# Patient Record
Sex: Male | Born: 1965 | Race: White | Hispanic: No | Marital: Married | Smoking: Current every day smoker
Health system: Southern US, Community
[De-identification: ages and names within clinical notes are randomized; demographics above are authoritative.]

## PROBLEM LIST (undated history)

## (undated) DIAGNOSIS — I10 Essential (primary) hypertension: Secondary | ICD-10-CM

## (undated) DIAGNOSIS — M1990 Unspecified osteoarthritis, unspecified site: Secondary | ICD-10-CM

## (undated) DIAGNOSIS — E785 Hyperlipidemia, unspecified: Secondary | ICD-10-CM

## (undated) DIAGNOSIS — E079 Disorder of thyroid, unspecified: Secondary | ICD-10-CM

## (undated) DIAGNOSIS — E039 Hypothyroidism, unspecified: Secondary | ICD-10-CM

## (undated) DIAGNOSIS — E119 Type 2 diabetes mellitus without complications: Secondary | ICD-10-CM

## (undated) DIAGNOSIS — I639 Cerebral infarction, unspecified: Secondary | ICD-10-CM

## (undated) DIAGNOSIS — M109 Gout, unspecified: Secondary | ICD-10-CM

## (undated) DIAGNOSIS — R270 Ataxia, unspecified: Secondary | ICD-10-CM

## (undated) DIAGNOSIS — G4700 Insomnia, unspecified: Secondary | ICD-10-CM

## (undated) DIAGNOSIS — J189 Pneumonia, unspecified organism: Secondary | ICD-10-CM

## (undated) DIAGNOSIS — F419 Anxiety disorder, unspecified: Secondary | ICD-10-CM

## (undated) DIAGNOSIS — C8590 Non-Hodgkin lymphoma, unspecified, unspecified site: Secondary | ICD-10-CM

## (undated) HISTORY — DX: Hyperlipidemia, unspecified: E78.5

## (undated) HISTORY — PX: WRIST SURGERY: SHX841

## (undated) HISTORY — PX: WISDOM TOOTH EXTRACTION: SHX21

## (undated) HISTORY — PX: NASAL SINUS SURGERY: SHX719

## (undated) HISTORY — PX: COLONOSCOPY: SHX174

## (undated) HISTORY — DX: Insomnia, unspecified: G47.00

## (undated) HISTORY — DX: Essential (primary) hypertension: I10

---

## 2000-08-08 ENCOUNTER — Encounter (INDEPENDENT_AMBULATORY_CARE_PROVIDER_SITE_OTHER): Payer: Self-pay

## 2000-08-08 ENCOUNTER — Other Ambulatory Visit: Admission: RE | Admit: 2000-08-08 | Discharge: 2000-08-08 | Payer: Self-pay | Admitting: Gastroenterology

## 2006-05-02 ENCOUNTER — Ambulatory Visit: Payer: Self-pay | Admitting: Gastroenterology

## 2006-06-12 ENCOUNTER — Ambulatory Visit: Payer: Self-pay | Admitting: Gastroenterology

## 2006-06-13 ENCOUNTER — Ambulatory Visit: Payer: Self-pay | Admitting: Gastroenterology

## 2006-06-13 ENCOUNTER — Encounter: Payer: Self-pay | Admitting: Gastroenterology

## 2008-12-05 ENCOUNTER — Ambulatory Visit: Payer: Self-pay | Admitting: Internal Medicine

## 2008-12-05 DIAGNOSIS — R05 Cough: Secondary | ICD-10-CM

## 2008-12-05 DIAGNOSIS — T7840XA Allergy, unspecified, initial encounter: Secondary | ICD-10-CM | POA: Insufficient documentation

## 2008-12-05 DIAGNOSIS — R079 Chest pain, unspecified: Secondary | ICD-10-CM | POA: Insufficient documentation

## 2008-12-05 DIAGNOSIS — J45901 Unspecified asthma with (acute) exacerbation: Secondary | ICD-10-CM | POA: Insufficient documentation

## 2008-12-05 DIAGNOSIS — Z9189 Other specified personal risk factors, not elsewhere classified: Secondary | ICD-10-CM | POA: Insufficient documentation

## 2008-12-05 DIAGNOSIS — R059 Cough, unspecified: Secondary | ICD-10-CM | POA: Insufficient documentation

## 2008-12-09 ENCOUNTER — Telehealth (INDEPENDENT_AMBULATORY_CARE_PROVIDER_SITE_OTHER): Payer: Self-pay | Admitting: *Deleted

## 2009-01-16 ENCOUNTER — Telehealth (INDEPENDENT_AMBULATORY_CARE_PROVIDER_SITE_OTHER): Payer: Self-pay | Admitting: *Deleted

## 2009-02-25 ENCOUNTER — Telehealth (INDEPENDENT_AMBULATORY_CARE_PROVIDER_SITE_OTHER): Payer: Self-pay | Admitting: *Deleted

## 2009-06-10 ENCOUNTER — Telehealth: Payer: Self-pay | Admitting: Internal Medicine

## 2009-08-04 ENCOUNTER — Telehealth (INDEPENDENT_AMBULATORY_CARE_PROVIDER_SITE_OTHER): Payer: Self-pay | Admitting: *Deleted

## 2011-03-11 NOTE — Assessment & Plan Note (Signed)
[facility]                           GASTROENTEROLOGY OFFICE NOTE   [name], [name]                            MRN:          [id number]  DATE:06/12/2006                            DOB:          21-Feb-1966    PRIMARY CARE PHYSICIAN:  Dr. Cleda Clarks from [facility].   GASTROINTESTINAL PROBLEM LIST:  1. Family history of colon cancer.  Father diagnosed in his 77s.  The      patient has never had colonoscopy.  2. Recent odynophagia, diagnosed as thrush.  Was put on 6 days of      fluconazole with good relief of symptoms, although still some mild      discomfort.   INTERVAL HISTORY:  I last saw Dustin Sampson about 6 weeks ago.  At that time, he had  an acute serum sickness type illness with extreme fatigue, joint pain,  swelling in his extremities.  It was not clear to me whether this was simply  related to a virus or possibly to antibiotics he had been put on.  Either  way his big symptoms did improve shortly after stopping the Avelox.  About 2-  3 weeks ago, though, he started having a sore throat and presented to one of  his caregivers who gave him a Z-Pak.  Shortly after that, he started  noticing more and more swallowing discomfort and throat pain.  He went to  see Dr. Cleda Clarks, who diagnosed him with having oral thrush who put him on 6  days of fluconazole which dramatically helped his swallowing, but he did  notice in the past week or so that he sees a couple white spots again.  He  started taking yogurt, and his symptoms have not worsened since then.  He  has arranged to see a rheumatologist to discuss with him his recent joint  pains plus his history of gout.   CURRENT MEDICATIONS:  1. Restoril.  2. Synthroid.  3. A cholesterol medicine.   PHYSICAL EXAMINATION:  VITAL SIGNS:  Weight is 196 pounds, which is down 15  pounds since his last visit.  Blood pressure of 126/64, pulse 76.  CONSTITUTIONAL:  Generally, well appearing.  LUNGS:  Clear  to auscultation bilaterally.  HEART:  Regular rate and rhythm.  ABDOMEN:  Soft, nontender, nondistended, with normal bowel sounds.  HEENT:  Oropharynx with minor erythema in the posterior oropharynx.  I see  no definite evidence of thrush.   ASSESSMENT AND PLAN:  A 45 year old man with mild odynophagia, family  history for colon cancer.   I do think that his acute illness recently was either viral related or  perhaps related to some antibiotics that he had taken.  He certainly had  thrush, which was very most likely antibiotic related.  I have recommended  he be very cautious about future antibiotic use, unless his care Jerricka Carvey is  relatively certain that he has a bacterial infection.  Currently, he still  does have minor odynophagia without any definite evidence of thrush in his  oropharynx.  I think proceeding with EGD at his soonest  convenience is  reasonable just to see if he has esophageal candidiasis.  He also has a  family history for colon cancer, so at the same time we will do a full  colonoscopy.  I see no reason for any other blood tests or imaging studies  prior to then.                                   Rachael Fee, MD   DPJ/MedQ  DD:  06/12/2006  DT:  06/12/2006  Job #:  [id number]   cc:   Dr. Cleda Clarks

## 2011-03-11 NOTE — Assessment & Plan Note (Signed)
[facility]                           GASTROENTEROLOGY OFFICE NOTE   ZARIUS, FURR                            MRN:          [id number]  DATE:05/02/2006                            DOB:          1966/03/07    REFERRING PHYSICIAN:  RICHARD A KEEVER   REFERRING PHYSICIAN:  Dr. Cleda Clarks from [facility].   REASON FOR REFERRAL:  Dr. Cleda Clarks asked me to evaluate Mr. Givler in  consultation regarding nausea, vomiting, chills, fatigue.   HISTORY OF PRESENT ILLNESS:  Mr. Dorn is a very pleasant 45 year old man  who has had multiple new symptoms develop over the past 2 weeks.  He was at  a funeral in  approximately 2 weeks ago and had a sore throat  and began feeling achy and quite fatigued.  Following that, he went to  [facility] with his wife and he started noticing some right upper quadrant  discomfort, chills and fatigue and then had 1 day of dramatic diarrhea.  He  went to see a local physician there in [place] and was given Avelox  empirically for what he was told was possibly bronchitis.  He was coughing a  bit at the time, but nothing dramatic.  Since starting the Avelox, he was  nauseated and he noticed his hands and feet swelling a lot.  He stopped the  Avelox after 6 days and saw his caregiver at [facility], who  did some lab tests on him of which the CBC shows his hemoglobin is 12.5,  normal white count, no elevated lymphocytes, normal platelets.   His biggest complaints now are his hands and feet swelling, his joint pains  and his fatigue.  He thinks since stopping the Avelox that his nausea has  improved and his hands and feet maybe are a bit less swollen today.   Of interest, he had a Staph aureus skin infection at his left ear about a  month ago and he completed 10 days of 2 different antibiotics.   REVIEW OF SYSTEMS:  Review of systems is notable for stable weight, but  otherwise is  unrevealing, except as noted above and as available on his  nursing intake sheet.   PAST MEDICAL HISTORY:  1.  Status post colonoscopy, 2001, hyperplastic polyps taken; this was done      for a family history of colon cancer (his father).  2.  Hiatal hernia.  3.  Chronic headaches.  4.  Chronic sinus disease.  5.  Recent left ear Staph aureus infection.   CURRENT MEDICINES:  Restoril.   ALLERGIES:  PENICILLIN and ASPIRIN.   SOCIAL HISTORY:  Married with 6 kids, a 27-year-old and a 41-year-old, works  as a Comptroller at [employer], smokes 1-1/2 packs a day, nondrinker.   FAMILY HISTORY:  Father diagnosed with colon cancer in his 58s.  Mother with  ulcerative colitis.  Father and sister with celiac disease.   PHYSICAL EXAMINATION:  VITAL SIGNS:  Six feet 0 inches, 209 pounds.  Blood  pressure 132/72, pulse 80.  CONSTITUTIONAL:  Generally  well-appearing.  NEUROLOGIC:  Alert and oriented x3.  EYES:  Extraocular movements intact.  MOUTH:  Oropharynx moist.  No lesions.  NECK:  Supple with no lymphadenopathy.  CARDIOVASCULAR:  Heart -- regular rate and rhythm.  LUNGS:  Clear to auscultation bilaterally.  ABDOMEN:  Soft, nontender and non-distended.  Normal bowel sounds.  EXTREMITIES:  No lower extremity edema.  SKIN:  No rash or lesions of his lower extremities.   ASSESSMENT AND PLAN:  [age]-year-old man with multiple symptoms.   I suspect that Mr. Garofano has had a viral illness and many of his symptoms  are probably related to that.  Also, he was given empiric antibiotics for  bronchitis during this course and that probably contributed to his nausea or  perhaps caused it completely.  His wife had similar symptoms starting around  the same time as him, but she resolved after just 4 or 5 days.  Neither of  his kids have gotten sick.  I have a CBC available from yesterday's  laboratories showing that his white count was normal; his hemoglobin was  12.5, which is down from a test  done last year, but is still in the normal  range.  He has no signs of overt gastrointestinal bleeding.   I recommend that he begin taking Extra-Strength Tylenol two pills twice  daily to help with his joint pains.  He is going to get a complete metabolic  profile done today as well as celiac sprue test done; I doubt that this is  acute celiac sprue disease, but will check just to be sure.  He is due for  screening colonoscopy, since his father had colon cancer.  I do not think  right now is the best time for this, since he has this acute illness, but he  will return to see me in 2 weeks' time, sooner if needed.  If he is feeling  better then, then we will readdress the issue of screening colonoscopy and  arrange for that to be done as soon as convenient.  I am also going to get  stool studies done today, since he was on antibiotics about a month ago and  even more  recently and he has had some diarrhea during this, although his diarrhea  does not sound very dramatic; he has had no nocturnal symptoms.                                  Rachael Fee, MD   DPJ/MedQ  DD:  05/02/2006  DT:  05/03/2006  Job #:  [id number]   cc:   [facility] Keever, Richard A. MD

## 2011-06-23 ENCOUNTER — Encounter: Payer: Self-pay | Admitting: Gastroenterology

## 2011-07-06 ENCOUNTER — Emergency Department (HOSPITAL_COMMUNITY): Payer: No Typology Code available for payment source

## 2011-07-06 ENCOUNTER — Emergency Department (HOSPITAL_COMMUNITY)
Admission: EM | Admit: 2011-07-06 | Discharge: 2011-07-06 | Disposition: A | Discharge status: Home / Self Care | Payer: No Typology Code available for payment source | Attending: Emergency Medicine | Admitting: Emergency Medicine

## 2011-07-06 DIAGNOSIS — R51 Headache: Secondary | ICD-10-CM | POA: Insufficient documentation

## 2011-07-06 DIAGNOSIS — E039 Hypothyroidism, unspecified: Secondary | ICD-10-CM | POA: Insufficient documentation

## 2011-07-06 DIAGNOSIS — R0789 Other chest pain: Secondary | ICD-10-CM | POA: Insufficient documentation

## 2011-07-06 DIAGNOSIS — F29 Unspecified psychosis not due to a substance or known physiological condition: Secondary | ICD-10-CM | POA: Insufficient documentation

## 2011-07-06 DIAGNOSIS — Z79899 Other long term (current) drug therapy: Secondary | ICD-10-CM | POA: Insufficient documentation

## 2011-07-06 DIAGNOSIS — S0990XA Unspecified injury of head, initial encounter: Secondary | ICD-10-CM | POA: Insufficient documentation

## 2012-07-26 ENCOUNTER — Encounter: Payer: Self-pay | Admitting: Gastroenterology

## 2014-01-21 ENCOUNTER — Other Ambulatory Visit: Payer: Self-pay | Admitting: Otolaryngology

## 2014-11-20 ENCOUNTER — Other Ambulatory Visit: Payer: Self-pay | Admitting: Family Medicine

## 2014-11-20 ENCOUNTER — Encounter: Payer: Self-pay | Admitting: Gastroenterology

## 2014-11-20 DIAGNOSIS — R198 Other specified symptoms and signs involving the digestive system and abdomen: Secondary | ICD-10-CM

## 2014-12-05 ENCOUNTER — Ambulatory Visit
Admission: RE | Admit: 2014-12-05 | Discharge: 2014-12-05 | Disposition: A | Discharge status: Home / Self Care | Payer: 59 | Source: Ambulatory Visit | Attending: Family Medicine | Admitting: Family Medicine

## 2014-12-05 DIAGNOSIS — R198 Other specified symptoms and signs involving the digestive system and abdomen: Secondary | ICD-10-CM

## 2014-12-25 ENCOUNTER — Ambulatory Visit (AMBULATORY_SURGERY_CENTER): Payer: Self-pay

## 2014-12-25 VITALS — Ht 72.0 in | Wt 221.0 lb

## 2014-12-25 DIAGNOSIS — Z860100 Personal history of colon polyps, unspecified: Secondary | ICD-10-CM

## 2014-12-25 DIAGNOSIS — Z800 Family history of malignant neoplasm of digestive organs: Secondary | ICD-10-CM

## 2014-12-25 MED ORDER — MOVIPREP 100 G PO SOLR: 1.0000 | Freq: Once | ORAL | Status: DC

## 2014-12-25 NOTE — Progress Notes (Signed)
No allergies to eggs or soy No diet/weight loss meds No home oxygen No past problems with anesthesia  Has email  Emmi instructions given for colonoscopy 

## 2015-01-07 ENCOUNTER — Ambulatory Visit (AMBULATORY_SURGERY_CENTER): Payer: 59 | Admitting: Gastroenterology

## 2015-01-07 ENCOUNTER — Encounter: Payer: Self-pay | Admitting: Gastroenterology

## 2015-01-07 VITALS — BP 134/85 | HR 75 | Temp 97.4°F | Resp 21 | Ht 72.0 in | Wt 221.0 lb

## 2015-01-07 DIAGNOSIS — D122 Benign neoplasm of ascending colon: Secondary | ICD-10-CM | POA: Diagnosis not present

## 2015-01-07 DIAGNOSIS — Z800 Family history of malignant neoplasm of digestive organs: Secondary | ICD-10-CM

## 2015-01-07 DIAGNOSIS — Z1211 Encounter for screening for malignant neoplasm of colon: Secondary | ICD-10-CM

## 2015-01-07 DIAGNOSIS — Z86010 Personal history of colonic polyps: Secondary | ICD-10-CM

## 2015-01-07 MED ORDER — SODIUM CHLORIDE 0.9 % IV SOLN: 500.0000 mL | INTRAVENOUS | Status: DC

## 2015-01-07 NOTE — Progress Notes (Signed)
Called to room to assist during endoscopic procedure.  Patient ID and intended procedure confirmed with present staff. Received instructions for my participation in the procedure from the performing physician.  

## 2015-01-07 NOTE — Patient Instructions (Signed)
YOU HAD AN ENDOSCOPIC PROCEDURE TODAY AT [facility]:   Refer to the procedure report that was given to you for any specific questions about what was found during the examination.  If the procedure report does not answer your questions, please call your gastroenterologist to clarify.  If you requested that your care partner not be given the details of your procedure findings, then the procedure report has been included in a sealed envelope for you to review at your convenience later.  YOU SHOULD EXPECT: Some feelings of bloating in the abdomen. Passage of more gas than usual.  Walking can help get rid of the air that was put into your GI tract during the procedure and reduce the bloating. If you had a lower endoscopy (such as a colonoscopy or flexible sigmoidoscopy) you may notice spotting of blood in your stool or on the toilet paper. If you underwent a bowel prep for your procedure, you may not have a normal bowel movement for a few days.  Please Note:  You might notice some irritation and congestion in your nose or some drainage.  This is from the oxygen used during your procedure.  There is no need for concern and it should clear up in a day or so.  SYMPTOMS TO REPORT IMMEDIATELY:   Following lower endoscopy (colonoscopy or flexible sigmoidoscopy):  Excessive amounts of blood in the stool  Significant tenderness or worsening of abdominal pains  Swelling of the abdomen that is new, acute  Fever of 100F or higher    For urgent or emergent issues, a gastroenterologist can be reached at any hour by calling [phone].   DIET: Your first meal following the procedure should be a small meal and then it is ok to progress to your normal diet. Heavy or fried foods are harder to digest and may make you feel nauseous or bloated.  Likewise, meals heavy in dairy and vegetables can increase bloating.  Drink plenty of fluids but you should avoid alcoholic beverages for 24  hours.  ACTIVITY:  You should plan to take it easy for the rest of today and you should NOT DRIVE or use heavy machinery until tomorrow (because of the sedation medicines used during the test).    FOLLOW UP: Our staff will call the number listed on your records the next business day following your procedure to check on you and address any questions or concerns that you may have regarding the information given to you following your procedure. If we do not reach you, we will leave a message.  However, if you are feeling well and you are not experiencing any problems, there is no need to return our call.  We will assume that you have returned to your regular daily activities without incident.   Polyp information given.  If any biopsies were taken you will be contacted by phone or by letter within the next 1-3 weeks.  Please call us at [phone] if you have not heard about the biopsies in 3 weeks.    SIGNATURES/CONFIDENTIALITY: You and/or your care partner have signed paperwork which will be entered into your electronic medical record.  These signatures attest to the fact that that the information above on your After Visit Summary has been reviewed and is understood.  Full responsibility of the confidentiality of this discharge information lies with you and/or your care-partner.

## 2015-01-07 NOTE — Op Note (Signed)
[facility]  [address] [place], [id number]   COLONOSCOPY PROCEDURE REPORT  PATIENT: Dustin Sampson, Dustin Sampson  MR#: [id number] BIRTHDATE: [birth year] , 48  yrs. old GENDER: male ENDOSCOPIST: Milus Banister, MD PROCEDURE DATE:  01/07/2015 PROCEDURE:   Colonoscopy with snare polypectomy First Screening Colonoscopy - Avg.  risk and is 50 yrs.  old or older - No.  Prior Negative Screening - Now for repeat screening. N/A  History of Adenoma - Now for follow-up colonoscopy & has been > or = to 3 yrs.  N/A ASA CLASS:   Class II INDICATIONS:Screening for colonic neoplasia and FH Colon or Rectal Adenocarcinoma (father had colon cancer in his 54's) MEDICATIONS: Monitored anesthesia care and Propofol 400 mg IV  DESCRIPTION OF PROCEDURE:   After the risks benefits and alternatives of the procedure were thoroughly explained, informed consent was obtained.  The digital rectal exam revealed no abnormalities of the rectum.   The LB [id number]  endoscope was introduced through the anus and advanced to the cecum, which was identified by both the appendix and ileocecal valve. No adverse events experienced.   The quality of the prep was excellent.  The instrument was then slowly withdrawn as the colon was fully examined.   COLON FINDINGS: Two sessile polyps ranging between 3-9mm in size were found in the ascending colon.  Polypectomies were performed with cold forceps.  The resection was complete, the polyp tissue was completely retrieved and sent to histology.   The examination was otherwise normal.  Retroflexed views revealed no abnormalities. The time to cecum = 0.4 Withdrawal time = 8.4   The scope was withdrawn and the procedure completed. COMPLICATIONS: There were no immediate complications.  ENDOSCOPIC IMPRESSION: 1.   Two sessile polyps ranging between 3-47mm in size were found in the ascending colon; polypectomies were performed with cold forceps  2.   The examination was  otherwise normal  RECOMMENDATIONS: If the polyp(s) removed today are proven to be adenomatous (pre-cancerous) polyps, you will need a repeat colonoscopy in 5 years.  You will receive a letter within 1-2 weeks with the results of your biopsy as well as final recommendations.  Please call my office if you have not received a letter after 3 weeks.  eSigned:  Milus Banister, MD 01/07/2015 10:54 AM   cc: Kathyrn Lass, MD

## 2015-01-07 NOTE — Progress Notes (Signed)
A/ox3 pleased with MAC, report to Jane RN 

## 2015-01-08 ENCOUNTER — Telehealth: Payer: Self-pay | Admitting: *Deleted

## 2015-01-08 NOTE — Telephone Encounter (Signed)
No answer, message left for the patient. 

## 2015-01-13 ENCOUNTER — Encounter: Payer: Self-pay | Admitting: Gastroenterology

## 2015-10-25 ENCOUNTER — Emergency Department (HOSPITAL_COMMUNITY)
Admission: EM | Admit: 2015-10-25 | Discharge: 2015-10-25 | Disposition: A | Discharge status: Home / Self Care | Payer: 59 | Source: Home / Self Care

## 2015-10-25 ENCOUNTER — Encounter (HOSPITAL_COMMUNITY): Payer: Self-pay | Admitting: *Deleted

## 2015-10-25 DIAGNOSIS — J40 Bronchitis, not specified as acute or chronic: Secondary | ICD-10-CM

## 2015-10-25 HISTORY — DX: Disorder of thyroid, unspecified: E07.9

## 2015-10-25 MED ORDER — AZITHROMYCIN 250 MG PO TABS: ORAL_TABLET | ORAL | Status: DC

## 2015-10-25 MED ORDER — PREDNISONE 10 MG PO TABS: 20.0000 mg | ORAL_TABLET | Freq: Every day | ORAL | Status: DC

## 2015-10-25 NOTE — ED Provider Notes (Signed)
CSN: [id number]     Arrival date & time 10/25/15  1549 History   None    Chief Complaint  Patient presents with  . Cough  . Fever   (Consider location/radiation/quality/duration/timing/severity/associated sxs/prior Treatment) HPI Cough, dry hacking now for 1 week. Not getting better. Now with wheezing. No associated fever. OTC meds not helping no pain score Exposed to sick co-workers.  Past Medical History  Diagnosis Date  . Hypertension   . Hyperlipemia   . Insomnia   . Thyroid disease    Past Surgical History  Procedure Laterality Date  . Wisdom tooth extraction    . Colonoscopy      x2  . Nasal sinus surgery     Family History  Problem Relation Age of Onset  . Colon cancer Father    Social History  Substance Use Topics  . Smoking status: Current Every Day Smoker -- 0.50 packs/day    Types: Cigarettes  . Smokeless tobacco: Former Systems developer  . Alcohol Use: No    Review of Systems ROS +'ve cough, wheezing  Denies: HEADACHE, NAUSEA, ABDOMINAL PAIN, CHEST PAIN, CONGESTION, DYSURIA, SHORTNESS OF BREATH  Allergies  Aspirin; Avelox; and Penicillins  Home Medications   Prior to Admission medications   Medication Sig Start Date End Date Taking? Authorizing Provider  atorvastatin (LIPITOR) 10 MG tablet Take 10 mg by mouth daily.   Yes Historical Provider, MD  levothyroxine (SYNTHROID, LEVOTHROID) 200 MCG tablet Take 200 mcg by mouth daily before breakfast.   Yes Historical Provider, MD  losartan (COZAAR) 50 MG tablet Take 50 mg by mouth daily.   Yes Historical Provider, MD  temazepam (RESTORIL) 30 MG capsule Take 30 mg by mouth at bedtime as needed for sleep.   Yes Historical Provider, MD  azithromycin (ZITHROMAX) 250 MG tablet Take first 2 tablets together, then 1 every day until finished. 10/25/15   Konrad Felix, PA  predniSONE (DELTASONE) 10 MG tablet Take 2 tablets (20 mg total) by mouth daily. 10/25/15   Konrad Felix, PA   Meds Ordered and Administered this Visit   Medications - No data to display  BP 146/96 mmHg  Pulse 95  Temp(Src) 97.6 F (36.4 C) (Oral)  Resp 18  SpO2 98% No data found.   Physical Exam NURSES NOTES AND VITAL SIGNS REVIEWED. CONSTITUTIONAL: Well developed, well nourished, no acute distress HEENT: normocephalic, atraumatic EYES: Conjunctiva normal NECK:normal ROM, supple PULMONARY:No respiratory distress, normal effort, Lungs: CTAb/l very few diffuse wheezes CARDIOVASCULAR: RRR, no murmur ABDOMEN: soft, ND, NT, +'ve BS MUSCULOSKELETAL: Normal ROM of all extremities SKIN: warm and dry without rash PSYCHIATRIC: Mood and affect normal  ED Course  Procedures (including critical care time)  Labs Review Labs Reviewed - No data to display  Imaging Review No results found.   Visual Acuity Review  Right Eye Distance:   Left Eye Distance:   Bilateral Distance:    Right Eye Near:   Left Eye Near:    Bilateral Near:         MDM   1. Bronchitis    Patient is advised to continue home symptomatic treatment. Prescription for zithromax  sent pharmacy patient has indicated. Patient is advised that if there are new or worsening symptoms or attend the emergency department, or contact primary care provider. Instructions of care provided discharged home in stable condition.  THIS NOTE WAS GENERATED USING A VOICE RECOGNITION SOFTWARE PROGRAM. ALL REASONABLE EFFORTS  WERE MADE TO PROOFREAD THIS DOCUMENT FOR ACCURACY.  Konrad Felix, [place] 10/25/15 1910

## 2015-10-25 NOTE — ED Notes (Signed)
C/O "bad cough", chest pain with coughing, chills and feeling feverish x 9 days with progressive worsening.  Has been taking Mucinex and Nyquil.

## 2015-10-25 NOTE — Discharge Instructions (Signed)
Upper Respiratory Infection, Adult Most upper respiratory infections (URIs) are caused by a virus. A URI affects the nose, throat, and upper air passages. The most common type of URI is often called "the common cold." HOME CARE   Take medicines only as told by your doctor.  Gargle warm saltwater or take cough drops to comfort your throat as told by your doctor.  Use a warm mist humidifier or inhale steam from a shower to increase air moisture. This may make it easier to breathe.  Drink enough fluid to keep your pee (urine) clear or pale yellow.  Eat soups and other clear broths.  Have a healthy diet.  Rest as needed.  Go back to work when your fever is gone or your doctor says it is okay.  You may need to stay home longer to avoid giving your URI to others.  You can also wear a face mask and wash your hands often to prevent spread of the virus.  Use your inhaler more if you have asthma.  Do not use any tobacco products, including cigarettes, chewing tobacco, or electronic cigarettes. If you need help quitting, ask your doctor. GET HELP IF:  You are getting worse, not better.  Your symptoms are not helped by medicine.  You have chills.  You are getting more short of breath.  You have brown or red mucus.  You have yellow or brown discharge from your nose.  You have pain in your face, especially when you bend forward.  You have a fever.  You have puffy (swollen) neck glands.  You have pain while swallowing.  You have white areas in the back of your throat. GET HELP RIGHT AWAY IF:   You have very bad or constant:  Headache.  Ear pain.  Pain in your forehead, behind your eyes, and over your cheekbones (sinus pain).  Chest pain.  You have long-lasting (chronic) lung disease and any of the following:  Wheezing.  Long-lasting cough.  Coughing up blood.  A change in your usual mucus.  You have a stiff neck.  You have changes in  your:  Vision.  Hearing.  Thinking.  Mood. MAKE SURE YOU:   Understand these instructions.  Will watch your condition.  Will get help right away if you are not doing well or get worse.   This information is not intended to replace advice given to you by your health care provider. Make sure you discuss any questions you have with your health care provider.   Document Released: 03/28/2008 Document Revised: 02/24/2015 Document Reviewed: 01/15/2014 Elsevier Interactive Patient Education 2016 Elsevier Inc. Acute Bronchitis Bronchitis is when the airways that extend from the windpipe into the lungs get red, puffy, and painful (inflamed). Bronchitis often causes thick spit (mucus) to develop. This leads to a cough. A cough is the most common symptom of bronchitis. In acute bronchitis, the condition usually begins suddenly and goes away over time (usually in 2 weeks). Smoking, allergies, and asthma can make bronchitis worse. Repeated episodes of bronchitis may cause more lung problems. HOME CARE  Rest.  Drink enough fluids to keep your pee (urine) clear or pale yellow (unless you need to limit fluids as told by your doctor).  Only take over-the-counter or prescription medicines as told by your doctor.  Avoid smoking and secondhand smoke. These can make bronchitis worse. If you are a smoker, think about using nicotine gum or skin patches. Quitting smoking will help your lungs heal faster.  Reduce the  chance of getting bronchitis again by:  Washing your hands often.  Avoiding people with cold symptoms.  Trying not to touch your hands to your mouth, nose, or eyes.  Follow up with your doctor as told. GET HELP IF: Your symptoms do not improve after 1 week of treatment. Symptoms include:  Cough.  Fever.  Coughing up thick spit.  Body aches.  Chest congestion.  Chills.  Shortness of breath.  Sore throat. GET HELP RIGHT AWAY IF:   You have an increased fever.  You  have chills.  You have severe shortness of breath.  You have bloody thick spit (sputum).  You throw up (vomit) often.  You lose too much body fluid (dehydration).  You have a severe headache.  You faint. MAKE SURE YOU:   Understand these instructions.  Will watch your condition.  Will get help right away if you are not doing well or get worse.   This information is not intended to replace advice given to you by your health care provider. Make sure you discuss any questions you have with your health care provider.   Document Released: 03/28/2008 Document Revised: 06/12/2013 Document Reviewed: 04/02/2013 Elsevier Interactive Patient Education [employer].

## 2016-11-01 DIAGNOSIS — I10 Essential (primary) hypertension: Secondary | ICD-10-CM | POA: Diagnosis not present

## 2016-11-01 DIAGNOSIS — G4700 Insomnia, unspecified: Secondary | ICD-10-CM | POA: Diagnosis not present

## 2016-11-01 DIAGNOSIS — E039 Hypothyroidism, unspecified: Secondary | ICD-10-CM | POA: Diagnosis not present

## 2017-01-31 DIAGNOSIS — M10071 Idiopathic gout, right ankle and foot: Secondary | ICD-10-CM | POA: Diagnosis not present

## 2017-02-23 DIAGNOSIS — M79672 Pain in left foot: Secondary | ICD-10-CM | POA: Diagnosis not present

## 2017-02-23 DIAGNOSIS — E039 Hypothyroidism, unspecified: Secondary | ICD-10-CM | POA: Diagnosis not present

## 2017-02-23 DIAGNOSIS — K1379 Other lesions of oral mucosa: Secondary | ICD-10-CM | POA: Diagnosis not present

## 2017-03-03 DIAGNOSIS — M109 Gout, unspecified: Secondary | ICD-10-CM | POA: Diagnosis not present

## 2017-03-14 DIAGNOSIS — M109 Gout, unspecified: Secondary | ICD-10-CM | POA: Diagnosis not present

## 2017-03-21 DIAGNOSIS — M25571 Pain in right ankle and joints of right foot: Secondary | ICD-10-CM | POA: Diagnosis not present

## 2017-04-04 DIAGNOSIS — M109 Gout, unspecified: Secondary | ICD-10-CM | POA: Diagnosis not present

## 2017-05-16 DIAGNOSIS — R7303 Prediabetes: Secondary | ICD-10-CM | POA: Diagnosis not present

## 2017-05-16 DIAGNOSIS — E039 Hypothyroidism, unspecified: Secondary | ICD-10-CM | POA: Diagnosis not present

## 2017-05-16 DIAGNOSIS — R51 Headache: Secondary | ICD-10-CM | POA: Diagnosis not present

## 2017-05-16 DIAGNOSIS — M1A9XX0 Chronic gout, unspecified, without tophus (tophi): Secondary | ICD-10-CM | POA: Diagnosis not present

## 2017-07-13 DIAGNOSIS — M2550 Pain in unspecified joint: Secondary | ICD-10-CM | POA: Diagnosis not present

## 2017-07-13 DIAGNOSIS — M1A09X0 Idiopathic chronic gout, multiple sites, without tophus (tophi): Secondary | ICD-10-CM | POA: Diagnosis not present

## 2017-08-22 ENCOUNTER — Encounter: Payer: Self-pay | Admitting: Neurology

## 2017-08-23 ENCOUNTER — Ambulatory Visit (INDEPENDENT_AMBULATORY_CARE_PROVIDER_SITE_OTHER): Payer: 59 | Admitting: Neurology

## 2017-08-23 ENCOUNTER — Encounter: Payer: Self-pay | Admitting: Neurology

## 2017-08-23 VITALS — BP 150/100 | HR 94 | Ht 72.0 in | Wt 235.4 lb

## 2017-08-23 DIAGNOSIS — F17200 Nicotine dependence, unspecified, uncomplicated: Secondary | ICD-10-CM | POA: Diagnosis not present

## 2017-08-23 DIAGNOSIS — I10 Essential (primary) hypertension: Secondary | ICD-10-CM

## 2017-08-23 DIAGNOSIS — G4440 Drug-induced headache, not elsewhere classified, not intractable: Secondary | ICD-10-CM | POA: Diagnosis not present

## 2017-08-23 MED ORDER — NORTRIPTYLINE HCL 25 MG PO CAPS: 25.0000 mg | ORAL_CAPSULE | Freq: Every day | ORAL | 3 refills | Status: DC

## 2017-08-23 NOTE — Patient Instructions (Signed)
1.  We will taper off of Fioricet:  Week 1:  Take no more than 6 days out of the week  Week 2:  Take no more than 5 days out of the week  Week 3:  Take no more than 4 days out of the week  Week 4:  Take no more than 3 days out of the week  Week 5.  Take no more than 2 days out of the week  Week 6.  Take no more than 1 day out of the week  Week 7:  Stop all Fioricet 2.  Stop caffeine, Excedrin.   3.  Limit use of Advil to no more than 2 days out of the week 4.  Start nortriptyline 25mg  at bedtime.  Contact me in 4 weeks and we can increase dose if needed.  Otherwise you may refill the current dose. 5.  Increase exercise (at least 4 days out of the week), increase water intake, improve sleep hygiene, quit smoking 6. Follow up in 3 months.

## 2017-08-23 NOTE — Progress Notes (Signed)
NEUROLOGY CONSULTATION NOTE  Dustin Sampson MRN: [id number] DOB: 08/27/1966  Referring provider: Dr. Lynelle Doctor Primary care provider: Dr. Lynelle Doctor  Reason for consult:  headache  HISTORY OF PRESENT ILLNESS: Dustin Sampson is a 51 year old male with hypertension, hyperlipidemia and hypothyroidism who presents for headache.  He has had chronic headaches all of his life.  He reports history of migraines, tension type headache and "sinus" headaches.  About 2 years ago, he underwent sinus surgery to correct a deviated septum.  He currently reports a constant moderate pressure headache (sometimes throbbing) located above either eye (right more than left) and back of the head.  He has associated neck tightness.  It is constant.  On only rare occasions is there nausea.  Otherwise, no nausea, vomiting, photophobia, phonophobia or visual disturbance.  Alcohol is a trigger. Nothing really relieves it except for pain relievers.    He currently takes Fioricet twice daily (for 20 years).  He also takes Advil daily (to treat headache as well as joint pain).  He drinks caffeine every morning and will at times take Excedrin as well.  He also takes an intranasal decongestant.    He has a history of poor sleep and takes Restoril.  He does not feel fatigued during the day.  His wife says he does not snore much.  He is a cigarette smoker.  He does not drink alcohol.  He does not exercise routinely.  He says he hydrates with water during the day.    His mother had headaches.  He has never been on a daily preventative.  PAST MEDICAL HISTORY: Past Medical History:  Diagnosis Date  . Hyperlipemia   . Hypertension   . Insomnia   . Thyroid disease     PAST SURGICAL HISTORY: Past Surgical History:  Procedure Laterality Date  . COLONOSCOPY     x2  . NASAL SINUS SURGERY    . WISDOM TOOTH EXTRACTION      MEDICATIONS: Current Outpatient Prescriptions on File Prior to Visit  Medication Sig Dispense Refill  . atorvastatin  (LIPITOR) 10 MG tablet Take 10 mg by mouth daily.    Marland Kitchen azithromycin (ZITHROMAX) 250 MG tablet Take first 2 tablets together, then 1 every day until finished. (Patient not taking: Reported on 08/23/2017) 6 tablet 0  . levothyroxine (SYNTHROID, LEVOTHROID) 200 MCG tablet Take 200 mcg by mouth daily before breakfast.    . losartan (COZAAR) 50 MG tablet Take 50 mg by mouth daily.    . predniSONE (DELTASONE) 10 MG tablet Take 2 tablets (20 mg total) by mouth daily. 20 tablet 0  . temazepam (RESTORIL) 30 MG capsule Take 30 mg by mouth at bedtime as needed for sleep.     No current facility-administered medications on file prior to visit.     ALLERGIES: Allergies  Allergen Reactions  . 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 Avelox [Moxifloxacin Hcl In Nacl] Nausea And Vomiting    "made me real sick"  . Penicillins     REACTION: severe GI upset    FAMILY HISTORY: Family History  Problem Relation Age of Onset  . Colon cancer Father     SOCIAL HISTORY: Social History   Social History  . Marital status: Married    Spouse name: N/A  . Number of children: 2  . Years of education: BS   Occupational History  . Arts development officer    Social History Main Topics  . Smoking  status: Current Every Day Smoker    Packs/day: 0.50    Types: Cigarettes  . Smokeless tobacco: Former Systems developer  . Alcohol use No  . Drug use: No  . Sexual activity: Not on file   Other Topics Concern  . Not on file   Social History Narrative   Married, lives with wife and two teenage children. He and his sister alternate with their parents living with them every other month. His parents are in their upper 42's. He drinks 4-5 cups of coffee a day and 2-3 sodas or glasses of tea. He does not get any regular exercise.    REVIEW OF SYSTEMS: Constitutional: No fevers, chills, or sweats, no generalized fatigue, change in appetite Eyes: No visual changes, double vision, eye pain Ear, nose  and throat: No hearing loss, ear pain, nasal congestion, sore throat Cardiovascular: No chest pain, palpitations Respiratory:  No shortness of breath at rest or with exertion, wheezes GastrointestinaI: No nausea, vomiting, diarrhea, abdominal pain, fecal incontinence Genitourinary:  No dysuria, urinary retention or frequency Musculoskeletal:  No neck pain, back pain Integumentary: No rash, pruritus, skin lesions Neurological: as above Psychiatric: No depression, insomnia, anxiety Endocrine: No palpitations, fatigue, diaphoresis, mood swings, change in appetite, change in weight, increased thirst Hematologic/Lymphatic:  No purpura, petechiae. Allergic/Immunologic: no itchy/runny eyes, nasal congestion, recent allergic reactions, rashes  PHYSICAL EXAM: Vitals:   08/23/17 0917  BP: (!) 150/100  Pulse: 94  SpO2: 98%   General: No acute distress.  Patient appears well-groomed.  Head:  Normocephalic/atraumatic Eyes:  fundi examined but not visualized Neck: supple, no paraspinal tenderness, full range of motion Back: No paraspinal tenderness Heart: regular rate and rhythm Lungs: Clear to auscultation bilaterally. Vascular: No carotid bruits. Neurological Exam: Mental status: alert and oriented to person, place, and time, recent and remote memory intact, fund of knowledge intact, attention and concentration intact, speech fluent and not dysarthric, language intact. Cranial nerves: CN I: not tested CN II: pupils equal, round and reactive to light, visual fields intact CN III, IV, VI:  full range of motion, no nystagmus, no ptosis CN V: facial sensation intact CN VII: upper and lower face symmetric CN VIII: hearing intact CN IX, X: gag intact, uvula midline CN XI: sternocleidomastoid and trapezius muscles intact CN XII: tongue midline Bulk & Tone: normal, no fasciculations. Motor:  5/5 throughout  Sensation: temperature and vibration sensation intact. Deep Tendon Reflexes:  2+  throughout, toes downgoing.  Finger to nose testing:  Without dysmetria.  Heel to shin:  Without dysmetria.  Gait:  Normal station and stride.  Able to turn and tandem walk. Romberg negative.  IMPRESSION: Medication-overuse headache HTN Cigarette smoker  PLAN: 1.  Taper off of Fioricet by one day every week.  He is to stop other pain relievers immediately.  I informed him that headaches may get worse before they improve. 2.  He may still use Advil as needed, but limited to no more than 2 days out of the week. 3.  Start nortriptyline 25mg  at bedtime 4.  Discussed lifestyle modification:  Increase exercise, hydration, diet/weight loss and improve sleep hygiene. 5. Smoking cessation instruction/counseling given:  counseled patient on the dangers of tobacco use, advised patient to stop smoking, and reviewed strategies to maximize success. 6.  Follow up blood pressure with PCP. 7.  Follow up in 3 months.  45 minutes spent face to face with patient, over 50% spent discussing management.   Thank you for allowing me to take part in  the care of this patient.  Metta Clines, DO  CC:  Dineen Kid, MD

## 2017-08-28 DIAGNOSIS — M1A09X0 Idiopathic chronic gout, multiple sites, without tophus (tophi): Secondary | ICD-10-CM | POA: Diagnosis not present

## 2017-08-28 DIAGNOSIS — M2550 Pain in unspecified joint: Secondary | ICD-10-CM | POA: Diagnosis not present

## 2017-11-30 DIAGNOSIS — R7303 Prediabetes: Secondary | ICD-10-CM | POA: Diagnosis not present

## 2017-11-30 DIAGNOSIS — G4700 Insomnia, unspecified: Secondary | ICD-10-CM | POA: Diagnosis not present

## 2017-11-30 DIAGNOSIS — E039 Hypothyroidism, unspecified: Secondary | ICD-10-CM | POA: Diagnosis not present

## 2017-11-30 DIAGNOSIS — E782 Mixed hyperlipidemia: Secondary | ICD-10-CM | POA: Diagnosis not present

## 2017-11-30 DIAGNOSIS — I10 Essential (primary) hypertension: Secondary | ICD-10-CM | POA: Diagnosis not present

## 2017-12-05 ENCOUNTER — Ambulatory Visit: Payer: 59 | Admitting: Neurology

## 2017-12-06 ENCOUNTER — Ambulatory Visit: Payer: 59 | Admitting: Neurology

## 2018-05-10 DIAGNOSIS — I10 Essential (primary) hypertension: Secondary | ICD-10-CM | POA: Diagnosis not present

## 2018-05-10 DIAGNOSIS — E1165 Type 2 diabetes mellitus with hyperglycemia: Secondary | ICD-10-CM | POA: Diagnosis not present

## 2018-05-10 DIAGNOSIS — E782 Mixed hyperlipidemia: Secondary | ICD-10-CM | POA: Diagnosis not present

## 2018-05-10 DIAGNOSIS — E119 Type 2 diabetes mellitus without complications: Secondary | ICD-10-CM | POA: Diagnosis not present

## 2018-05-10 DIAGNOSIS — E039 Hypothyroidism, unspecified: Secondary | ICD-10-CM | POA: Diagnosis not present

## 2018-06-02 DIAGNOSIS — K121 Other forms of stomatitis: Secondary | ICD-10-CM | POA: Diagnosis not present

## 2018-06-02 DIAGNOSIS — B370 Candidal stomatitis: Secondary | ICD-10-CM | POA: Diagnosis not present

## 2018-06-30 DIAGNOSIS — M109 Gout, unspecified: Secondary | ICD-10-CM | POA: Diagnosis not present

## 2018-07-15 DIAGNOSIS — M109 Gout, unspecified: Secondary | ICD-10-CM | POA: Diagnosis not present

## 2018-07-18 DIAGNOSIS — M2550 Pain in unspecified joint: Secondary | ICD-10-CM | POA: Diagnosis not present

## 2018-07-18 DIAGNOSIS — G629 Polyneuropathy, unspecified: Secondary | ICD-10-CM | POA: Diagnosis not present

## 2018-07-18 DIAGNOSIS — M1A09X0 Idiopathic chronic gout, multiple sites, without tophus (tophi): Secondary | ICD-10-CM | POA: Diagnosis not present

## 2018-08-29 DIAGNOSIS — J029 Acute pharyngitis, unspecified: Secondary | ICD-10-CM | POA: Diagnosis not present

## 2018-08-29 DIAGNOSIS — J069 Acute upper respiratory infection, unspecified: Secondary | ICD-10-CM | POA: Diagnosis not present

## 2018-08-29 DIAGNOSIS — R509 Fever, unspecified: Secondary | ICD-10-CM | POA: Diagnosis not present

## 2018-11-02 DIAGNOSIS — M25532 Pain in left wrist: Secondary | ICD-10-CM | POA: Diagnosis not present

## 2018-11-02 DIAGNOSIS — M2550 Pain in unspecified joint: Secondary | ICD-10-CM | POA: Diagnosis not present

## 2018-11-02 DIAGNOSIS — M1A09X0 Idiopathic chronic gout, multiple sites, without tophus (tophi): Secondary | ICD-10-CM | POA: Diagnosis not present

## 2018-11-02 DIAGNOSIS — G629 Polyneuropathy, unspecified: Secondary | ICD-10-CM | POA: Diagnosis not present

## 2018-11-12 DIAGNOSIS — E669 Obesity, unspecified: Secondary | ICD-10-CM | POA: Diagnosis not present

## 2018-11-12 DIAGNOSIS — E039 Hypothyroidism, unspecified: Secondary | ICD-10-CM | POA: Diagnosis not present

## 2018-11-12 DIAGNOSIS — E782 Mixed hyperlipidemia: Secondary | ICD-10-CM | POA: Diagnosis not present

## 2018-11-12 DIAGNOSIS — G4700 Insomnia, unspecified: Secondary | ICD-10-CM | POA: Diagnosis not present

## 2018-11-12 DIAGNOSIS — R51 Headache: Secondary | ICD-10-CM | POA: Diagnosis not present

## 2018-11-15 DIAGNOSIS — G629 Polyneuropathy, unspecified: Secondary | ICD-10-CM | POA: Diagnosis not present

## 2018-11-15 DIAGNOSIS — M25532 Pain in left wrist: Secondary | ICD-10-CM | POA: Diagnosis not present

## 2018-11-15 DIAGNOSIS — M1A09X0 Idiopathic chronic gout, multiple sites, without tophus (tophi): Secondary | ICD-10-CM | POA: Diagnosis not present

## 2018-11-15 DIAGNOSIS — M2550 Pain in unspecified joint: Secondary | ICD-10-CM | POA: Diagnosis not present

## 2018-11-20 DIAGNOSIS — M722 Plantar fascial fibromatosis: Secondary | ICD-10-CM | POA: Diagnosis not present

## 2018-11-20 DIAGNOSIS — B079 Viral wart, unspecified: Secondary | ICD-10-CM | POA: Diagnosis not present

## 2018-12-03 DIAGNOSIS — M25532 Pain in left wrist: Secondary | ICD-10-CM | POA: Diagnosis not present

## 2018-12-17 DIAGNOSIS — M25532 Pain in left wrist: Secondary | ICD-10-CM | POA: Diagnosis not present

## 2018-12-26 DIAGNOSIS — M25532 Pain in left wrist: Secondary | ICD-10-CM | POA: Diagnosis not present

## 2018-12-28 DIAGNOSIS — S63592A Other specified sprain of left wrist, initial encounter: Secondary | ICD-10-CM | POA: Diagnosis not present

## 2019-01-10 DIAGNOSIS — M24132 Other articular cartilage disorders, left wrist: Secondary | ICD-10-CM | POA: Diagnosis not present

## 2019-02-09 DIAGNOSIS — M109 Gout, unspecified: Secondary | ICD-10-CM | POA: Diagnosis not present

## 2019-03-08 DIAGNOSIS — M79632 Pain in left forearm: Secondary | ICD-10-CM | POA: Diagnosis not present

## 2019-03-08 DIAGNOSIS — S63592A Other specified sprain of left wrist, initial encounter: Secondary | ICD-10-CM | POA: Diagnosis not present

## 2019-03-08 DIAGNOSIS — S63512A Sprain of carpal joint of left wrist, initial encounter: Secondary | ICD-10-CM | POA: Diagnosis not present

## 2019-03-12 DIAGNOSIS — M24132 Other articular cartilage disorders, left wrist: Secondary | ICD-10-CM | POA: Insufficient documentation

## 2019-03-20 ENCOUNTER — Ambulatory Visit (INDEPENDENT_AMBULATORY_CARE_PROVIDER_SITE_OTHER): Payer: 59 | Admitting: Podiatry

## 2019-03-20 ENCOUNTER — Other Ambulatory Visit: Payer: Self-pay

## 2019-03-20 ENCOUNTER — Ambulatory Visit: Payer: 59 | Admitting: Podiatry

## 2019-03-20 ENCOUNTER — Encounter: Payer: Self-pay | Admitting: Podiatry

## 2019-03-20 VITALS — BP 180/106 | HR 94 | Temp 98.1°F | Resp 16

## 2019-03-20 DIAGNOSIS — B070 Plantar wart: Secondary | ICD-10-CM | POA: Diagnosis not present

## 2019-03-20 NOTE — Progress Notes (Signed)
   Subjective:    Patient ID: Dustin Sampson, male    DOB: 28-Jan-1966, 53 y.o.   MRN: [id number]  HPI    Review of Systems  All other systems reviewed and are negative.      Objective:   Physical Exam        Assessment & Plan:

## 2019-03-20 NOTE — Patient Instructions (Signed)

## 2019-03-20 NOTE — Progress Notes (Signed)
Subjective:   Patient ID: Dustin Sampson, male   DOB: 53 y.o.   MRN: [id number]   HPI Patient presents with chronic lesion bottom of the left heel that has been very tender for at least 6 months and has had it worked on by 2 other doctors and not gotten better.  Patient states that it is been very sore and it makes it hard for him to walk and at times it is very intense and he does have elevated blood pressure and we did find to be elevated today and I would like for him to see his family physician for this.  Patient does smoke half pack per day and would like to be more active   Review of Systems  All other systems reviewed and are negative.       Objective:  Physical Exam Vitals signs and nursing note reviewed.  Constitutional:      Appearance: He is well-developed.  Pulmonary:     Effort: Pulmonary effort is normal.  Musculoskeletal: Normal range of motion.  Skin:    General: Skin is warm.  Neurological:     Mental Status: He is alert.     Neurovascular status found to be intact muscle strength is adequate range of motion within normal limits with patient found to have large mass of 2.1 cm x 1.5 cm plantar aspect right that does have pinpoint bleeding upon debridement and is painful to lateral pressure.  Patient was noted to have good digital perfusion well oriented x3     Assessment:  Large verruca plantaris plantar aspect left with pain with possible other unknown lesion due to 54-month duration     Plan:  H&P condition reviewed and recommended removal of the mass.  I explained procedure risk and the fact this will take a number of weeks to recover and will be sore and he wants surgery.  Today I infiltrated with 60 mg Xylocaine with epinephrine and I then went ahead and did a posterior tibial block.  Sterile application applied to the area and using sterile instrumentation I excised the mass entirely and I went ahead and I applied phenol to the base and placed the mass in formalin  for pathological evaluation.  I encouraged him to utilize elevation compression and that this will will be soaked on a daily basis.  Encouraged him to call us with any concerns that may occur

## 2019-03-20 NOTE — Addendum Note (Signed)
Addended by: Dorris Singh on: 03/20/2019 02:16 PM   Modules accepted: Orders

## 2019-04-15 ENCOUNTER — Ambulatory Visit (INDEPENDENT_AMBULATORY_CARE_PROVIDER_SITE_OTHER): Payer: 59 | Admitting: Podiatry

## 2019-04-15 ENCOUNTER — Encounter: Payer: Self-pay | Admitting: Podiatry

## 2019-04-15 ENCOUNTER — Other Ambulatory Visit: Payer: Self-pay

## 2019-04-15 VITALS — Temp 98.2°F

## 2019-04-15 DIAGNOSIS — B070 Plantar wart: Secondary | ICD-10-CM | POA: Diagnosis not present

## 2019-04-16 NOTE — Progress Notes (Signed)
Subjective:   Patient ID: Dustin Sampson, male   DOB: 53 y.o.   MRN: [id number]   HPI The area where the wart was taken out seems to be doing fine but he feels like there is some small areas around it that are bothering him   ROS      Objective:  Physical Exam  Neurovascular status intact with patient found to have keratotic lesion plantar aspect left heel with good healing from previous excision and small area what appears to be a verruca plantaris     Assessment:  Small new area of verruca plantaris left     Plan:  Debridement accomplished and I flushed the area and then applied a small amount of Cantha cure with instructions for what to do if any blistering or other immune response were to occur.  Sterile dressing applied

## 2019-05-16 ENCOUNTER — Other Ambulatory Visit: Payer: Self-pay | Admitting: Family Medicine

## 2019-05-16 DIAGNOSIS — R221 Localized swelling, mass and lump, neck: Secondary | ICD-10-CM

## 2019-05-27 ENCOUNTER — Other Ambulatory Visit: Payer: Self-pay

## 2019-05-27 ENCOUNTER — Encounter: Payer: Self-pay | Admitting: Podiatry

## 2019-05-27 ENCOUNTER — Ambulatory Visit (INDEPENDENT_AMBULATORY_CARE_PROVIDER_SITE_OTHER): Payer: 59 | Admitting: Podiatry

## 2019-05-27 VITALS — Temp 97.3°F

## 2019-05-27 DIAGNOSIS — B070 Plantar wart: Secondary | ICD-10-CM | POA: Diagnosis not present

## 2019-05-27 MED ORDER — FLUOROURACIL 5 % EX CREA: TOPICAL_CREAM | Freq: Two times a day (BID) | CUTANEOUS | 1 refills | Status: DC

## 2019-05-27 NOTE — Patient Instructions (Signed)

## 2019-05-29 NOTE — Progress Notes (Signed)
Subjective:   Patient ID: Dustin Sampson, male   DOB: 53 y.o.   MRN: [id number]   HPI Patient presents stating that his left heel he feels like this lesion is back and it is been very tender and he needs it cut out neuro   ROS      Objective:  Physical Exam  Vascular status intact with patient's left plantar lesion appearing to be in a new spot and upon debridement pinpoint bleeding is noted with it measuring about 1.5 cm x 1.5 cm with lateral pressure pain     Assessment:  A new verruca plantaris which is occurred plantar left next to the area that has been worked on previously     Plan:  H&P education rendered and at this point I do think it needs to be excised and I went ahead and I injected with 120 mg Xylocaine with epinephrine and Marcaine proximal to the area.  I then did sterile prep of the area and using sharp dissection I excised the mass and I did have all the classic look of the wart as he had had previously.  At this point we are not going to send to pathology as we do have positive that it appears to be a benign process and he refuses this to go to pathology currently I then applied medication to the area sterile dressing and padding and advised on Efudex usage starting in several weeks to try to prevent reoccurrence in the future.  Patient Will be seen back as needed

## 2019-05-30 ENCOUNTER — Other Ambulatory Visit: Payer: Self-pay

## 2019-05-30 ENCOUNTER — Ambulatory Visit
Admission: RE | Admit: 2019-05-30 | Discharge: 2019-05-30 | Disposition: A | Discharge status: Home / Self Care | Payer: 59 | Source: Ambulatory Visit | Attending: Family Medicine | Admitting: Family Medicine

## 2019-05-30 DIAGNOSIS — R221 Localized swelling, mass and lump, neck: Secondary | ICD-10-CM

## 2019-05-31 ENCOUNTER — Other Ambulatory Visit: Payer: Self-pay | Admitting: Family Medicine

## 2019-05-31 DIAGNOSIS — R221 Localized swelling, mass and lump, neck: Secondary | ICD-10-CM

## 2019-06-05 ENCOUNTER — Other Ambulatory Visit: Payer: Self-pay

## 2019-06-05 ENCOUNTER — Ambulatory Visit
Admission: RE | Admit: 2019-06-05 | Discharge: 2019-06-05 | Disposition: A | Discharge status: Home / Self Care | Payer: 59 | Source: Ambulatory Visit | Attending: Family Medicine | Admitting: Family Medicine

## 2019-06-05 DIAGNOSIS — R221 Localized swelling, mass and lump, neck: Secondary | ICD-10-CM

## 2019-06-05 MED ORDER — IOPAMIDOL (ISOVUE-300) INJECTION 61%
75.0000 mL | Freq: Once | INTRAVENOUS | Status: AC | PRN
  Administered 2019-06-05: 75 mL via INTRAVENOUS

## 2019-06-10 ENCOUNTER — Other Ambulatory Visit: Payer: Self-pay | Admitting: Family Medicine

## 2019-06-10 DIAGNOSIS — IMO0001 Reserved for inherently not codable concepts without codable children: Secondary | ICD-10-CM

## 2019-06-10 DIAGNOSIS — R911 Solitary pulmonary nodule: Secondary | ICD-10-CM

## 2019-06-12 ENCOUNTER — Other Ambulatory Visit (HOSPITAL_COMMUNITY): Payer: Self-pay | Admitting: Otolaryngology

## 2019-06-12 DIAGNOSIS — K118 Other diseases of salivary glands: Secondary | ICD-10-CM

## 2019-06-17 ENCOUNTER — Other Ambulatory Visit: Payer: Self-pay

## 2019-06-17 ENCOUNTER — Ambulatory Visit
Admission: RE | Admit: 2019-06-17 | Discharge: 2019-06-17 | Disposition: A | Discharge status: Home / Self Care | Payer: 59 | Source: Ambulatory Visit | Attending: Family Medicine | Admitting: Family Medicine

## 2019-06-17 DIAGNOSIS — IMO0001 Reserved for inherently not codable concepts without codable children: Secondary | ICD-10-CM

## 2019-06-17 DIAGNOSIS — R911 Solitary pulmonary nodule: Secondary | ICD-10-CM

## 2019-06-19 ENCOUNTER — Other Ambulatory Visit: Payer: Self-pay | Admitting: Student

## 2019-06-20 ENCOUNTER — Ambulatory Visit (HOSPITAL_COMMUNITY)
Admission: RE | Admit: 2019-06-20 | Discharge: 2019-06-20 | Disposition: A | Discharge status: Home / Self Care | Payer: 59 | Source: Ambulatory Visit | Attending: Otolaryngology | Admitting: Otolaryngology

## 2019-06-20 ENCOUNTER — Encounter (HOSPITAL_COMMUNITY): Payer: Self-pay

## 2019-06-20 ENCOUNTER — Other Ambulatory Visit: Payer: Self-pay

## 2019-06-20 ENCOUNTER — Other Ambulatory Visit (HOSPITAL_COMMUNITY): Payer: Self-pay | Admitting: Otolaryngology

## 2019-06-20 DIAGNOSIS — E079 Disorder of thyroid, unspecified: Secondary | ICD-10-CM | POA: Diagnosis not present

## 2019-06-20 DIAGNOSIS — J439 Emphysema, unspecified: Secondary | ICD-10-CM | POA: Insufficient documentation

## 2019-06-20 DIAGNOSIS — F17210 Nicotine dependence, cigarettes, uncomplicated: Secondary | ICD-10-CM | POA: Insufficient documentation

## 2019-06-20 DIAGNOSIS — Z880 Allergy status to penicillin: Secondary | ICD-10-CM | POA: Insufficient documentation

## 2019-06-20 DIAGNOSIS — Z886 Allergy status to analgesic agent status: Secondary | ICD-10-CM | POA: Insufficient documentation

## 2019-06-20 DIAGNOSIS — E785 Hyperlipidemia, unspecified: Secondary | ICD-10-CM | POA: Insufficient documentation

## 2019-06-20 DIAGNOSIS — Z79890 Hormone replacement therapy (postmenopausal): Secondary | ICD-10-CM | POA: Insufficient documentation

## 2019-06-20 DIAGNOSIS — I10 Essential (primary) hypertension: Secondary | ICD-10-CM | POA: Insufficient documentation

## 2019-06-20 DIAGNOSIS — Z79899 Other long term (current) drug therapy: Secondary | ICD-10-CM | POA: Diagnosis not present

## 2019-06-20 DIAGNOSIS — Z800 Family history of malignant neoplasm of digestive organs: Secondary | ICD-10-CM | POA: Diagnosis not present

## 2019-06-20 DIAGNOSIS — K118 Other diseases of salivary glands: Secondary | ICD-10-CM

## 2019-06-20 DIAGNOSIS — E119 Type 2 diabetes mellitus without complications: Secondary | ICD-10-CM | POA: Diagnosis not present

## 2019-06-20 HISTORY — DX: Type 2 diabetes mellitus without complications: E11.9

## 2019-06-20 LAB — PROTIME-INR
INR: 0.9 (ref 0.8–1.2)
Prothrombin Time: 12.3 seconds (ref 11.4–15.2)

## 2019-06-20 LAB — CBC
HCT: 40.4 % (ref 39.0–52.0)
Hemoglobin: 13.4 g/dL (ref 13.0–17.0)
MCH: 28.6 pg (ref 26.0–34.0)
MCHC: 33.2 g/dL (ref 30.0–36.0)
MCV: 86.3 fL (ref 80.0–100.0)
Platelets: 255 10*3/uL (ref 150–400)
RBC: 4.68 MIL/uL (ref 4.22–5.81)
RDW: 13.1 % (ref 11.5–15.5)
WBC: 10.5 10*3/uL (ref 4.0–10.5)
nRBC: 0 % (ref 0.0–0.2)

## 2019-06-20 LAB — GLUCOSE, CAPILLARY: Glucose-Capillary: 143 mg/dL — ABNORMAL HIGH (ref 70–99)

## 2019-06-20 MED ORDER — MIDAZOLAM HCL 2 MG/2ML IJ SOLN
INTRAMUSCULAR | Status: AC | PRN
  Administered 2019-06-20 (×3): 1 mg via INTRAVENOUS

## 2019-06-20 MED ORDER — LIDOCAINE HCL (PF) 1 % IJ SOLN
INTRAMUSCULAR | Status: AC | PRN
  Administered 2019-06-20: 10 mL

## 2019-06-20 MED ORDER — LIDOCAINE HCL 1 % IJ SOLN
INTRAMUSCULAR | Status: AC
  Filled 2019-06-20: qty 20

## 2019-06-20 MED ORDER — MIDAZOLAM HCL 2 MG/2ML IJ SOLN
INTRAMUSCULAR | Status: AC
  Filled 2019-06-20: qty 2

## 2019-06-20 MED ORDER — FENTANYL CITRATE (PF) 100 MCG/2ML IJ SOLN
INTRAMUSCULAR | Status: AC
  Filled 2019-06-20: qty 2

## 2019-06-20 MED ORDER — SODIUM CHLORIDE 0.9 % IV SOLN
INTRAVENOUS | Status: DC
  Administered 2019-06-20: 12:00:00 via INTRAVENOUS

## 2019-06-20 MED ORDER — FENTANYL CITRATE (PF) 100 MCG/2ML IJ SOLN
INTRAMUSCULAR | Status: AC | PRN
  Administered 2019-06-20 (×2): 50 ug via INTRAVENOUS

## 2019-06-20 NOTE — Progress Notes (Signed)
Post IR orders requested patient to remain in recovery for 1 hr (1530).  Pt. Requested to leave at 1515 due to another appointment.  Contacted Dr. Kathlene Cote- MD agreed and stated pt. Could leave early at 1515. Pt. Discharged at 1515 no issues/ VS stable and no pain voiced.

## 2019-06-20 NOTE — Procedures (Signed)
Interventional Radiology Procedure Note  Procedure: US Guided Biopsy of Left Parotid Mass  Complications: None  Estimated Blood Loss: < 10 mL  Findings: 6 G core biopsy of left parotid mass performed under US guidance.  Four core samples obtained and sent to Pathology.  Venetia Night. Kathlene Cote, M.D Pager:  [phone]

## 2019-06-20 NOTE — H&P (Signed)
Chief Complaint: Patient was seen in consultation today for left parotid mass/biopsy.  Referring Physician(s): Teoh,Su  Supervising Physician: Aletta Edouard  Patient Status: [facility] - Out-pt  History of Present Illness: Annette Liotta is a 53 y.o. male with a past medical history of hypertension, hyperlipidemia, thyroid disease, diabetes mellitus, and insomnia. He has had neck swelling and went to his PCP, [name], PA-C, for further evaluation. She ordered imaging scans which revealed a left lateral neck mass located in the left parotid gland. He was referred to otolaryngology and met with Dr. Benjamine Mola for further management.  US soft tissues head/neck 05/30/2019: 1. Heterogeneous 2.4 cm mass in the lateral left neck, possible metastatic disease. Recommend neck CT with contrast.  CT soft tissue neck 06/05/2019: 1. Three distinct round soft tissue masses in the superficial lobe of the lower left parotid gland. The largest measuring up to 27 mm diameter likely corresponds to that on the recent ultrasound. 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 But recommend ENT follow-up as malignancy is still possible. No associated cervical lymphadenopathy or other acute findings in the neck. 2. Evidence of Emphysema ([id number]) with several small upper lobe lung nodules. Recommend follow-up Chest CT (noncontrast should suffice).  IR consulted by Dr. Benjamine Mola for possible image-guided left parotid mass biopsy. Patient awake and alert sitting in bed. Complains of left foot pain, states that he had a wart removed a little while ago. States that he will see his foot and ankle physician for this. Denies fever, chills, chest pain, dyspnea, abdominal pain, or headache.   Past Medical History:  Diagnosis Date   Diabetes mellitus without complication ([place])    Hyperlipemia    Hypertension    Insomnia    Thyroid disease     Past Surgical History:    Procedure Laterality Date   COLONOSCOPY     x2   NASAL SINUS SURGERY     WISDOM TOOTH EXTRACTION     WRIST SURGERY     bilat    Allergies: Aspirin, Avelox [moxifloxacin hcl in nacl], and Penicillins  Medications: Prior to Admission medications   Medication Sig Start Date End Date Taking? Authorizing Provider  atorvastatin (LIPITOR) 10 MG tablet Take 10 mg by mouth daily.   Yes [provider]  fluorouracil (EFUDEX) 5 % cream Apply topically 2 (two) times daily. 05/27/19  Yes Regal, Tamala Fothergill, DPM  gabapentin (NEURONTIN) 300 MG capsule TAKE 1 CAPSULE BY MOUTH 3 TIMES A DAY AS NEEDED FOR NEUROPATHY 02/26/19  Yes [provider]  levothyroxine (SYNTHROID, LEVOTHROID) 200 MCG tablet Take 200 mcg by mouth daily before breakfast.   Yes [provider]  losartan (COZAAR) 50 MG tablet Take 50 mg by mouth daily.   Yes [provider]  temazepam (RESTORIL) 30 MG capsule Take 30 mg by mouth at bedtime as needed for sleep.   Yes [provider]     Family History  Problem Relation Age of Onset   Colon cancer Father     Social History   Socioeconomic History   Marital status: Married    Spouse name: Not on file   Number of children: 2   Years of education: BS   Highest education level: Not on file  Occupational History   Occupation: Arts development officer  Social Needs   Financial resource strain: Not on file   Food insecurity    Worry: Not on file    Inability: Not on file  Transportation needs    Medical: Not on file    Non-medical: Not on file  Tobacco Use   Smoking status: Current Every Day Smoker    Packs/day: 0.50    Types: Cigarettes   Smokeless tobacco: Former Systems developer  Substance and Sexual Activity   Alcohol use: No   Drug use: No   Sexual activity: Not on file  Lifestyle   Physical activity    Days per week: Not on file    Minutes per session: Not on file   Stress: Not on file  Relationships   Social  connections    Talks on phone: Not on file    Gets together: Not on file    Attends religious service: Not on file    Active member of club or organization: Not on file    Attends meetings of clubs or organizations: Not on file    Relationship status: Not on file  Other Topics Concern   Not on file  Social History Narrative   Married, lives with wife and two teenage children. He and his sister alternate with their parents living with them every other month. His parents are in their upper 70's. He drinks 4-5 cups of coffee a day and 2-3 sodas or glasses of tea. He does not get any regular exercise.     Review of Systems: A 12 point ROS discussed and pertinent positives are indicated in the HPI above.  All other systems are negative.  Review of Systems  Constitutional: Negative for chills and fever.  Respiratory: Negative for shortness of breath and wheezing.   Cardiovascular: Negative for chest pain and palpitations.  Gastrointestinal: Negative for abdominal pain.  Musculoskeletal:       Positive for left foot pain.  Psychiatric/Behavioral: Negative for behavioral problems and confusion.    Vital Signs: BP (!) 172/96 (BP Location: Right Arm)    Pulse (!) 103    Temp 97.8 F (36.6 C) (Oral)    Resp 18    SpO2 98%   Physical Exam Vitals signs and nursing note reviewed.  Constitutional:      General: He is not in acute distress.    Appearance: Normal appearance.  Cardiovascular:     Rate and Rhythm: Normal rate and regular rhythm.     Heart sounds: Normal heart sounds. No murmur.  Pulmonary:     Effort: Pulmonary effort is normal. No respiratory distress.     Breath sounds: Normal breath sounds. No wheezing.  Skin:    General: Skin is warm and dry.  Neurological:     Mental Status: He is alert and oriented to person, place, and time.  Psychiatric:        Mood and Affect: Mood normal.        Behavior: Behavior normal.        Thought Content: Thought content normal.         Judgment: Judgment normal.      MD Evaluation Airway: WNL Heart: WNL Abdomen: WNL Chest/ Lungs: WNL ASA  Classification: 2 Mallampati/Airway Score: One   Imaging: Ct Soft Tissue Neck W Contrast  Result Date: 06/06/2019 CLINICAL DATA:  53 year old male with left side neck mass x3 months. 2.4 centimeter left neck mass on recent neck ultrasound. Creatinine was obtained on site at [place] at [address] Results: Creatinine 0.8 mg/dL. EXAM: CT NECK WITH CONTRAST TECHNIQUE: Multidetector CT imaging of the neck was performed using the standard protocol following the bolus administration of intravenous  contrast. CONTRAST:  20m ISOVUE-300 IOPAMIDOL (ISOVUE-300) INJECTION 61% COMPARISON:  Neck ultrasound 05/30/2019. Head CT 07/06/2011. FINDINGS: Pharynx and larynx: Laryngeal and pharyngeal soft tissue contours are within normal limits. Negative parapharyngeal spaces and retropharyngeal space. Salivary glands: Negative sublingual space. Submandibular glands appear symmetric and within normal limits. The right parotid gland appears normal. There are 3 distinct rounded soft tissue masses in the superficial lobe of the inferior left parotid gland. See sagittal images 97 through 100. The largest measures up to 27 millimeters diameter (series 3, image 34) and is the most inferior. There is an adjacent 16 millimeter nodule, and slightly more superiorly a 9 millimeter nodule. All have similar density which is above that of background left parotid gland. No regional inflammation. Normal CT appearance of the left stylomastoid foramen. Thyroid: Negative. Lymph nodes: Negative. No cervical lymphadenopathy. Vascular: Suboptimal intravascular contrast bolus but the major vascular structures in the neck and at the skull base are patent. The left vertebral artery is dominant. Limited intracranial: Small chronic appearing right cerebellar infarct on series 3, image 8, stable since 2012. Visualized orbits:  Not included. Mastoids and visualized paranasal sinuses: Clear. Skeleton: Benign-appearing smooth enlargement of the maxillary incisive foramen (series 5, image 8). This appearance is commonly associated with benign naso palatine cysts. No acute or suspicious osseous lesion. Upper chest: Mild centrilobular and paraseptal emphysema. There are several small 3-5 millimeter upper lobe lung nodules, including that in the right upper lobe on series 5, image 129. Simple fluid density in the visible right superior mediastinum is probably fluid in a superior pericardial recess (normal variant). No superior mediastinal lymphadenopathy. IMPRESSION: 1. Three distinct round soft tissue masses in the superficial lobe of the lower left parotid gland. The largest measuring up to 27 mm diameter likely corresponds to that on the recent ultrasound. 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 But recommend ENT follow-up as malignancy is still possible. No associated cervical lymphadenopathy or other acute findings in the neck. 2. Evidence of Emphysema (([id number]) with several small upper lobe lung nodules. Recommend follow-up Chest CT (noncontrast should suffice). Electronically Signed   By: HGenevie AnnM.D.   On: 06/06/2019 00:31   Ct Chest Wo Contrast  Result Date: 06/17/2019 CLINICAL DATA:  Follow-up lung nodules EXAM: CT CHEST WITHOUT CONTRAST TECHNIQUE: Multidetector CT imaging of the chest was performed following the standard protocol without IV contrast. COMPARISON:  CT neck, 06/05/2019 FINDINGS: Cardiovascular: No significant vascular findings. Normal heart size. No pericardial effusion. Mediastinum/Nodes: No enlarged mediastinal, hilar, or axillary lymph nodes. Thyroid gland, trachea, and esophagus demonstrate no significant findings. Lungs/Pleura: Mild paraseptal emphysema. There are multiple small bilateral pulmonary nodules in the lung apices measuring up to 5 mm (series  3, image 41) with a background of innumerable tiny centrilobular pulmonary nodules. No pleural effusion or pneumothorax. Upper Abdomen: No acute abnormality. Musculoskeletal: No chest wall mass or suspicious bone lesions identified. IMPRESSION: 1. There are multiple small bilateral pulmonary nodules in the lung apices measuring up to 5 mm (series 3, image 41) with a background of innumerable tiny centrilobular pulmonary nodules. Findings are nonspecific and likely infectious or inflammatory, tiny centrilobular pulmonary nodules likely reflecting smoking-related respiratory bronchiolitis. CT follow-up for larger discrete nodules is optional at 12 months. 2.  Mild paraseptal emphysema. Electronically Signed   By: AEddie CandleM.D.   On: 06/17/2019 14:50   UKoreaSoft Tissue Head & Neck (non-thyroid)  Result Date: 05/31/2019 CLINICAL DATA:  Left lateral  neck swelling EXAM: ULTRASOUND OF HEAD/NECK SOFT TISSUES TECHNIQUE: Ultrasound examination of the head and neck soft tissues was performed in the area of clinical concern. COMPARISON:  None. FINDINGS: Neck swelling correlates with a heterogeneous lobulated hypoechoic mass with irregular internal architecture. The mass measures up to 2.4 cm in span. The mass is not clearly within a salivary gland IMPRESSION: Heterogeneous 2.4 cm mass in the lateral left neck, possible metastatic disease. Recommend neck CT with contrast. Electronically Signed   By: Monte Fantasia M.D.   On: 05/31/2019 08:18    Labs:  CBC: Recent Labs    06/20/19 1108  WBC 10.5  HGB 13.4  HCT 40.4  PLT 255    COAGS: Recent Labs    06/20/19 1108  INR 0.9     Assessment and Plan:  Left parotid mass. Plan for image-guided left parotid mass biopsy today with Dr. Kathlene Cote. Patient is NPO. Afebrile and WBCs WNL. He does not take blood thinners. INR 0.9 today.  Risks and benefits discussed with the patient including, but not limited to bleeding, infection, damage to adjacent  structures or low yield requiring additional tests. All of the patient's questions were answered, patient is agreeable to proceed. Consent signed and in chart.   Thank you for this interesting consult.  I greatly enjoyed meeting Sevag Shearn and look forward to participating in their care.  A copy of this report was sent to the requesting provider on this date.  Electronically Signed: Earley Abide, PA-C 06/20/2019, 12:29 PM   I spent a total of 40 Minutes in face to face in clinical consultation, greater than 50% of which was counseling/coordinating care for left parotid mass/biopsy.

## 2019-06-20 NOTE — Discharge Instructions (Addendum)
Biopsy, Care After This sheet gives you information about how to care for yourself after your procedure. Your health care provider may also give you more specific instructions. If you have problems or questions, contact your health care provider. What can I expect after the procedure? After the procedure, it is common to have:  Bruising.  Soreness.  Mild swelling. Follow these instructions at home: Medicines  Take over-the-counter and prescription medicines only as told by your health care provider.  If you were prescribed an antibiotic medicine, take it as told by your health care provider. Do not stop taking the antibiotic even if you start to feel better. Incision care   Follow instructions from your health care provider about how to take care of your incision. Make sure you: ? Wash your hands with soap and water before and after you change your bandage (dressing). If soap and water are not available, use hand sanitizer. ? Change your dressing as told by your health care provider. ? Leave stitches (sutures), skin glue, or adhesive strips in place. These skin closures may need to stay in place for 2 weeks or longer. If adhesive strip edges start to loosen and curl up, you may trim the loose edges. Do not remove adhesive strips completely unless your health care provider tells you to do that.  Check your incision area every day for signs of infection. Check for: ? More redness, swelling, or pain. ? Fluid or blood. ? Warmth. ? Pus or a bad smell. Driving  Do not drive for 24 hours if you were given a sedative during your procedure.  Do not drive or use heavy machinery while taking prescription pain medicine. General instructions  Return to your normal activities as told by your health care provider. Ask your health care provider what activities are safe for you.  Do not take baths, swim, or use a hot tub until your health care provider approves. Ask your health care provider if  you may take showers. You may only be allowed to take sponge baths.  Keep all follow-up visits as told by your health care provider. This is important. Contact a health care provider if:  You have more redness, swelling, or pain around your incision.  You have fluid or blood coming from your incision.  Your incision feels warm to the touch.  You have pus or a bad smell coming from your incision.  You have a fever.  You have pain or numbness that gets worse or lasts longer than a few days. Summary  After a lymph node biopsy, it is common to have bruising, soreness, and mild swelling.  Follow your health care provider's instructions about taking care of yourself at home. You will be told how to take medicines, take care of your incision, and check for infection.  Return to your normal activities as told by your health care provider. Ask your health care provider what activities are safe for you.  Contact a health care provider if you have more redness, swelling, or pain around your incision, you have a fever, or you have worsening pain or numbness. This information is not intended to replace advice given to you by your health care provider. Make sure you discuss any questions you have with your health care provider. Document Released: 11/06/2015 Document Revised: 05/17/2018 Document Reviewed: 05/17/2018 Elsevier Patient Education  [place].    Moderate Conscious Sedation, Adult, Care After These instructions provide you with information about caring for yourself after your  procedure. Your health care provider may also give you more specific instructions. Your treatment has been planned according to current medical practices, but problems sometimes occur. Call your health care provider if you have any problems or questions after your procedure. What can I expect after the procedure? After your procedure, it is common:  To feel sleepy for several hours.  To feel clumsy and  have poor balance for several hours.  To have poor judgment for several hours.  To vomit if you eat too soon. Follow these instructions at home: For at least 24 hours after the procedure:   Do not: ? Participate in activities where you could fall or become injured. ? Drive. ? Use heavy machinery. ? Drink alcohol. ? Take sleeping pills or medicines that cause drowsiness. ? Make important decisions or sign legal documents. ? Take care of children on your own.  Rest. Eating and drinking  Follow the diet recommended by your health care provider.  If you vomit: ? Drink water, juice, or soup when you can drink without vomiting. ? Make sure you have little or no nausea before eating solid foods. General instructions  Have a responsible adult stay with you until you are awake and alert.  Take over-the-counter and prescription medicines only as told by your health care provider.  If you smoke, do not smoke without supervision.  Keep all follow-up visits as told by your health care provider. This is important. Contact a health care provider if:  You keep feeling nauseous or you keep vomiting.  You feel light-headed.  You develop a rash.  You have a fever. Get help right away if:  You have trouble breathing. This information is not intended to replace advice given to you by your health care provider. Make sure you discuss any questions you have with your health care provider. Document Released: 07/31/2013 Document Revised: 09/22/2017 Document Reviewed: 01/30/2016 Elsevier Patient Education  2020 [employer].

## 2019-06-21 ENCOUNTER — Encounter: Payer: Self-pay | Admitting: Podiatry

## 2019-06-21 ENCOUNTER — Other Ambulatory Visit: Payer: Self-pay | Admitting: Podiatry

## 2019-06-21 ENCOUNTER — Ambulatory Visit (INDEPENDENT_AMBULATORY_CARE_PROVIDER_SITE_OTHER): Payer: 59 | Admitting: Podiatry

## 2019-06-21 ENCOUNTER — Other Ambulatory Visit: Payer: Self-pay

## 2019-06-21 ENCOUNTER — Ambulatory Visit (INDEPENDENT_AMBULATORY_CARE_PROVIDER_SITE_OTHER): Payer: 59

## 2019-06-21 DIAGNOSIS — M722 Plantar fascial fibromatosis: Secondary | ICD-10-CM

## 2019-06-21 DIAGNOSIS — M79671 Pain in right foot: Secondary | ICD-10-CM

## 2019-06-21 DIAGNOSIS — M779 Enthesopathy, unspecified: Secondary | ICD-10-CM

## 2019-06-21 DIAGNOSIS — M79672 Pain in left foot: Secondary | ICD-10-CM

## 2019-06-21 MED ORDER — METHYLPREDNISOLONE 4 MG PO TBPK: ORAL_TABLET | ORAL | 0 refills | Status: DC

## 2019-06-24 NOTE — Progress Notes (Signed)
Subjective:   Patient ID: Dustin Sampson, male   DOB: 53 y.o.   MRN: [id number]   HPI Patient states that heel seems to be somewhat improved but he is getting pain in his ankle left over right and states that it is been going on now for a while.  Also feels like he still may have a lesion bottom left   ROS      Objective:  Physical Exam  Vascular status intact with patient's lesion healing well with moderate discomfort in the plantar heel but exquisite discomfort in the sinus tarsi left     Assessment:  2 different problems 1 being moderate fasciitis left with the possibility for compensatory sinus tarsitis capsulitis left     Plan:  H&P both conditions discussed the today I did do a sinus tarsi injection left 3 mg Kenalog 5 g Xylocaine discussed plantar fasciitis and recommended shoe gear support and utilization of padding.  Reappoint as symptoms indicate and 2 weeks  X-rays indicate spur but did not indicate pathology in the ankle currently

## 2019-06-28 ENCOUNTER — Ambulatory Visit (INDEPENDENT_AMBULATORY_CARE_PROVIDER_SITE_OTHER): Payer: 59 | Admitting: Podiatry

## 2019-06-28 ENCOUNTER — Other Ambulatory Visit: Payer: Self-pay

## 2019-06-28 ENCOUNTER — Encounter: Payer: Self-pay | Admitting: Podiatry

## 2019-06-28 DIAGNOSIS — M722 Plantar fascial fibromatosis: Secondary | ICD-10-CM | POA: Diagnosis not present

## 2019-06-28 DIAGNOSIS — B070 Plantar wart: Secondary | ICD-10-CM

## 2019-07-04 ENCOUNTER — Ambulatory Visit: Payer: 59 | Admitting: Podiatry

## 2019-07-04 NOTE — Progress Notes (Signed)
Subjective:   Patient ID: Dustin Sampson, male   DOB: 53 y.o.   MRN: [id number]   HPI Patient states his left heel has started to hurt him quite a bit again and it is difficult for him to walk on.  He also walks on cement floors at work and is looking for new orthotics to try to take some of the pressure off his feet nerve   ROS      Objective:  Physical Exam  Vascular status intact with patient found to have discomfort in the entire heel left but I also noted more distal to the area where I took the previous lesion out I did note a keratotic lesion formation that is painful with palpation     Assessment:  Previous curettage which is done well but new lesion which is form distal along with moderate chronic plantar fasciitis     Plan:  H&P reviewed condition and deep debridement accomplished of lesion and applied chemical agent to create immune response with dressing.  Discussed the overall condition of his feet and his cement floor that he needs to walk on and he is casted for functional orthotics at the current time and will be seen back when ready

## 2019-07-09 ENCOUNTER — Other Ambulatory Visit: Payer: 59 | Admitting: Orthotics

## 2019-07-11 DIAGNOSIS — S86012A Strain of left Achilles tendon, initial encounter: Secondary | ICD-10-CM | POA: Insufficient documentation

## 2019-07-11 DIAGNOSIS — G8929 Other chronic pain: Secondary | ICD-10-CM | POA: Insufficient documentation

## 2019-07-17 ENCOUNTER — Encounter: Payer: Self-pay | Admitting: Hematology

## 2019-07-17 ENCOUNTER — Telehealth: Payer: Self-pay | Admitting: Hematology

## 2019-07-17 ENCOUNTER — Telehealth: Payer: Self-pay | Admitting: Orthotics

## 2019-07-17 NOTE — Telephone Encounter (Signed)
Advised patient fo not in.Marland KitchenMarland KitchenDawn wll call when arrive.

## 2019-07-17 NOTE — Telephone Encounter (Signed)
Received a new patient referral from Dr. Theda Sers at [facility] for positive m-spike. Mr. Mascarenas has been cld and scheduled to see Dr. Irene Limbo on 10/1 at 1pm. He's been made aware to arrive 15 minutes early.

## 2019-07-18 ENCOUNTER — Other Ambulatory Visit: Payer: 59 | Admitting: Orthotics

## 2019-07-25 ENCOUNTER — Inpatient Hospital Stay: Payer: 59

## 2019-07-25 ENCOUNTER — Other Ambulatory Visit: Payer: Self-pay

## 2019-07-25 ENCOUNTER — Inpatient Hospital Stay: Payer: 59 | Attending: Hematology | Admitting: Hematology

## 2019-07-25 VITALS — BP 147/89 | HR 99 | Temp 98.7°F | Resp 18 | Ht 72.0 in | Wt 223.5 lb

## 2019-07-25 DIAGNOSIS — D472 Monoclonal gammopathy: Secondary | ICD-10-CM

## 2019-07-25 DIAGNOSIS — R809 Proteinuria, unspecified: Secondary | ICD-10-CM

## 2019-07-25 DIAGNOSIS — F17210 Nicotine dependence, cigarettes, uncomplicated: Secondary | ICD-10-CM

## 2019-07-25 DIAGNOSIS — I10 Essential (primary) hypertension: Secondary | ICD-10-CM

## 2019-07-25 DIAGNOSIS — E119 Type 2 diabetes mellitus without complications: Secondary | ICD-10-CM | POA: Diagnosis not present

## 2019-07-25 LAB — CBC WITH DIFFERENTIAL/PLATELET
Abs Immature Granulocytes: 0.02 10*3/uL (ref 0.00–0.07)
Basophils Absolute: 0.1 10*3/uL (ref 0.0–0.1)
Basophils Relative: 1 %
Eosinophils Absolute: 0.2 10*3/uL (ref 0.0–0.5)
Eosinophils Relative: 2 %
HCT: 42 % (ref 39.0–52.0)
Hemoglobin: 13.9 g/dL (ref 13.0–17.0)
Immature Granulocytes: 0 %
Lymphocytes Relative: 25 %
Lymphs Abs: 2.9 10*3/uL (ref 0.7–4.0)
MCH: 28.7 pg (ref 26.0–34.0)
MCHC: 33.1 g/dL (ref 30.0–36.0)
MCV: 86.6 fL (ref 80.0–100.0)
Monocytes Absolute: 0.5 10*3/uL (ref 0.1–1.0)
Monocytes Relative: 5 %
Neutro Abs: 7.9 10*3/uL — ABNORMAL HIGH (ref 1.7–7.7)
Neutrophils Relative %: 67 %
Platelets: 245 10*3/uL (ref 150–400)
RBC: 4.85 MIL/uL (ref 4.22–5.81)
RDW: 13.6 % (ref 11.5–15.5)
WBC: 11.6 10*3/uL — ABNORMAL HIGH (ref 4.0–10.5)
nRBC: 0 % (ref 0.0–0.2)

## 2019-07-25 LAB — CMP (CANCER CENTER ONLY)
ALT: 38 U/L (ref 0–44)
AST: 19 U/L (ref 15–41)
Albumin: 4.1 g/dL (ref 3.5–5.0)
Alkaline Phosphatase: 106 U/L (ref 38–126)
Anion gap: 12 (ref 5–15)
BUN: 13 mg/dL (ref 6–20)
CO2: 25 mmol/L (ref 22–32)
Calcium: 9.5 mg/dL (ref 8.9–10.3)
Chloride: 103 mmol/L (ref 98–111)
Creatinine: 0.91 mg/dL (ref 0.61–1.24)
GFR, Est AFR Am: >60 mL/min (ref >60)
GFR, Estimated: >60 mL/min (ref >60)
Glucose, Bld: 99 mg/dL (ref 70–99)
Potassium: 4.3 mmol/L (ref 3.5–5.1)
Sodium: 140 mmol/L (ref 135–145)
Total Bilirubin: 0.2 mg/dL — ABNORMAL LOW (ref 0.3–1.2)
Total Protein: 7.6 g/dL (ref 6.5–8.1)

## 2019-07-25 LAB — LACTATE DEHYDROGENASE: LDH: 139 U/L (ref 98–192)

## 2019-07-25 LAB — SEDIMENTATION RATE: Sed Rate: 30 mm/hr — ABNORMAL HIGH (ref 0–16)

## 2019-07-25 NOTE — Patient Instructions (Signed)
Thank you for choosing [facility] to provide your oncology and hematology care.   Should you have questions after your visit to the [facility] ([facility]), please contact this office at [phone] between 8:30 AM and 4:30 PM. Voicemails left after 4:00 PM may not be returned until the following business day. Calls received after 4:30 PM will be answered by an off-site Nurse Triage Line.    Prescription Refills:  Please have your pharmacy contact us directly for most prescription requests.  Contact the office directly for refills of narcotics (pain medications). Allow 48-72 hours for refills.  Appointments: Please contact the [facility] scheduling department [phone] for questions regarding [facility] appointment scheduling.  Contact the schedulers with any scheduling changes so that your appointment can be rescheduled in a timely manner.   Central Scheduling for  [phone] - Call to schedule procedures such as PET scans, CT scans, MRI, Ultrasound, etc.  To afford each patient quality time with our providers, please arrive 30 minutes before your scheduled appointment time.  If you arrive late for your appointment, you may be asked to reschedule.  We strive to give you quality time with our providers, and arriving late affects you and other patients whose appointments are after yours. If you are a no show for multiple scheduled visits, you may be dismissed from the clinic at the providers discretion.     Resources: [facility] Social Workers [phone] for additional information on assistance programs --Anne Cunningham/Abigail Elmore  [place] DSS  [phone]: Information regarding food stamps, Medicaid, and utility assistance [place] [phone]   [place] [employer]'s shared-ride transportation service for eligible riders who have a disability that prevents them from riding the fixed route bus.   Medicare Rights Center 800-333-4114 Helps people with  Medicare understand their rights and benefits, navigate the Medicare system, and secure the quality healthcare they deserve [employer] 800-227-2345 Assists patients locate various types of support and financial assistance Cancer Care: 1-800-813-HOPE (4673) Provides financial assistance, online support groups, medication/co-pay assistance.      

## 2019-07-25 NOTE — Progress Notes (Signed)
HEMATOLOGY/ONCOLOGY CONSULTATION NOTE  Date of Service: 07/25/2019  Patient Care Team: [name], PA-C as PCP - General (Family Medicine)  REFERRING PHYSICIAN: [name], PA-C  CHIEF COMPLAINTS/PURPOSE OF CONSULTATION:  Positive M-Spike   HISTORY OF PRESENTING ILLNESS:   Dustin Sampson is a wonderful 53 y.o. 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  The pt reports that he is doing well overall.  The pt reports that he went to PCP and the doctor noticed an increase in protein in urine. He notes that he takes large amount of Advil 4-10 200 mg pills a day for a year or longer .    Most recent lab results (06/20/2019) of CBC and CMP is as follows: all values are WNL except for Glucose at 137 and Phosphorus at 4.5. 06/20/2019 MMP revealed an Alpha-2-Globulin of 1.3 with an M-Spike of 0.5. 06/20/2019 K/L light chains with a Kappa light chain at 34.9 and a K:L of 1.55. 06/20/2019 Albumin (Urine) at 1091.3 with an NTI:RWERX of 1026.   06/20/2019 HepB sAg screen negative. HepB Surface Ab Qual Non-Reactive. HepB core Ab Negative.  06/20/2019 HCV Ab at <0.1. HIV Screen Non-Reactive. RPR Non-Reactive.  .  He has an extensive history with elevated Creatinine: 05/19/2016 Creat 0.96 11/30/2017 Creatinine 0.98, TSH 24.10, Hgb 14.4 05/10/2018 Microalbumin/Creatinine at 1938.6 11/12/2018 Microalbumin/Creatinine at 3136.6, Creatinine 0.89.    On review of systems, pt reports gout in wrist and feet, fatigue in legs and denies bone pain leg swelling, spinal and rib pain, abdominal pain, testicular pain/swelling, abnormal weight loss and any other symptoms.   On PMHx the pt reports surgery on wrist Diabetes for 2 years. 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   On Social Hx the pt reports he lives at home with his spouse. He has two children. He is a  current smoker; he smokes half a to one pk/day.    MEDICAL HISTORY:  Past Medical History:  Diagnosis Date  . Diabetes mellitus without complication ([place])   . Hyperlipemia   . Hypertension   . Insomnia   . Thyroid disease      SURGICAL HISTORY: Past Surgical History:  Procedure Laterality Date  . COLONOSCOPY     x2  . NASAL SINUS SURGERY    . WISDOM TOOTH EXTRACTION    . WRIST SURGERY     bilat     SOCIAL HISTORY: Social History   Socioeconomic History  . Marital status: Married    Spouse name: Not on file  . Number of children: 2  . Years of education: BS  . Highest education level: Not on file  Occupational History  . Occupation: Arts development officer  Social Needs  . Financial resource strain: Not on file  . Food insecurity    Worry: Not on file    Inability: Not on file  . Transportation needs    Medical: Not on file    Non-medical: Not on file  Tobacco Use  . Smoking status: Current Every Day Smoker    Packs/day: 0.50    Types: Cigarettes  . Smokeless tobacco: Former Network engineer and Sexual Activity  . Alcohol use: No  . Drug use: No  . Sexual activity: Not on file  Lifestyle  . Physical activity    Days per week: Not on file    Minutes per session: Not on file  .  Stress: Not on file  Relationships  . Social Herbalist on phone: Not on file    Gets together: Not on file    Attends religious service: Not on file    Active member of club or organization: Not on file    Attends meetings of clubs or organizations: Not on file    Relationship status: Not on file  . Intimate partner violence    Fear of current or ex partner: Not on file    Emotionally abused: Not on file    Physically abused: Not on file    Forced sexual activity: Not on file  Other Topics Concern  . Not on file  Social History Narrative   Married, lives with wife and two teenage children. He and his sister alternate with their parents living with them every other  month. His parents are in their upper 91's. He drinks 4-5 cups of coffee a day and 2-3 sodas or glasses of tea. He does not get any regular exercise.     FAMILY HISTORY: Family History  Problem Relation Age of Onset  . Colon cancer Father      ALLERGIES:   is allergic to aspirin; avelox [moxifloxacin hcl in nacl]; and penicillins.   MEDICATIONS:  Current Outpatient Medications  Medication Sig Dispense Refill  . atorvastatin (LIPITOR) 10 MG tablet Take 10 mg by mouth daily.    . fluorouracil (EFUDEX) 5 % cream Apply topically 2 (two) times daily. 40 g 1  . gabapentin (NEURONTIN) 300 MG capsule TAKE 1 CAPSULE BY MOUTH 3 TIMES A DAY AS NEEDED FOR NEUROPATHY    . levothyroxine (SYNTHROID, LEVOTHROID) 200 MCG tablet Take 200 mcg by mouth daily before breakfast.    . losartan (COZAAR) 50 MG tablet Take 50 mg by mouth daily.    . methylPREDNISolone (MEDROL DOSEPAK) 4 MG TBPK tablet follow package directions 21 tablet 0  . temazepam (RESTORIL) 30 MG capsule Take 30 mg by mouth at bedtime as needed for sleep.     No current facility-administered medications for this visit.      REVIEW OF SYSTEMS:   A 10+ POINT REVIEW OF SYSTEMS WAS OBTAINED including neurology, dermatology, psychiatry, cardiac, respiratory, lymph, extremities, GI, GU, Musculoskeletal, constitutional, breasts, reproductive, HEENT.  All pertinent positives are noted in the HPI.  All others are negative.    PHYSICAL EXAMINATION: ECOG PERFORMANCE STATUS: 1 - Symptomatic but completely ambulatory  Vitals:   07/25/19 1321  BP: (!) 147/89  Pulse: 99  Resp: 18  Temp: 98.7 F (37.1 C)  SpO2: 99%   Filed Weights   07/25/19 1321  Weight: 223 lb 8 oz (101.4 kg)   Body mass index is 30.31 kg/m.  GENERAL:alert, in no acute distress and comfortable SKIN: no acute rashes, no significant lesions EYES: conjunctiva are pink and non-injected, sclera anicteric OROPHARYNX: MMM, no exudates, no oropharyngeal erythema or  ulceration NECK: supple, no JVD LYMPH:  no palpable lymphadenopathy in the cervical, axillary or inguinal regions LUNGS: clear to auscultation b/l with normal respiratory effort HEART: regular rate & rhythm ABDOMEN:  normoactive bowel sounds , non tender, not distended. Extremity: no pedal edema PSYCH: alert & oriented x 3 with fluent speech NEURO: no focal motor/sensory deficits   LABORATORY DATA:  I have reviewed the data as listed  CBC Latest Ref Rng & Units 07/25/2019 06/20/2019  WBC 4.0 - 10.5 K/uL 11.6(H) 10.5  Hemoglobin 13.0 - 17.0 g/dL 13.9 13.4  Hematocrit 39.0 - 52.0 %  42.0 40.4  Platelets 150 - 400 K/uL 245 255   . CMP Latest Ref Rng & Units 07/25/2019  Glucose 70 - 99 mg/dL 99  BUN 6 - 20 mg/dL 13  Creatinine 0.61 - 1.24 mg/dL 0.91  Sodium 135 - 145 mmol/L 140  Potassium 3.5 - 5.1 mmol/L 4.3  Chloride 98 - 111 mmol/L 103  CO2 22 - 32 mmol/L 25  Calcium 8.9 - 10.3 mg/dL 9.5  Total Protein 6.5 - 8.1 g/dL 7.6  Total Bilirubin 0.3 - 1.2 mg/dL 0.2(L)  Alkaline Phos 38 - 126 U/L 106  AST 15 - 41 U/L 19  ALT 0 - 44 U/L 38     No flowsheet data found.  06/20/2019 Labs   RADIOGRAPHIC STUDIES: I have personally reviewed the radiological images as listed and agreed with the findings in the report. No results found.   ASSESSMENT & PLAN:  Dustin Sampson is a 53 y.o. male with:  1. Monoclonal Paraproteinemia 2. Proteinuria ? Related to HTN, DM2 vs AL Amyloidosis or other plasma cell dyscrasia   PLAN: -Discussed patient's most recent labs from  (06/20/2019), all values are WNL except for Glucose at 137 and Phosphorus at 4.5. 06/20/2019 MMP revealed an Alpha-2-Globulin of 1.3 with an M-Spike of 0.5. 06/20/2019 K/L light chains with a Kappa light chain at 34.9 and a K:L of 1.55. 06/20/2019 Albumin (Urine) at 1091.3 with an GNO:IBBCW of 1026.   06/20/2019 HepB sAg screen negative. HepB Surface Ab Qual Non-Reactive. HepB core Ab Negative.  06/20/2019 HCV Ab at <0.1. HIV  Screen Non-Reactive. RPR Non-Reactive.  -Discussed what a M-Spike is. Pt was advised that multiple myeloma is not likely but is a possibility. Also discussed that diabetes could cause in protein in urine. -Discussed the option for kidney biopsy for the proteinuria-- decision will be deferred to nephrology regarding this. -Suggested taking thyroid medication by itself only with water, no other medication, and take it in the morning, and do no eat anything afterwards. -Discussed CRAB criteria and advised that he does not meet the criteria.   -Suggested blood test, urine test, full body X-Ray and bone marrow biopsy, pt wants to schedule all test and will cancel if Nephrologists advices other wise. -Pt has follow up with Nephrologists today and will discuss if kidney biopsy or bone marrow biopsy is necessary.  -advised if it is amyloidosis it will be treated similar to cancer to cut down the level of protein being produced  -Pt wants to follow up with phone visitation    FOLLOW UP Labs today Whole body bone survey in 1 week CT bone marrow biopsy in 7-10 days Phone visit with Dr Irene Limbo in 3weeks  All of the patients questions were answered with apparent satisfaction. The patient knows to call the clinic with any problems, questions or concerns.   The total time spent in the appointment was 45 minutes and more than 50% was on counseling and direct patient cares.    Sullivan Lone MD MS AAHIVMS [facility] Hematology/Oncology Physician [facility]  (Office):       [phone] (Work cell):  [phone] (Fax):           [phone]  07/25/2019 1:43 AM  I, Jacqualyn Posey, am acting as a Education administrator for Dr. Sullivan Lone.   .I have reviewed the above documentation for accuracy and completeness, and I agree with the above. Brunetta Genera MD

## 2019-07-26 LAB — KAPPA/LAMBDA LIGHT CHAINS
Kappa free light chain: 28.6 mg/L — ABNORMAL HIGH (ref 3.3–19.4)
Kappa, lambda light chain ratio: 1.78 — ABNORMAL HIGH (ref 0.26–1.65)
Lambda free light chains: 16.1 mg/L (ref 5.7–26.3)

## 2019-07-26 LAB — BETA 2 MICROGLOBULIN, SERUM: Beta-2 Microglobulin: 1.6 mg/L (ref 0.6–2.4)

## 2019-07-28 LAB — MULTIPLE MYELOMA PANEL, SERUM
Albumin SerPl Elph-Mcnc: 3.7 g/dL (ref 2.9–4.4)
Albumin/Glob SerPl: 1.1 (ref 0.7–1.7)
Alpha 1: 0.2 g/dL (ref 0.0–0.4)
Alpha2 Glob SerPl Elph-Mcnc: 1.2 g/dL — ABNORMAL HIGH (ref 0.4–1.0)
B-Globulin SerPl Elph-Mcnc: 1 g/dL (ref 0.7–1.3)
Gamma Glob SerPl Elph-Mcnc: 1 g/dL (ref 0.4–1.8)
Globulin, Total: 3.4 g/dL (ref 2.2–3.9)
IgA: 212 mg/dL (ref 90–386)
IgG (Immunoglobin G), Serum: 972 mg/dL (ref 603–1613)
IgM (Immunoglobulin M), Srm: 59 mg/dL (ref 20–172)
M Protein SerPl Elph-Mcnc: 0.5 g/dL — ABNORMAL HIGH
Total Protein ELP: 7.1 g/dL (ref 6.0–8.5)

## 2019-07-29 ENCOUNTER — Ambulatory Visit: Payer: 59 | Admitting: Orthotics

## 2019-07-29 ENCOUNTER — Other Ambulatory Visit: Payer: Self-pay

## 2019-07-29 DIAGNOSIS — M79671 Pain in right foot: Secondary | ICD-10-CM

## 2019-07-29 DIAGNOSIS — D472 Monoclonal gammopathy: Secondary | ICD-10-CM | POA: Diagnosis not present

## 2019-07-29 DIAGNOSIS — B070 Plantar wart: Secondary | ICD-10-CM

## 2019-07-29 DIAGNOSIS — M722 Plantar fascial fibromatosis: Secondary | ICD-10-CM

## 2019-07-29 NOTE — Progress Notes (Signed)
Patient came in today to pick up custom made foot orthotics.  The goals were accomplished and the patient reported no dissatisfaction with said orthotics.  Patient was advised of breakin period and how to report any issues. 

## 2019-07-30 LAB — UPEP/UIFE/LIGHT CHAINS/TP, 24-HR UR
% BETA, Urine: 7.3 %
ALPHA 1 URINE: 3.6 %
Albumin, U: 83.7 %
Alpha 2, Urine: 2.7 %
Free Kappa Lt Chains,Ur: 29.98 mg/L (ref 0.63–113.79)
Free Kappa/Lambda Ratio: 13.03 (ref 1.03–31.76)
Free Lambda Lt Chains,Ur: 2.3 mg/L (ref 0.47–11.77)
GAMMA GLOBULIN URINE: 2.6 %
M-SPIKE %, Urine: 1 % — ABNORMAL HIGH
M-Spike, Mg/24 Hr: 14 mg/24 hr — ABNORMAL HIGH
Total Protein, Urine-Ur/day: 1360 mg/24 hr — ABNORMAL HIGH (ref 30–150)
Total Protein, Urine: 93.8 mg/dL
Total Volume: 1450

## 2019-08-02 ENCOUNTER — Other Ambulatory Visit: Payer: Self-pay | Admitting: Radiology

## 2019-08-02 ENCOUNTER — Ambulatory Visit (HOSPITAL_COMMUNITY)
Admission: RE | Admit: 2019-08-02 | Discharge: 2019-08-02 | Disposition: A | Discharge status: Home / Self Care | Payer: 59 | Source: Ambulatory Visit | Attending: Hematology | Admitting: Hematology

## 2019-08-02 ENCOUNTER — Other Ambulatory Visit: Payer: Self-pay

## 2019-08-02 DIAGNOSIS — R809 Proteinuria, unspecified: Secondary | ICD-10-CM | POA: Insufficient documentation

## 2019-08-02 DIAGNOSIS — D472 Monoclonal gammopathy: Secondary | ICD-10-CM | POA: Insufficient documentation

## 2019-08-05 ENCOUNTER — Encounter (HOSPITAL_COMMUNITY): Payer: Self-pay

## 2019-08-05 ENCOUNTER — Observation Stay (HOSPITAL_COMMUNITY)
Admission: RE | Admit: 2019-08-05 | Discharge: 2019-08-05 | Disposition: A | Discharge status: Home / Self Care | Payer: 59 | Source: Ambulatory Visit | Attending: Hematology | Admitting: Hematology

## 2019-08-05 ENCOUNTER — Other Ambulatory Visit: Payer: Self-pay

## 2019-08-05 ENCOUNTER — Ambulatory Visit (HOSPITAL_COMMUNITY)
Admission: RE | Admit: 2019-08-05 | Discharge: 2019-08-05 | Disposition: A | Discharge status: Home / Self Care | Payer: 59 | Source: Ambulatory Visit | Attending: Hematology | Admitting: Hematology

## 2019-08-05 DIAGNOSIS — E785 Hyperlipidemia, unspecified: Secondary | ICD-10-CM | POA: Insufficient documentation

## 2019-08-05 DIAGNOSIS — Z886 Allergy status to analgesic agent status: Secondary | ICD-10-CM | POA: Diagnosis not present

## 2019-08-05 DIAGNOSIS — I10 Essential (primary) hypertension: Secondary | ICD-10-CM | POA: Diagnosis not present

## 2019-08-05 DIAGNOSIS — Z880 Allergy status to penicillin: Secondary | ICD-10-CM | POA: Diagnosis not present

## 2019-08-05 DIAGNOSIS — Z79890 Hormone replacement therapy (postmenopausal): Secondary | ICD-10-CM | POA: Diagnosis not present

## 2019-08-05 DIAGNOSIS — E119 Type 2 diabetes mellitus without complications: Secondary | ICD-10-CM | POA: Diagnosis not present

## 2019-08-05 DIAGNOSIS — E079 Disorder of thyroid, unspecified: Secondary | ICD-10-CM | POA: Insufficient documentation

## 2019-08-05 DIAGNOSIS — D472 Monoclonal gammopathy: Secondary | ICD-10-CM

## 2019-08-05 DIAGNOSIS — Z79899 Other long term (current) drug therapy: Secondary | ICD-10-CM | POA: Diagnosis not present

## 2019-08-05 DIAGNOSIS — F17210 Nicotine dependence, cigarettes, uncomplicated: Secondary | ICD-10-CM | POA: Diagnosis not present

## 2019-08-05 DIAGNOSIS — R809 Proteinuria, unspecified: Secondary | ICD-10-CM | POA: Diagnosis present

## 2019-08-05 LAB — CBC WITH DIFFERENTIAL/PLATELET
Abs Immature Granulocytes: 0.02 10*3/uL (ref 0.00–0.07)
Basophils Absolute: 0.1 10*3/uL (ref 0.0–0.1)
Basophils Relative: 1 %
Eosinophils Absolute: 0.3 10*3/uL (ref 0.0–0.5)
Eosinophils Relative: 3 %
HCT: 41.6 % (ref 39.0–52.0)
Hemoglobin: 13.8 g/dL (ref 13.0–17.0)
Immature Granulocytes: 0 %
Lymphocytes Relative: 24 %
Lymphs Abs: 2.4 10*3/uL (ref 0.7–4.0)
MCH: 28.6 pg (ref 26.0–34.0)
MCHC: 33.2 g/dL (ref 30.0–36.0)
MCV: 86.3 fL (ref 80.0–100.0)
Monocytes Absolute: 0.6 10*3/uL (ref 0.1–1.0)
Monocytes Relative: 6 %
Neutro Abs: 6.6 10*3/uL (ref 1.7–7.7)
Neutrophils Relative %: 66 %
Platelets: 267 10*3/uL (ref 150–400)
RBC: 4.82 MIL/uL (ref 4.22–5.81)
RDW: 13.6 % (ref 11.5–15.5)
WBC: 9.9 10*3/uL (ref 4.0–10.5)
nRBC: 0 % (ref 0.0–0.2)

## 2019-08-05 LAB — GLUCOSE, CAPILLARY: Glucose-Capillary: 129 mg/dL — ABNORMAL HIGH (ref 70–99)

## 2019-08-05 LAB — PROTIME-INR
INR: 0.8 (ref 0.8–1.2)
Prothrombin Time: 11.4 seconds (ref 11.4–15.2)

## 2019-08-05 MED ORDER — SODIUM CHLORIDE 0.9 % IV SOLN
INTRAVENOUS | Status: DC
  Administered 2019-08-05: 10:00:00 via INTRAVENOUS

## 2019-08-05 MED ORDER — FENTANYL CITRATE (PF) 100 MCG/2ML IJ SOLN
INTRAMUSCULAR | Status: AC
  Filled 2019-08-05: qty 4

## 2019-08-05 MED ORDER — MIDAZOLAM HCL 2 MG/2ML IJ SOLN
INTRAMUSCULAR | Status: AC
  Filled 2019-08-05: qty 4

## 2019-08-05 MED ORDER — MIDAZOLAM HCL 2 MG/2ML IJ SOLN
INTRAMUSCULAR | Status: AC | PRN
  Administered 2019-08-05 (×3): 1 mg via INTRAVENOUS

## 2019-08-05 MED ORDER — FENTANYL CITRATE (PF) 100 MCG/2ML IJ SOLN
INTRAMUSCULAR | Status: AC | PRN
  Administered 2019-08-05 (×3): 50 ug via INTRAVENOUS

## 2019-08-05 MED ORDER — HEPARIN SOD (PORK) LOCK FLUSH 100 UNIT/ML IV SOLN: 500.0000 [IU] | Freq: Once | INTRAVENOUS | Status: DC

## 2019-08-05 NOTE — H&P (Signed)
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  He also has proteinuria as well as positive M spike/monoclonal paraproteinemia.  He presents today for CT-guided bone marrow biopsy for further evaluation.  He currently denies fever, headache, chest pain, dyspnea, cough, abdominal/back pain, nausea, vomiting or bleeding. He does have some nasal congestion and continues to smoke.    Past Medical History:  Diagnosis Date  . Diabetes mellitus without complication ([place])   . Hyperlipemia   . Hypertension   . Insomnia   . Thyroid disease    Past Surgical History:  Procedure Laterality Date  . COLONOSCOPY     x2  . NASAL SINUS SURGERY    . WISDOM TOOTH EXTRACTION    . WRIST SURGERY     bilat      Allergies: Aspirin, Avelox [moxifloxacin hcl in nacl], and Penicillins  Medications: Prior to Admission medications   Medication Sig Start Date End Date Taking? Authorizing Provider  atorvastatin (LIPITOR) 10 MG tablet Take 10 mg by mouth daily.    [provider]  fluorouracil (EFUDEX) 5 % cream Apply topically 2 (two) times daily. 05/27/19   Wallene Huh, DPM  gabapentin (NEURONTIN) 300 MG capsule TAKE 1 CAPSULE BY MOUTH 3 TIMES A DAY AS NEEDED FOR NEUROPATHY 02/26/19   [provider]  levothyroxine (SYNTHROID, LEVOTHROID) 200 MCG tablet Take 200 mcg by mouth daily before breakfast.    [provider]  losartan (COZAAR) 50 MG tablet Take 50 mg by mouth daily.    [provider]  methylPREDNISolone (MEDROL DOSEPAK) 4 MG TBPK tablet follow package directions 06/21/19   Wallene Huh, DPM  temazepam (RESTORIL) 30 MG capsule Take 30 mg by mouth at bedtime as needed for sleep.    [provider]     Vital  Signs: Blood pressure 165/98, heart rate 88, temp 98, respirations 16, O2 sat 96% room air   Physical Exam awake, alert.  Chest with occasional inspiratory wheeze, few rhonchi.  Heart with normal rate, occasional ectopy.  Abdomen soft, positive bowel sounds, nontender.  No lower extremity edema.  Imaging: Dg Bone Survey Met  Result Date: 08/02/2019 CLINICAL DATA:  Monoclonal paraproteinemia. EXAM: METASTATIC BONE SURVEY COMPARISON:  None. FINDINGS: Degenerative changes are seen involving the left knee. No lytic or sclerotic lesions are noted in the skull, spine, rib cage, pelvis or extremities. IMPRESSION: No lytic or sclerotic lesions are noted in the skeleton. Electronically Signed   By: Marijo Conception M.D.   On: 08/02/2019 16:32    Labs:  CBC: Recent Labs    06/20/19 1108 07/25/19 1438  WBC 10.5 11.6*  HGB 13.4 13.9  HCT 40.4 42.0  PLT 255 245    COAGS: Recent Labs    06/20/19 1108  INR 0.9    BMP: Recent Labs    07/25/19 1438  NA 140  K 4.3  CL 103  CO2 25  GLUCOSE 99  BUN 13  CALCIUM 9.5  CREATININE 0.91  GFRNONAA >60  GFRAA >60    LIVER FUNCTION TESTS: Recent Labs    07/25/19 1438  BILITOT 0.2*  AST 19  ALT 38  ALKPHOS 106  PROT 7.6  ALBUMIN 4.1    Assessment and Plan: Pt with hx proteinuria as well  as positive M spike/monoclonal paraproteinemia.  He presents today for CT-guided bone marrow biopsy for further evaluation.Risks and benefits of procedure was discussed with the patient  including, but not limited to bleeding, infection, damage to adjacent structures or low yield requiring additional tests.  All of the questions were answered and there is agreement to proceed.  Consent signed and in chart.      Electronically Signed: D. Rowe Robert, PA-C 08/05/2019, 9:34 AM   I spent a total of 20 minutes at the the patient's bedside AND on the patient's hospital floor or unit, greater than 50% of which was counseling/coordinating care for  CT-guided bone marrow biopsy

## 2019-08-05 NOTE — Discharge Instructions (Signed)
Moderate Conscious Sedation, Adult, Care After °These instructions provide you with information about caring for yourself after your procedure. Your health care provider may also give you more specific instructions. Your treatment has been planned according to current medical practices, but problems sometimes occur. Call your health care provider if you have any problems or questions after your procedure. °What can I expect after the procedure? °After your procedure, it is common: °· To feel sleepy for several hours. °· To feel clumsy and have poor balance for several hours. °· To have poor judgment for several hours. °· To vomit if you eat too soon. °Follow these instructions at home: °For at least 24 hours after the procedure: ° °· Do not: °? Participate in activities where you could fall or become injured. °? Drive. °? Use heavy machinery. °? Drink alcohol. °? Take sleeping pills or medicines that cause drowsiness. °? Make important decisions or sign legal documents. °? Take care of children on your own. °· Rest. °Eating and drinking °· Follow the diet recommended by your health care provider. °· If you vomit: °? Drink water, juice, or soup when you can drink without vomiting. °? Make sure you have little or no nausea before eating solid foods. °General instructions °· Have a responsible adult stay with you until you are awake and alert. °· Take over-the-counter and prescription medicines only as told by your health care provider. °· If you smoke, do not smoke without supervision. °· Keep all follow-up visits as told by your health care provider. This is important. °Contact a health care provider if: °· You keep feeling nauseous or you keep vomiting. °· You feel light-headed. °· You develop a rash. °· You have a fever. °Get help right away if: °· You have trouble breathing. °This information is not intended to replace advice given to you by your health care provider. Make sure you discuss any questions you have  with your health care provider. °Document Released: 07/31/2013 Document Revised: 09/22/2017 Document Reviewed: 01/30/2016 °Elsevier Patient Education © 2020 Elsevier Inc[employer] ° ° °Bone Marrow Aspiration and Bone Marrow Biopsy, Adult, Care After °This sheet gives you information about how to care for yourself after your procedure. Your health care provider may also give you more specific instructions. If you have problems or questions, contact your health care provider. °What can I expect after the procedure? °After the procedure, it is common to have: °· Mild pain and tenderness. °· Swelling. °· Bruising. °Follow these instructions at home: °Puncture site care ° °  ° °· Follow instructions from your health care provider about how to take care of the puncture site. Make sure you: °? Wash your hands with soap and water before you change your bandage (dressing). If soap and water are not available, use hand sanitizer. °? Change your dressing as told by your health care provider. °· Check your puncture site every day for signs of infection. Check for: °? More redness, swelling, or pain. °? More fluid or blood. °? Warmth. °? Pus or a bad smell. °General instructions °· Take over-the-counter and prescription medicines only as told by your health care provider. °· Do not take baths, swim, or use a hot tub until your health care provider approves. Ask if you can take a shower or have a sponge bath. °· Return to your normal activities as told by your health care provider. Ask your health care provider what activities are safe for you. °· Do not drive for 24 hours if you were   medicine to help you relax (sedative) during your procedure.  Keep all follow-up visits as told by your health care provider. This is important. Contact a health care provider if:  Your pain is not controlled with medicine. Get help right away if:  You have a fever.  You have more redness, swelling, or pain around the puncture site.  You have  more fluid or blood coming from the puncture site.  Your puncture site feels warm to the touch.  You have pus or a bad smell coming from the puncture site. These symptoms may represent a serious problem that is an emergency. Do not wait to see if the symptoms will go away. Get medical help right away. Call your local emergency services (911 in the [place] Do not drive yourself to the hospital. Summary  After the procedure, it is common to have mild pain, tenderness, swelling, and bruising.  Follow instructions from your health care provider about how to take care of the puncture site.  Get help right away if you have any symptoms of infection or if you have more blood or fluid coming from the puncture site. This information is not intended to replace advice given to you by your health care provider. Make sure you discuss any questions you have with your health care provider. Document Released: 04/29/2005 Document Revised: 01/23/2018 Document Reviewed: 03/23/2016 Elsevier Patient Education  [employer].   Moderate Conscious Sedation, Adult, Care After These instructions provide you with information about caring for yourself after your procedure. Your health care provider may also give you more specific instructions. Your treatment has been planned according to current medical practices, but problems sometimes occur. Call your health care provider if you have any problems or questions after your procedure. What can I expect after the procedure? After your procedure, it is common:  To feel sleepy for several hours.  To feel clumsy and have poor balance for several hours.  To have poor judgment for several hours.  To vomit if you eat too soon. Follow these instructions at home: For at least 24 hours after the procedure:   Do not: ? Participate in activities where you could fall or become injured. ? Drive. ? Use heavy machinery. ? Drink alcohol. ? Take sleeping pills or  medicines that cause drowsiness. ? Make important decisions or sign legal documents. ? Take care of children on your own.  Rest. Eating and drinking  Follow the diet recommended by your health care provider.  If you vomit: ? Drink water, juice, or soup when you can drink without vomiting. ? Make sure you have little or no nausea before eating solid foods. General instructions  Have a responsible adult stay with you until you are awake and alert.  Take over-the-counter and prescription medicines only as told by your health care provider.  If you smoke, do not smoke without supervision.  Keep all follow-up visits as told by your health care provider. This is important. Contact a health care provider if:  You keep feeling nauseous or you keep vomiting.  You feel light-headed.  You develop a rash.  You have a fever. Get help right away if:  You have trouble breathing. This information is not intended to replace advice given to you by your health care provider. Make sure you discuss any questions you have with your health care provider. Document Released: 07/31/2013 Document Revised: 09/22/2017 Document Reviewed: 01/30/2016 Elsevier Patient Education  2020 [employer].

## 2019-08-05 NOTE — Procedures (Signed)
Pre-procedure Diagnosis: Monoclonal paraproteinemia Post-procedure Diagnosis: Same  Technically successful CT guided bone marrow aspiration and biopsy of left iliac crest.   Complications: None Immediate  EBL: None  Signed: Sandi Mariscal Pager: [phone] 08/05/2019, 11:35 AM

## 2019-08-08 ENCOUNTER — Ambulatory Visit: Payer: 59 | Admitting: Hematology

## 2019-08-08 LAB — SURGICAL PATHOLOGY

## 2019-08-14 ENCOUNTER — Telehealth: Payer: Self-pay | Admitting: Hematology

## 2019-08-14 ENCOUNTER — Encounter (HOSPITAL_COMMUNITY): Payer: Self-pay | Admitting: Hematology

## 2019-08-14 NOTE — Progress Notes (Signed)
HEMATOLOGY/ONCOLOGY CONSULTATION NOTE  Date of Service: 08/15/2019  Patient Care Team: [name], PA-C as PCP - General (Family Medicine) Brunetta Genera, MD as Consulting Physician (Hematology)  REFERRING PHYSICIAN: [name], PA-C  CHIEF COMPLAINTS/PURPOSE OF CONSULTATION:  Positive M-Spike   HISTORY OF PRESENTING ILLNESS:   Dustin Sampson is a wonderful 53 y.o. 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  The pt reports that he is doing well overall.  The pt reports that he went to PCP and the doctor noticed an increase in protein in urine. He notes that he takes large amount of Advil 4-10 200 mg pills a day for a year or longer .    The pt reports having diabetes for 5 years and hypertension for 10 years   Most recent lab results (06/20/2019) of CBC and CMP is as follows: all values are WNL except for Glucose at 137 and Phosphorus at 4.5. 06/20/2019 MMP revealed an Alpha-2-Globulin of 1.3 with an M-Spike of 0.5. 06/20/2019 K/L light chains with a Kappa light chain at 34.9 and a K:L of 1.55. 06/20/2019 Albumin (Urine) at 1091.3 with an UTM:LYYTK of 1026.   06/20/2019 HepB sAg screen negative. HepB Surface Ab Qual Non-Reactive. HepB core Ab Negative.  06/20/2019 HCV Ab at <0.1. HIV Screen Non-Reactive. RPR Non-Reactive.  .  He has an extensive history with elevated Creatinine: 05/19/2016 Creat 0.96 11/30/2017 Creatinine 0.98, TSH 24.10, Hgb 14.4 05/10/2018 Microalbumin/Creatinine at 1938.6 11/12/2018 Microalbumin/Creatinine at 3136.6, Creatinine 0.89.    On PMHx the pt reports surgery on wrist Diabetes for 2 years. 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   On Social Hx the pt reports he lives at home with his spouse. He has two children. He is a current smoker; he smokes half a to one pk/day.   INTERVAL HISTORY:  Dustin Sampson is a 53 y.o. male here for evaluation and management of monoclonal paraproteinemia.. The patient's last visit with [name] was on 07/25/2019. The pt reports that he is doing well overall.  I connected with  Dustin Sampson on 08/15/19 by a video enabled telemedicine application and verified that I am speaking with the correct person using two identifiers.   I discussed the limitations of evaluation and management by telemedicine. The patient expressed understanding and agreed to proceed.  The pt reports he is doing well overall. He is still taking medication for neuropathy but diabetes has not been determine as the cause.   Of note since the patient's last visit, pt has had CT-GUIDED BONE MARROW BIOPSY AND ASPIRATION completed on 08/05/2019.  Of note since the patient's last visit, pt has had Cytogenetics analysis  completed on 08/05/2019 with results revealing "Normal Male Karyotype."   MEDICAL HISTORY:  Past Medical History:  Diagnosis Date   Diabetes mellitus without complication ([place])    Hyperlipemia    Hypertension    Insomnia    Thyroid disease      SURGICAL HISTORY: Past Surgical History:  Procedure Laterality Date   COLONOSCOPY     x2   NASAL SINUS SURGERY     WISDOM TOOTH EXTRACTION     WRIST SURGERY     bilat     SOCIAL HISTORY: Social History   Socioeconomic History   Marital status: Married    Spouse name: Not on file   Number of children: 2   Years  of education: BS   Highest education level: Not on file  Occupational History   Occupation: Chief Technology Officer strain: Not on file   Food insecurity    Worry: Not on file    Inability: Not on file   Transportation needs    Medical: Not on file    Non-medical: Not on file  Tobacco Use   Smoking status: Current Every Day Smoker    Packs/day: 0.50    Types: Cigarettes   Smokeless tobacco: Former Network engineer and Sexual Activity   Alcohol use: No    Drug use: No   Sexual activity: Not on file  Lifestyle   Physical activity    Days per week: Not on file    Minutes per session: Not on file   Stress: Not on file  Relationships   Social connections    Talks on phone: Not on file    Gets together: Not on file    Attends religious service: Not on file    Active member of club or organization: Not on file    Attends meetings of clubs or organizations: Not on file    Relationship status: Not on file   Intimate partner violence    Fear of current or ex partner: Not on file    Emotionally abused: Not on file    Physically abused: Not on file    Forced sexual activity: Not on file  Other Topics Concern   Not on file  Social History Narrative   Married, lives with wife and two teenage children. He and his sister alternate with their parents living with them every other month. His parents are in their upper 34's. He drinks 4-5 cups of coffee a day and 2-3 sodas or glasses of tea. He does not get any regular exercise.     FAMILY HISTORY: Family History  Problem Relation Age of Onset   Colon cancer Father      ALLERGIES:   is allergic to aspirin; avelox [moxifloxacin hcl in nacl]; and penicillins.   MEDICATIONS:  Current Outpatient Medications  Medication Sig Dispense Refill   atorvastatin (LIPITOR) 10 MG tablet Take 10 mg by mouth daily.     fluorouracil (EFUDEX) 5 % cream Apply topically 2 (two) times daily. 40 g 1   gabapentin (NEURONTIN) 300 MG capsule TAKE 1 CAPSULE BY MOUTH 3 TIMES A DAY AS NEEDED FOR NEUROPATHY     levothyroxine (SYNTHROID, LEVOTHROID) 200 MCG tablet Take 200 mcg by mouth daily before breakfast.     losartan (COZAAR) 50 MG tablet Take 50 mg by mouth daily.     methylPREDNISolone (MEDROL DOSEPAK) 4 MG TBPK tablet follow package directions 21 tablet 0   pseudoephedrine (SUDAFED) 60 MG tablet Take 120 mg by mouth every 4 (four) hours as needed for congestion (q 12 hr/ extended release tab).       temazepam (RESTORIL) 30 MG capsule Take 30 mg by mouth at bedtime as needed for sleep.     No current facility-administered medications for this visit.      REVIEW OF SYSTEMS:   A 10+ POINT REVIEW OF SYSTEMS WAS OBTAINED including neurology, dermatology, psychiatry, cardiac, respiratory, lymph, extremities, GI, GU, Musculoskeletal, constitutional, breasts, reproductive, HEENT.  All pertinent positives are noted in the HPI.  All others are negative.     PHYSICAL EXAMINATION: There were no vitals filed for this visit. Wt Readings from Last 3 Encounters:  08/05/19 225 lb (102.1  kg)  07/25/19 223 lb 8 oz (101.4 kg)  08/23/17 235 lb 6.4 oz (106.8 kg)   There is no height or weight on file to calculate BMI.    Telehealth Visit 08/14/19    LABORATORY DATA:  I have reviewed the data as listed  CBC Latest Ref Rng & Units 08/05/2019 07/25/2019 06/20/2019  WBC 4.0 - 10.5 K/uL 9.9 11.6(H) 10.5  Hemoglobin 13.0 - 17.0 g/dL 13.8 13.9 13.4  Hematocrit 39.0 - 52.0 % 41.6 42.0 40.4  Platelets 150 - 400 K/uL 267 245 255   . CMP Latest Ref Rng & Units 07/25/2019  Glucose 70 - 99 mg/dL 99  BUN 6 - 20 mg/dL 13  Creatinine 0.61 - 1.24 mg/dL 0.91  Sodium 135 - 145 mmol/L 140  Potassium 3.5 - 5.1 mmol/L 4.3  Chloride 98 - 111 mmol/L 103  CO2 22 - 32 mmol/L 25  Calcium 8.9 - 10.3 mg/dL 9.5  Total Protein 6.5 - 8.1 g/dL 7.6  Total Bilirubin 0.3 - 1.2 mg/dL 0.2(L)  Alkaline Phos 38 - 126 U/L 106  AST 15 - 41 U/L 19  ALT 0 - 44 U/L 38   . CMP Latest Ref Rng & Units 07/25/2019  Glucose 70 - 99 mg/dL 99  BUN 6 - 20 mg/dL 13  Creatinine 0.61 - 1.24 mg/dL 0.91  Sodium 135 - 145 mmol/L 140  Potassium 3.5 - 5.1 mmol/L 4.3  Chloride 98 - 111 mmol/L 103  CO2 22 - 32 mmol/L 25  Calcium 8.9 - 10.3 mg/dL 9.5  Total Protein 6.5 - 8.1 g/dL 7.6  Total Bilirubin 0.3 - 1.2 mg/dL 0.2(L)  Alkaline Phos 38 - 126 U/L 106  AST 15 - 41 U/L 19  ALT 0 - 44 U/L 38   Component     Latest Ref Rng & Units  07/25/2019 07/29/2019  Total Protein, Urine-UPE24     Not Estab. mg/dL  93.8  Total Protein, Urine-Ur/day     30 - 150 mg/24 hr  1,360 (H)  ALBUMIN, U     %  83.7  ALPHA 1 URINE     %  3.6  Alpha 2, Urine     %  2.7  % BETA, Urine     %  7.3  GAMMA GLOBULIN URINE     %  2.6  Free Kappa Lt Chains,Ur     0.63 - 113.79 mg/L  29.98  Free Lambda Lt Chains,Ur     0.47 - 11.77 mg/L  2.30  Free Kappa/Lambda Ratio     1.03 - 31.76  13.03  Immunofixation Result, Urine       Comment  Total Volume       1,450  M-SPIKE %, Urine     Not Observed %  1.0 (H)  M-Spike, mg/24 hr     Not Observed mg/24 hr  14 (H)  NOTE:       Comment  IgG (Immunoglobin G), Serum     603 - 1,613 mg/dL 972   IgA     90 - 386 mg/dL 212   IgM (Immunoglobulin M), Srm     20 - 172 mg/dL 59   Total Protein ELP     6.0 - 8.5 g/dL 7.1   Albumin SerPl Elph-Mcnc     2.9 - 4.4 g/dL 3.7   Alpha 1     0.0 - 0.4 g/dL 0.2   Alpha2 Glob SerPl Elph-Mcnc     0.4 - 1.0 g/dL 1.2 (  H)   B-Globulin SerPl Elph-Mcnc     0.7 - 1.3 g/dL 1.0   Gamma Glob SerPl Elph-Mcnc     0.4 - 1.8 g/dL 1.0   M Protein SerPl Elph-Mcnc     Not Observed g/dL 0.5 (H)   Globulin, Total     2.2 - 3.9 g/dL 3.4   Albumin/Glob SerPl     0.7 - 1.7 1.1   IFE 1      Comment   Please Note (HCV):      Comment   Kappa free light chain     3.3 - 19.4 mg/L 28.6 (H)   Lamda free light chains     5.7 - 26.3 mg/L 16.1   Kappa, lamda light chain ratio     0.26 - 1.65 1.78 (H)   Beta-2 Microglobulin     0.6 - 2.4 mg/L 1.6   LDH     98 - 192 U/L 139   Sed Rate     0 - 16 mm/hr 30 (H)     08/05/2019([id number])CT-GUIDED BONE MARROW BIOPSY AND ASPIRATION    08/05/2019 Cytogenetics analysis     06/20/2019 Labs   RADIOGRAPHIC STUDIES: I have personally reviewed the radiological images as listed and agreed with the findings in the report. Ct Biopsy  Result Date: 08/05/2019 INDICATION: Monoclonal paraproteinemia. Please perform  CT-guided bone marrow biopsy for tissue diagnostic purposes. EXAM: CT-GUIDED BONE MARROW BIOPSY AND ASPIRATION MEDICATIONS: None ANESTHESIA/SEDATION: Fentanyl 150 mcg IV; Versed 3 mg IV Sedation Time: 11 Minutes; The patient was continuously monitored during the procedure by the interventional radiology nurse under my direct supervision. COMPLICATIONS: None immediate. PROCEDURE: Informed consent was obtained from the patient following an explanation of the procedure, risks, benefits and alternatives. The patient understands, agrees and consents for the procedure. All questions were addressed. A time out was performed prior to the initiation of the procedure. The patient was positioned prone and non-contrast localization CT was performed of the pelvis to demonstrate the iliac marrow spaces. The operative site was prepped and draped in the usual sterile fashion. Under sterile conditions and local anesthesia, a 22 gauge spinal needle was utilized for procedural planning. Next, an 11 gauge coaxial bone biopsy needle was advanced into the left iliac marrow space. Needle position was confirmed with CT imaging. Initially, bone marrow aspiration was performed. Next, a bone marrow biopsy was obtained with the 11 gauge outer bone marrow device. Samples were prepared with the cytotechnologist and deemed adequate. The needle was removed intact. Hemostasis was obtained with compression and a dressing was placed. The patient tolerated the procedure well without immediate post procedural complication. IMPRESSION: Successful CT guided left iliac bone marrow aspiration and core biopsy. Electronically Signed   By: Sandi Mariscal M.D.   On: 08/05/2019 12:00   Dg Bone Survey Met  Result Date: 08/02/2019 CLINICAL DATA:  Monoclonal paraproteinemia. EXAM: METASTATIC BONE SURVEY COMPARISON:  None. FINDINGS: Degenerative changes are seen involving the left knee. No lytic or sclerotic lesions are noted in the skull, spine, rib cage, pelvis  or extremities. IMPRESSION: No lytic or sclerotic lesions are noted in the skeleton. Electronically Signed   By: Marijo Conception M.D.   On: 08/02/2019 16:32   Ct Bone Marrow Biopsy & Aspiration  Result Date: 08/05/2019 INDICATION: Monoclonal paraproteinemia. Please perform CT-guided bone marrow biopsy for tissue diagnostic purposes. EXAM: CT-GUIDED BONE MARROW BIOPSY AND ASPIRATION MEDICATIONS: None ANESTHESIA/SEDATION: Fentanyl 150 mcg IV; Versed 3 mg IV Sedation Time: 11 Minutes; The  patient was continuously monitored during the procedure by the interventional radiology nurse under my direct supervision. COMPLICATIONS: None immediate. PROCEDURE: Informed consent was obtained from the patient following an explanation of the procedure, risks, benefits and alternatives. The patient understands, agrees and consents for the procedure. All questions were addressed. A time out was performed prior to the initiation of the procedure. The patient was positioned prone and non-contrast localization CT was performed of the pelvis to demonstrate the iliac marrow spaces. The operative site was prepped and draped in the usual sterile fashion. Under sterile conditions and local anesthesia, a 22 gauge spinal needle was utilized for procedural planning. Next, an 11 gauge coaxial bone biopsy needle was advanced into the left iliac marrow space. Needle position was confirmed with CT imaging. Initially, bone marrow aspiration was performed. Next, a bone marrow biopsy was obtained with the 11 gauge outer bone marrow device. Samples were prepared with the cytotechnologist and deemed adequate. The needle was removed intact. Hemostasis was obtained with compression and a dressing was placed. The patient tolerated the procedure well without immediate post procedural complication. IMPRESSION: Successful CT guided left iliac bone marrow aspiration and core biopsy. Electronically Signed   By: Sandi Mariscal M.D.   On: 08/05/2019 12:00      ASSESSMENT & PLAN:  Dustin Sampson is a 53 y.o. male with:  1. 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 Proteinuria ? Related to HTN, DM2 vs AL Amyloidosis or other plasma cell dyscrasia  PLAN:  -08/05/2019 full body bone survey showed no bone tumors. -08/05/2019 blood test showed his monoclonal  IgG kappa M protein spike hasn't changed since 05/2019.  -no CRAB criteria to suggest myeloma - Bone marrow biopsy showed at 4% monoclonal plasma cells which suggests MGUS.  -Discussed no overt evidence of Amyloid protein deposit in the BM  -Recommend following up with kidney doctor to determine if a kidney biopsy is needed to r/o AL Amyloidosis and other etiologies for proteinuria outside of HTN and DM2 -Kappa and light chains are normal suggesting that  AL  amyloidosis is less likely. -Follow up in 6 months to determine if there is any change in monoclonal paraproteinemia     FOLLOW UP: RTC with Dr Irene Limbo with labs In 6 months   The total time spent in the appt was 25 minutes and more than 50% was on counseling and direct patient cares.  All of the patient's questions were answered with apparent satisfaction. The patient knows to call the clinic with any problems, questions or concerns.     Sullivan Lone MD MS AAHIVMS [facility] Hematology/Oncology Physician [facility]  (Office):       [phone] (Work cell):  [phone] (Fax):           [phone]  08/14/2019 9:08 PM  I, Scot Dock, am acting as a scribe for Dr. Sullivan Lone.   .I have reviewed the above documentation for accuracy and completeness, and I agree with the above. Brunetta Genera MD

## 2019-08-14 NOTE — Telephone Encounter (Signed)
Contacted patient to verify telephone visit for pre reg °

## 2019-08-15 ENCOUNTER — Telehealth: Payer: Self-pay | Admitting: Hematology

## 2019-08-15 ENCOUNTER — Inpatient Hospital Stay (HOSPITAL_BASED_OUTPATIENT_CLINIC_OR_DEPARTMENT_OTHER): Payer: 59 | Admitting: Hematology

## 2019-08-15 DIAGNOSIS — R809 Proteinuria, unspecified: Secondary | ICD-10-CM | POA: Diagnosis not present

## 2019-08-15 DIAGNOSIS — D472 Monoclonal gammopathy: Secondary | ICD-10-CM | POA: Diagnosis not present

## 2019-08-15 NOTE — Telephone Encounter (Signed)
Scheduled appt per 10/22 los.  Spoke with pt and he is aware of his appt date and time.

## 2019-09-06 NOTE — Progress Notes (Signed)
Virtual Visit via Video Note The purpose of this virtual visit is to provide medical care while limiting exposure to the novel coronavirus.    Consent was obtained for video visit:  Yes.   Answered questions that patient had about telehealth interaction:  Yes.   I discussed the limitations, risks, security and privacy concerns of performing an evaluation and management service by telemedicine. I also discussed with the patient that there may be a patient responsible charge related to this service. The patient expressed understanding and agreed to proceed.  Pt location: Home Physician Location: office Name of referring provider:  [name], PA-C I connected with Dustin Sampson at patients initiation/request on 09/09/2019 at  2:50 PM EST by video enabled telemedicine application and verified that I am speaking with the correct person using two identifiers. Pt MRN:  [id number] Pt DOB:  1966-04-25 Video Participants:  Dustin Sampson   History of Present Illness:  Dustin Sampson is a 53 year old male with hypertension, hyperlipidemia, monoclonal paraproteinemia, and hypothyroidism who follows up for headaches.  He has had chronic headaches all of his life.  He reports history of migraines, tension type headache and "sinus" headaches.  He reports a constant moderate pressure headache (sometimes throbbing) located above either eye (right more than left) and back of the head.  He has associated neck tightness.  It is constant.  On only rare occasions is there nausea.  Otherwise, no nausea, vomiting, photophobia, phonophobia or visual disturbance.  Alcohol is a trigger. Nothing really relieves it except for pain relievers.    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  I saw him once in October 2018.  At that time, he reported taking Fioricet twice daily (for 20 years), Advil daily (to treat  headache as well as joint pain), drank caffeine every morning and at takes would take Excedrin as well.  He also would use an intranasal decongestant.  At that time, I had started him on nortriptyline and given him instructions on how to taper off of Fioricet.  He never followed up.  He has a history of poor sleep and takes Restoril.  He does not feel fatigued during the day.  His wife says he does not snore much.  He is a cigarette smoker.  He does not drink alcohol.  He does not exercise routinely.  He says he hydrates with water during the day.    His mother had headaches.  Current NSAIDS:  Contraindicated (kidney problems) Current analgesics:  Fioricet (daily) Current triptans:  none Current ergotamine:  none Current anti-emetic:  none Current muscle relaxants:  none Current anti-anxiolytic:  none Current sleep aide:  Restoril Current Antihypertensive medications:  Losartan 50mg  Current Antidepressant medications:  none Current Anticonvulsant medications:  Gabapentin 300mg  three times daily (for neuropathy) Current anti-CGRP:  none Current Vitamins/Herbal/Supplements:  none Current Antihistamines/Decongestants:  Sudafed Other therapy:  none  Past NSAIDS:  Advil Past analgesics:  Excedrin Past abortive triptans:  none Past abortive ergotamine:  none Past muscle relaxants:  none Past anti-emetic:  none Past antihypertensive medications:  none Past antidepressant medications:  Nortriptyline 25mg  (did not do well with it) Past anticonvulsant medications:  none Past anti-CGRP:  none Past vitamins/Herbal/Supplements:  none Other past therapies:  none  Caffeine:  1 to 3-4 cups of coffee daily. Exercise:  no Depression:  no; Anxiety:  Yes. Other pain:  gout Sleep hygiene:  varies  Past Medical History: Past Medical History:  Diagnosis Date  . Diabetes mellitus without complication ([place])   . Hyperlipemia   . Hypertension   . Insomnia   . Thyroid disease      Medications: Outpatient Encounter Medications as of 09/09/2019  Medication Sig Note  . atorvastatin (LIPITOR) 10 MG tablet Take 10 mg by mouth daily.   . fluorouracil (EFUDEX) 5 % cream Apply topically 2 (two) times daily.   Marland Kitchen gabapentin (NEURONTIN) 300 MG capsule TAKE 1 CAPSULE BY MOUTH 3 TIMES A DAY AS NEEDED FOR NEUROPATHY   . levothyroxine (SYNTHROID, LEVOTHROID) 200 MCG tablet Take 200 mcg by mouth daily before breakfast.   . losartan (COZAAR) 50 MG tablet Take 50 mg by mouth daily.   . methylPREDNISolone (MEDROL DOSEPAK) 4 MG TBPK tablet follow package directions 08/05/2019: No longer taking  . pseudoephedrine (SUDAFED) 60 MG tablet Take 120 mg by mouth every 4 (four) hours as needed for congestion (q 12 hr/ extended release tab).   . temazepam (RESTORIL) 30 MG capsule Take 30 mg by mouth at bedtime as needed for sleep.    No facility-administered encounter medications on file as of 09/09/2019.     Allergies: Allergies  Allergen Reactions  . 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 Avelox [Moxifloxacin Hcl In Nacl] Nausea And Vomiting    "made me real sick"  . Penicillins     REACTION: severe GI upset    Family History: Family History  Problem Relation Age of Onset  . Colon cancer Father     Social History: Social History   Socioeconomic History  . Marital status: Married    Spouse name: Not on file  . Number of children: 2  . Years of education: BS  . Highest education level: Not on file  Occupational History  . Occupation: Arts development officer  Social Needs  . Financial resource strain: Not on file  . Food insecurity    Worry: Not on file    Inability: Not on file  . Transportation needs    Medical: Not on file    Non-medical: Not on file  Tobacco Use  . Smoking status: Current Every Day Smoker    Packs/day: 0.50    Types: Cigarettes  . Smokeless tobacco: Former Network engineer and Sexual Activity  . Alcohol use: No  . Drug  use: No  . Sexual activity: Not on file  Lifestyle  . Physical activity    Days per week: Not on file    Minutes per session: Not on file  . Stress: Not on file  Relationships  . Social Herbalist on phone: Not on file    Gets together: Not on file    Attends religious service: Not on file    Active member of club or organization: Not on file    Attends meetings of clubs or organizations: Not on file    Relationship status: Not on file  . Intimate partner violence    Fear of current or ex partner: Not on file    Emotionally abused: Not on file    Physically abused: Not on file    Forced sexual activity: Not on file  Other Topics Concern  . Not on file  Social History Narrative   Married, lives with wife and two teenage children. He and his sister alternate with their parents living with them every other month. His parents are in their  upper 80's. He drinks 4-5 cups of coffee a day and 2-3 sodas or glasses of tea. He does not get any regular exercise.    Observations/Objective:   Height 6\' 1"  (1.854 m), weight 225 lb (102.1 kg). No acute distress.  Alert and oriented.  Speech fluent and not dysarthric.  Language intact.  Eyes orthophoric on primary gaze.  Face symmetric.  Assessment and Plan:   Chronic tension-type headache, complicated by medication overuse.  1.  For preventative management, topiramate 25mg  at bedtime for one week, then increase to 50mg  at bedtime.  We can increase to 75mg  at bedtime in 5 weeks if needed 2.  For abortive therapy, tramadol 3.  Wean off of Fioricet 4.  Limit use of pain relievers to no more than 2 days out of week to prevent risk of rebound or medication-overuse headache. 5.  Keep headache diary 6.  Exercise, hydration, caffeine cessation, sleep hygiene, monitor for and avoid triggers 7.  Consider:  magnesium citrate 400mg  daily, riboflavin 400mg  daily, and coenzyme Q10 100mg  three times daily 8. Follow up 4 months.   Follow Up  Instructions:    -I discussed the assessment and treatment plan with the patient. The patient was provided an opportunity to ask questions and all were answered. The patient agreed with the plan and demonstrated an understanding of the instructions.   The patient was advised to call back or seek an in-person evaluation if the symptoms worsen or if the condition fails to improve as anticipated.    Dudley Major, DO

## 2019-09-09 ENCOUNTER — Telehealth (INDEPENDENT_AMBULATORY_CARE_PROVIDER_SITE_OTHER): Payer: 59 | Admitting: Neurology

## 2019-09-09 ENCOUNTER — Other Ambulatory Visit: Payer: Self-pay

## 2019-09-09 ENCOUNTER — Encounter: Payer: Self-pay | Admitting: Neurology

## 2019-09-09 VITALS — Ht 73.0 in | Wt 225.0 lb

## 2019-09-09 DIAGNOSIS — G44229 Chronic tension-type headache, not intractable: Secondary | ICD-10-CM | POA: Diagnosis not present

## 2019-09-09 DIAGNOSIS — G4440 Drug-induced headache, not elsewhere classified, not intractable: Secondary | ICD-10-CM

## 2019-09-09 MED ORDER — TRAMADOL HCL 50 MG PO TABS: 50.0000 mg | ORAL_TABLET | Freq: Four times a day (QID) | ORAL | 1 refills | Status: DC | PRN

## 2019-09-09 MED ORDER — TOPIRAMATE 25 MG PO TABS: ORAL_TABLET | ORAL | 0 refills | Status: DC

## 2019-09-09 NOTE — Patient Instructions (Signed)
1.  For preventative management, topiramate 25mg  at bedtime for one week, then increase to 50mg  at bedtime.  We can increase to 75mg  at bedtime in 5 weeks if needed 2.  For abortive therapy, tramadol.  Limit use to no more than 2 days out of week to prevent risk of rebound or medication-overuse headache. 3.  Wean off of Fioricet:  Take no more than 5 days this week, then no more than 4 days on week 2, then no more than 3 days on week 3, then no more th an 2 days on week 4, then no more than 1 day on week 5, then stop. 4.  Keep headache diary 5.  Exercise, hydration, caffeine cessation, sleep hygiene, monitor for and avoid triggers 6.  Follow up 4 months.

## 2019-09-10 ENCOUNTER — Encounter (HOSPITAL_COMMUNITY): Payer: Self-pay

## 2019-09-10 ENCOUNTER — Other Ambulatory Visit (HOSPITAL_COMMUNITY): Payer: Self-pay | Admitting: Nephrology

## 2019-09-10 DIAGNOSIS — K118 Other diseases of salivary glands: Secondary | ICD-10-CM

## 2019-09-10 DIAGNOSIS — D472 Monoclonal gammopathy: Secondary | ICD-10-CM

## 2019-09-10 NOTE — Progress Notes (Signed)
Dustin Sampson Male, 53 y.o., [birth year] MRN:  [id number] Phone:  [phone] Dustin Sampson) PCP:  Dustin Stalker, PA-C Primary [facility]vg:  [facility] Other Next Appt With Radiology ([facility] 2) 09/23/2019 at 8:00 AM  RE: Biopsy Received: Today Message Contents  Sandi Mariscal, MD  Dustin Sampson  US guided random renal Bx.   D/W Dr. Hollie Salk (parotid mass in "reason for procedure" is incorrect)   Thx,  Ulice Dash   Previous Messages  ----- Message -----  From: Lenore Cordia  Sent: 09/10/2019  4:09 PM EST  To: Ir Procedure Requests  Subject: Biopsy                      Procedure Requested: Biopsy, Kidney. Percutaneous    Reason for Procedure: MGUS , Mass of parotid Gland    Provider Requesting: Madelon Lips, MD  Provider Telephone: [phone]    Other Info: Rad Exam and Bx's in Epic

## 2019-09-20 ENCOUNTER — Other Ambulatory Visit: Payer: Self-pay | Admitting: Physician Assistant

## 2019-09-23 ENCOUNTER — Other Ambulatory Visit: Payer: Self-pay

## 2019-09-23 ENCOUNTER — Ambulatory Visit (HOSPITAL_COMMUNITY)
Admission: RE | Admit: 2019-09-23 | Discharge: 2019-09-23 | Disposition: A | Discharge status: Home / Self Care | Payer: 59 | Source: Ambulatory Visit | Attending: Nephrology | Admitting: Nephrology

## 2019-09-23 ENCOUNTER — Encounter (HOSPITAL_COMMUNITY): Payer: Self-pay

## 2019-09-23 DIAGNOSIS — E785 Hyperlipidemia, unspecified: Secondary | ICD-10-CM | POA: Diagnosis not present

## 2019-09-23 DIAGNOSIS — K118 Other diseases of salivary glands: Secondary | ICD-10-CM

## 2019-09-23 DIAGNOSIS — Z801 Family history of malignant neoplasm of trachea, bronchus and lung: Secondary | ICD-10-CM | POA: Diagnosis not present

## 2019-09-23 DIAGNOSIS — Z881 Allergy status to other antibiotic agents status: Secondary | ICD-10-CM | POA: Diagnosis not present

## 2019-09-23 DIAGNOSIS — E079 Disorder of thyroid, unspecified: Secondary | ICD-10-CM | POA: Diagnosis not present

## 2019-09-23 DIAGNOSIS — D472 Monoclonal gammopathy: Secondary | ICD-10-CM | POA: Insufficient documentation

## 2019-09-23 DIAGNOSIS — G629 Polyneuropathy, unspecified: Secondary | ICD-10-CM | POA: Insufficient documentation

## 2019-09-23 DIAGNOSIS — Z8249 Family history of ischemic heart disease and other diseases of the circulatory system: Secondary | ICD-10-CM | POA: Insufficient documentation

## 2019-09-23 DIAGNOSIS — Z886 Allergy status to analgesic agent status: Secondary | ICD-10-CM | POA: Diagnosis not present

## 2019-09-23 DIAGNOSIS — F17210 Nicotine dependence, cigarettes, uncomplicated: Secondary | ICD-10-CM | POA: Insufficient documentation

## 2019-09-23 DIAGNOSIS — Z880 Allergy status to penicillin: Secondary | ICD-10-CM | POA: Diagnosis not present

## 2019-09-23 DIAGNOSIS — Z79899 Other long term (current) drug therapy: Secondary | ICD-10-CM | POA: Insufficient documentation

## 2019-09-23 DIAGNOSIS — R809 Proteinuria, unspecified: Secondary | ICD-10-CM | POA: Insufficient documentation

## 2019-09-23 DIAGNOSIS — Z79890 Hormone replacement therapy (postmenopausal): Secondary | ICD-10-CM | POA: Diagnosis not present

## 2019-09-23 DIAGNOSIS — E119 Type 2 diabetes mellitus without complications: Secondary | ICD-10-CM | POA: Diagnosis not present

## 2019-09-23 DIAGNOSIS — Z800 Family history of malignant neoplasm of digestive organs: Secondary | ICD-10-CM | POA: Diagnosis not present

## 2019-09-23 DIAGNOSIS — G4700 Insomnia, unspecified: Secondary | ICD-10-CM | POA: Insufficient documentation

## 2019-09-23 DIAGNOSIS — I10 Essential (primary) hypertension: Secondary | ICD-10-CM | POA: Insufficient documentation

## 2019-09-23 DIAGNOSIS — Z7984 Long term (current) use of oral hypoglycemic drugs: Secondary | ICD-10-CM | POA: Insufficient documentation

## 2019-09-23 LAB — BASIC METABOLIC PANEL
Anion gap: 10 (ref 5–15)
BUN: 12 mg/dL (ref 6–20)
CO2: 23 mmol/L (ref 22–32)
Calcium: 9.2 mg/dL (ref 8.9–10.3)
Chloride: 104 mmol/L (ref 98–111)
Creatinine, Ser: 0.78 mg/dL (ref 0.61–1.24)
GFR calc Af Amer: >60 mL/min (ref >60)
GFR calc non Af Amer: >60 mL/min (ref >60)
Glucose, Bld: 144 mg/dL — ABNORMAL HIGH (ref 70–99)
Potassium: 4 mmol/L (ref 3.5–5.1)
Sodium: 137 mmol/L (ref 135–145)

## 2019-09-23 LAB — GLUCOSE, CAPILLARY: Glucose-Capillary: 141 mg/dL — ABNORMAL HIGH (ref 70–99)

## 2019-09-23 LAB — CBC
HCT: 40.2 % (ref 39.0–52.0)
Hemoglobin: 13.6 g/dL (ref 13.0–17.0)
MCH: 29.1 pg (ref 26.0–34.0)
MCHC: 33.8 g/dL (ref 30.0–36.0)
MCV: 85.9 fL (ref 80.0–100.0)
Platelets: 281 10*3/uL (ref 150–400)
RBC: 4.68 MIL/uL (ref 4.22–5.81)
RDW: 12.9 % (ref 11.5–15.5)
WBC: 11.1 10*3/uL — ABNORMAL HIGH (ref 4.0–10.5)
nRBC: 0 % (ref 0.0–0.2)

## 2019-09-23 LAB — PROTIME-INR
INR: 0.9 (ref 0.8–1.2)
Prothrombin Time: 11.9 seconds (ref 11.4–15.2)

## 2019-09-23 MED ORDER — SODIUM CHLORIDE 0.9 % IV SOLN
INTRAVENOUS | Status: AC | PRN
  Administered 2019-09-23: 10 mL/h via INTRAVENOUS

## 2019-09-23 MED ORDER — FENTANYL CITRATE (PF) 100 MCG/2ML IJ SOLN
INTRAMUSCULAR | Status: AC
  Filled 2019-09-23: qty 2

## 2019-09-23 MED ORDER — LIDOCAINE HCL (PF) 1 % IJ SOLN
INTRAMUSCULAR | Status: AC
  Filled 2019-09-23: qty 30

## 2019-09-23 MED ORDER — FENTANYL CITRATE (PF) 100 MCG/2ML IJ SOLN
INTRAMUSCULAR | Status: AC | PRN
  Administered 2019-09-23: 50 ug via INTRAVENOUS
  Administered 2019-09-23: 25 ug via INTRAVENOUS

## 2019-09-23 MED ORDER — HYDROCODONE-ACETAMINOPHEN 5-325 MG PO TABS
1.0000 | ORAL_TABLET | ORAL | Status: DC | PRN
  Administered 2019-09-23: 1 via ORAL
  Filled 2019-09-23: qty 1

## 2019-09-23 MED ORDER — MIDAZOLAM HCL 2 MG/2ML IJ SOLN
INTRAMUSCULAR | Status: AC
  Filled 2019-09-23: qty 2

## 2019-09-23 MED ORDER — MIDAZOLAM HCL 2 MG/2ML IJ SOLN
INTRAMUSCULAR | Status: AC | PRN
  Administered 2019-09-23: 0.5 mg via INTRAVENOUS
  Administered 2019-09-23: 1 mg via INTRAVENOUS

## 2019-09-23 MED ORDER — SODIUM CHLORIDE 0.9 % IV SOLN: INTRAVENOUS | Status: DC

## 2019-09-23 NOTE — H&P (Signed)
Chief Complaint: Patient was seen in consultation today for a random renal biopsy.  Referring Physician(s): Upton,Elizabeth  Supervising Physician: Arne Cleveland  Patient Status: [facility] - Out-pt  History of Present Illness: Dustin Sampson is a 53 y.o. male with a past medical history significant for HTN, HLD, DM and Warthin's tumor who presents today for a random renal biopsy. 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 Pathology of the bone marrow biopsy showed normocellular bone marrow and a small population of monoclonal plasma cells. He was recently seen by nephrology for evaluation of proteinuria and a request has been made to IR for a random renal biopsy to further direct management.  Dustin Sampson denies any complaints today, he denies any dysuria, hematuria or flank pain. He states that he is "ready to get going." He states understanding of the requested procedure and wishes to proceed.   Past Medical History:  Diagnosis Date  . Diabetes mellitus without complication ([place])   . Hyperlipemia   . Hypertension   . Insomnia   . Thyroid disease     Past Surgical History:  Procedure Laterality Date  . COLONOSCOPY     x2  . NASAL SINUS SURGERY    . WISDOM TOOTH EXTRACTION    . WRIST SURGERY     bilat    Allergies: Aspirin, Avelox [moxifloxacin hcl in nacl], and Penicillins  Medications: Prior to Admission medications   Medication Sig Start Date End Date Taking? Authorizing Provider  atorvastatin (LIPITOR) 10 MG tablet Take 10 mg by mouth daily.   Yes [provider]  butalbital-acetaminophen-caffeine (FIORICET) 50-325-40 MG tablet butalbital-acetaminophen-caffeine 50 mg-325 mg-40 mg tablet  TAKE 1 TABLET BY MOUTH EVERY 4 HOURS AS NEEDED 15   Yes [provider]  gabapentin (NEURONTIN) 300 MG capsule  TAKE 1 CAPSULE BY MOUTH 3 TIMES A DAY AS NEEDED FOR NEUROPATHY 02/26/19  Yes [provider]  levothyroxine (SYNTHROID, LEVOTHROID) 200 MCG tablet Take 200 mcg by mouth daily before breakfast.   Yes [provider]  losartan (COZAAR) 50 MG tablet Take 50 mg by mouth daily.   Yes [provider]  metFORMIN (GLUCOPHAGE) 500 MG tablet Take by mouth 2 (two) times daily with a meal.   Yes [provider]  pseudoephedrine (SUDAFED) 60 MG tablet Take 120 mg by mouth every 4 (four) hours as needed for congestion (q 12 hr/ extended release tab).   Yes [provider]  tadalafil (CIALIS) 20 MG tablet tadalafil 20 mg tablet  TAKE 1 TABLET BY MOUTH AS NEEDED PRIOR TO SEXUAL ACTIVITY   Yes [provider]  temazepam (RESTORIL) 30 MG capsule Take 30 mg by mouth at bedtime as needed for sleep.   Yes [provider]  topiramate (TOPAMAX) 25 MG tablet Take 1 tablet at bedtime for a week, then increase to 2 tablets at bedtime. 09/09/19  Yes Jaffe, Adam R, DO  traMADol (ULTRAM) 50 MG tablet Take 1 tablet (50 mg total) by mouth every 6 (six) hours as needed. 09/09/19  Yes Jaffe, Adam R, DO  fluorouracil (EFUDEX) 5 % cream Apply topically 2 (two) times daily. 05/27/19   Wallene Huh, DPM     Family History  Problem Relation Age of Onset  . Colon cancer Father   . Lung cancer Father   . Atrial fibrillation  Mother   . Autoimmune disease Sister   . Healthy Daughter   . Healthy Son     Social History   Socioeconomic History  . Marital status: Married    Spouse name: Not on file  . Number of children: 2  . Years of education: BS  . Highest education level: Bachelor's degree (e.g., BA, AB, BS)  Occupational History  . Occupation: Arts development officer  Social Needs  . Financial resource strain: Not on file  . Food insecurity    Worry: Not on file    Inability: Not on file  . Transportation needs    Medical: Not on file    Non-medical: Not on file   Tobacco Use  . Smoking status: Current Every Day Smoker    Packs/day: 0.50    Types: Cigarettes  . Smokeless tobacco: Former Systems developer  . Tobacco comment: 1/2 PACK A DAY  Substance and Sexual Activity  . Alcohol use: Yes    Comment: social  . Drug use: No  . Sexual activity: Not on file  Lifestyle  . Physical activity    Days per week: Not on file    Minutes per session: Not on file  . Stress: Not on file  Relationships  . Social Herbalist on phone: Not on file    Gets together: Not on file    Attends religious service: Not on file    Active member of club or organization: Not on file    Attends meetings of clubs or organizations: Not on file    Relationship status: Not on file  Other Topics Concern  . Not on file  Social History Narrative   Married, lives with wife and two teenage children. He and his sister alternate with their parents living with them every other month. His parents are in their upper 51's. He drinks 4-5 cups of coffee a day and 2-3 sodas or glasses of tea. He does not get any regular exercise.     Review of Systems: A 12 point ROS discussed and pertinent positives are indicated in the HPI above.  All other systems are negative.  Review of Systems  Constitutional: Negative for appetite change, chills, fatigue and fever.  HENT: Negative for nosebleeds.   Respiratory: Negative for cough and shortness of breath.   Cardiovascular: Negative for chest pain.  Gastrointestinal: Negative for abdominal pain, blood in stool, diarrhea, nausea and vomiting.  Genitourinary: Negative for dysuria, flank pain and hematuria.  Musculoskeletal: Negative for back pain.  Skin: Negative for rash.  Neurological: Negative for dizziness and headaches.    Vital Signs: BP 127/79   Pulse 87   Temp 97.9 F (36.6 C) (Oral)   Resp 16   Ht '6\' 1"'  (1.854 m)   Wt 225 lb (102.1 kg)   SpO2 97%   BMI 29.69 kg/m   Physical Exam Vitals signs reviewed.  Constitutional:       General: He is not in acute distress. HENT:     Mouth/Throat:     Mouth: Mucous membranes are moist.     Pharynx: Oropharynx is clear. No oropharyngeal exudate or posterior oropharyngeal erythema.  Cardiovascular:     Rate and Rhythm: Normal rate and regular rhythm.  Pulmonary:     Effort: Pulmonary effort is normal.     Breath sounds: Normal breath sounds.  Abdominal:     General: Bowel sounds are normal. There is no distension.     Palpations: Abdomen is soft.  Tenderness: There is no abdominal tenderness.  Skin:    General: Skin is warm and dry.  Neurological:     Mental Status: He is alert and oriented to person, place, and time.  Psychiatric:        Mood and Affect: Mood normal.        Behavior: Behavior normal.        Thought Content: Thought content normal.        Judgment: Judgment normal.      MD Evaluation Airway: WNL Heart: WNL Abdomen: WNL Chest/ Lungs: WNL ASA  Classification: 2 Mallampati/Airway Score: One   Imaging: No results found.  Labs:  CBC: Recent Labs    06/20/19 1108 07/25/19 1438 08/05/19 1005 09/23/19 0600  WBC 10.5 11.6* 9.9 11.1*  HGB 13.4 13.9 13.8 13.6  HCT 40.4 42.0 41.6 40.2  PLT 255 245 267 281    COAGS: Recent Labs    06/20/19 1108 08/05/19 1005 09/23/19 0600  INR 0.9 0.8 0.9    BMP: Recent Labs    07/25/19 1438 09/23/19 0600  NA 140 137  K 4.3 4.0  CL 103 104  CO2 25 23  GLUCOSE 99 144*  BUN 13 12  CALCIUM 9.5 9.2  CREATININE 0.91 0.78  GFRNONAA >60 >60  GFRAA >60 >60    LIVER FUNCTION TESTS: Recent Labs    07/25/19 1438  BILITOT 0.2*  AST 19  ALT 38  ALKPHOS 106  PROT 7.6  ALBUMIN 4.1    TUMOR MARKERS: No results for input(s): AFPTM, CEA, CA199, CHROMGRNA in the last 8760 hours.  Assessment and Plan:  53 y/o M with history of positive M spike/monoclonal paraproteinemia and proteinuria followed by nephrology who presents today for a random renal biopsy to further evaluate the  proteinuria.  Patient has been NPO since midnight, he does not take any blood thinning/antiplatelet medications. Afebrile, WBC 11.1, hgb 13.6, plt 281, creatinine 0.78, INR 0.9.  Risks and benefits of random renal biopsy was discussed with the patient and/or patient's family including, but not limited to bleeding, infection, damage to adjacent structures or low yield requiring additional tests.  All of the questions were answered and there is agreement to proceed.  Consent signed and in chart.   Thank you for this interesting consult.  I greatly enjoyed meeting Dustin Sampson and look forward to participating in their care.  A copy of this report was sent to the requesting provider on this date.  Electronically Signed: Joaquim Nam, PA-C 09/23/2019, 7:28 AM   I spent a total of  15 Minutes in face to face in clinical consultation, greater than 50% of which was counseling/coordinating care for random renal biopsy.

## 2019-09-23 NOTE — Discharge Instructions (Signed)

## 2019-09-23 NOTE — Procedures (Signed)
  Procedure: [name] core bx RLP kidney 16g x2 EBL:   minimal Complications:  none immediate  See full dictation in [facility].  Dillard Cannon MD Main # [phone] Pager  [phone]

## 2019-10-02 LAB — SURGICAL PATHOLOGY

## 2019-10-05 ENCOUNTER — Other Ambulatory Visit: Payer: Self-pay | Admitting: Neurology

## 2019-12-03 ENCOUNTER — Encounter (HOSPITAL_COMMUNITY): Payer: Self-pay

## 2020-02-03 ENCOUNTER — Other Ambulatory Visit: Payer: Self-pay

## 2020-02-03 ENCOUNTER — Telehealth (INDEPENDENT_AMBULATORY_CARE_PROVIDER_SITE_OTHER): Payer: 59 | Admitting: Neurology

## 2020-02-03 ENCOUNTER — Encounter: Payer: Self-pay | Admitting: Neurology

## 2020-02-03 DIAGNOSIS — R2689 Other abnormalities of gait and mobility: Secondary | ICD-10-CM

## 2020-02-03 DIAGNOSIS — H532 Diplopia: Secondary | ICD-10-CM | POA: Diagnosis not present

## 2020-02-03 DIAGNOSIS — G44219 Episodic tension-type headache, not intractable: Secondary | ICD-10-CM

## 2020-02-03 MED ORDER — TRAMADOL HCL 50 MG PO TABS: 50.0000 mg | ORAL_TABLET | Freq: Four times a day (QID) | ORAL | 1 refills | Status: DC | PRN

## 2020-02-03 MED ORDER — TOPIRAMATE 100 MG PO TABS: 100.0000 mg | ORAL_TABLET | Freq: Every day | ORAL | 3 refills | Status: DC

## 2020-02-03 NOTE — Addendum Note (Signed)
Addended by: Venetia Night on: 02/03/2020 04:36 PM   Modules accepted: Orders

## 2020-02-03 NOTE — Progress Notes (Signed)
Virtual Visit via Video Note The purpose of this virtual visit is to provide medical care while limiting exposure to the novel coronavirus.    Consent was obtained for video visit:  Yes.   Answered questions that patient had about telehealth interaction:  Yes.   I discussed the limitations, risks, security and privacy concerns of performing an evaluation and management service by telemedicine. I also discussed with the patient that there may be a patient responsible charge related to this service. The patient expressed understanding and agreed to proceed.  Pt location: Home Physician Location: office Name of referring provider:  [name], PA-C I connected with Lamonte Richer at patients initiation/request on 02/03/2020 at  3:30 PM EDT by video enabled telemedicine application and verified that I am speaking with the correct person using two identifiers. Pt MRN:  [id number] Pt DOB:  17-Mar-1966 Video Participants:  Lamonte Richer   History of Present Illness:  Dustin Sampson is a 54 year old male with hypertension, hyperlipidemia, monoclonal paraproteinemia, and hypothyroidism who follows up for tension-type headaches.  UPDATE: Started topiramate in November.  Tramadol for rescue therapy.  He was instructed to wean off of Fioricet.  Intensity:  moderate Duration:  30-45 minutes Frequency:  2 to 3 a week  Of note, he mentions balance problems for 3 years but has gotten worse.  It is not associated with dizziness.  He feels wobbly when he is walking.  It is intermittent.  He also reports that he sometimes has double vision, such as while driving or watching TV and he needs to shut one eye.  He went to the eye doctor and thought that he may have had astigmatism.  He reports droopy eyelids and is concerned about myasthenia gravis (doctors thought his father may have had MG, but that was ruled out).  He does have hypothyroidism which he reports is currently not well-treated on Synthroid and is  supposed to see an endocrinologist.  He has diabetes but does not know his Hgb A1c.  He had prediabetes for years which progressed to diabetes about 3 years ago.  Frequency of abortive medication: 2 to 3 days a week Current NSAIDS:  Contraindicated (kidney problems) Current analgesics:  2 tramadol Current triptans:  none Current ergotamine:  none Current anti-emetic:  none Current muscle relaxants:  none Current anti-anxiolytic:  none Current sleep aide:  Restoril Current Antihypertensive medications:  Losartan 50mg  Current Antidepressant medications:  none Current Anticonvulsant medications:  Topiramate 50mg  at bedtime; gabapentin 300mg  three times daily (for neuropathy) Current anti-CGRP:  none Current Vitamins/Herbal/Supplements:  none Current Antihistamines/Decongestants:  Sudafed Other therapy:  none  Caffeine:  1 to 3 cups of coffee daily. Exercise:  no Depression:  no; Anxiety:  Yes. Other pain:  gout Sleep hygiene:  varies  HISTORY: He has had chronic headaches all of his life. He reports history of migraines, tension type headache and "sinus" headaches. He reports a constant moderate pressure headache (sometimes throbbing) located above either eye (right more than left) and back of the head. He has associated neck tightness. It is constant. On only rare occasions is there nausea. Otherwise, no nausea, vomiting, photophobia, phonophobia or visual disturbance. Alcohol is a trigger. Nothing really relieves it except for pain relievers.   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  I saw him in October 2018.  At that time, he reported taking Fioricet twice daily (for 20 years),  Advil daily (to treat headache as well as joint pain), drank caffeine every morning and at takes would take Excedrin as well. He also would use an intranasal decongestant. At that  time, I had started him on nortriptyline and given him instructions on how to taper off of Fioricet.  He never followed up.  He has a history of poor sleep and takes Restoril. He does not feel fatigued during the day. His wife says he does not snore much. He is a cigarette smoker. He does not drink alcohol. He does not exercise routinely. He says he hydrates with water during the day.   His mother had headaches.   Past NSAIDS:  Advil Past analgesics:  Excedrin; Fioricet Past abortive triptans:  none Past abortive ergotamine:  none Past muscle relaxants:  none Past anti-emetic:  none Past antihypertensive medications:  none Past antidepressant medications:  Nortriptyline 25mg  (did not do well with it) Past anticonvulsant medications:  none Past anti-CGRP:  none Past vitamins/Herbal/Supplements:  none Other past therapies:  none    Past Medical History: Past Medical History:  Diagnosis Date  . Diabetes mellitus without complication ([place])   . Hyperlipemia   . Hypertension   . Insomnia   . Thyroid disease     Medications: Outpatient Encounter Medications as of 02/03/2020  Medication Sig  . atorvastatin (LIPITOR) 10 MG tablet Take 10 mg by mouth daily.  . butalbital-acetaminophen-caffeine (FIORICET) 50-325-40 MG tablet butalbital-acetaminophen-caffeine 50 mg-325 mg-40 mg tablet  TAKE 1 TABLET BY MOUTH EVERY 4 HOURS AS NEEDED 15  . fluorouracil (EFUDEX) 5 % cream Apply topically 2 (two) times daily.  Marland Kitchen gabapentin (NEURONTIN) 300 MG capsule TAKE 1 CAPSULE BY MOUTH 3 TIMES A DAY AS NEEDED FOR NEUROPATHY  . levothyroxine (SYNTHROID, LEVOTHROID) 200 MCG tablet Take 200 mcg by mouth daily before breakfast.  . losartan (COZAAR) 50 MG tablet Take 50 mg by mouth daily.  . metFORMIN (GLUCOPHAGE) 500 MG tablet Take by mouth 2 (two) times daily with a meal.  . pseudoephedrine (SUDAFED) 60 MG tablet Take 120 mg by mouth every 4 (four) hours as needed for congestion (q 12 hr/  extended release tab).  . tadalafil (CIALIS) 20 MG tablet tadalafil 20 mg tablet  TAKE 1 TABLET BY MOUTH AS NEEDED PRIOR TO SEXUAL ACTIVITY  . temazepam (RESTORIL) 30 MG capsule Take 30 mg by mouth at bedtime as needed for sleep.  Marland Kitchen topiramate (TOPAMAX) 25 MG tablet Take 2 tablets (50 mg total) by mouth daily. TAKE ONE TABLET BY MOUTH EVERY NIGHT AT BEDTIME FOR 7 DAYS THEN TAKE TWO TABLETS BY MOUTH EVERY NIGHT AT BEDTIME THEREAFTER  . traMADol (ULTRAM) 50 MG tablet Take 1 tablet (50 mg total) by mouth every 6 (six) hours as needed.   No facility-administered encounter medications on file as of 02/03/2020.    Allergies: Allergies  Allergen Reactions  . 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 Avelox [Moxifloxacin Hcl In Nacl] Nausea And Vomiting    "made me real sick"  . Penicillins     REACTION: severe GI upset    Family History: Family History  Problem Relation Age of Onset  . Colon cancer Father   . Lung cancer Father   . Atrial fibrillation Mother   . Autoimmune disease Sister   . Healthy Daughter   . Healthy Son     Social History: Social History   Socioeconomic History  . Marital status: Married  Spouse name: Not on file  . Number of children: 2  . Years of education: BS  . Highest education level: Bachelor's degree (e.g., BA, AB, BS)  Occupational History  . Occupation: Arts development officer  Tobacco Use  . Smoking status: Current Every Day Smoker    Packs/day: 0.50    Types: Cigarettes  . Smokeless tobacco: Former Systems developer  . Tobacco comment: 1/2 PACK A DAY  Substance and Sexual Activity  . Alcohol use: Yes    Comment: social  . Drug use: No  . Sexual activity: Not on file  Other Topics Concern  . Not on file  Social History Narrative   Married, lives with wife and two teenage children. He and his sister alternate with their parents living with them every other month. His parents are in their upper 28's. He drinks 4-5 cups of  coffee a day and 2-3 sodas or glasses of tea. He does not get any regular exercise.   Social Determinants of Health   Financial Resource Strain:   . Difficulty of Paying Living Expenses:   Food Insecurity:   . Worried About Charity fundraiser in the Last Year:   . Arboriculturist in the Last Year:   Transportation Needs:   . Film/video editor (Medical):   Marland Kitchen Lack of Transportation (Non-Medical):   Physical Activity:   . Days of Exercise per Week:   . Minutes of Exercise per Session:   Stress:   . Feeling of Stress :   Social Connections:   . Frequency of Communication with Friends and Family:   . Frequency of Social Gatherings with Friends and Family:   . Attends Religious Services:   . Active Member of Clubs or Organizations:   . Attends Archivist Meetings:   Marland Kitchen Marital Status:   Intimate Partner Violence:   . Fear of Current or Ex-Partner:   . Emotionally Abused:   Marland Kitchen Physically Abused:   . Sexually Abused:     Observations/Objective:   There were no vitals taken for this visit. No acute distress.  Alert and oriented.  Speech fluent and not dysarthric.  Language intact.  Eyes orthophoric on primary gaze.  Face symmetric.  Assessment and Plan:   1.  Tension-type headache, not intractable.  Improved. 2.  Balance disorder.  Likely related to diabetic neuropathy.  Symptoms started when he developed diabetes 3.  Intermittent diplopia.  Likely related to hypothyroidism (which reportedly isn't controlled).  He is concerned about myasthenia gravis as well, so we will check antibodies.  1.  For preventative management, we will increase topiramate to 100mg  at bedtime 2.  For abortive therapy, tramadol 3.  Will check myasthenia panel 4.  Limit use of pain relievers to no more than 2 days out of week to prevent risk of rebound or medication-overuse headache. 5.  Keep headache diary 6.  Exercise, hydration, caffeine cessation, sleep hygiene, monitor for and avoid  triggers 7. Follow up 4 months.   Follow Up Instructions:    -I discussed the assessment and treatment plan with the patient. The patient was provided an opportunity to ask questions and all were answered. The patient agreed with the plan and demonstrated an understanding of the instructions.   The patient was advised to call back or seek an in-person evaluation if the symptoms worsen or if the condition fails to improve as anticipated.    Dudley Major, DO

## 2020-02-07 ENCOUNTER — Telehealth: Payer: Self-pay | Admitting: Hematology

## 2020-02-07 NOTE — Telephone Encounter (Signed)
Rescheduled 04/22 to 04/30 due to providers pal, patient has been called and notified.

## 2020-02-11 ENCOUNTER — Other Ambulatory Visit: Payer: Self-pay

## 2020-02-11 ENCOUNTER — Other Ambulatory Visit: Payer: 59

## 2020-02-13 ENCOUNTER — Ambulatory Visit: Payer: 59 | Admitting: Hematology

## 2020-02-13 ENCOUNTER — Other Ambulatory Visit: Payer: 59

## 2020-02-16 LAB — MYASTHENIA GRAVIS PANEL 1
A CHR BINDING ABS: <0.3 nmol/L
STRIATED MUSCLE AB SCREEN: NEGATIVE

## 2020-02-18 ENCOUNTER — Inpatient Hospital Stay (HOSPITAL_BASED_OUTPATIENT_CLINIC_OR_DEPARTMENT_OTHER): Payer: 59 | Admitting: Hematology

## 2020-02-18 ENCOUNTER — Other Ambulatory Visit: Payer: Self-pay

## 2020-02-18 ENCOUNTER — Other Ambulatory Visit: Payer: Self-pay | Admitting: *Deleted

## 2020-02-18 ENCOUNTER — Inpatient Hospital Stay: Payer: 59 | Attending: Hematology

## 2020-02-18 VITALS — BP 128/96 | HR 87 | Temp 98.2°F | Resp 18 | Ht 72.0 in | Wt 224.4 lb

## 2020-02-18 DIAGNOSIS — Z801 Family history of malignant neoplasm of trachea, bronchus and lung: Secondary | ICD-10-CM | POA: Diagnosis not present

## 2020-02-18 DIAGNOSIS — I7090 Unspecified atherosclerosis: Secondary | ICD-10-CM | POA: Insufficient documentation

## 2020-02-18 DIAGNOSIS — Z800 Family history of malignant neoplasm of digestive organs: Secondary | ICD-10-CM | POA: Insufficient documentation

## 2020-02-18 DIAGNOSIS — N269 Renal sclerosis, unspecified: Secondary | ICD-10-CM | POA: Diagnosis not present

## 2020-02-18 DIAGNOSIS — D472 Monoclonal gammopathy: Secondary | ICD-10-CM

## 2020-02-18 DIAGNOSIS — I10 Essential (primary) hypertension: Secondary | ICD-10-CM | POA: Insufficient documentation

## 2020-02-18 DIAGNOSIS — E119 Type 2 diabetes mellitus without complications: Secondary | ICD-10-CM | POA: Insufficient documentation

## 2020-02-18 DIAGNOSIS — F17210 Nicotine dependence, cigarettes, uncomplicated: Secondary | ICD-10-CM | POA: Diagnosis not present

## 2020-02-18 LAB — CBC WITH DIFFERENTIAL (CANCER CENTER ONLY)
Abs Immature Granulocytes: 0.02 10*3/uL (ref 0.00–0.07)
Basophils Absolute: 0.1 10*3/uL (ref 0.0–0.1)
Basophils Relative: 1 %
Eosinophils Absolute: 0.3 10*3/uL (ref 0.0–0.5)
Eosinophils Relative: 2 %
HCT: 43.1 % (ref 39.0–52.0)
Hemoglobin: 14.2 g/dL (ref 13.0–17.0)
Immature Granulocytes: 0 %
Lymphocytes Relative: 23 %
Lymphs Abs: 2.4 10*3/uL (ref 0.7–4.0)
MCH: 28.5 pg (ref 26.0–34.0)
MCHC: 32.9 g/dL (ref 30.0–36.0)
MCV: 86.4 fL (ref 80.0–100.0)
Monocytes Absolute: 0.5 10*3/uL (ref 0.1–1.0)
Monocytes Relative: 5 %
Neutro Abs: 7.1 10*3/uL (ref 1.7–7.7)
Neutrophils Relative %: 69 %
Platelet Count: 295 10*3/uL (ref 150–400)
RBC: 4.99 MIL/uL (ref 4.22–5.81)
RDW: 13.4 % (ref 11.5–15.5)
WBC Count: 10.3 10*3/uL (ref 4.0–10.5)
nRBC: 0 % (ref 0.0–0.2)

## 2020-02-18 LAB — CMP (CANCER CENTER ONLY)
ALT: 21 U/L (ref 0–44)
AST: 16 U/L (ref 15–41)
Albumin: 3.9 g/dL (ref 3.5–5.0)
Alkaline Phosphatase: 102 U/L (ref 38–126)
Anion gap: 11 (ref 5–15)
BUN: 14 mg/dL (ref 6–20)
CO2: 22 mmol/L (ref 22–32)
Calcium: 9.4 mg/dL (ref 8.9–10.3)
Chloride: 108 mmol/L (ref 98–111)
Creatinine: 1.02 mg/dL (ref 0.61–1.24)
GFR, Est AFR Am: >60 mL/min (ref >60)
GFR, Estimated: >60 mL/min (ref >60)
Glucose, Bld: 150 mg/dL — ABNORMAL HIGH (ref 70–99)
Potassium: 4.3 mmol/L (ref 3.5–5.1)
Sodium: 141 mmol/L (ref 135–145)
Total Bilirubin: 0.3 mg/dL (ref 0.3–1.2)
Total Protein: 7.8 g/dL (ref 6.5–8.1)

## 2020-02-18 LAB — LACTATE DEHYDROGENASE: LDH: 173 U/L (ref 98–192)

## 2020-02-18 NOTE — Progress Notes (Signed)
HEMATOLOGY/ONCOLOGY CONSULTATION NOTE  Date of Service: 02/18/2020  Patient Care Team: [name], PA-C as PCP - General (Family Medicine) Brunetta Genera, MD as Consulting Physician (Hematology)  REFERRING PHYSICIAN: [name], PA-C  CHIEF COMPLAINTS/PURPOSE OF CONSULTATION:  Positive M-Spike   HISTORY OF PRESENTING ILLNESS:   Dustin Sampson is a wonderful 54 y.o. 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  The pt reports that he is doing well overall.  The pt reports that he went to PCP and the doctor noticed an increase in protein in urine. He notes that he takes large amount of Advil 4-10 200 mg pills a day for a year or longer .    The pt reports having diabetes for 5 years and hypertension for 10 years   Most recent lab results (06/20/2019) of CBC and CMP is as follows: all values are WNL except for Glucose at 137 and Phosphorus at 4.5. 06/20/2019 MMP revealed an Alpha-2-Globulin of 1.3 with an M-Spike of 0.5. 06/20/2019 K/L light chains with a Kappa light chain at 34.9 and a K:L of 1.55. 06/20/2019 Albumin (Urine) at 1091.3 with an ZCH:YIFOY of 1026.   06/20/2019 HepB sAg screen negative. HepB Surface Ab Qual Non-Reactive. HepB core Ab Negative.  06/20/2019 HCV Ab at <0.1. HIV Screen Non-Reactive. RPR Non-Reactive.  .  He has an extensive history with elevated Creatinine: 05/19/2016 Creat 0.96 11/30/2017 Creatinine 0.98, TSH 24.10, Hgb 14.4 05/10/2018 Microalbumin/Creatinine at 1938.6 11/12/2018 Microalbumin/Creatinine at 3136.6, Creatinine 0.89.    On PMHx the pt reports surgery on wrist Diabetes for 2 years. 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   On Social Hx the pt reports he lives at home with his spouse. He has two children. He is a current smoker; he smokes half a to one pk/day.   INTERVAL HISTORY:  Dustin Sampson  is a 54 y.o. 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 The patient's last visit with [name] was on 08/15/2019. The pt reports that he is doing well overall.  The pt reports that his BP and Diabetes have been well-controlled in the interim. He is currently using Metformin and had discussed transitioning to a new medication with his Nephrologist. Pt is unsure about switching medications due to significant increase in cost. He has been vaccinated against the COVID19 virus and had minimal side effects with his vaccine.   Lab results today (02/18/20) of CBC w/diff and CMP is as follows: all values are WNL except for Glucose at 150. 02/18/2020 LDH at 173 02/18/2020 MMP is in progress 02/18/2020 K/L light chains are in progress 02/18/2020 Beta 2 microglobulin is in progress  On review of systems, pt denies leg swelling, abdominal pain any other symptoms.    MEDICAL HISTORY:  Past Medical History:  Diagnosis Date  . Diabetes mellitus without complication ([place])   . Hyperlipemia   . Hypertension   . Insomnia   . Thyroid disease      SURGICAL HISTORY: Past Surgical History:  Procedure Laterality Date  . COLONOSCOPY     x2  . NASAL SINUS SURGERY    . WISDOM TOOTH EXTRACTION    . WRIST SURGERY     bilat     SOCIAL HISTORY: Social History   Socioeconomic History  . Marital status: Married    Spouse name: Not on file  . Number of children: 2  .  Years of education: BS  . Highest education level: Bachelor's degree (e.g., BA, AB, BS)  Occupational History  . Occupation: Arts development officer  Tobacco Use  . Smoking status: Current Every Day Smoker    Packs/day: 0.50    Types: Cigarettes  . Smokeless tobacco: Former Systems developer  . Tobacco comment: 1/2 PACK A DAY  Substance and Sexual Activity  . Alcohol use: Yes    Comment: social  . Drug use: No  . Sexual activity: Not on file  Other Topics Concern  . Not on file  Social History Narrative   Married, lives with  wife and two teenage children. He and his sister alternate with their parents living with them every other month. His parents are in their upper 35's. He drinks 4-5 cups of coffee a day and 2-3 sodas or glasses of tea. He does not get any regular exercise.   Social Determinants of Health   Financial Resource Strain:   . Difficulty of Paying Living Expenses:   Food Insecurity:   . Worried About Charity fundraiser in the Last Year:   . Arboriculturist in the Last Year:   Transportation Needs:   . Film/video editor (Medical):   Marland Kitchen Lack of Transportation (Non-Medical):   Physical Activity:   . Days of Exercise per Week:   . Minutes of Exercise per Session:   Stress:   . Feeling of Stress :   Social Connections:   . Frequency of Communication with Friends and Family:   . Frequency of Social Gatherings with Friends and Family:   . Attends Religious Services:   . Active Member of Clubs or Organizations:   . Attends Archivist Meetings:   Marland Kitchen Marital Status:   Intimate Partner Violence:   . Fear of Current or Ex-Partner:   . Emotionally Abused:   Marland Kitchen Physically Abused:   . Sexually Abused:      FAMILY HISTORY: Family History  Problem Relation Age of Onset  . Colon cancer Father   . Lung cancer Father   . Atrial fibrillation Mother   . Autoimmune disease Sister   . Healthy Daughter   . Healthy Son      ALLERGIES:   is allergic to aspirin; avelox [moxifloxacin hcl in nacl]; and penicillins.   MEDICATIONS:  Current Outpatient Medications  Medication Sig Dispense Refill  . atorvastatin (LIPITOR) 10 MG tablet Take 10 mg by mouth daily.    . butalbital-acetaminophen-caffeine (FIORICET) 50-325-40 MG tablet butalbital-acetaminophen-caffeine 50 mg-325 mg-40 mg tablet  TAKE 1 TABLET BY MOUTH EVERY 4 HOURS AS NEEDED 15    . fluorouracil (EFUDEX) 5 % cream Apply topically 2 (two) times daily. 40 g 1  . gabapentin (NEURONTIN) 300 MG capsule TAKE 1 CAPSULE BY MOUTH 3 TIMES  A DAY AS NEEDED FOR NEUROPATHY    . levothyroxine (SYNTHROID, LEVOTHROID) 200 MCG tablet Take 200 mcg by mouth daily before breakfast.    . losartan (COZAAR) 50 MG tablet Take 50 mg by mouth daily.    . metFORMIN (GLUCOPHAGE) 500 MG tablet Take by mouth 2 (two) times daily with a meal.    . pseudoephedrine (SUDAFED) 60 MG tablet Take 120 mg by mouth every 4 (four) hours as needed for congestion (q 12 hr/ extended release tab).    . tadalafil (CIALIS) 20 MG tablet tadalafil 20 mg tablet  TAKE 1 TABLET BY MOUTH AS NEEDED PRIOR TO SEXUAL ACTIVITY    . temazepam (RESTORIL) 30 MG capsule  Take 30 mg by mouth at bedtime as needed for sleep.    Marland Kitchen topiramate (TOPAMAX) 100 MG tablet Take 1 tablet (100 mg total) by mouth at bedtime. 30 tablet 3  . traMADol (ULTRAM) 50 MG tablet Take 1 tablet (50 mg total) by mouth every 6 (six) hours as needed. 10 tablet 1   No current facility-administered medications for this visit.     REVIEW OF SYSTEMS:   A 10+ POINT REVIEW OF SYSTEMS WAS OBTAINED including neurology, dermatology, psychiatry, cardiac, respiratory, lymph, extremities, GI, GU, Musculoskeletal, constitutional, breasts, reproductive, HEENT.  All pertinent positives are noted in the HPI.  All others are negative.   PHYSICAL EXAMINATION: Vitals:   02/18/20 0927  BP: (!) 128/96  Pulse: 87  Resp: 18  Temp: 98.2 F (36.8 C)  SpO2: 100%   Wt Readings from Last 3 Encounters:  02/18/20 224 lb 6.4 oz (101.8 kg)  01/31/20 225 lb (102.1 kg)  09/23/19 225 lb (102.1 kg)   Body mass index is 30.43 kg/m.    GENERAL:alert, in no acute distress and comfortable SKIN: no acute rashes, no significant lesions EYES: conjunctiva are pink and non-injected, sclera anicteric OROPHARYNX: MMM, no exudates, no oropharyngeal erythema or ulceration NECK: supple, no JVD LYMPH:  no palpable lymphadenopathy in the cervical, axillary or inguinal regions LUNGS: clear to auscultation b/l with normal respiratory  effort HEART: regular rate & rhythm ABDOMEN:  normoactive bowel sounds , non tender, not distended. No palpable hepatosplenomegaly.  Extremity: no pedal edema PSYCH: alert & oriented x 3 with fluent speech NEURO: no focal motor/sensory deficits  LABORATORY DATA:  I have reviewed the data as listed  CBC Latest Ref Rng & Units 02/18/2020 09/23/2019 08/05/2019  WBC 4.0 - 10.5 K/uL 10.3 11.1(H) 9.9  Hemoglobin 13.0 - 17.0 g/dL 14.2 13.6 13.8  Hematocrit 39.0 - 52.0 % 43.1 40.2 41.6  Platelets 150 - 400 K/uL 295 281 267   . CMP Latest Ref Rng & Units 02/18/2020 09/23/2019 07/25/2019  Glucose 70 - 99 mg/dL 150(H) 144(H) 99  BUN 6 - 20 mg/dL '14 12 13  ' Creatinine 0.61 - 1.24 mg/dL 1.02 0.78 0.91  Sodium 135 - 145 mmol/L 141 137 140  Potassium 3.5 - 5.1 mmol/L 4.3 4.0 4.3  Chloride 98 - 111 mmol/L 108 104 103  CO2 22 - 32 mmol/L '22 23 25  ' Calcium 8.9 - 10.3 mg/dL 9.4 9.2 9.5  Total Protein 6.5 - 8.1 g/dL 7.8 - 7.6  Total Bilirubin 0.3 - 1.2 mg/dL 0.3 - 0.2(L)  Alkaline Phos 38 - 126 U/L 102 - 106  AST 15 - 41 U/L 16 - 19  ALT 0 - 44 U/L 21 - 38   . CMP Latest Ref Rng & Units 02/18/2020 09/23/2019 07/25/2019  Glucose 70 - 99 mg/dL 150(H) 144(H) 99  BUN 6 - 20 mg/dL '14 12 13  ' Creatinine 0.61 - 1.24 mg/dL 1.02 0.78 0.91  Sodium 135 - 145 mmol/L 141 137 140  Potassium 3.5 - 5.1 mmol/L 4.3 4.0 4.3  Chloride 98 - 111 mmol/L 108 104 103  CO2 22 - 32 mmol/L '22 23 25  ' Calcium 8.9 - 10.3 mg/dL 9.4 9.2 9.5  Total Protein 6.5 - 8.1 g/dL 7.8 - 7.6  Total Bilirubin 0.3 - 1.2 mg/dL 0.3 - 0.2(L)  Alkaline Phos 38 - 126 U/L 102 - 106  AST 15 - 41 U/L 16 - 19  ALT 0 - 44 U/L 21 - 38   Component  Latest Ref Rng & Units 07/25/2019 07/29/2019  Total Protein, Urine-UPE24     Not Estab. mg/dL  93.8  Total Protein, Urine-Ur/day     30 - 150 mg/24 hr  1,360 (H)  ALBUMIN, U     %  83.7  ALPHA 1 URINE     %  3.6  Alpha 2, Urine     %  2.7  % BETA, Urine     %  7.3  GAMMA GLOBULIN URINE     %   2.6  Free Kappa Lt Chains,Ur     0.63 - 113.79 mg/L  29.98  Free Lambda Lt Chains,Ur     0.47 - 11.77 mg/L  2.30  Free Kappa/Lambda Ratio     1.03 - 31.76  13.03  Immunofixation Result, Urine       Comment  Total Volume       1,450  M-SPIKE %, Urine     Not Observed %  1.0 (H)  M-Spike, mg/24 hr     Not Observed mg/24 hr  14 (H)  NOTE:       Comment  IgG (Immunoglobin G), Serum     603 - 1,613 mg/dL 972   IgA     90 - 386 mg/dL 212   IgM (Immunoglobulin M), Srm     20 - 172 mg/dL 59   Total Protein ELP     6.0 - 8.5 g/dL 7.1   Albumin SerPl Elph-Mcnc     2.9 - 4.4 g/dL 3.7   Alpha 1     0.0 - 0.4 g/dL 0.2   Alpha2 Glob SerPl Elph-Mcnc     0.4 - 1.0 g/dL 1.2 (H)   B-Globulin SerPl Elph-Mcnc     0.7 - 1.3 g/dL 1.0   Gamma Glob SerPl Elph-Mcnc     0.4 - 1.8 g/dL 1.0   M Protein SerPl Elph-Mcnc     Not Observed g/dL 0.5 (H)   Globulin, Total     2.2 - 3.9 g/dL 3.4   Albumin/Glob SerPl     0.7 - 1.7 1.1   IFE 1      Comment   Please Note (HCV):      Comment   Kappa free light chain     3.3 - 19.4 mg/L 28.6 (H)   Lamda free light chains     5.7 - 26.3 mg/L 16.1   Kappa, lamda light chain ratio     0.26 - 1.65 1.78 (H)   Beta-2 Microglobulin     0.6 - 2.4 mg/L 1.6   LDH     98 - 192 U/L 139   Sed Rate     0 - 16 mm/hr 30 (H)     08/05/2019(Accession:[id number])CT-GUIDED BONE MARROW BIOPSY AND ASPIRATION    08/05/2019 Cytogenetics analysis     06/20/2019 Labs   RADIOGRAPHIC STUDIES: I have personally reviewed the radiological images as listed and agreed with the findings in the report. No results found.   ASSESSMENT & PLAN:  Dustin Sampson is a 54 y.o. 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 08/05/2019 blood test showed his monoclonal  IgG kappa M protein spike hasn't changed since 05/2019.  2. Proteinuria ? Related to HTN, DM2  - no evidence of AL Amyloidosis or light chain deposition in  kidneys 09/23/2019 Kidney Biopsy revealed "Focal and segmental glomerulosclerosis, NOS with moderate interstitial fibrosis and tubular atrophy. Mild arteriosclerosis. No evidence of monoclonal  immunogloubulin deposition disease. No evidence of an immune complex mediate or active glomerulonephritis/renal disease."  PLAN: -Discussed pt labwork today, 02/18/20; blood counts and chemistries are normal -Discussed 02/18/2020 Beta 2 microglobulin is in progress -Discussed 02/18/2020 K/L light chains are in progress -Discussed 02/18/2020 MMP is in progress - will continue to monitor M Protein and f/u as appropriate -Discussed 09/23/2019 Kidney Biopsy which revealed "Focal and segmental glomerulosclerosis, NOS with moderate interstitial fibrosis and tubular atrophy. Mild arteriosclerosis. No evidence of monoclonal immunogloubulin deposition disease. No evidence of an immune complex mediate or active glomerulonephritis/renal disease." -Discussed CRAB criteria: no hypercalcemia, renal dysfunction that is otherwise explained, no anemia, no known bone tumors -Would classify this as MGUS due to lack of CRAB criteria  -Recommend pt f/u with PCP to keep DM II and HTN well-controlled  -Recommend pt f/u with Dr. Madelon Lips  -Will see back in 12 months, with labs 1 week prior    FOLLOW UP: RTC with Dr Irene Limbo in 12 months with labs schedule 1 week prior to clinic visit   The total time spent in the appt was 20 minutes and more than 50% was on counseling and direct patient cares.  All of the patient's questions were answered with apparent satisfaction. The patient knows to call the clinic with any problems, questions or concerns.     Sullivan Lone MD [place] [facility]IV[facility] Hematology/Oncology Physician [facility]  (Office):       [phone] (Work cell):  [phone] (Fax):           [phone]  02/18/2020 3:59 PM  I, Yevette Edwards, am acting as a scribe for Dr. Sullivan Lone.   .I  have reviewed the above documentation for accuracy and completeness, and I agree with the above. Brunetta Genera MD

## 2020-02-19 LAB — BETA 2 MICROGLOBULIN, SERUM: Beta-2 Microglobulin: 2.3 mg/L (ref 0.6–2.4)

## 2020-02-19 LAB — KAPPA/LAMBDA LIGHT CHAINS
Kappa free light chain: 40 mg/L — ABNORMAL HIGH (ref 3.3–19.4)
Kappa, lambda light chain ratio: 2.29 — ABNORMAL HIGH (ref 0.26–1.65)
Lambda free light chains: 17.5 mg/L (ref 5.7–26.3)

## 2020-02-20 ENCOUNTER — Telehealth: Payer: Self-pay | Admitting: Hematology

## 2020-02-20 NOTE — Telephone Encounter (Signed)
Scheduled per 04/07 los, patient has been called and notified. 

## 2020-02-21 LAB — MULTIPLE MYELOMA PANEL, SERUM
Albumin SerPl Elph-Mcnc: 3.7 g/dL (ref 2.9–4.4)
Albumin/Glob SerPl: 1.1 (ref 0.7–1.7)
Alpha 1: 0.2 g/dL (ref 0.0–0.4)
Alpha2 Glob SerPl Elph-Mcnc: 1 g/dL (ref 0.4–1.0)
B-Globulin SerPl Elph-Mcnc: 1.2 g/dL (ref 0.7–1.3)
Gamma Glob SerPl Elph-Mcnc: 1.1 g/dL (ref 0.4–1.8)
Globulin, Total: 3.6 g/dL (ref 2.2–3.9)
IgA: 241 mg/dL (ref 90–386)
IgG (Immunoglobin G), Serum: 1122 mg/dL (ref 603–1613)
IgM (Immunoglobulin M), Srm: 80 mg/dL (ref 20–172)
M Protein SerPl Elph-Mcnc: 0.6 g/dL — ABNORMAL HIGH
Total Protein ELP: 7.3 g/dL (ref 6.0–8.5)

## 2020-03-26 DIAGNOSIS — E119 Type 2 diabetes mellitus without complications: Secondary | ICD-10-CM | POA: Insufficient documentation

## 2020-03-26 DIAGNOSIS — I10 Essential (primary) hypertension: Secondary | ICD-10-CM | POA: Insufficient documentation

## 2020-03-26 DIAGNOSIS — E039 Hypothyroidism, unspecified: Secondary | ICD-10-CM | POA: Insufficient documentation

## 2020-06-11 ENCOUNTER — Other Ambulatory Visit: Payer: Self-pay | Admitting: Neurology

## 2020-06-11 ENCOUNTER — Telehealth: Payer: Self-pay | Admitting: Neurology

## 2020-06-11 NOTE — Telephone Encounter (Signed)
Patient called and left a message that he is continuing to have problems with his balance. He'd like to know if Dr. Tomi Likens will see him for this problem or if he will need a referral specifically for that problem. Please advise?

## 2020-06-11 NOTE — Telephone Encounter (Signed)
Yes, he should make an in-office visit with me for further evaluation.

## 2020-06-12 NOTE — Telephone Encounter (Signed)
Pt advised to schedule a visit.

## 2020-06-15 NOTE — Telephone Encounter (Signed)
Patient's voice mail is full, unable to leave a message to call back for scheduling.

## 2020-07-09 NOTE — Progress Notes (Signed)
NEUROLOGY FOLLOW UP OFFICE NOTE  Dustin Sampson [id number]  HISTORY OF PRESENT ILLNESS: Dustin Sampson is a 54year old male with hypertension, hyperlipidemia, monoclonal paraproteinemia,and hypothyroidism whofollows up for balance problems  UPDATE: Labs from April:  Myasthenia gravis panel (striated muscle Ab, AchR binding Ab) negative;  SPEP/IFE showed IgG monoclonal protein with kappa light chain specificity.  He feels that his legs are weak.  If he walks 200 years, he has pain, described as tightness in back of his ankles and calves.  If he rests, he feels okay. He has some low back pain and notes some numbness in the feet.  He takes gabapentin which isn't too helpful.  He may spend a couple of hours standing working in the yard and when he later is standing in the shower, it feels like his legs will buckle.  He feels unsteady.  No dizziness.  He tried stopping topiramate but it did not improve his balance.    HISTORY: He has had balance problems since around 2018 but has gotten worse.  It is not associated with dizziness.  He feels wobbly when he is walking.  It is intermittent.  He also reports that he sometimes has double vision, such as while driving or watching TV and he needs to shut one eye.  He went to the eye doctor and thought that he may have had astigmatism.  He reports droopy eyelids and is concerned about myasthenia gravis (doctors thought his father may have had MG, but that was ruled out).  He does have hypothyroidism which he reports is currently not well-treated on Synthroid and is supposed to see an endocrinologist.  He has diabetes but does not know his Hgb A1c.  He had prediabetes for years which progressed to diabetes about 3 years ago.  He has a history of poor sleep and takes Restoril. He does not feel fatigued during the day. His wife says he does not snore much. He is a cigarette smoker. He does not drink alcohol. He does not exercise routinely. He says he hydrates  with water during the day.   He has had chronic headaches all of his life. He reports history of migraines, tension type headache and "sinus" headaches.Hereports a constant moderate pressure headache (sometimes throbbing) located above either eye (right more than left) and back of the head. He has associated neck tightness. It is constant. On only rare occasions is there nausea. Otherwise, no nausea, vomiting, photophobia, phonophobia or visual disturbance. Alcohol is a trigger. Nothing really relieves it except for pain relievers. 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  I saw him in October 2018. At that time, he reported takingFioricet twice daily (for 20 years),Advil daily (to treat headache as well as joint pain), drankcaffeine every morning andat takes would takeExcedrin as well. He alsowould usean intranasal decongestant. At that time, I had started him on nortriptyline and given him instructions on how to taper off of Fioricet. He never followed up.  Past NSAIDS:Advil Past analgesics:Excedrin; Fioricet Past abortive triptans:none Past abortive ergotamine:none Past muscle relaxants:none Past anti-emetic:none Past antihypertensive medications:none Past antidepressant medications:Nortriptyline 25mg  (did not do well with it) Past anticonvulsant medications:none Past anti-CGRP:none Past vitamins/Herbal/Supplements:none Other past therapies:none  PAST MEDICAL HISTORY: Past Medical History:  Diagnosis Date   Diabetes mellitus without complication ([place])    Hyperlipemia    Hypertension    Insomnia    Thyroid disease  MEDICATIONS: Current Outpatient Medications on File Prior to Visit  Medication Sig Dispense Refill   atorvastatin (LIPITOR) 10 MG tablet Take 10 mg by mouth daily.     butalbital-acetaminophen-caffeine  (FIORICET) 50-325-40 MG tablet butalbital-acetaminophen-caffeine 50 mg-325 mg-40 mg tablet  TAKE 1 TABLET BY MOUTH EVERY 4 HOURS AS NEEDED 15     fluorouracil (EFUDEX) 5 % cream Apply topically 2 (two) times daily. 40 g 1   gabapentin (NEURONTIN) 300 MG capsule TAKE 1 CAPSULE BY MOUTH 3 TIMES A DAY AS NEEDED FOR NEUROPATHY     levothyroxine (SYNTHROID, LEVOTHROID) 200 MCG tablet Take 200 mcg by mouth daily before breakfast.     losartan (COZAAR) 50 MG tablet Take 50 mg by mouth daily.     metFORMIN (GLUCOPHAGE) 500 MG tablet Take by mouth 2 (two) times daily with a meal.     pseudoephedrine (SUDAFED) 60 MG tablet Take 120 mg by mouth every 4 (four) hours as needed for congestion (q 12 hr/ extended release tab).     tadalafil (CIALIS) 20 MG tablet tadalafil 20 mg tablet  TAKE 1 TABLET BY MOUTH AS NEEDED PRIOR TO SEXUAL ACTIVITY     temazepam (RESTORIL) 30 MG capsule Take 30 mg by mouth at bedtime as needed for sleep.     topiramate (TOPAMAX) 100 MG tablet Take 1 tablet (100 mg total) by mouth at bedtime. 30 tablet 3   traMADol (ULTRAM) 50 MG tablet Take 1 tablet (50 mg total) by mouth every 6 (six) hours as needed. 10 tablet 1   No current facility-administered medications on file prior to visit.    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; "it makes me bleed"   Avelox [Moxifloxacin Hcl In Nacl] Nausea And Vomiting    "made me real sick"   Penicillins     REACTION: severe GI upset    FAMILY HISTORY: Family History  Problem Relation Age of Onset   Colon cancer Father    Lung cancer Father    Atrial fibrillation Mother    Autoimmune disease Sister    Healthy Daughter    Healthy Son     SOCIAL HISTORY: Social History   Socioeconomic History   Marital status: Married    Spouse name: Not on file   Number of children: 2   Years of education: BS   Highest education level: Bachelor's degree (e.g., BA, AB,  BS)  Occupational History   Occupation: Arts development officer  Tobacco Use   Smoking status: Current Every Day Smoker    Packs/day: 0.50    Types: Cigarettes   Smokeless tobacco: Former Systems developer   Tobacco comment: 1/2 PACK A DAY  Vaping Use   Vaping Use: Never used  Substance and Sexual Activity   Alcohol use: Yes    Comment: social   Drug use: No   Sexual activity: Not on file  Other Topics Concern   Not on file  Social History Narrative   Married, lives with wife and two teenage children. He and his sister alternate with their parents living with them every other month. His parents are in their upper 27's. He drinks 4-5 cups of coffee a day and 2-3 sodas or glasses of tea. He does not get any regular exercise.   Social Determinants of Health   Financial Resource Strain:    Difficulty of Paying Living Expenses: Not on file  Food Insecurity:    Worried About Charity fundraiser  in the Last Year: Not on file   Ran Out of Food in the Last Year: Not on file  Transportation Needs:    Lack of Transportation (Medical): Not on file   Lack of Transportation (Non-Medical): Not on file  Physical Activity:    Days of Exercise per Week: Not on file   Minutes of Exercise per Session: Not on file  Stress:    Feeling of Stress : Not on file  Social Connections:    Frequency of Communication with Friends and Family: Not on file   Frequency of Social Gatherings with Friends and Family: Not on file   Attends Religious Services: Not on file   Active Member of Clubs or Organizations: Not on file   Attends Archivist Meetings: Not on file   Marital Status: Not on file  Intimate Partner Violence:    Fear of Current or Ex-Partner: Not on file   Emotionally Abused: Not on file   Physically Abused: Not on file   Sexually Abused: Not on file    PHYSICAL EXAM: Blood pressure (!) 147/99, pulse 86, resp. rate 18, height 6\' 1"  (1.854 m), weight 214 lb (97.1 kg), SpO2  97 %. General: No acute distress.  Patient appears well-groomed.   Head:  Normocephalic/atraumatic Eyes:  Fundi examined but not visualized Neck: supple, no paraspinal tenderness, full range of motion Heart:  Regular rate and rhythm Lungs:  Clear to auscultation bilaterally Back: No paraspinal tenderness Neurological Exam: alert and oriented to person, place, and time. Attention span and concentration intact, recent and remote memory intact, fund of knowledge intact.  Speech fluent and not dysarthric, language intact.  CN II-XII intact. Bulk and tone normal, muscle strength 5/5 throughout.  Sensation to light touch, temperature and vibration intact.  Deep tendon reflexes 2+ throughout, toes downgoing.  Finger to nose and heel to shin testing intact.  Gait slightly unsteady.  Unable to tandem walk. Romberg negative  IMPRESSION: 1.  Balance disorder 2.  Diplopia  Given subjective leg weakness, pain with ambulation and back pain, consider lumbar spinal stenosis.  He is first going to have vascular studies to rule out PAD.  However, given his intermittent diplopia, I would first like to check MRI of brain.  PLAN: 1.  MRI of brain.  If unremarkable, consider MRI of lumbar spine 2.  Refilled tramadol for headaches 3.  Follow up in 6 months.  Metta Clines, DO  CC:  [name], PA-C

## 2020-07-10 ENCOUNTER — Ambulatory Visit (INDEPENDENT_AMBULATORY_CARE_PROVIDER_SITE_OTHER): Payer: 59 | Admitting: Neurology

## 2020-07-10 ENCOUNTER — Other Ambulatory Visit: Payer: Self-pay

## 2020-07-10 ENCOUNTER — Encounter: Payer: Self-pay | Admitting: Neurology

## 2020-07-10 VITALS — BP 147/99 | HR 86 | Resp 18 | Ht 73.0 in | Wt 214.0 lb

## 2020-07-10 DIAGNOSIS — H532 Diplopia: Secondary | ICD-10-CM | POA: Diagnosis not present

## 2020-07-10 DIAGNOSIS — G44219 Episodic tension-type headache, not intractable: Secondary | ICD-10-CM

## 2020-07-10 DIAGNOSIS — R2689 Other abnormalities of gait and mobility: Secondary | ICD-10-CM

## 2020-07-10 MED ORDER — TRAMADOL HCL 50 MG PO TABS: 50.0000 mg | ORAL_TABLET | Freq: Four times a day (QID) | ORAL | 2 refills | Status: DC | PRN

## 2020-07-10 NOTE — Patient Instructions (Addendum)
1.  Will get MRI of brain  2.  Refilled tramadol 3.  Further recommendations pending MRI results. 4.  Follow up in 6 months.  We have sent a referral to [facility] for your MRI and they will call you directly to schedule your appointment. They are located at [place]. If you need to contact them directly please call [phone].

## 2020-07-15 ENCOUNTER — Other Ambulatory Visit: Payer: Self-pay

## 2020-07-15 DIAGNOSIS — I739 Peripheral vascular disease, unspecified: Secondary | ICD-10-CM

## 2020-07-30 ENCOUNTER — Other Ambulatory Visit: Payer: Self-pay

## 2020-07-30 ENCOUNTER — Ambulatory Visit (INDEPENDENT_AMBULATORY_CARE_PROVIDER_SITE_OTHER): Payer: 59 | Admitting: Vascular Surgery

## 2020-07-30 ENCOUNTER — Telehealth: Payer: Self-pay

## 2020-07-30 ENCOUNTER — Ambulatory Visit (HOSPITAL_COMMUNITY)
Admission: RE | Admit: 2020-07-30 | Discharge: 2020-07-30 | Disposition: A | Discharge status: Home / Self Care | Payer: 59 | Source: Ambulatory Visit | Attending: Vascular Surgery | Admitting: Vascular Surgery

## 2020-07-30 ENCOUNTER — Encounter: Payer: Self-pay | Admitting: Vascular Surgery

## 2020-07-30 ENCOUNTER — Ambulatory Visit
Admission: RE | Admit: 2020-07-30 | Discharge: 2020-07-30 | Disposition: A | Discharge status: Home / Self Care | Payer: 59 | Source: Ambulatory Visit | Attending: Neurology | Admitting: Neurology

## 2020-07-30 VITALS — BP 132/83 | HR 91 | Temp 98.3°F | Resp 20 | Ht 73.0 in | Wt 216.0 lb

## 2020-07-30 DIAGNOSIS — G44219 Episodic tension-type headache, not intractable: Secondary | ICD-10-CM

## 2020-07-30 DIAGNOSIS — R2689 Other abnormalities of gait and mobility: Secondary | ICD-10-CM

## 2020-07-30 DIAGNOSIS — H532 Diplopia: Secondary | ICD-10-CM

## 2020-07-30 DIAGNOSIS — I739 Peripheral vascular disease, unspecified: Secondary | ICD-10-CM

## 2020-07-30 MED ORDER — ASPIRIN EC 81 MG PO TBEC: 81.0000 mg | DELAYED_RELEASE_TABLET | Freq: Every day | ORAL | 2 refills | Status: DC

## 2020-07-30 NOTE — H&P (View-Only) (Signed)
Referring Physician: Anice Paganini PA  Patient name: Dustin Sampson MRN: [id number] DOB: 09-Sep-1966 Sex: male  REASON FOR CONSULT: Bilateral claudication  HPI: Dustin Sampson is a 54 y.o. male, with approximately 5-year history of cramping and tightness in both calves when walking.  This currently occurs about 100 yards.  He states that it is slowly getting worse over the last 5 years.  He does not have rest pain in the feet.  He does not have nonhealing wounds.  He currently smokes about half pack of cigarettes per day.  He is considering quitting.  He also has some dizziness and balance issues.  He has recently been evaluated by Dr. Tomi Likens with neurology.  He had an MRI of the brain today which showed a chronic stroke in the right cerebellar and left caudate areas.  He is not currently on aspirin.  He is on a statin.  He also has a recent diagnosis of monoclonal gammopathy.  This is currently being observed.  He has not had significant decline in his serum creatinine.  Most recent serum creatinine that I was able to find was 1.0 on April 2021.  He also has a history of erectile dysfunction and has previously been evaluated by urology.  We did discuss today that there is certainly probably a component related to atherosclerosis as well as diabetes.  He states that his diabetes has been well controlled.  He is currently on Metformin.  He is also requesting today to have a cardiac evaluation due to all of these other recent problems.  He does not currently have chest pain.  He does have some occasional shortness of breath.  He also occasionally has wheezing.  Other medical problems include hypertension hyperlipidemia both of which are currently controlled.  Past Medical History:  Diagnosis Date  . Diabetes mellitus without complication ([place])   . Hyperlipemia   . Hypertension   . Insomnia   . Thyroid disease    Past Surgical History:  Procedure Laterality Date  . COLONOSCOPY     x2  . NASAL SINUS  SURGERY    . WISDOM TOOTH EXTRACTION    . WRIST SURGERY     bilat    Family History  Problem Relation Age of Onset  . Colon cancer Father   . Lung cancer Father   . Atrial fibrillation Mother   . Autoimmune disease Sister   . Healthy Daughter   . Healthy Son     SOCIAL HISTORY: Social History   Socioeconomic History  . Marital status: Married    Spouse name: Not on file  . Number of children: 2  . Years of education: BS  . Highest education level: Bachelor's degree (e.g., BA, AB, BS)  Occupational History  . Occupation: Arts development officer  Tobacco Use  . Smoking status: Current Every Day Smoker    Packs/day: 0.50    Types: Cigarettes  . Smokeless tobacco: Former Systems developer  . Tobacco comment: 1/2 PACK A DAY  Vaping Use  . Vaping Use: Never used  Substance and Sexual Activity  . Alcohol use: Yes    Comment: social  . Drug use: No  . Sexual activity: Not on file  Other Topics Concern  . Not on file  Social History Narrative   Married, lives with wife and two teenage children. He and his sister alternate with their parents living with them every other month. His parents are in their upper 1's. He drinks 4-5 cups of coffee a day  and 2-3 sodas or glasses of tea. He does not get any regular exercise. Right handed   One story home   Social Determinants of Health   Financial Resource Strain:   . Difficulty of Paying Living Expenses: Not on file  Food Insecurity:   . Worried About Charity fundraiser in the Last Year: Not on file  . Ran Out of Food in the Last Year: Not on file  Transportation Needs:   . Lack of Transportation (Medical): Not on file  . Lack of Transportation (Non-Medical): Not on file  Physical Activity:   . Days of Exercise per Week: Not on file  . Minutes of Exercise per Session: Not on file  Stress:   . Feeling of Stress : Not on file  Social Connections:   . Frequency of Communication with Friends and Family: Not on file  . Frequency of Social  Gatherings with Friends and Family: Not on file  . Attends Religious Services: Not on file  . Active Member of Clubs or Organizations: Not on file  . Attends Archivist Meetings: Not on file  . Marital Status: Not on file  Intimate Partner Violence:   . Fear of Current or Ex-Partner: Not on file  . Emotionally Abused: Not on file  . Physically Abused: Not on file  . Sexually Abused: Not on file    Allergies  Allergen Reactions  . 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 Avelox [Moxifloxacin Hcl In Nacl] Nausea And Vomiting    "made me real sick"  . Penicillins     REACTION: severe GI upset    Current Outpatient Medications  Medication Sig Dispense Refill  . atorvastatin (LIPITOR) 10 MG tablet Take 10 mg by mouth daily.    . butalbital-acetaminophen-caffeine (FIORICET) 50-325-40 MG tablet butalbital-acetaminophen-caffeine 50 mg-325 mg-40 mg tablet  TAKE 1 TABLET BY MOUTH EVERY 4 HOURS AS NEEDED 15    . fluorouracil (EFUDEX) 5 % cream Apply topically 2 (two) times daily. 40 g 1  . gabapentin (NEURONTIN) 300 MG capsule TAKE 1 CAPSULE BY MOUTH 3 TIMES A DAY AS NEEDED FOR NEUROPATHY    . levothyroxine (SYNTHROID, LEVOTHROID) 200 MCG tablet Take 200 mcg by mouth daily before breakfast.    . losartan (COZAAR) 50 MG tablet Take 50 mg by mouth daily.    . metFORMIN (GLUCOPHAGE) 500 MG tablet Take by mouth 2 (two) times daily with a meal.    . pseudoephedrine (SUDAFED) 60 MG tablet Take 120 mg by mouth every 4 (four) hours as needed for congestion (q 12 hr/ extended release tab).    . tadalafil (CIALIS) 20 MG tablet tadalafil 20 mg tablet  TAKE 1 TABLET BY MOUTH AS NEEDED PRIOR TO SEXUAL ACTIVITY    . temazepam (RESTORIL) 30 MG capsule Take 30 mg by mouth at bedtime as needed for sleep.    . traMADol (ULTRAM) 50 MG tablet Take 1 tablet (50 mg total) by mouth every 6 (six) hours as needed. 10 tablet 2  . topiramate (TOPAMAX) 100 MG tablet Take 1  tablet (100 mg total) by mouth at bedtime. (Patient not taking: Reported on 07/30/2020) 30 tablet 3   No current facility-administered medications for this visit.    ROS:   General:  No weight loss, Fever, chills  HEENT: No recent headaches, no nasal bleeding, no visual changes, no sore throat  Neurologic: No dizziness, blackouts, seizures. No recent symptoms of stroke or  mini- stroke. No recent episodes of slurred speech, or temporary blindness.  Cardiac: No recent episodes of chest pain/pressure, no shortness of breath at rest.  No shortness of breath with exertion.  Denies history of atrial fibrillation or irregular heartbeat  Vascular: No history of rest pain in feet.  No history of claudication.  No history of non-healing ulcer, No history of DVT   Pulmonary: No home oxygen, no productive cough, no hemoptysis,  No asthma or wheezing  Musculoskeletal:  [ ]  Arthritis, [ ]  Low back pain,  [ ]  Joint pain  Hematologic:No history of hypercoagulable state.  No history of easy bleeding.  No history of anemia  Gastrointestinal: No hematochezia or melena,  No gastroesophageal reflux, no trouble swallowing  Urinary: [ ]  chronic Kidney disease, [ ]  on HD - [ ]  MWF or [ ]  TTHS, [ ]  Burning with urination, [ ]  Frequent urination, [ ]  Difficulty urinating;   Skin: No rashes  Psychological: No history of anxiety,  No history of depression   Physical Examination  Vitals:   07/30/20 0932  BP: 132/83  Pulse: 91  Resp: 20  Temp: 98.3 F (36.8 C)  SpO2: 94%  Weight: 216 lb (98 kg)  Height: 6\' 1"  (1.854 m)    Body mass index is 28.5 kg/m.  General:  Alert and oriented, no acute distress HEENT: Normal Neck: No JVD Cardiac: Regular Rate and Rhythm Abdomen: Soft, non-tender, non-distended, no mass Skin: No rash Extremity Pulses:  2+ radial, brachial, 1+ left 2+ right femoral, absent popliteal dorsalis pedis, posterior tibial pulses bilaterally Musculoskeletal: No deformity or  edema  Neurologic: Upper and lower extremity motor 5/5 and symmetric  DATA:  Patient had bilateral ABIs performed today which were 0.6 bilaterally.  I reviewed and interpreted the study.  I also reviewed the patient's MRI of the brain performed earlier today which shows a chronic right cerebellar and left caudate stroke.  ASSESSMENT: 1.  Peripheral arterial disease most likely bilateral superficial femoral artery occlusions.  Claudication symptoms currently patient feels fairly debilitated by this and definitely thinks his lifestyle has been inhibited.  Currently not at risk of limb loss.  Discussed with patient risk of limb loss is 100 fold increased with combination of smoking and diabetes.  Smoking cessation counseling greater than 3 minutes today.  2.  Chronic stroke mostly deep brain probably secondary to hypertension but need to rule out embolic event in light of his peripheral arterial disease.  3.  Risk of coronary artery disease with existence of peripheral arterial disease.     PLAN: 1.  Smoking cessation  2.  Abdominal aortogram bilateral extremity runoff with possible intervention scheduled for August 14, 2020.  Risk benefits possible complications and procedure details including but not limited to bleeding infection contrast reaction vessel injury worsening of symptoms possible limb loss were discussed with the patient today.  He understands and agrees to proceed.  Also discussed with patient a graduated walking program of 30 minutes daily that he could try prior to embarking on the stent procedure but he has opted for an intervention at this point.  He will also try to continue to walk.  3.  Bilateral carotid duplex scan to rule out carotid stenosis.  We will schedule this for him in the next 2 to 4 weeks.  I will follow up with him on the results with a virtual visit.  4.  Referral to cardiology for evaluation for coronary artery disease given the fact that he  has had a stroke  has peripheral arterial disease as risk factors for coronary artery disease.  5.  Patient will start taking aspirin 81 mg once daily today.  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   [name], MD [facility] Office: [phone]  Addendum: Patient had a carotid duplex exam on July 31, 2020.  This showed no significant right internal carotid artery stenosis.  Left side showed 40 to 60% stenosis.  I called the patient and discussed these findings with him.  He will need a repeat carotid duplex exam in 1 year and be seen in our APP clinic.  [name], MD [facility] Office: [phone]

## 2020-07-30 NOTE — Telephone Encounter (Signed)
[facility] reached out about the ASA RX and ASA allergy. Dr. Oneida Alar had addressed it in the notes with the patient and I called patient to double confirm since the documented allergy stated anaphylaxis in addition to Reye's syndrome. Patient says he has no problem taking ASA containing products today. He requested that the RX be canceled by the pharmacy as he would pick it up over the counter.Called pharmacy about allergy/cancellation request. Confirmation received

## 2020-07-30 NOTE — Progress Notes (Addendum)
Referring Physician: Anice Paganini PA  Patient name: Dustin Sampson MRN: [id number] DOB: June 02, 1966 Sex: male  REASON FOR CONSULT: Bilateral claudication  HPI: Dustin Sampson is a 54 y.o. male, with approximately 5-year history of cramping and tightness in both calves when walking.  This currently occurs about 100 yards.  He states that it is slowly getting worse over the last 5 years.  He does not have rest pain in the feet.  He does not have nonhealing wounds.  He currently smokes about half pack of cigarettes per day.  He is considering quitting.  He also has some dizziness and balance issues.  He has recently been evaluated by Dr. Tomi Likens with neurology.  He had an MRI of the brain today which showed a chronic stroke in the right cerebellar and left caudate areas.  He is not currently on aspirin.  He is on a statin.  He also has a recent diagnosis of monoclonal gammopathy.  This is currently being observed.  He has not had significant decline in his serum creatinine.  Most recent serum creatinine that I was able to find was 1.0 on April 2021.  He also has a history of erectile dysfunction and has previously been evaluated by urology.  We did discuss today that there is certainly probably a component related to atherosclerosis as well as diabetes.  He states that his diabetes has been well controlled.  He is currently on Metformin.  He is also requesting today to have a cardiac evaluation due to all of these other recent problems.  He does not currently have chest pain.  He does have some occasional shortness of breath.  He also occasionally has wheezing.  Other medical problems include hypertension hyperlipidemia both of which are currently controlled.  Past Medical History:  Diagnosis Date  . Diabetes mellitus without complication ([place])   . Hyperlipemia   . Hypertension   . Insomnia   . Thyroid disease    Past Surgical History:  Procedure Laterality Date  . COLONOSCOPY     x2  . NASAL SINUS  SURGERY    . WISDOM TOOTH EXTRACTION    . WRIST SURGERY     bilat    Family History  Problem Relation Age of Onset  . Colon cancer Father   . Lung cancer Father   . Atrial fibrillation Mother   . Autoimmune disease Sister   . Healthy Daughter   . Healthy Son     SOCIAL HISTORY: Social History   Socioeconomic History  . Marital status: Married    Spouse name: Not on file  . Number of children: 2  . Years of education: BS  . Highest education level: Bachelor's degree (e.g., BA, AB, BS)  Occupational History  . Occupation: Arts development officer  Tobacco Use  . Smoking status: Current Every Day Smoker    Packs/day: 0.50    Types: Cigarettes  . Smokeless tobacco: Former Systems developer  . Tobacco comment: 1/2 PACK A DAY  Vaping Use  . Vaping Use: Never used  Substance and Sexual Activity  . Alcohol use: Yes    Comment: social  . Drug use: No  . Sexual activity: Not on file  Other Topics Concern  . Not on file  Social History Narrative   Married, lives with wife and two teenage children. He and his sister alternate with their parents living with them every other month. His parents are in their upper 42's. He drinks 4-5 cups of coffee a day  and 2-3 sodas or glasses of tea. He does not get any regular exercise. Right handed   One story home   Social Determinants of Health   Financial Resource Strain:   . Difficulty of Paying Living Expenses: Not on file  Food Insecurity:   . Worried About Charity fundraiser in the Last Year: Not on file  . Ran Out of Food in the Last Year: Not on file  Transportation Needs:   . Lack of Transportation (Medical): Not on file  . Lack of Transportation (Non-Medical): Not on file  Physical Activity:   . Days of Exercise per Week: Not on file  . Minutes of Exercise per Session: Not on file  Stress:   . Feeling of Stress : Not on file  Social Connections:   . Frequency of Communication with Friends and Family: Not on file  . Frequency of Social  Gatherings with Friends and Family: Not on file  . Attends Religious Services: Not on file  . Active Member of Clubs or Organizations: Not on file  . Attends Archivist Meetings: Not on file  . Marital Status: Not on file  Intimate Partner Violence:   . Fear of Current or Ex-Partner: Not on file  . Emotionally Abused: Not on file  . Physically Abused: Not on file  . Sexually Abused: Not on file    Allergies  Allergen Reactions  . 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 Avelox [Moxifloxacin Hcl In Nacl] Nausea And Vomiting    "made me real sick"  . Penicillins     REACTION: severe GI upset    Current Outpatient Medications  Medication Sig Dispense Refill  . atorvastatin (LIPITOR) 10 MG tablet Take 10 mg by mouth daily.    . butalbital-acetaminophen-caffeine (FIORICET) 50-325-40 MG tablet butalbital-acetaminophen-caffeine 50 mg-325 mg-40 mg tablet  TAKE 1 TABLET BY MOUTH EVERY 4 HOURS AS NEEDED 15    . fluorouracil (EFUDEX) 5 % cream Apply topically 2 (two) times daily. 40 g 1  . gabapentin (NEURONTIN) 300 MG capsule TAKE 1 CAPSULE BY MOUTH 3 TIMES A DAY AS NEEDED FOR NEUROPATHY    . levothyroxine (SYNTHROID, LEVOTHROID) 200 MCG tablet Take 200 mcg by mouth daily before breakfast.    . losartan (COZAAR) 50 MG tablet Take 50 mg by mouth daily.    . metFORMIN (GLUCOPHAGE) 500 MG tablet Take by mouth 2 (two) times daily with a meal.    . pseudoephedrine (SUDAFED) 60 MG tablet Take 120 mg by mouth every 4 (four) hours as needed for congestion (q 12 hr/ extended release tab).    . tadalafil (CIALIS) 20 MG tablet tadalafil 20 mg tablet  TAKE 1 TABLET BY MOUTH AS NEEDED PRIOR TO SEXUAL ACTIVITY    . temazepam (RESTORIL) 30 MG capsule Take 30 mg by mouth at bedtime as needed for sleep.    . traMADol (ULTRAM) 50 MG tablet Take 1 tablet (50 mg total) by mouth every 6 (six) hours as needed. 10 tablet 2  . topiramate (TOPAMAX) 100 MG tablet Take 1  tablet (100 mg total) by mouth at bedtime. (Patient not taking: Reported on 07/30/2020) 30 tablet 3   No current facility-administered medications for this visit.    ROS:   General:  No weight loss, Fever, chills  HEENT: No recent headaches, no nasal bleeding, no visual changes, no sore throat  Neurologic: No dizziness, blackouts, seizures. No recent symptoms of stroke or  mini- stroke. No recent episodes of slurred speech, or temporary blindness.  Cardiac: No recent episodes of chest pain/pressure, no shortness of breath at rest.  No shortness of breath with exertion.  Denies history of atrial fibrillation or irregular heartbeat  Vascular: No history of rest pain in feet.  No history of claudication.  No history of non-healing ulcer, No history of DVT   Pulmonary: No home oxygen, no productive cough, no hemoptysis,  No asthma or wheezing  Musculoskeletal:  [ ]  Arthritis, [ ]  Low back pain,  [ ]  Joint pain  Hematologic:No history of hypercoagulable state.  No history of easy bleeding.  No history of anemia  Gastrointestinal: No hematochezia or melena,  No gastroesophageal reflux, no trouble swallowing  Urinary: [ ]  chronic Kidney disease, [ ]  on HD - [ ]  MWF or [ ]  TTHS, [ ]  Burning with urination, [ ]  Frequent urination, [ ]  Difficulty urinating;   Skin: No rashes  Psychological: No history of anxiety,  No history of depression   Physical Examination  Vitals:   07/30/20 0932  BP: 132/83  Pulse: 91  Resp: 20  Temp: 98.3 F (36.8 C)  SpO2: 94%  Weight: 216 lb (98 kg)  Height: 6\' 1"  (1.854 m)    Body mass index is 28.5 kg/m.  General:  Alert and oriented, no acute distress HEENT: Normal Neck: No JVD Cardiac: Regular Rate and Rhythm Abdomen: Soft, non-tender, non-distended, no mass Skin: No rash Extremity Pulses:  2+ radial, brachial, 1+ left 2+ right femoral, absent popliteal dorsalis pedis, posterior tibial pulses bilaterally Musculoskeletal: No deformity or  edema  Neurologic: Upper and lower extremity motor 5/5 and symmetric  DATA:  Patient had bilateral ABIs performed today which were 0.6 bilaterally.  I reviewed and interpreted the study.  I also reviewed the patient's MRI of the brain performed earlier today which shows a chronic right cerebellar and left caudate stroke.  ASSESSMENT: 1.  Peripheral arterial disease most likely bilateral superficial femoral artery occlusions.  Claudication symptoms currently patient feels fairly debilitated by this and definitely thinks his lifestyle has been inhibited.  Currently not at risk of limb loss.  Discussed with patient risk of limb loss is 100 fold increased with combination of smoking and diabetes.  Smoking cessation counseling greater than 3 minutes today.  2.  Chronic stroke mostly deep brain probably secondary to hypertension but need to rule out embolic event in light of his peripheral arterial disease.  3.  Risk of coronary artery disease with existence of peripheral arterial disease.     PLAN: 1.  Smoking cessation  2.  Abdominal aortogram bilateral extremity runoff with possible intervention scheduled for August 14, 2020.  Risk benefits possible complications and procedure details including but not limited to bleeding infection contrast reaction vessel injury worsening of symptoms possible limb loss were discussed with the patient today.  He understands and agrees to proceed.  Also discussed with patient a graduated walking program of 30 minutes daily that he could try prior to embarking on the stent procedure but he has opted for an intervention at this point.  He will also try to continue to walk.  3.  Bilateral carotid duplex scan to rule out carotid stenosis.  We will schedule this for him in the next 2 to 4 weeks.  I will follow up with him on the results with a virtual visit.  4.  Referral to cardiology for evaluation for coronary artery disease given the fact that he  has had a stroke  has peripheral arterial disease as risk factors for coronary artery disease.  5.  Patient will start taking aspirin 81 mg once daily today.  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   [name], MD [facility] Office: [phone]  Addendum: Patient had a carotid duplex exam on July 31, 2020.  This showed no significant right internal carotid artery stenosis.  Left side showed 40 to 60% stenosis.  I called the patient and discussed these findings with him.  He will need a repeat carotid duplex exam in 1 year and be seen in our APP clinic.  [name], MD [facility] Office: [phone]

## 2020-07-31 ENCOUNTER — Other Ambulatory Visit (HOSPITAL_COMMUNITY): Payer: Self-pay | Admitting: Vascular Surgery

## 2020-07-31 ENCOUNTER — Telehealth: Payer: Self-pay

## 2020-07-31 ENCOUNTER — Ambulatory Visit (HOSPITAL_COMMUNITY)
Admission: RE | Admit: 2020-07-31 | Discharge: 2020-07-31 | Disposition: A | Discharge status: Home / Self Care | Payer: 59 | Source: Ambulatory Visit | Attending: Vascular Surgery | Admitting: Vascular Surgery

## 2020-07-31 DIAGNOSIS — I6529 Occlusion and stenosis of unspecified carotid artery: Secondary | ICD-10-CM

## 2020-07-31 NOTE — Telephone Encounter (Signed)
Patient called with question about how long he would be out of work after an aortogram. Explained it was normally one to two days, but it could depend on how he's healing. His procedure is on a Friday. He asked if he could return to work on Wednesday. I said Monday or Tuesday probably, but definitely Wednesday if procedure goes as scheduled.

## 2020-08-05 ENCOUNTER — Other Ambulatory Visit: Payer: Self-pay | Admitting: *Deleted

## 2020-08-05 DIAGNOSIS — I739 Peripheral vascular disease, unspecified: Secondary | ICD-10-CM

## 2020-08-06 ENCOUNTER — Telehealth: Payer: Self-pay

## 2020-08-06 NOTE — Telephone Encounter (Signed)
I called patient to inform him that [facility] reached out to our office requesting advice on Aspirin Rx.  Aspirin is listed as an allergy in his medical history.  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  He would rather purchase baby aspirin OTC instead of getting a prescription.  Thurston Hole., LPN

## 2020-08-10 ENCOUNTER — Telehealth: Payer: Self-pay

## 2020-08-10 ENCOUNTER — Telehealth: Payer: Self-pay | Admitting: Neurology

## 2020-08-10 DIAGNOSIS — H538 Other visual disturbances: Secondary | ICD-10-CM

## 2020-08-10 DIAGNOSIS — R2689 Other abnormalities of gait and mobility: Secondary | ICD-10-CM

## 2020-08-10 NOTE — Telephone Encounter (Signed)
Contacted pt and updated on arrival time of 0900 AM for procedure at [facility] on 08/14/20. Pt voiced understanding.

## 2020-08-10 NOTE — Telephone Encounter (Signed)
I Joeph't know what to say about the stents.  But PAD can cause balance problems.  As far as blurred vision, I would have him see ophthalmology

## 2020-08-10 NOTE — Telephone Encounter (Signed)
Pt advised and referral faxed over.

## 2020-08-10 NOTE — Telephone Encounter (Signed)
Pt called and left a message on the VM  Needing to speak to a nurse. He is having surgery on 10-22-221 for his legs. They think that will help with balance issue but he is concerned it will not help with the other issues such as burred vision when he is having balance problems. Please call

## 2020-08-10 NOTE — Telephone Encounter (Signed)
Pt having Surgery with DR. Fields, they are putting stints in. But pt doesn't  think that this  is the reason he is having the balance issues. He stilling having the blurred vision.    Please advise?

## 2020-08-11 ENCOUNTER — Other Ambulatory Visit: Payer: Self-pay

## 2020-08-11 ENCOUNTER — Encounter: Payer: Self-pay | Admitting: Cardiology

## 2020-08-11 ENCOUNTER — Ambulatory Visit (INDEPENDENT_AMBULATORY_CARE_PROVIDER_SITE_OTHER): Payer: 59 | Admitting: Cardiology

## 2020-08-11 VITALS — BP 112/80 | HR 92 | Ht 73.0 in | Wt 215.0 lb

## 2020-08-11 DIAGNOSIS — I739 Peripheral vascular disease, unspecified: Secondary | ICD-10-CM | POA: Diagnosis not present

## 2020-08-11 DIAGNOSIS — I700 Atherosclerosis of aorta: Secondary | ICD-10-CM

## 2020-08-11 DIAGNOSIS — E785 Hyperlipidemia, unspecified: Secondary | ICD-10-CM

## 2020-08-11 DIAGNOSIS — Z716 Tobacco abuse counseling: Secondary | ICD-10-CM

## 2020-08-11 DIAGNOSIS — Z720 Tobacco use: Secondary | ICD-10-CM

## 2020-08-11 DIAGNOSIS — R072 Precordial pain: Secondary | ICD-10-CM

## 2020-08-11 DIAGNOSIS — I10 Essential (primary) hypertension: Secondary | ICD-10-CM

## 2020-08-11 DIAGNOSIS — E1169 Type 2 diabetes mellitus with other specified complication: Secondary | ICD-10-CM

## 2020-08-11 DIAGNOSIS — Z7189 Other specified counseling: Secondary | ICD-10-CM

## 2020-08-11 NOTE — Progress Notes (Signed)
Cardiology Office Note:    Date:  08/11/2020   ID:  Dustin Sampson, DOB October 25, 1965, MRN [id number]  PCP:  [name], PA-C  Cardiologist:  Buford Dresser, MD  Referring MD: [name], PA-C   CC: new patient consultation for the evaluation and management of CAD risk  History of Present Illness:    Dustin Sampson is a 53 y.o. male with a hx of hypertension, hyperlipidemia, type II diabetes, hypothyroidism, claudication, currently tobacco use, MGUS who is seen as a new consult at the request of [name], PA-C for the evaluation and management of risk for CAD.  He is scheduled to have abdominal/lower extremity angiogram with Dr. Oneida Alar on 10/22 for claudication symptoms. Per note 10/7, absent DP and PT pulses bilaterally. Bilateral ABIs 0.6. He was also seen by Dr. Tomi Likens in neurology and found to have chronic stroke on MRI (right cerebellar, left caudate).   Per Dr. Oneida Alar note from 07/30/20, referred to cardiology for evaluation of CAD given history of CVA, PAD, and risk factors.  Today: Patient's main concern is making sure his heart is ok. Rare chest pain, tightness on the left upper chest, worst at night when he lays down to sleep. Nothing makes it better or worse. No associated symptoms. Happens about once/week.   Cardiovascular risk factors: Prior clinical ASCVD: being evaluated for PAD. Concern for prior silent CVA based on MRI Comorbid conditions: Endorses hypertension, hyperlipidemia, diabetes, chronic kidney disease Metabolic syndrome/Obesity: Highest adult weight was 238 lbs, working on weight loss now Tobacco use history: still smoking, working on weaning off. Down from 1 ppd from 1/2 ppd.  Family history: father had 4-5 stents, no heart attack that he knows of. Mother has afib.  Prior cardiac testing and/or incidental findings on other testing (ie coronary calcium): none available. No coronary calcium commented on CT from 05/2019. I personally reviewed.  There is aortic calcification/atherosclerosis. The study is not clear, but there is either annular aortic valve calcification or calcium in the proximal left system. Cannot be clearly seen on this study. Exercise level: struggles to walk around the block with his wife. On his feet all day. Leg pain is his limiting factor Current diet: tries to eat healthy. Minimal fast food.  Denies PND, orthopnea, or unexpected weight gain. No syncope or palpitations. Has intermittent mild swelling of his legs. Does endorse balance issues, double vision.   Reviewed medication list today. Takes excedrin daily, which contains aspirin. Does not take an actual aspirin 81 mg daily. Taking atorvastatin.   We discussed both nuclear stress and CT cardiac today. Last Cr 1.02. Has had a kidney biopsy, told he had scarring. Wants to avoid as much potential injury to kidneys as possible.  Past Medical History:  Diagnosis Date  . Diabetes mellitus without complication ([place])   . Hyperlipemia   . Hypertension   . Insomnia   . Thyroid disease     Past Surgical History:  Procedure Laterality Date  . COLONOSCOPY     x2  . NASAL SINUS SURGERY    . WISDOM TOOTH EXTRACTION    . WRIST SURGERY     bilat    Current Medications: Current Outpatient Medications on File Prior to Visit  Medication Sig  . acetaminophen (TYLENOL) 500 MG tablet Take 500-1,000 mg by mouth every 6 (six) hours as needed (for pain.).  Marland Kitchen aspirin EC 81 MG tablet Take 1 tablet (81 mg total) by mouth daily. (Patient taking differently: Take 81 mg by mouth every evening. )  .  atorvastatin (LIPITOR) 80 MG tablet Take 80 mg by mouth every evening.  . Febuxostat (ULORIC) 80 MG TABS Take 80 mg by mouth every evening.  . gabapentin (NEURONTIN) 300 MG capsule Take 300 mg by mouth in the morning, at noon, and at bedtime.   Marland Kitchen levothyroxine (SYNTHROID, LEVOTHROID) 200 MCG tablet Take 200 mcg by mouth every evening.   Marland Kitchen losartan (COZAAR) 100 MG tablet Take 100  mg by mouth every evening.   . metFORMIN (GLUCOPHAGE) 500 MG tablet Take 500 mg by mouth every evening.   . pseudoephedrine (SUDAFED) 120 MG 12 hr tablet Take 120 mg by mouth 2 (two) times daily as needed for congestion.  . tadalafil (CIALIS) 20 MG tablet tadalafil 20 mg tablet  TAKE 1 TABLET BY MOUTH AS NEEDED PRIOR TO SEXUAL ACTIVITY  . temazepam (RESTORIL) 30 MG capsule Take 30 mg by mouth at bedtime.   . topiramate (TOPAMAX) 100 MG tablet Take 1 tablet (100 mg total) by mouth at bedtime.  . traMADol (ULTRAM) 50 MG tablet Take 1 tablet (50 mg total) by mouth every 6 (six) hours as needed. (Patient taking differently: Take 50 mg by mouth every 6 (six) hours as needed (pain.). )   No current facility-administered medications on file prior to visit.     Allergies:   Aspirin, Avelox [moxifloxacin hcl in nacl], and Penicillins   Social History   Tobacco Use  . Smoking status: Current Every Day Smoker    Packs/day: 0.50    Types: Cigarettes  . Smokeless tobacco: Former Systems developer  . Tobacco comment: 1/2 PACK A DAY  Vaping Use  . Vaping Use: Never used  Substance Use Topics  . Alcohol use: Yes    Comment: social  . Drug use: No    Family History: family history includes Atrial fibrillation in his mother; Autoimmune disease in his sister; Colon cancer in his father; Healthy in his daughter and son; Lung cancer in his father.  ROS:   Please see the history of present illness.  Additional pertinent ROS: Constitutional: Negative for chills, fever, night sweats, unintentional weight loss  HENT: Negative for ear pain and hearing loss.   Eyes: Negative for loss of vision and eye pain.  Respiratory: Negative for cough, sputum, wheezing.   Cardiovascular: See HPI. Gastrointestinal: Negative for abdominal pain, melena, and hematochezia.  Genitourinary: Negative for dysuria and hematuria.  Musculoskeletal: Negative for falls and myalgias.  Skin: Negative for itching and rash.  Neurological:  Negative for focal weakness, focal sensory changes and loss of consciousness.  Endo/Heme/Allergies: Does not bruise/bleed easily.     EKGs/Labs/Other Studies Reviewed:    The following studies were reviewed today: ABI 08-29-20 Right: Resting right ankle-brachial index indicates moderate right lower  extremity arterial disease. The right toe-brachial index is abnormal.   Left: Resting left ankle-brachial index indicates moderate left lower  extremity arterial disease. The left toe-brachial index is abnormal.  Carotids 07/31/20 Right Carotid: Velocities in the right ICA are consistent with a 1-39%  stenosis.   Left Carotid: Velocities in the left ICA are consistent with a 40-59%  stenosis.   CT chest WO contrast 06/17/19 personally reviewed, aortic calcium consistent with aortic atherosclerosis, coronaries not well seen/cannot exclude calcium   EKG:  EKG is personally reviewed.  The ekg ordered today demonstrates NSR at 92 bpm  Recent Labs: 02/18/2020: ALT 21; BUN 14; Creatinine 1.02; Hemoglobin 14.2; Platelet Count 295; Potassium 4.3; Sodium 141  Recent Lipid Panel No results found for: CHOL,  TRIG, HDL, CHOLHDL, VLDL, LDLCALC, LDLDIRECT  Physical Exam:    VS:  BP 112/80   Pulse 92   Ht 6\' 1"  (1.854 m)   Wt 215 lb (97.5 kg)   SpO2 96%   BMI 28.37 kg/m     Wt Readings from Last 3 Encounters:  08/11/20 215 lb (97.5 kg)  07/30/20 216 lb (98 kg)  07/10/20 214 lb (97.1 kg)    GEN: Well nourished, well developed in no acute distress HEENT: Normal, moist mucous membranes NECK: No JVD CARDIAC: regular rhythm, normal S1 and S2, no rubs or gallops. No murmurs. VASCULAR: Radial pulses 2+ bilaterally. No carotid bruits appreciated. Distal LE pulses not palpated, but they are warm. RESPIRATORY:  Inspiratory wheezing, prolonged expiratory phase, mild rhonchi diffusely without rales ABDOMEN: Soft, non-tender, non-distended MUSCULOSKELETAL:  Ambulates independently SKIN: Warm and dry,  no edema NEUROLOGIC:  Alert and oriented x 3. No focal neuro deficits noted. PSYCHIATRIC:  Normal affect    ASSESSMENT:    1. Precordial pain   2. Aortic atherosclerosis (HCC)   3. Claudication ([place])   4. Essential hypertension   5. Type 2 diabetes mellitus with hyperlipidemia ([place])   6. Tobacco use   7. Tobacco abuse counseling   8. Cardiac risk counseling   9. Counseling on health promotion and disease prevention    PLAN:    Precordial pain Aortic atherosclerosis Claudication, with moderate PAD in lower extremities by ABI Hypertension Type II diabetes with hyperlipidemia Ongoing tobacco use -while his symptoms are atypical for cardiac cause of chest pain, he has a very high risk given his comorbid conditions -discussed nuclear stress/lexiscan and CT coronary angiography. Discussed pros and cons of each, including but not limited to false positive/false negative risk, radiation risk, and risk of IV contrast dye. Based on shared decision making, decision was made to pursue lexiscan myoview stress test -has pending angiography with Dr. Oneida Alar for PAD -aortic atherosclerosis seen on noncontrast CT in 2020 on my review -continue aspirin 81 mg, atorvastatin 80 mg for ASCVD risk/PAD. Last LDL 68, but TG 310 (unknown if fasting) -continue losartan for diabetes/hypertension -consider SGLT2i or GLP1RA in the future -counseled on role of smoking and counseled to quit completely -discussed red flag warning signs that need immediate medical attention  Tobacco abuse counseling:The patient was counseled on tobacco cessation today for 4 minutes.  Counseling included reviewing the risks of smoking tobacco products, how it impacts the patient's current medical diagnoses and different strategies for quitting.  Pharmacotherapy to aid in tobacco cessation was not prescribed today.  Cardiac risk counseling and prevention recommendations: -recommend heart healthy/Mediterranean diet, with whole  grains, fruits, vegetable, fish, lean meats, nuts, and olive oil. Limit salt. -recommend moderate walking, 3-5 times/week for 30-50 minutes each session. Aim for at least 150 minutes.week. Goal should be pace of 3 miles/hours, or walking 1.5 miles in 30 minutes -recommend avoidance of tobacco products. Avoid excess alcohol.  Plan for follow up: if myoview normal, needs follow up annually given risk. If myoview abnormal, will bring back ASAP to discuss next steps  Buford Dresser, MD, PhD [place]  [facility]    Medication Adjustments/Labs and Tests Ordered: Current medicines are reviewed at length with the patient today.  Concerns regarding medicines are outlined above.  Orders Placed This Encounter  Procedures  . MYOCARDIAL PERFUSION IMAGING  . EKG 12-Lead   No orders of the defined types were placed in this encounter.   Patient Instructions  Medication Instructions:  Your Physician recommend you continue on your current medication as directed.    *If you need a refill on your cardiac medications before your next appointment, please call your pharmacy*   Lab Work: None ordered  If you have labs (blood work) drawn today and your tests are completely normal, you will receive your results only by: Marland Kitchen MyChart Message (if you have MyChart) OR . A paper copy in the mail If you have any lab test that is abnormal or we need to change your treatment, we will call you to review the results.   Testing/Procedures: Your physician has requested that you have a lexiscan myoview. For further information please visit [email or web address]. Please follow instruction sheet, as given. [place]. [address]    Follow-Up: At [facility], you and your health needs are our priority.  As part of our continuing mission to provide you with exceptional heart care, we have created designated Provider Care Teams.  These Care Teams include your primary Cardiologist (physician) and  Advanced Practice Providers (APPs -  Physician Assistants and Nurse Practitioners) who all work together to provide you with the care you need, when you need it.  We recommend signing up for the patient portal called "MyChart".  Sign up information is provided on this After Visit Summary.  MyChart is used to connect with patients for Virtual Visits (Telemedicine).  Patients are able to view lab/test results, encounter notes, upcoming appointments, etc.  Non-urgent messages can be sent to your provider as well.   To learn more about what you can do with MyChart, go to [email or web address].    Your next appointment:   1 year(s)  The format for your next appointment:   In Person  Provider:   Buford Dresser, MD  You are scheduled for a Myocardial Perfusion Imaging Study on.  Please arrive 15 minutes prior to your appointment time for registration and insurance purposes.  The test will take approximately 3 to 4 hours to complete; you may bring reading material.  If someone comes with you to your appointment, they will need to remain in the main lobby due to limited space in the testing area. **If you are pregnant or breastfeeding, please notify the nuclear lab prior to your appointment**  How to prepare for your Myocardial Perfusion Test: . Do not eat or drink 3 hours prior to your test, except you may have water. . Do not consume products containing caffeine (regular or decaffeinated) 12 hours prior to your test. (ex: coffee, chocolate, sodas, tea). . Do bring a list of your current medications with you.  If not listed below, you may take your medications as normal. . Do wear comfortable clothes (no dresses or overalls) and walking shoes, tennis shoes preferred (No heels or open toe shoes are allowed). . Do NOT wear cologne, perfume, aftershave, or lotions (deodorant is allowed). . If these instructions are not followed, your test will have to be rescheduled.  Please report to [place], [address] for your test.  If you have questions or concerns about your appointment, you can call the Nuclear Lab at [phone].  If you cannot keep your appointment, please provide 24 hours notification to the Nuclear Lab, to avoid a possible $50 charge to your account.    Signed, Buford Dresser, MD PhD 08/11/2020 6:21 PM    [place]

## 2020-08-11 NOTE — Patient Instructions (Signed)
Medication Instructions:  Your Physician recommend you continue on your current medication as directed.    *If you need a refill on your cardiac medications before your next appointment, please call your pharmacy*   Lab Work: None ordered  If you have labs (blood work) drawn today and your tests are completely normal, you will receive your results only by: Marland Kitchen MyChart Message (if you have MyChart) OR . A paper copy in the mail If you have any lab test that is abnormal or we need to change your treatment, we will call you to review the results.   Testing/Procedures: Your physician has requested that you have a lexiscan myoview. For further information please visit [email or web address]. Please follow instruction sheet, as given. [place]. [address]    Follow-Up: At [facility], you and your health needs are our priority.  As part of our continuing mission to provide you with exceptional heart care, we have created designated Provider Care Teams.  These Care Teams include your primary Cardiologist (physician) and Advanced Practice Providers (APPs -  Physician Assistants and Nurse Practitioners) who all work together to provide you with the care you need, when you need it.  We recommend signing up for the patient portal called "MyChart".  Sign up information is provided on this After Visit Summary.  MyChart is used to connect with patients for Virtual Visits (Telemedicine).  Patients are able to view lab/test results, encounter notes, upcoming appointments, etc.  Non-urgent messages can be sent to your provider as well.   To learn more about what you can do with MyChart, go to [email or web address].    Your next appointment:   1 year(s)  The format for your next appointment:   In Person  Provider:   Buford Dresser, MD  You are scheduled for a Myocardial Perfusion Imaging Study on.  Please arrive 15 minutes prior to your appointment time for registration and  insurance purposes.  The test will take approximately 3 to 4 hours to complete; you may bring reading material.  If someone comes with you to your appointment, they will need to remain in the main lobby due to limited space in the testing area. **If you are pregnant or breastfeeding, please notify the nuclear lab prior to your appointment**  How to prepare for your Myocardial Perfusion Test: . Do not eat or drink 3 hours prior to your test, except you may have water. . Do not consume products containing caffeine (regular or decaffeinated) 12 hours prior to your test. (ex: coffee, chocolate, sodas, tea). . Do bring a list of your current medications with you.  If not listed below, you may take your medications as normal. . Do wear comfortable clothes (no dresses or overalls) and walking shoes, tennis shoes preferred (No heels or open toe shoes are allowed). . Do NOT wear cologne, perfume, aftershave, or lotions (deodorant is allowed). . If these instructions are not followed, your test will have to be rescheduled.  Please report to [place], [address] for your test.  If you have questions or concerns about your appointment, you can call the Nuclear Lab at [phone].  If you cannot keep your appointment, please provide 24 hours notification to the Nuclear Lab, to avoid a possible $50 charge to your account.

## 2020-08-13 ENCOUNTER — Telehealth (HOSPITAL_COMMUNITY): Payer: Self-pay | Admitting: *Deleted

## 2020-08-13 ENCOUNTER — Other Ambulatory Visit (HOSPITAL_COMMUNITY)
Admission: RE | Admit: 2020-08-13 | Discharge: 2020-08-13 | Disposition: A | Discharge status: Home / Self Care | Payer: 59 | Source: Ambulatory Visit | Attending: Vascular Surgery | Admitting: Vascular Surgery

## 2020-08-13 DIAGNOSIS — Z20822 Contact with and (suspected) exposure to covid-19: Secondary | ICD-10-CM | POA: Insufficient documentation

## 2020-08-13 DIAGNOSIS — Z01812 Encounter for preprocedural laboratory examination: Secondary | ICD-10-CM | POA: Insufficient documentation

## 2020-08-13 LAB — SARS CORONAVIRUS 2 (TAT 6-24 HRS): SARS Coronavirus 2: NEGATIVE

## 2020-08-13 NOTE — Telephone Encounter (Signed)
Close encounter 

## 2020-08-14 ENCOUNTER — Other Ambulatory Visit: Payer: Self-pay

## 2020-08-14 ENCOUNTER — Ambulatory Visit (HOSPITAL_COMMUNITY)
Admission: RE | Admit: 2020-08-14 | Discharge: 2020-08-14 | Disposition: A | Discharge status: Home / Self Care | Payer: 59 | Attending: Vascular Surgery | Admitting: Vascular Surgery

## 2020-08-14 ENCOUNTER — Encounter (HOSPITAL_COMMUNITY): Payer: Self-pay

## 2020-08-14 DIAGNOSIS — Z539 Procedure and treatment not carried out, unspecified reason: Secondary | ICD-10-CM | POA: Insufficient documentation

## 2020-08-14 LAB — POCT I-STAT, CHEM 8
BUN: 17 mg/dL (ref 6–20)
Calcium, Ion: 1.21 mmol/L (ref 1.15–1.40)
Chloride: 107 mmol/L (ref 98–111)
Creatinine, Ser: 0.9 mg/dL (ref 0.61–1.24)
Glucose, Bld: 141 mg/dL — ABNORMAL HIGH (ref 70–99)
HCT: 40 % (ref 39.0–52.0)
Hemoglobin: 13.6 g/dL (ref 13.0–17.0)
Potassium: 5.2 mmol/L — ABNORMAL HIGH (ref 3.5–5.1)
Sodium: 141 mmol/L (ref 135–145)
TCO2: 28 mmol/L (ref 22–32)

## 2020-08-14 MED ORDER — SODIUM CHLORIDE 0.9 % IV SOLN: INTRAVENOUS | Status: DC

## 2020-08-14 NOTE — Progress Notes (Signed)
Dr Oneida Alar is on a surgical case and pt would like to reschedule.

## 2020-08-18 ENCOUNTER — Ambulatory Visit (HOSPITAL_COMMUNITY)
Admission: RE | Admit: 2020-08-18 | Discharge: 2020-08-18 | Disposition: A | Discharge status: Home / Self Care | Payer: 59 | Source: Ambulatory Visit | Attending: Cardiovascular Disease | Admitting: Cardiovascular Disease

## 2020-08-18 ENCOUNTER — Other Ambulatory Visit: Payer: Self-pay

## 2020-08-18 DIAGNOSIS — R072 Precordial pain: Secondary | ICD-10-CM | POA: Diagnosis not present

## 2020-08-18 LAB — MYOCARDIAL PERFUSION IMAGING
LV dias vol: 144 mL (ref 62–150)
LV sys vol: 80 mL
Peak HR: 102 {beats}/min
Rest HR: 78 {beats}/min
SDS: 3
SRS: 2
SSS: 5
TID: 1.04

## 2020-08-18 MED ORDER — TECHNETIUM TC 99M TETROFOSMIN IV KIT
30.4000 | PACK | Freq: Once | INTRAVENOUS | Status: AC | PRN
  Administered 2020-08-18: 30.4 via INTRAVENOUS
  Filled 2020-08-18: qty 31

## 2020-08-18 MED ORDER — TECHNETIUM TC 99M TETROFOSMIN IV KIT
10.6000 | PACK | Freq: Once | INTRAVENOUS | Status: AC | PRN
  Administered 2020-08-18: 10.6 via INTRAVENOUS
  Filled 2020-08-18: qty 11

## 2020-08-18 MED ORDER — REGADENOSON 0.4 MG/5ML IV SOLN
0.4000 mg | Freq: Once | INTRAVENOUS | Status: AC
  Administered 2020-08-18: 0.4 mg via INTRAVENOUS

## 2020-08-19 ENCOUNTER — Telehealth: Payer: Self-pay | Admitting: Cardiology

## 2020-08-19 ENCOUNTER — Other Ambulatory Visit (HOSPITAL_COMMUNITY)
Admit: 2020-08-19 | Discharge: 2020-08-19 | Disposition: A | Discharge status: Home / Self Care | Payer: 59 | Source: Ambulatory Visit | Attending: Vascular Surgery | Admitting: Vascular Surgery

## 2020-08-19 DIAGNOSIS — Z01812 Encounter for preprocedural laboratory examination: Secondary | ICD-10-CM | POA: Insufficient documentation

## 2020-08-19 DIAGNOSIS — Z20822 Contact with and (suspected) exposure to covid-19: Secondary | ICD-10-CM | POA: Diagnosis not present

## 2020-08-19 LAB — SARS CORONAVIRUS 2 (TAT 6-24 HRS): SARS Coronavirus 2: NEGATIVE

## 2020-08-19 NOTE — Telephone Encounter (Signed)
Patient is calling for results for test done yesterday 08/18/2020. Please call  Thank you!

## 2020-08-19 NOTE — Telephone Encounter (Signed)
Spoke with pt and informed nurse will contact him once MD review results. Pt verbalized understanding.

## 2020-08-21 ENCOUNTER — Ambulatory Visit (HOSPITAL_COMMUNITY)
Admission: RE | Admit: 2020-08-21 | Discharge: 2020-08-21 | Disposition: A | Discharge status: Home / Self Care | Payer: 59 | Attending: Vascular Surgery | Admitting: Vascular Surgery

## 2020-08-21 ENCOUNTER — Encounter (HOSPITAL_COMMUNITY)
Admission: RE | Disposition: A | Discharge status: Home / Self Care | Payer: Self-pay | Source: Home / Self Care | Attending: Vascular Surgery

## 2020-08-21 ENCOUNTER — Other Ambulatory Visit: Payer: Self-pay

## 2020-08-21 DIAGNOSIS — Z8673 Personal history of transient ischemic attack (TIA), and cerebral infarction without residual deficits: Secondary | ICD-10-CM | POA: Insufficient documentation

## 2020-08-21 DIAGNOSIS — Z880 Allergy status to penicillin: Secondary | ICD-10-CM | POA: Diagnosis not present

## 2020-08-21 DIAGNOSIS — Z886 Allergy status to analgesic agent status: Secondary | ICD-10-CM | POA: Diagnosis not present

## 2020-08-21 DIAGNOSIS — Z8249 Family history of ischemic heart disease and other diseases of the circulatory system: Secondary | ICD-10-CM | POA: Insufficient documentation

## 2020-08-21 DIAGNOSIS — I2510 Atherosclerotic heart disease of native coronary artery without angina pectoris: Secondary | ICD-10-CM | POA: Diagnosis not present

## 2020-08-21 DIAGNOSIS — E119 Type 2 diabetes mellitus without complications: Secondary | ICD-10-CM | POA: Insufficient documentation

## 2020-08-21 DIAGNOSIS — F17210 Nicotine dependence, cigarettes, uncomplicated: Secondary | ICD-10-CM | POA: Diagnosis not present

## 2020-08-21 DIAGNOSIS — I70213 Atherosclerosis of native arteries of extremities with intermittent claudication, bilateral legs: Secondary | ICD-10-CM | POA: Insufficient documentation

## 2020-08-21 DIAGNOSIS — Z888 Allergy status to other drugs, medicaments and biological substances status: Secondary | ICD-10-CM | POA: Insufficient documentation

## 2020-08-21 DIAGNOSIS — E785 Hyperlipidemia, unspecified: Secondary | ICD-10-CM | POA: Diagnosis not present

## 2020-08-21 DIAGNOSIS — I10 Essential (primary) hypertension: Secondary | ICD-10-CM | POA: Diagnosis not present

## 2020-08-21 HISTORY — PX: ABDOMINAL AORTOGRAM W/LOWER EXTREMITY: CATH118223

## 2020-08-21 HISTORY — PX: PERIPHERAL VASCULAR INTERVENTION: CATH118257

## 2020-08-21 LAB — GLUCOSE, CAPILLARY
Glucose-Capillary: 157 mg/dL — ABNORMAL HIGH (ref 70–99)
Glucose-Capillary: 101 mg/dL — ABNORMAL HIGH (ref 70–99)

## 2020-08-21 LAB — POCT ACTIVATED CLOTTING TIME: Activated Clotting Time: 175 seconds

## 2020-08-21 LAB — POCT I-STAT, CHEM 8
BUN: 24 mg/dL — ABNORMAL HIGH (ref 6–20)
Calcium, Ion: 1.12 mmol/L — ABNORMAL LOW (ref 1.15–1.40)
Chloride: 108 mmol/L (ref 98–111)
Creatinine, Ser: 1 mg/dL (ref 0.61–1.24)
Glucose, Bld: 135 mg/dL — ABNORMAL HIGH (ref 70–99)
HCT: 43 % (ref 39.0–52.0)
Hemoglobin: 14.6 g/dL (ref 13.0–17.0)
Potassium: 5.3 mmol/L — ABNORMAL HIGH (ref 3.5–5.1)
Sodium: 140 mmol/L (ref 135–145)
TCO2: 23 mmol/L (ref 22–32)

## 2020-08-21 SURGERY — ABDOMINAL AORTOGRAM W/LOWER EXTREMITY
Anesthesia: LOCAL | Laterality: Left

## 2020-08-21 MED ORDER — LIDOCAINE HCL (PF) 1 % IJ SOLN
INTRAMUSCULAR | Status: DC | PRN
  Administered 2020-08-21: 12 mL

## 2020-08-21 MED ORDER — CLOPIDOGREL BISULFATE 75 MG PO TABS
75.0000 mg | ORAL_TABLET | Freq: Every day | ORAL | 11 refills | Status: DC
Start: 2020-08-21 — End: 2021-08-31

## 2020-08-21 MED ORDER — FENTANYL CITRATE (PF) 100 MCG/2ML IJ SOLN
INTRAMUSCULAR | Status: DC | PRN
  Administered 2020-08-21 (×3): 25 ug via INTRAVENOUS

## 2020-08-21 MED ORDER — HEPARIN SODIUM (PORCINE) 1000 UNIT/ML IJ SOLN
INTRAMUSCULAR | Status: AC
  Filled 2020-08-21: qty 1

## 2020-08-21 MED ORDER — HYDRALAZINE HCL 20 MG/ML IJ SOLN: 5.0000 mg | INTRAMUSCULAR | Status: DC | PRN

## 2020-08-21 MED ORDER — LABETALOL HCL 5 MG/ML IV SOLN
INTRAVENOUS | Status: AC
  Filled 2020-08-21: qty 4

## 2020-08-21 MED ORDER — MORPHINE SULFATE (PF) 2 MG/ML IV SOLN
INTRAVENOUS | Status: AC
  Filled 2020-08-21: qty 1

## 2020-08-21 MED ORDER — ASPIRIN EC 81 MG PO TBEC: 81.0000 mg | DELAYED_RELEASE_TABLET | Freq: Every day | ORAL | Status: DC

## 2020-08-21 MED ORDER — LIDOCAINE HCL (PF) 1 % IJ SOLN
INTRAMUSCULAR | Status: AC
  Filled 2020-08-21: qty 30

## 2020-08-21 MED ORDER — OXYCODONE HCL 5 MG PO TABS
5.0000 mg | ORAL_TABLET | ORAL | Status: DC | PRN
  Administered 2020-08-21: 10 mg via ORAL

## 2020-08-21 MED ORDER — HEPARIN (PORCINE) IN NACL 2000-0.9 UNIT/L-% IV SOLN
INTRAVENOUS | Status: AC
  Filled 2020-08-21: qty 1000

## 2020-08-21 MED ORDER — IODIXANOL 320 MG/ML IV SOLN
INTRAVENOUS | Status: DC | PRN
  Administered 2020-08-21: 205 mL

## 2020-08-21 MED ORDER — MIDAZOLAM HCL 2 MG/2ML IJ SOLN
INTRAMUSCULAR | Status: AC
  Filled 2020-08-21: qty 2

## 2020-08-21 MED ORDER — LABETALOL HCL 5 MG/ML IV SOLN
10.0000 mg | INTRAVENOUS | Status: DC | PRN
  Administered 2020-08-21: 10 mg via INTRAVENOUS

## 2020-08-21 MED ORDER — FENTANYL CITRATE (PF) 100 MCG/2ML IJ SOLN
INTRAMUSCULAR | Status: AC
  Filled 2020-08-21: qty 2

## 2020-08-21 MED ORDER — MIDAZOLAM HCL 2 MG/2ML IJ SOLN
INTRAMUSCULAR | Status: DC | PRN
  Administered 2020-08-21 (×2): 1 mg via INTRAVENOUS

## 2020-08-21 MED ORDER — HEPARIN (PORCINE) IN NACL 1000-0.9 UT/500ML-% IV SOLN
INTRAVENOUS | Status: DC | PRN
  Administered 2020-08-21 (×2): 500 mL

## 2020-08-21 MED ORDER — HEPARIN SODIUM (PORCINE) 1000 UNIT/ML IJ SOLN
INTRAMUSCULAR | Status: DC | PRN
  Administered 2020-08-21: 3000 [IU] via INTRAVENOUS
  Administered 2020-08-21: 10000 [IU] via INTRAVENOUS

## 2020-08-21 MED ORDER — OXYCODONE HCL 5 MG PO TABS
ORAL_TABLET | ORAL | Status: AC
  Filled 2020-08-21: qty 2

## 2020-08-21 MED ORDER — CLOPIDOGREL BISULFATE 300 MG PO TABS
ORAL_TABLET | ORAL | Status: AC
  Filled 2020-08-21: qty 1

## 2020-08-21 MED ORDER — CLOPIDOGREL BISULFATE 300 MG PO TABS
ORAL_TABLET | ORAL | Status: DC | PRN
  Administered 2020-08-21: 300 mg via ORAL

## 2020-08-21 MED ORDER — HEPARIN (PORCINE) IN NACL 1000-0.9 UT/500ML-% IV SOLN
INTRAVENOUS | Status: AC
  Filled 2020-08-21: qty 1000

## 2020-08-21 MED ORDER — SODIUM CHLORIDE 0.9 % IV SOLN: INTRAVENOUS | Status: DC

## 2020-08-21 MED ORDER — MORPHINE SULFATE (PF) 2 MG/ML IV SOLN
2.0000 mg | INTRAVENOUS | Status: DC | PRN
  Administered 2020-08-21: 2 mg via INTRAVENOUS

## 2020-08-21 MED ORDER — SODIUM CHLORIDE 0.9 % IV SOLN: INTRAVENOUS | Status: AC

## 2020-08-21 SURGICAL SUPPLY — 20 items
BALLN MUSTANG 5X200X135 (BALLOONS) ×3
BALLN MUSTANG 6X120X135 (BALLOONS) ×3
BALLOON MUSTANG 5X200X135 (BALLOONS) IMPLANT
BALLOON MUSTANG 6X120X135 (BALLOONS) IMPLANT
CATH ANGIO 5F PIGTAIL 65CM (CATHETERS) ×1 IMPLANT
CATH CROSS OVER TEMPO 5F (CATHETERS) ×1 IMPLANT
CATH QUICKCROSS .035X135CM (MICROCATHETER) ×1 IMPLANT
GUIDEWIRE ANGLED .035X260CM (WIRE) ×1 IMPLANT
KIT ENCORE 26 ADVANTAGE (KITS) ×1 IMPLANT
KIT PV (KITS) ×3 IMPLANT
SHEATH PINNACLE 5F 10CM (SHEATH) ×1 IMPLANT
SHEATH PINNACLE ST 7F 65CM (SHEATH) ×1 IMPLANT
STENT ELUVIA 6X120X130 (Permanent Stent) ×2 IMPLANT
STENT ELUVIA 6X60X130 (Permanent Stent) ×1 IMPLANT
SYR MEDRAD MARK V 150ML (SYRINGE) ×1 IMPLANT
TRANSDUCER W/STOPCOCK (MISCELLANEOUS) ×3 IMPLANT
TRAY PV CATH (CUSTOM PROCEDURE TRAY) ×3 IMPLANT
WIRE HI TORQ VERSACORE 300 (WIRE) ×1 IMPLANT
WIRE HITORQ VERSACORE ST 145CM (WIRE) ×1 IMPLANT
WIRE VERSACORE LOC 115CM (WIRE) ×1 IMPLANT

## 2020-08-21 NOTE — Discharge Instructions (Signed)
NO METFORMIN/ GLUCOPHAGE FOR 2 DAYS  Femoral Site Care This sheet gives you information about how to care for yourself after your procedure. Your health care provider may also give you more specific instructions. If you have problems or questions, contact your health care provider. What can I expect after the procedure? After the procedure, it is common to have:  Bruising that usually fades within 1-2 weeks.  Tenderness at the site. Follow these instructions at home: Wound care  Follow instructions from your health care provider about how to take care of your insertion site. Make sure you: ? Wash your hands with soap and water before you change your bandage (dressing). If soap and water are not available, use hand sanitizer. ? Change your dressing as told by your health care provider. ? Leave stitches (sutures), skin glue, or adhesive strips in place. These skin closures may need to stay in place for 2 weeks or longer. If adhesive strip edges start to loosen and curl up, you may trim the loose edges. Do not remove adhesive strips completely unless your health care provider tells you to do that.  Do not take baths, swim, or use a hot tub until your health care provider approves.  You may shower 24-48 hours after the procedure or as told by your health care provider. ? Gently wash the site with plain soap and water. ? Pat the area dry with a clean towel. ? Do not rub the site. This may cause bleeding.  Do not apply powder or lotion to the site. Keep the site clean and dry.  Check your femoral site every day for signs of infection. Check for: ? Redness, swelling, or pain. ? Fluid or blood. ? Warmth. ? Pus or a bad smell. Activity  For the first 2-3 days after your procedure, or as long as directed: ? Avoid climbing stairs as much as possible. ? Do not squat.  Do not lift anything that is heavier than 10 lb (4.5 kg), or the limit that you are told, until your health care provider  says that it is safe.  Rest as directed. ? Avoid sitting for a long time without moving. Get up to take short walks every 1-2 hours.  Do not drive for 24 hours if you were given a medicine to help you relax (sedative). General instructions  Take over-the-counter and prescription medicines only as told by your health care provider.  Keep all follow-up visits as told by your health care provider. This is important. Contact a health care provider if you have:  A fever or chills.  You have redness, swelling, or pain around your insertion site. Get help right away if:  The catheter insertion area swells very fast.  You pass out.  You suddenly start to sweat or your skin gets clammy.  The catheter insertion area is bleeding, and the bleeding does not stop when you hold steady pressure on the area.  The area near or just beyond the catheter insertion site becomes pale, cool, tingly, or numb. These symptoms may represent a serious problem that is an emergency. Do not wait to see if the symptoms will go away. Get medical help right away. Call your local emergency services (911 in the [place] Do not drive yourself to the hospital. Summary  After the procedure, it is common to have bruising that usually fades within 1-2 weeks.  Check your femoral site every day for signs of infection.  Do not lift anything that is heavier   than 10 lb (4.5 kg), or the limit that you are told, until your health care provider says that it is safe. This information is not intended to replace advice given to you by your health care provider. Make sure you discuss any questions you have with your health care provider. Document Revised: 10/23/2017 Document Reviewed: 10/23/2017 Elsevier Patient Education  2020 [employer]  

## 2020-08-21 NOTE — Progress Notes (Signed)
Up and walked and tolerated well; right groin stable, no bleeding or hematoma 

## 2020-08-21 NOTE — Interval H&P Note (Signed)
History and Physical Interval Note:  08/21/2020 1:36 PM  Dustin Sampson  has presented today for surgery, with the diagnosis of peripheral vascular disease.  The various methods of treatment have been discussed with the patient and family. After consideration of risks, benefits and other options for treatment, the patient has consented to  Procedure(s): ABDOMINAL AORTOGRAM W/LOWER EXTREMITY (N/A) as a surgical intervention.  The patient's history has been reviewed, patient examined, no change in status, stable for surgery.  I have reviewed the patient's chart and labs.  Questions were answered to the patient's satisfaction.     [name]

## 2020-08-21 NOTE — Op Note (Signed)
Procedure: Abdominal aortogram with bilateral lower extremity runoff, left SFA stent ([place] 6 x 120, 6 x 120, 6 x 60)  Preoperative diagnosis: Claudication  Postoperative diagnosis: Same  Anesthesia: Local with IV sedation  Sedation time: 26 minutes  Operative findings: 1.  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  Diffuse subtotal occlusion right superficial femoral artery  3.  Three-vessel runoff right foot  4.  25% stenosis infrarenal abdominal aorta just above the aortic bifurcation  Operative details: After obtaining informed consent, the patient is taken the [facility] lab.  The patient was placed in supine position angio table.  Both groins were prepped and draped in usual sterile fashion.  Local anesthesia was infiltrated over the right common femoral artery.  Ultrasound was used to identify the right common femoral artery and femoral bifurcation.  Introducer needle was used to cannulate the right common femoral artery and 035 versa core wire advanced to the abdominal aorta under fluoroscopic guidance.  Next a 5 French sheath was placed over the guidewire in the right common femoral artery.  This was thoroughly flushed with heparinized saline.  5 French pigtail catheter was then advanced over the guidewire in the abdominal aorta and abdominal aortogram obtained in AP projection.  Left and right renal arteries are patent.  The infrarenal abdominal aorta is patent.  There is about a 25% stenosis of the aorta just above the aortic bifurcation.  Magnified views were taken to clarify this.  Next pigtail catheter catheter was pulled on this above the aortic bifurcation and bilateral oblique views of the pelvis were obtained which shows the left and right common external and internal iliac arteries are all patent.  The internal iliac arteries are small and diffusely diseased.  At this point bilateral lower extremity  runoff views were performed through the pigtail catheter.  In the right lower extremity the right common femoral profunda femoris artery is patent.  The right superficial femoral artery is diffusely diseased with segments of subtotal occlusion.  Popliteal artery is patent there is three-vessel runoff to the right foot.  At this point we decided to intervene on the left superficial femoral artery lesion.  The patient was given 10,000 units of heparin and the initial ACT was 246 he was given a subsequent 3000 units of heparin with an ACT greater than 260.  A 5 French crossover catheter was used to catheterize the left common iliac artery and an 035 angled Glidewire used to advance the catheter up and over the aortic bifurcation and then swapped out for an 035 versa core wire.  5 French sheath was then swapped out for a 7 [place] 65 cm destination sheath and this was advanced all the way into the origin of the left superficial femoral artery.  I was then able to advance the versa core wire across the entire lesion with minimal resistance.  I then proceeded to lay in a 6 x 120 followed by an overlapping 6 x 120 and finally a 6 x 60 Eluvia drug-eluting stent.  These were all postdilated with a 6 balloon.  Completion angiogram showed wide patency of the left superficial femoral artery with no residual stenosis extending from the origin of the superficial femoral artery all the way down to the adductor hiatus.  Tibial runoff is intact.  There was no dissection.  At this point the sheath was pulled back into the right hemipelvis and the guidewire removed.  Patient  tolerated procedure well and there were no complications.  He was given 300 mg of Plavix on the table.  He will be maintained on Plavix and aspirin.  Operative management: Patient will be scheduled to have a right superficial femoral artery intervention in a few weeks.  He will be maintained on Plavix and aspirin for life.  Patient again was counseled on  smoking cessation.  [name], MD [facility] Office: [phone]

## 2020-08-21 NOTE — Progress Notes (Signed)
Site area: rt groin arterial site Site Prior to Removal:  Level  Pressure Applied For: 35 minutes Manual:   yes Patient Status During Pull:  stable Post Pull Site:  Level 0 Post Pull Instructions Given:  yes Post Pull Pulses Present: rt pt dopplered Dressing Applied:  Gauze and tegaderm Bedrest begins @ 4315 Comments:

## 2020-08-24 ENCOUNTER — Telehealth: Payer: Self-pay

## 2020-08-24 ENCOUNTER — Encounter (HOSPITAL_COMMUNITY): Payer: Self-pay | Admitting: Vascular Surgery

## 2020-08-24 LAB — POCT ACTIVATED CLOTTING TIME
Activated Clotting Time: 246 seconds
Activated Clotting Time: 268 seconds
Activated Clotting Time: 241 seconds

## 2020-08-24 NOTE — Telephone Encounter (Signed)
Patient left VM about release form to work after angio on 10/29 - tried to call back and vm full.

## 2020-08-25 ENCOUNTER — Telehealth: Payer: Self-pay

## 2020-08-25 ENCOUNTER — Other Ambulatory Visit: Payer: Self-pay

## 2020-08-25 NOTE — Telephone Encounter (Signed)
Patient needs release form to return to work. Left at front desk for patient to pick up. Patient aware

## 2020-08-28 ENCOUNTER — Other Ambulatory Visit: Payer: Self-pay

## 2020-08-28 ENCOUNTER — Telehealth: Payer: Self-pay

## 2020-08-28 ENCOUNTER — Telehealth: Payer: Self-pay | Admitting: Cardiology

## 2020-08-28 DIAGNOSIS — R9439 Abnormal result of other cardiovascular function study: Secondary | ICD-10-CM

## 2020-08-28 NOTE — Telephone Encounter (Signed)
Pt called to ask about holding Plavix for 2 procedures. Tried to call 2x, unable to leave VM.

## 2020-08-28 NOTE — Telephone Encounter (Signed)
Spoke with patient about holding Plavix. He had a stent placed on 08/21/20 and needs to be on the Plavix for 6 weeks prior to holding it. He has an aortagram scheduled 11/12 and a neck surgery scheduled 11/19. Explained that the neck surgery would have to be pushed out at least 6 weeks past 10/29 and if he kept the 11/12 appt, 6 weeks after that. Patient verbalized understanding and would like to keep the 11/12 PV procedure. He is contacting the other surgeon's office to r/s and will call us back if anything changes.

## 2020-08-28 NOTE — Telephone Encounter (Signed)
     I went in the pt's chart to see who call ed him this morning. I transferred the call to Dustin Sampson

## 2020-09-03 ENCOUNTER — Other Ambulatory Visit (HOSPITAL_COMMUNITY)
Admission: RE | Admit: 2020-09-03 | Discharge: 2020-09-03 | Disposition: A | Discharge status: Home / Self Care | Payer: 59 | Source: Ambulatory Visit | Attending: Vascular Surgery | Admitting: Vascular Surgery

## 2020-09-04 ENCOUNTER — Encounter (HOSPITAL_COMMUNITY)
Admission: RE | Disposition: A | Discharge status: Home / Self Care | Payer: Self-pay | Source: Home / Self Care | Attending: Vascular Surgery

## 2020-09-04 ENCOUNTER — Ambulatory Visit (HOSPITAL_COMMUNITY): Payer: 59 | Admitting: Anesthesiology

## 2020-09-04 ENCOUNTER — Ambulatory Visit (HOSPITAL_COMMUNITY)
Admission: RE | Admit: 2020-09-04 | Discharge: 2020-09-04 | Disposition: A | Discharge status: Home / Self Care | Payer: 59 | Attending: Vascular Surgery | Admitting: Vascular Surgery

## 2020-09-04 ENCOUNTER — Encounter (HOSPITAL_COMMUNITY): Payer: Self-pay | Admitting: Vascular Surgery

## 2020-09-04 DIAGNOSIS — Z20822 Contact with and (suspected) exposure to covid-19: Secondary | ICD-10-CM | POA: Diagnosis not present

## 2020-09-04 DIAGNOSIS — E1151 Type 2 diabetes mellitus with diabetic peripheral angiopathy without gangrene: Secondary | ICD-10-CM | POA: Diagnosis not present

## 2020-09-04 DIAGNOSIS — Z880 Allergy status to penicillin: Secondary | ICD-10-CM | POA: Insufficient documentation

## 2020-09-04 DIAGNOSIS — E785 Hyperlipidemia, unspecified: Secondary | ICD-10-CM | POA: Diagnosis not present

## 2020-09-04 DIAGNOSIS — I97618 Postprocedural hemorrhage and hematoma of a circulatory system organ or structure following other circulatory system procedure: Secondary | ICD-10-CM

## 2020-09-04 DIAGNOSIS — Z7984 Long term (current) use of oral hypoglycemic drugs: Secondary | ICD-10-CM | POA: Insufficient documentation

## 2020-09-04 DIAGNOSIS — I70211 Atherosclerosis of native arteries of extremities with intermittent claudication, right leg: Secondary | ICD-10-CM

## 2020-09-04 DIAGNOSIS — F17210 Nicotine dependence, cigarettes, uncomplicated: Secondary | ICD-10-CM | POA: Insufficient documentation

## 2020-09-04 DIAGNOSIS — Z886 Allergy status to analgesic agent status: Secondary | ICD-10-CM | POA: Insufficient documentation

## 2020-09-04 DIAGNOSIS — Z79899 Other long term (current) drug therapy: Secondary | ICD-10-CM | POA: Insufficient documentation

## 2020-09-04 DIAGNOSIS — I10 Essential (primary) hypertension: Secondary | ICD-10-CM | POA: Diagnosis not present

## 2020-09-04 HISTORY — PX: FEMORAL ARTERY EXPLORATION: SHX5160

## 2020-09-04 HISTORY — PX: ABDOMINAL AORTOGRAM W/LOWER EXTREMITY: CATH118223

## 2020-09-04 HISTORY — PX: PERIPHERAL VASCULAR INTERVENTION: CATH118257

## 2020-09-04 LAB — POCT I-STAT, CHEM 8
BUN: 19 mg/dL (ref 6–20)
Calcium, Ion: 1.17 mmol/L (ref 1.15–1.40)
Chloride: 106 mmol/L (ref 98–111)
Creatinine, Ser: 1 mg/dL (ref 0.61–1.24)
Glucose, Bld: 143 mg/dL — ABNORMAL HIGH (ref 70–99)
HCT: 36 % — ABNORMAL LOW (ref 39.0–52.0)
Hemoglobin: 12.2 g/dL — ABNORMAL LOW (ref 13.0–17.0)
Potassium: 4.4 mmol/L (ref 3.5–5.1)
Sodium: 142 mmol/L (ref 135–145)
TCO2: 23 mmol/L (ref 22–32)

## 2020-09-04 LAB — POCT I-STAT 7, (LYTES, BLD GAS, ICA,H+H)
Acid-base deficit: 1 mmol/L (ref 0.0–2.0)
Bicarbonate: 24.9 mmol/L (ref 20.0–28.0)
Calcium, Ion: 1.2 mmol/L (ref 1.15–1.40)
HCT: 32 % — ABNORMAL LOW (ref 39.0–52.0)
Hemoglobin: 10.9 g/dL — ABNORMAL LOW (ref 13.0–17.0)
O2 Saturation: 98 %
Patient temperature: 35.3
Potassium: 4.8 mmol/L (ref 3.5–5.1)
Sodium: 141 mmol/L (ref 135–145)
TCO2: 26 mmol/L (ref 22–32)
pCO2 arterial: 43.5 mmHg (ref 32.0–48.0)
pH, Arterial: 7.357 (ref 7.350–7.450)
pO2, Arterial: 109 mmHg — ABNORMAL HIGH (ref 83.0–108.0)

## 2020-09-04 LAB — GLUCOSE, CAPILLARY: Glucose-Capillary: 122 mg/dL — ABNORMAL HIGH (ref 70–99)

## 2020-09-04 LAB — TYPE AND SCREEN
ABO/RH(D): O POS
Antibody Screen: NEGATIVE

## 2020-09-04 LAB — POCT ACTIVATED CLOTTING TIME
Activated Clotting Time: 257 seconds
Activated Clotting Time: 169 seconds
Activated Clotting Time: 136 seconds

## 2020-09-04 LAB — ABO/RH: ABO/RH(D): O POS

## 2020-09-04 LAB — SARS CORONAVIRUS 2 BY RT PCR (HOSPITAL ORDER, PERFORMED IN ~~LOC~~ HOSPITAL LAB): SARS Coronavirus 2: NEGATIVE

## 2020-09-04 SURGERY — EXPLORATION, ARTERY, FEMORAL
Anesthesia: General | Site: Groin | Laterality: Left

## 2020-09-04 SURGERY — ABDOMINAL AORTOGRAM W/LOWER EXTREMITY
Anesthesia: LOCAL

## 2020-09-04 MED ORDER — FENTANYL CITRATE (PF) 250 MCG/5ML IJ SOLN
INTRAMUSCULAR | Status: DC | PRN
  Administered 2020-09-04 (×2): 50 ug via INTRAVENOUS
  Administered 2020-09-04: 25 ug via INTRAVENOUS
  Administered 2020-09-04: 50 ug via INTRAVENOUS

## 2020-09-04 MED ORDER — ROCURONIUM BROMIDE 10 MG/ML (PF) SYRINGE
PREFILLED_SYRINGE | INTRAVENOUS | Status: AC
  Filled 2020-09-04: qty 10

## 2020-09-04 MED ORDER — MIDAZOLAM HCL 2 MG/2ML IJ SOLN
INTRAMUSCULAR | Status: AC
  Filled 2020-09-04: qty 2

## 2020-09-04 MED ORDER — LIDOCAINE 2% (20 MG/ML) 5 ML SYRINGE
INTRAMUSCULAR | Status: DC | PRN
  Administered 2020-09-04: 60 mg via INTRAVENOUS

## 2020-09-04 MED ORDER — SODIUM CHLORIDE 0.9 % IV SOLN: INTRAVENOUS | Status: DC

## 2020-09-04 MED ORDER — SODIUM CHLORIDE 0.9 % IV SOLN: 250.0000 mL | INTRAVENOUS | Status: DC | PRN

## 2020-09-04 MED ORDER — ONDANSETRON HCL 4 MG/2ML IJ SOLN
INTRAMUSCULAR | Status: DC | PRN
  Administered 2020-09-04: 4 mg via INTRAVENOUS

## 2020-09-04 MED ORDER — ONDANSETRON HCL 4 MG/2ML IJ SOLN
INTRAMUSCULAR | Status: AC
  Filled 2020-09-04: qty 2

## 2020-09-04 MED ORDER — FENTANYL CITRATE (PF) 100 MCG/2ML IJ SOLN
INTRAMUSCULAR | Status: AC
  Filled 2020-09-04: qty 2

## 2020-09-04 MED ORDER — HEPARIN SODIUM (PORCINE) 1000 UNIT/ML IJ SOLN
INTRAMUSCULAR | Status: DC | PRN
  Administered 2020-09-04: 7000 [IU] via INTRAVENOUS

## 2020-09-04 MED ORDER — PHENYLEPHRINE HCL-NACL 10-0.9 MG/250ML-% IV SOLN
INTRAVENOUS | Status: DC | PRN
  Administered 2020-09-04: 50 ug/min via INTRAVENOUS

## 2020-09-04 MED ORDER — ACETAMINOPHEN 325 MG PO TABS
650.0000 mg | ORAL_TABLET | ORAL | Status: DC | PRN
  Administered 2020-09-04: 650 mg via ORAL
  Filled 2020-09-04: qty 2

## 2020-09-04 MED ORDER — HEPARIN SODIUM (PORCINE) 1000 UNIT/ML IJ SOLN
INTRAMUSCULAR | Status: DC | PRN
  Administered 2020-09-04: 10000 [IU] via INTRAVENOUS

## 2020-09-04 MED ORDER — SODIUM CHLORIDE 0.9% FLUSH: 3.0000 mL | Freq: Two times a day (BID) | INTRAVENOUS | Status: DC

## 2020-09-04 MED ORDER — ESMOLOL HCL 100 MG/10ML IV SOLN
INTRAVENOUS | Status: DC | PRN
  Administered 2020-09-04: 30 mg via INTRAVENOUS

## 2020-09-04 MED ORDER — HEPARIN (PORCINE) IN NACL 1000-0.9 UT/500ML-% IV SOLN
INTRAVENOUS | Status: AC
  Filled 2020-09-04: qty 1000

## 2020-09-04 MED ORDER — IODIXANOL 320 MG/ML IV SOLN
INTRAVENOUS | Status: DC | PRN
  Administered 2020-09-04: 55 mL via INTRA_ARTERIAL

## 2020-09-04 MED ORDER — ROCURONIUM BROMIDE 10 MG/ML (PF) SYRINGE
PREFILLED_SYRINGE | INTRAVENOUS | Status: DC | PRN
  Administered 2020-09-04: 70 mg via INTRAVENOUS

## 2020-09-04 MED ORDER — HEPARIN SODIUM (PORCINE) 1000 UNIT/ML IJ SOLN
INTRAMUSCULAR | Status: AC
  Filled 2020-09-04: qty 1

## 2020-09-04 MED ORDER — FENTANYL CITRATE (PF) 100 MCG/2ML IJ SOLN
INTRAMUSCULAR | Status: DC | PRN
  Administered 2020-09-04: 50 ug via INTRAVENOUS
  Administered 2020-09-04: 25 ug via INTRAVENOUS

## 2020-09-04 MED ORDER — LABETALOL HCL 5 MG/ML IV SOLN: 10.0000 mg | INTRAVENOUS | Status: DC | PRN

## 2020-09-04 MED ORDER — MIDAZOLAM HCL 2 MG/2ML IJ SOLN
INTRAMUSCULAR | Status: DC | PRN
  Administered 2020-09-04 (×2): 1 mg via INTRAVENOUS

## 2020-09-04 MED ORDER — ASPIRIN EC 81 MG PO TBEC: 81.0000 mg | DELAYED_RELEASE_TABLET | Freq: Every day | ORAL | Status: DC

## 2020-09-04 MED ORDER — FENTANYL CITRATE (PF) 250 MCG/5ML IJ SOLN
INTRAMUSCULAR | Status: AC
  Filled 2020-09-04: qty 5

## 2020-09-04 MED ORDER — PROPOFOL 10 MG/ML IV BOLUS
INTRAVENOUS | Status: DC | PRN
  Administered 2020-09-04: 100 mg via INTRAVENOUS
  Administered 2020-09-04: 30 mg via INTRAVENOUS
  Administered 2020-09-04: 150 mg via INTRAVENOUS

## 2020-09-04 MED ORDER — PROTAMINE SULFATE 10 MG/ML IV SOLN
INTRAVENOUS | Status: DC | PRN
  Administered 2020-09-04: 40 mg via INTRAVENOUS

## 2020-09-04 MED ORDER — MIDAZOLAM HCL 2 MG/2ML IJ SOLN
INTRAMUSCULAR | Status: DC | PRN
  Administered 2020-09-04: 2 mg via INTRAVENOUS

## 2020-09-04 MED ORDER — SODIUM CHLORIDE 0.9 % IV SOLN
INTRAVENOUS | Status: DC | PRN
  Administered 2020-09-04: 500 mL

## 2020-09-04 MED ORDER — SODIUM CHLORIDE 0.9% IV SOLUTION: Freq: Once | INTRAVENOUS | Status: DC

## 2020-09-04 MED ORDER — LIDOCAINE 2% (20 MG/ML) 5 ML SYRINGE
INTRAMUSCULAR | Status: AC
  Filled 2020-09-04: qty 5

## 2020-09-04 MED ORDER — CLOPIDOGREL BISULFATE 75 MG PO TABS: 75.0000 mg | ORAL_TABLET | Freq: Every day | ORAL | Status: DC

## 2020-09-04 MED ORDER — DEXAMETHASONE SODIUM PHOSPHATE 10 MG/ML IJ SOLN
INTRAMUSCULAR | Status: DC | PRN
  Administered 2020-09-04: 5 mg via INTRAVENOUS

## 2020-09-04 MED ORDER — 0.9 % SODIUM CHLORIDE (POUR BTL) OPTIME
TOPICAL | Status: DC | PRN
  Administered 2020-09-04 (×2): 1000 mL

## 2020-09-04 MED ORDER — SODIUM CHLORIDE 0.9% FLUSH: 3.0000 mL | INTRAVENOUS | Status: DC | PRN

## 2020-09-04 MED ORDER — HEPARIN (PORCINE) IN NACL 1000-0.9 UT/500ML-% IV SOLN
INTRAVENOUS | Status: DC | PRN
  Administered 2020-09-04 (×2): 500 mL

## 2020-09-04 MED ORDER — LACTATED RINGERS IV SOLN: INTRAVENOUS | Status: DC | PRN

## 2020-09-04 MED ORDER — SUGAMMADEX SODIUM 200 MG/2ML IV SOLN
INTRAVENOUS | Status: DC | PRN
  Administered 2020-09-04 (×4): 50 mg via INTRAVENOUS

## 2020-09-04 MED ORDER — DEXAMETHASONE SODIUM PHOSPHATE 10 MG/ML IJ SOLN
INTRAMUSCULAR | Status: AC
  Filled 2020-09-04: qty 1

## 2020-09-04 MED ORDER — LIDOCAINE HCL (PF) 1 % IJ SOLN
INTRAMUSCULAR | Status: DC | PRN
  Administered 2020-09-04: 20 mL

## 2020-09-04 MED ORDER — CEFAZOLIN SODIUM-DEXTROSE 1-4 GM/50ML-% IV SOLN
INTRAVENOUS | Status: DC | PRN
  Administered 2020-09-04: 2 g via INTRAVENOUS

## 2020-09-04 MED ORDER — HYDRALAZINE HCL 20 MG/ML IJ SOLN: 5.0000 mg | INTRAMUSCULAR | Status: DC | PRN

## 2020-09-04 MED ORDER — PROTAMINE SULFATE 10 MG/ML IV SOLN
INTRAVENOUS | Status: AC
  Filled 2020-09-04: qty 5

## 2020-09-04 MED ORDER — LIDOCAINE HCL (PF) 1 % IJ SOLN
INTRAMUSCULAR | Status: AC
  Filled 2020-09-04: qty 30

## 2020-09-04 MED ORDER — ONDANSETRON HCL 4 MG/2ML IJ SOLN: 4.0000 mg | Freq: Four times a day (QID) | INTRAMUSCULAR | Status: DC | PRN

## 2020-09-04 SURGICAL SUPPLY — 61 items
ADH SKN CLS APL DERMABOND .7 (GAUZE/BANDAGES/DRESSINGS) ×1
BANDAGE ESMARK 6X9 LF (GAUZE/BANDAGES/DRESSINGS) IMPLANT
BNDG CMPR 9X6 STRL LF SNTH (GAUZE/BANDAGES/DRESSINGS)
BNDG ELASTIC 4X5.8 VLCR STR LF (GAUZE/BANDAGES/DRESSINGS) IMPLANT
BNDG ESMARK 6X9 LF (GAUZE/BANDAGES/DRESSINGS)
CANISTER SUCT 3000ML PPV (MISCELLANEOUS) ×2 IMPLANT
CANNULA VESSEL 3MM 2 BLNT TIP (CANNULA) ×1 IMPLANT
CATH EMB 4FR 80CM (CATHETERS) ×1 IMPLANT
CLIP VESOCCLUDE MED 24/CT (CLIP) ×2 IMPLANT
CLIP VESOCCLUDE MED 6/CT (CLIP) ×2 IMPLANT
CLIP VESOCCLUDE SM WIDE 24/CT (CLIP) ×2 IMPLANT
CLIP VESOCCLUDE SM WIDE 6/CT (CLIP) ×3 IMPLANT
COVER WAND RF STERILE (DRAPES) ×2 IMPLANT
CUFF TOURN SGL QUICK 24 (TOURNIQUET CUFF)
CUFF TOURN SGL QUICK 34 (TOURNIQUET CUFF)
CUFF TOURN SGL QUICK 42 (TOURNIQUET CUFF) IMPLANT
CUFF TRNQT CYL 24X4X16.5-23 (TOURNIQUET CUFF) IMPLANT
CUFF TRNQT CYL 34X4.125X (TOURNIQUET CUFF) IMPLANT
DERMABOND ADVANCED (GAUZE/BANDAGES/DRESSINGS) ×1
DERMABOND ADVANCED .7 DNX12 (GAUZE/BANDAGES/DRESSINGS) IMPLANT
DRAIN CHANNEL 15F RND FF W/TCR (WOUND CARE) IMPLANT
DRAIN PENROSE 3/4X12 (DRAIN) IMPLANT
DRAPE X-RAY CASS 24X20 (DRAPES) IMPLANT
ELECT REM PT RETURN 9FT ADLT (ELECTROSURGICAL) ×2
ELECTRODE REM PT RTRN 9FT ADLT (ELECTROSURGICAL) ×1 IMPLANT
EVACUATOR SILICONE 100CC (DRAIN) IMPLANT
GLOVE BIO SURGEON STRL SZ7.5 (GLOVE) ×2 IMPLANT
GLOVE BIOGEL PI IND STRL 8 (GLOVE) ×1 IMPLANT
GLOVE BIOGEL PI INDICATOR 8 (GLOVE) ×1
GOWN STRL REUS W/ TWL LRG LVL3 (GOWN DISPOSABLE) ×3 IMPLANT
GOWN STRL REUS W/TWL LRG LVL3 (GOWN DISPOSABLE) ×6
KIT BASIN OR (CUSTOM PROCEDURE TRAY) ×2 IMPLANT
KIT TURNOVER KIT B (KITS) ×2 IMPLANT
MARKER GRAFT CORONARY BYPASS (MISCELLANEOUS) IMPLANT
NS IRRIG 1000ML POUR BTL (IV SOLUTION) ×4 IMPLANT
PACK PERIPHERAL VASCULAR (CUSTOM PROCEDURE TRAY) ×2 IMPLANT
PAD ARMBOARD 7.5X6 YLW CONV (MISCELLANEOUS) ×4 IMPLANT
SET COLLECT BLD 21X3/4 12 (NEEDLE) IMPLANT
SPONGE INTESTINAL PEANUT (DISPOSABLE) ×1 IMPLANT
SPONGE SURGIFOAM ABS GEL 100 (HEMOSTASIS) IMPLANT
STAPLER VISISTAT (STAPLE) IMPLANT
STOPCOCK 4 WAY LG BORE MALE ST (IV SETS) ×1 IMPLANT
SUT ETHILON 3 0 PS 1 (SUTURE) IMPLANT
SUT PROLENE 5 0 C 1 24 (SUTURE) ×3 IMPLANT
SUT PROLENE 6 0 BV (SUTURE) ×3 IMPLANT
SUT PROLENE 7 0 BV 1 (SUTURE) IMPLANT
SUT SILK 2 0 PERMA HAND 18 BK (SUTURE) ×2 IMPLANT
SUT SILK 3 0 (SUTURE)
SUT SILK 3-0 18XBRD TIE 12 (SUTURE) IMPLANT
SUT VIC AB 2-0 CTB1 (SUTURE) ×4 IMPLANT
SUT VIC AB 3-0 SH 27 (SUTURE) ×8
SUT VIC AB 3-0 SH 27X BRD (SUTURE) ×2 IMPLANT
SUT VIC AB 4-0 PS2 27 (SUTURE) ×1 IMPLANT
SUT VICRYL 4-0 PS2 18IN ABS (SUTURE) ×4 IMPLANT
SYR 3ML LL SCALE MARK (SYRINGE) ×1 IMPLANT
SYR BULB IRRIG 60ML STRL (SYRINGE) ×1 IMPLANT
TOWEL GREEN STERILE (TOWEL DISPOSABLE) ×2 IMPLANT
TRAY FOLEY MTR SLVR 16FR STAT (SET/KITS/TRAYS/PACK) ×2 IMPLANT
TUBING EXTENTION W/L.L. (IV SETS) IMPLANT
UNDERPAD 30X36 HEAVY ABSORB (UNDERPADS AND DIAPERS) ×2 IMPLANT
WATER STERILE IRR 1000ML POUR (IV SOLUTION) ×2 IMPLANT

## 2020-09-04 SURGICAL SUPPLY — 18 items
BALLN MUSTANG 4X100X135 (BALLOONS) ×3
BALLN MUSTANG 6X120X135 (BALLOONS) ×3
BALLOON MUSTANG 4X100X135 (BALLOONS) IMPLANT
BALLOON MUSTANG 6X120X135 (BALLOONS) IMPLANT
CATH ANGIO 5F PIGTAIL 65CM (CATHETERS) ×1 IMPLANT
CATH CROSS OVER TEMPO 5F (CATHETERS) ×1 IMPLANT
CATH QUICKCROSS .035X135CM (MICROCATHETER) ×1 IMPLANT
GUIDEWIRE ANGLED .035X260CM (WIRE) ×1 IMPLANT
KIT ENCORE 26 ADVANTAGE (KITS) ×1 IMPLANT
KIT PV (KITS) ×3 IMPLANT
SHEATH PINNACLE MP 7F 45CM (SHEATH) ×1 IMPLANT
SHEATH PROBE COVER 6X72 (BAG) ×1 IMPLANT
STENT ELUVIA 6X120X130 (Permanent Stent) ×2 IMPLANT
STENT ELUVIA 6X60X130 (Permanent Stent) ×1 IMPLANT
TRANSDUCER W/STOPCOCK (MISCELLANEOUS) ×3 IMPLANT
TRAY PV CATH (CUSTOM PROCEDURE TRAY) ×3 IMPLANT
WIRE HITORQ VERSACORE ST 145CM (WIRE) ×1 IMPLANT
WIRE ROSEN-J .035X260CM (WIRE) ×1 IMPLANT

## 2020-09-04 NOTE — Progress Notes (Signed)
Patient with ongoing oozing from left groin despite pressure for approximately 45 minutes.  He was having ongoing oozing around the sheath during the case.  I believe this point he probably needs exploration of the left groin repair of his left common femoral artery.  I discussed this with the patient and his wife including risk benefits possible complications and procedure details.  My partner Dr. Scot Dock is available for the procedure and will proceed as soon as an operating room is available.  Patient has been hemodynamically stable.  [name], MD [facility] Office: [phone]

## 2020-09-04 NOTE — Anesthesia Procedure Notes (Signed)
Arterial Line Insertion Start/End11/09/2020 10:27 AM, 09/04/2020 10:31 AM Performed by: Audry Pili, MD, anesthesiologist  Patient location: Pre-op. Preanesthetic checklist: patient identified, IV checked, risks and benefits discussed, surgical consent, monitors and equipment checked, pre-op evaluation, timeout performed and anesthesia consent Lidocaine 1% used for infiltration and patient sedated radial was placed Catheter size: 20 G Hand hygiene performed   Attempts: 1 Procedure performed using ultrasound guided technique. Ultrasound Notes:anatomy identified, needle tip was noted to be adjacent to the nerve/plexus identified, no ultrasound evidence of intravascular and/or intraneural injection and image(s) printed for medical record Following insertion, dressing applied and Biopatch. Post procedure assessment: unchanged and normal  Patient tolerated the procedure well with no immediate complications.

## 2020-09-04 NOTE — Anesthesia Postprocedure Evaluation (Signed)
Anesthesia Post Note  Patient: Mishon Blubaugh  Procedure(s) Performed: FEMORAL ARTERY EXPLORATION (Left Groin)     Patient location during evaluation: PACU Anesthesia Type: General Level of consciousness: awake and alert Pain management: pain level controlled Vital Signs Assessment: post-procedure vital signs reviewed and stable Respiratory status: spontaneous breathing, nonlabored ventilation, respiratory function stable and patient connected to nasal cannula oxygen Cardiovascular status: blood pressure returned to baseline and stable Postop Assessment: no apparent nausea or vomiting Anesthetic complications: no   No complications documented.  Last Vitals:  Vitals:   09/04/20 1145 09/04/20 1200  BP: 118/69 112/74  Pulse: 83 85  Resp: 10 (!) 9  Temp:    SpO2: 99% 100%    Last Pain:  Vitals:   09/04/20 1200  TempSrc:   PainSc: 0-No pain                 Jillian Warth

## 2020-09-04 NOTE — H&P (Signed)
Referring Physician: Anice Paganini PA  Patient name: Dustin Sampson      MRN: [id number]        DOB: 1965-10-28        Sex: male  REASON FOR CONSULT: Bilateral claudication  HPI: Dustin Sampson is a 54 y.o. male, with approximately 5-year history of cramping and tightness in both calves when walking.  This currently occurs about 100 yards.  He states that it is slowly getting worse over the last 5 years.  He does not have rest pain in the feet.  He does not have nonhealing wounds.  He currently smokes about half pack of cigarettes per day.  He is considering quitting.  He also has some dizziness and balance issues.  He has recently been evaluated by Dr. Tomi Likens with neurology.  He had an MRI of the brain today which showed a chronic stroke in the right cerebellar and left caudate areas.  He is not currently on aspirin.  He is on a statin.  He also has a recent diagnosis of monoclonal gammopathy.  This is currently being observed.  He has not had significant decline in his serum creatinine.  Most recent serum creatinine that I was able to find was 1.0 on April 2021.  He also has a history of erectile dysfunction and has previously been evaluated by urology.  We did discuss today that there is certainly probably a component related to atherosclerosis as well as diabetes.  He states that his diabetes has been well controlled.  He is currently on Metformin.  He is also requesting today to have a cardiac evaluation due to all of these other recent problems.  He does not currently have chest pain.  He does have some occasional shortness of breath.  He also occasionally has wheezing.  Other medical problems include hypertension hyperlipidemia both of which are currently controlled.      Past Medical History:  Diagnosis Date  . Diabetes mellitus without complication ([place])   . Hyperlipemia   . Hypertension   . Insomnia   . Thyroid disease         Past Surgical History:  Procedure Laterality Date  .  COLONOSCOPY     x2  . NASAL SINUS SURGERY    . WISDOM TOOTH EXTRACTION    . WRIST SURGERY     bilat         Family History  Problem Relation Age of Onset  . Colon cancer Father   . Lung cancer Father   . Atrial fibrillation Mother   . Autoimmune disease Sister   . Healthy Daughter   . Healthy Son     SOCIAL HISTORY: Social History        Socioeconomic History  . Marital status: Married    Spouse name: Not on file  . Number of children: 2  . Years of education: BS  . Highest education level: Bachelor's degree (e.g., BA, AB, BS)  Occupational History  . Occupation: Arts development officer  Tobacco Use  . Smoking status: Current Every Day Smoker    Packs/day: 0.50    Types: Cigarettes  . Smokeless tobacco: Former Systems developer  . Tobacco comment: 1/2 PACK A DAY  Vaping Use  . Vaping Use: Never used  Substance and Sexual Activity  . Alcohol use: Yes    Comment: social  . Drug use: No  . Sexual activity: Not on file  Other Topics Concern  . Not on file  Social History Narrative   Married,  lives with wife and two teenage children. He and his sister alternate with their parents living with them every other month. His parents are in their upper 41's. He drinks 4-5 cups of coffee a day and 2-3 sodas or glasses of tea. He does not get any regular exercise. Right handed   One story home   Social Determinants of Health      Financial Resource Strain:   . Difficulty of Paying Living Expenses: Not on file  Food Insecurity:   . Worried About Charity fundraiser in the Last Year: Not on file  . Ran Out of Food in the Last Year: Not on file  Transportation Needs:   . Lack of Transportation (Medical): Not on file  . Lack of Transportation (Non-Medical): Not on file  Physical Activity:   . Days of Exercise per Week: Not on file  . Minutes of Exercise per Session: Not on file  Stress:   . Feeling of Stress : Not on file  Social Connections:   .  Frequency of Communication with Friends and Family: Not on file  . Frequency of Social Gatherings with Friends and Family: Not on file  . Attends Religious Services: Not on file  . Active Member of Clubs or Organizations: Not on file  . Attends Archivist Meetings: Not on file  . Marital Status: Not on file  Intimate Partner Violence:   . Fear of Current or Ex-Partner: Not on file  . Emotionally Abused: Not on file  . Physically Abused: Not on file  . Sexually Abused: Not on file         Allergies  Allergen Reactions  . 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 Avelox [Moxifloxacin Hcl In Nacl] Nausea And Vomiting    "made me real sick"  . Penicillins     REACTION: severe GI upset          Current Outpatient Medications  Medication Sig Dispense Refill  . atorvastatin (LIPITOR) 10 MG tablet Take 10 mg by mouth daily.    . butalbital-acetaminophen-caffeine (FIORICET) 50-325-40 MG tablet butalbital-acetaminophen-caffeine 50 mg-325 mg-40 mg tablet  TAKE 1 TABLET BY MOUTH EVERY 4 HOURS AS NEEDED 15    . fluorouracil (EFUDEX) 5 % cream Apply topically 2 (two) times daily. 40 g 1  . gabapentin (NEURONTIN) 300 MG capsule TAKE 1 CAPSULE BY MOUTH 3 TIMES A DAY AS NEEDED FOR NEUROPATHY    . levothyroxine (SYNTHROID, LEVOTHROID) 200 MCG tablet Take 200 mcg by mouth daily before breakfast.    . losartan (COZAAR) 50 MG tablet Take 50 mg by mouth daily.    . metFORMIN (GLUCOPHAGE) 500 MG tablet Take by mouth 2 (two) times daily with a meal.    . pseudoephedrine (SUDAFED) 60 MG tablet Take 120 mg by mouth every 4 (four) hours as needed for congestion (q 12 hr/ extended release tab).    . tadalafil (CIALIS) 20 MG tablet tadalafil 20 mg tablet  TAKE 1 TABLET BY MOUTH AS NEEDED PRIOR TO SEXUAL ACTIVITY    . temazepam (RESTORIL) 30 MG capsule Take 30 mg by mouth at bedtime as needed for sleep.    . traMADol (ULTRAM) 50 MG tablet  Take 1 tablet (50 mg total) by mouth every 6 (six) hours as needed. 10 tablet 2  . topiramate (TOPAMAX) 100 MG tablet Take 1 tablet (100 mg total) by mouth at bedtime. (Patient not taking: Reported on 07/30/2020) 30  tablet 3   No current facility-administered medications for this visit.    ROS:   General:  No weight loss, Fever, chills  HEENT: No recent headaches, no nasal bleeding, no visual changes, no sore throat  Neurologic: No dizziness, blackouts, seizures. No recent symptoms of stroke or mini- stroke. No recent episodes of slurred speech, or temporary blindness.  Cardiac: No recent episodes of chest pain/pressure, no shortness of breath at rest.  No shortness of breath with exertion.  Denies history of atrial fibrillation or irregular heartbeat  Vascular: No history of rest pain in feet.  No history of claudication.  No history of non-healing ulcer, No history of DVT   Pulmonary: No home oxygen, no productive cough, no hemoptysis,  No asthma or wheezing  Musculoskeletal:  [ ]  Arthritis, [ ]  Low back pain,  [ ]  Joint pain  Hematologic:No history of hypercoagulable state.  No history of easy bleeding.  No history of anemia  Gastrointestinal: No hematochezia or melena,  No gastroesophageal reflux, no trouble swallowing  Urinary: [ ]  chronic Kidney disease, [ ]  on HD - [ ]  MWF or [ ]  TTHS, [ ]  Burning with urination, [ ]  Frequent urination, [ ]  Difficulty urinating;   Skin: No rashes  Psychological: No history of anxiety,  No history of depression   Physical Examination     Vitals:   07/30/20 0932  BP: 132/83  Pulse: 91  Resp: 20  Temp: 98.3 F (36.8 C)  SpO2: 94%  Weight: 216 lb (98 kg)  Height: 6\' 1"  (1.854 m)    Body mass index is 28.5 kg/m.  General:  Alert and oriented, no acute distress HEENT: Normal Neck: No JVD Cardiac: Regular Rate and Rhythm Abdomen: Soft, non-tender, non-distended, no mass Skin: No rash Extremity Pulses:  2+  radial, brachial, 1+ left 2+ right femoral, absent popliteal dorsalis pedis, posterior tibial pulses bilaterally Musculoskeletal: No deformity or edema      Neurologic: Upper and lower extremity motor 5/5 and symmetric  DATA:  Patient had bilateral ABIs performed today which were 0.6 bilaterally.  I reviewed and interpreted the study.  I also reviewed the patient's MRI of the brain performed earlier today which shows a chronic right cerebellar and left caudate stroke.  ASSESSMENT: Prior left SFA stent 2 weeks ago plan for right leg intervention today  PLAN: see above  [name], MD [facility] Office: [phone]

## 2020-09-04 NOTE — Op Note (Signed)
Procedure: Right lower extremity arteriogram right superficial femoral artery stent (6 x 120, 6 x 60, 6 x 120 Eluvia)  Preoperative diagnosis: Claudication  Postoperative diagnosis: Same  Anesthesia: Local with IV sedation  Sedation time 65 minutes  Contrast: 55 cc  Operative findings: Occlusion right mid to distal superficial femoral artery stented to 0% residual stenosis three-vessel runoff right foot  Operative details: After obtaining form consent, the patient taken to the [facility] lab.  The patient was placed in supine position on the angio table.  Both groins were prepped and draped in usual sterile fashion.  Local anesthesia was infiltrated over the left common femoral artery.  Ultrasound was used to identify the left common femoral artery and femoral bifurcation.  An introducer needle was used to cannulate the left common femoral artery and an 035 versa core wire threaded abdominal aorta under fluoroscopic guidance.  A 7 French destination sheath was then advanced over the guidewire in the left common femoral artery.  This was advanced up to the level aortic bifurcation.  A 5 French crossover catheter was then used to selectively catheterize the right common iliac artery.  I initially tried to advance the versa core wire up and over the aortic bifurcation but this would not pass the iliac bifurcation.  Therefore it was swapped out for an 035 angled Glidewire and I was able to advance this all the way to the mid right superficial femoral artery.  Dilator was then placed back in the sheath and this was advanced up and over the aortic bifurcation and into the proximal right superficial femoral artery.  The patient was given 10,000 units of intravenous heparin.  ACT was confirmed to be greater than 250.  Contrast angiogram was performed to the right lower extremity to determine precise level of the lesion.  I then used an 035 angled Glidewire and an 035 quick cross to cross the occlusion in the right  superficial femoral artery and reentry into the above-knee popliteal artery was confirmed with contrast angiogram.  I then predilated the lesion with a 4 mm x 60 angioplasty balloon to 10 atm for 1 minute.  I then placed a 6 x 120 drug-eluting Eluvia stent and postdilated this with a 6 balloon.  I then placed a 6 x 60 Eluvia and postdilated this followed by a 6 x 120 Eluvia which took [name] up to about 5 cm from the SFA origin.  This was also postdilated all post dilations were performed with a 6 balloon for 1 minute.  Completion angiogram was performed which showed widely patent right superficial femoral artery intact popliteal and three-vessel runoff to the right foot.  Patient was having some intermittent oozing around the sheath during the entire case and had a small hematoma in the left groin.  We checked the ACT was less than 175.  Therefore we pulled the sheath at the conclusion of the procedure in the procedure room.  Hemostasis was obtained with direct pressure.  Patient tired procedure well and there were no complications.  Patient was taken to the holding area in stable condition.  Operative management: Patient now has bilateral superficial femoral artery stents.  He will be maintained on a lifetime of Plavix and aspirin.  We will see him back in follow-up in 3 months time.  [name], MD [facility] Office: [phone]

## 2020-09-04 NOTE — Transfer of Care (Signed)
Immediate Anesthesia Transfer of Care Note  Patient: Dustin Sampson  Procedure(s) Performed: FEMORAL ARTERY EXPLORATION (Left Groin)  Patient Location: PACU  Anesthesia Type:General  Level of Consciousness: awake, alert , oriented and patient cooperative  Airway & Oxygen Therapy: Patient Spontanous Breathing and Patient connected to face mask oxygen  Post-op Assessment: Report given to RN, Post -op Vital signs reviewed and unstable, Anesthesiologist notified and Patient moving all extremities X 4  Post vital signs: Reviewed and stable  Last Vitals:  Vitals Value Taken Time  BP 115/80 09/04/20 1133  Temp    Pulse 87 09/04/20 1139  Resp 12 09/04/20 1139  SpO2 95 % 09/04/20 1139  Vitals shown include unvalidated device data.  Last Pain:  Vitals:   09/04/20 0813  TempSrc:   PainSc: 0-No pain         Complications: No complications documented.

## 2020-09-04 NOTE — Anesthesia Preprocedure Evaluation (Signed)
Anesthesia Evaluation  Patient identified by MRN, date of birth, ID band Patient awake    Reviewed: Allergy & Precautions, H&P , NPO status , Patient's Chart, lab work & pertinent test results, reviewed documented beta blocker date and time   Airway Mallampati: II  TM Distance: >3 FB Neck ROM: full    Dental no notable dental hx. (+) Teeth Intact, Dental Advisory Given   Pulmonary neg pulmonary ROS, Current Smoker,    Pulmonary exam normal breath sounds clear to auscultation       Cardiovascular Exercise Tolerance: Good hypertension, negative cardio ROS   Rhythm:regular Rate:Normal  Myocardial perfusion scan 11/21  There was no ST segment deviation noted during stress. The left ventricular ejection fraction is mildly decreased (45-54%). Nuclear stress EF: 45%. Defect 1: There is a medium defect of moderate severity present in the basal inferoseptal, basal inferior, mid inferoseptal, mid inferior and apical inferior location. Findings consistent with prior myocardial infarction with peri-infarct ischemia.  This is an intermediate risk study. 1. Partially reversible inferior/inferoseptal perfusion defect suggestive of infarct with peri-infarct ischemia 2. Mild systolic dysfunction (EF 49%) 3. Intermediate risk study     Neuro/Psych negative neurological ROS  negative psych ROS   GI/Hepatic negative GI ROS, Neg liver ROS,   Endo/Other  diabetes, Insulin DependentHypothyroidism   Renal/GU negative Renal ROS  negative genitourinary   Musculoskeletal   Abdominal   Peds  Hematology negative hematology ROS (+)   Anesthesia Other Findings   Reproductive/Obstetrics negative OB ROS                             Anesthesia Physical Anesthesia Plan  ASA: III and emergent  Anesthesia Plan: General   Post-op Pain Management:    Induction:   PONV Risk Score and Plan: 2 and Ondansetron  and Treatment may vary due to age or medical condition  Airway Management Planned: Oral ETT and LMA  Additional Equipment:   Intra-op Plan:   Post-operative Plan:   Informed Consent: I have reviewed the patients History and Physical, chart, labs and discussed the procedure including the risks, benefits and alternatives for the proposed anesthesia with the patient or authorized representative who has indicated his/her understanding and acceptance.     Dental Advisory Given  Plan Discussed with: CRNA and Anesthesiologist  Anesthesia Plan Comments:         Anesthesia Quick Evaluation

## 2020-09-04 NOTE — Anesthesia Procedure Notes (Signed)
Procedure Name: Intubation Date/Time: 09/04/2020 10:24 AM Performed by: Rande Brunt, CRNA Pre-anesthesia Checklist: Patient identified, Emergency Drugs available, Suction available and Patient being monitored Patient Re-evaluated:Patient Re-evaluated prior to induction Oxygen Delivery Method: Circle System Utilized and Circle system utilized Preoxygenation: Pre-oxygenation with 100% oxygen Induction Type: IV induction Ventilation: Mask ventilation without difficulty Laryngoscope Size: Mac and 4 Grade View: Grade I Tube type: Oral Tube size: 7.5 mm Number of attempts: 1 Airway Equipment and Method: Stylet Placement Confirmation: ETT inserted through vocal cords under direct vision,  positive ETCO2 and breath sounds checked- equal and bilateral Secured at: 23 cm Tube secured with: Tape Dental Injury: Teeth and Oropharynx as per pre-operative assessment

## 2020-09-04 NOTE — Op Note (Signed)
    NAME: Dustin Sampson    MRN: [id number] DOB: 08-25-1966    DATE OF OPERATION: 09/04/2020  PREOP DIAGNOSIS:    Bleeding status post arteriogram  POSTOP DIAGNOSIS:    Same  PROCEDURE:    Repair of left common femoral artery  SURGEON: Judeth Cornfield. Scot Dock, MD  ASSIST: Marjean Donna, MD  ANESTHESIA: General  EBL: 100 cc  INDICATIONS:    Dustin Sampson is a 54 y.o. male who underwent an arteriogram earlier today via a left femoral approach.  He had a 7 French sheath in the left groin was having some oozing around the sheath.  After the sheath was pulled he continued to have bleeding from this area and therefore it was felt that the safest approach was to explore this in the operating room.  FINDINGS:   There was a single puncture in the anterior lateral wall of the left common femoral artery.  The artery was somewhat thin.  This was repaired with a 5-0 Prolene suture.  TECHNIQUE:   The patient was taken to the operating room with pressure being held on the left groin.  He received a general anesthetic.  The entire left lower extremity and groin were prepped and draped in the usual sterile fashion.  Pressure was removed and initially the bleeding was under control.  I then made a longitudinal incision in the groin the dissection was carried down to the common femoral artery.  Once we had gotten close enough to the artery bleeding resumed and this was controlled with digital pressure.  I was unable to expose the common femoral artery above the bleeding and get a vessel loop around this for proximal control.  He had a stent in the SFA distally but once I dissected out this area it was clear that the cannulation site was in the common femoral artery.  The patient was heparinized.  I clamped the common femoral artery proximal to the bleeding and then could see the area that was bleeding.  A 5-0 Prolene suture was placed.  There was still's some oozing and the artery was somewhat thin and I therefore  took a small vein pledget and placed a pledgeted 5-0 Prolene and this controlled the bleeding.  The wound was irrigated with copious amount of saline.  The wound was then closed with a deep layer of 2-0 Vicryl, the subcutaneous layer with 3-0 Vicryl and the skin closed with 4-0 Vicryl.  Dermabond was applied.  The patient tolerated the procedure well was transferred to recovery room in stable condition.  All needle and sponge counts were correct.  Given the complexity of the case a first assistant was necessary in order to expedient the procedure and safely perform the technical aspects of the operation.  Deitra Mayo, MD, FACS [facility]  DATE OF DICTATION:   09/04/2020

## 2020-09-04 NOTE — Discharge Instructions (Signed)
Femoral Site Care This sheet gives you information about how to care for yourself after your procedure. Your health care provider may also give you more specific instructions. If you have problems or questions, contact your health care provider. What can I expect after the procedure? After the procedure, it is common to have:  Bruising that usually fades within 1-2 weeks.  Tenderness at the site. Follow these instructions at home: Wound care  Follow instructions from your health care provider about how to take care of your insertion site. Make sure you: ? Wash your hands with soap and water before you change your bandage (dressing). If soap and water are not available, use hand sanitizer. ? Change your dressing as told by your health care provider. ? Leave stitches (sutures), skin glue, or adhesive strips in place. These skin closures may need to stay in place for 2 weeks or longer. If adhesive strip edges start to loosen and curl up, you may trim the loose edges. Do not remove adhesive strips completely unless your health care provider tells you to do that.  Do not take baths, swim, or use a hot tub until your health care provider approves.  You may shower 24-48 hours after the procedure or as told by your health care provider. ? Gently wash the site with plain soap and water. ? Pat the area dry with a clean towel. ? Do not rub the site. This may cause bleeding.  Do not apply powder or lotion to the site. Keep the site clean and dry.  Check your femoral site every day for signs of infection. Check for: ? Redness, swelling, or pain. ? Fluid or blood. ? Warmth. ? Pus or a bad smell. Activity  For the first 2-3 days after your procedure, or as long as directed: ? Avoid climbing stairs as much as possible. ? Do not squat.  Do not lift anything that is heavier than 10 lb (4.5 kg), or the limit that you are told, until your health care provider says that it is safe.  Rest as  directed. ? Avoid sitting for a long time without moving. Get up to take short walks every 1-2 hours.  Do not drive for 24 hours if you were given a medicine to help you relax (sedative). General instructions  Take over-the-counter and prescription medicines only as told by your health care provider.  Keep all follow-up visits as told by your health care provider. This is important. Contact a health care provider if you have:  A fever or chills.  You have redness, swelling, or pain around your insertion site. Get help right away if:  The catheter insertion area swells very fast.  You pass out.  You suddenly start to sweat or your skin gets clammy.  The catheter insertion area is bleeding, and the bleeding does not stop when you hold steady pressure on the area.  The area near or just beyond the catheter insertion site becomes pale, cool, tingly, or numb. These symptoms may represent a serious problem that is an emergency. Do not wait to see if the symptoms will go away. Get medical help right away. Call your local emergency services (911 in the [place] Do not drive yourself to the hospital. Summary  After the procedure, it is common to have bruising that usually fades within 1-2 weeks.  Check your femoral site every day for signs of infection.  Do not lift anything that is heavier than 10 lb (4.5 kg), or the   limit that you are told, until your health care provider says that it is safe. This information is not intended to replace advice given to you by your health care provider. Make sure you discuss any questions you have with your health care provider. Document Revised: 10/23/2017 Document Reviewed: 10/23/2017 Elsevier Patient Education  2020 [employer]  

## 2020-09-04 NOTE — Progress Notes (Signed)
Patient seen and examined in the recovery room.  He has palpable dorsalis pedis pulses bilaterally.  No left groin hematoma.  He has some baseline ecchymosis in the right thigh from recent procedure.  I spoke with Dr. Doren Custard who felt the patient would be okay for discharge.  We will observe him in the PACU for a couple of hours.  If he has no complications or hematoma reaccumulation by 3 PM today we will discharge him to home.  [name], MD [facility] [place] Office: [phone]

## 2020-09-05 ENCOUNTER — Encounter (HOSPITAL_COMMUNITY): Payer: Self-pay | Admitting: Vascular Surgery

## 2020-09-05 ENCOUNTER — Telehealth: Payer: Self-pay | Admitting: Vascular Surgery

## 2020-09-05 MED ORDER — OXYCODONE-ACETAMINOPHEN 5-325 MG PO TABS
1.0000 | ORAL_TABLET | ORAL | 0 refills | Status: DC | PRN
Start: 2020-09-05 — End: 2021-01-19

## 2020-09-05 NOTE — Telephone Encounter (Signed)
Patient had repair of left common femoral artery yesterday.  He called me last evening having increasing pain.  H his pain is not currently controlled with over-the-counter anti-inflammatories.  Patient was given a prescription today for Percocet 5/325 #12 dispensed.  [name], MD [facility] [place] Office: [phone]

## 2020-09-07 ENCOUNTER — Telehealth: Payer: Self-pay

## 2020-09-07 NOTE — Telephone Encounter (Signed)
Patient called with concerns about post op bruising and pain. Concerned it hurts more than previous angio. Explained that he was bleeding after the procedure and manual pressure was applied for 45 minutes and then he was taken to OR for groin exploration. More pain and bruising is to be expected this time around. Says the swelling has not gotten worse and denies back pain and hematoma formation. Says there is bruising high on his hip and near testicles. Instructed him to call back if pain became unbearable, there was an increase in the swelling or if leg did not continue to improve. Patient also asked about when he could return to work. Advised to keep f/u appt on 12/2. Patient verbalized understanding.

## 2020-09-10 ENCOUNTER — Encounter (HOSPITAL_COMMUNITY): Payer: 59

## 2020-09-10 ENCOUNTER — Encounter: Payer: 59 | Admitting: Vascular Surgery

## 2020-09-11 ENCOUNTER — Telehealth: Payer: Self-pay

## 2020-09-11 NOTE — Telephone Encounter (Signed)
Patient left VM about bruising on surgical site. Attempted to call back, unable to leave VM.

## 2020-09-16 ENCOUNTER — Ambulatory Visit: Payer: 59 | Admitting: Neurology

## 2020-09-21 ENCOUNTER — Inpatient Hospital Stay (HOSPITAL_COMMUNITY): Admission: RE | Admit: 2020-09-21 | Payer: 59 | Source: Ambulatory Visit

## 2020-09-21 ENCOUNTER — Ambulatory Visit (HOSPITAL_COMMUNITY): Payer: 59 | Attending: Cardiology

## 2020-09-21 ENCOUNTER — Encounter (HOSPITAL_COMMUNITY): Payer: 59

## 2020-09-21 ENCOUNTER — Other Ambulatory Visit (HOSPITAL_COMMUNITY): Payer: 59

## 2020-09-21 ENCOUNTER — Other Ambulatory Visit: Payer: Self-pay

## 2020-09-21 DIAGNOSIS — R9439 Abnormal result of other cardiovascular function study: Secondary | ICD-10-CM

## 2020-09-21 LAB — ECHOCARDIOGRAM COMPLETE
Area-P 1/2: 4.68 cm2
S' Lateral: 4.2 cm

## 2020-09-21 MED ORDER — PERFLUTREN LIPID MICROSPHERE
1.0000 mL | INTRAVENOUS | Status: AC | PRN
  Administered 2020-09-21: 1 mL via INTRAVENOUS

## 2020-09-23 ENCOUNTER — Other Ambulatory Visit: Payer: Self-pay

## 2020-09-23 ENCOUNTER — Ambulatory Visit (INDEPENDENT_AMBULATORY_CARE_PROVIDER_SITE_OTHER): Payer: 59 | Admitting: Cardiology

## 2020-09-23 ENCOUNTER — Encounter: Payer: Self-pay | Admitting: Cardiology

## 2020-09-23 VITALS — BP 142/84 | HR 96 | Ht 73.0 in | Wt 218.4 lb

## 2020-09-23 DIAGNOSIS — Z716 Tobacco abuse counseling: Secondary | ICD-10-CM

## 2020-09-23 DIAGNOSIS — I255 Ischemic cardiomyopathy: Secondary | ICD-10-CM | POA: Diagnosis not present

## 2020-09-23 DIAGNOSIS — I10 Essential (primary) hypertension: Secondary | ICD-10-CM

## 2020-09-23 DIAGNOSIS — R9439 Abnormal result of other cardiovascular function study: Secondary | ICD-10-CM

## 2020-09-23 DIAGNOSIS — E1169 Type 2 diabetes mellitus with other specified complication: Secondary | ICD-10-CM

## 2020-09-23 DIAGNOSIS — Z7189 Other specified counseling: Secondary | ICD-10-CM

## 2020-09-23 DIAGNOSIS — E785 Hyperlipidemia, unspecified: Secondary | ICD-10-CM

## 2020-09-23 DIAGNOSIS — I700 Atherosclerosis of aorta: Secondary | ICD-10-CM

## 2020-09-23 MED ORDER — METOPROLOL SUCCINATE ER 50 MG PO TB24: 50.0000 mg | ORAL_TABLET | Freq: Every day | ORAL | 3 refills | Status: DC

## 2020-09-23 NOTE — Patient Instructions (Addendum)
Medication Instructions:  Start Metoprolol Succinate 50 mg daily  *If you need a refill on your cardiac medications before your next appointment, please call your pharmacy*   Lab Work: None  Testing/Procedures: None   Follow-Up: At [facility], you and your health needs are our priority.  As part of our continuing mission to provide you with exceptional heart care, we have created designated Provider Care Teams.  These Care Teams include your primary Cardiologist (physician) and Advanced Practice Providers (APPs -  Physician Assistants and Nurse Practitioners) who all work together to provide you with the care you need, when you need it.  We recommend signing up for the patient portal called "MyChart".  Sign up information is provided on this After Visit Summary.  MyChart is used to connect with patients for Virtual Visits (Telemedicine).  Patients are able to view lab/test results, encounter notes, upcoming appointments, etc.  Non-urgent messages can be sent to your provider as well.   To learn more about what you can do with MyChart, go to [email or web address].    Your next appointment:   6-8 week(s)  The format for your next appointment:   In Person  Provider:   Buford Dresser, MD    [place] refers to food and lifestyle choices that are based on the traditions of countries located on the [place]. This way of eating has been shown to help prevent certain conditions and improve outcomes for people who have chronic diseases, like kidney disease and heart disease. What are tips for following this plan? Lifestyle  Cook and eat meals together with your family, when possible.  Drink enough fluid to keep your urine clear or pale yellow.  Be physically active every day. This includes: ? Aerobic exercise like running or swimming. ? Leisure activities like gardening, walking, or housework.  Get 7-8 hours of sleep each  night.  If recommended by your health care provider, drink red wine in moderation. This means 1 glass a day for nonpregnant women and 2 glasses a day for men. A glass of wine equals 5 oz (150 mL). Reading food labels   Check the serving size of packaged foods. For foods such as rice and pasta, the serving size refers to the amount of cooked product, not dry.  Check the total fat in packaged foods. Avoid foods that have saturated fat or trans fats.  Check the ingredients list for added sugars, such as corn syrup. Shopping  At the grocery store, buy most of your food from the areas near the walls of the store. This includes: ? Fresh fruits and vegetables (produce). ? Grains, beans, nuts, and seeds. Some of these may be available in unpackaged forms or large amounts (in bulk). ? Fresh seafood. ? Poultry and eggs. ? Low-fat dairy products.  Buy whole ingredients instead of prepackaged foods.  Buy fresh fruits and vegetables in-season from local farmers markets.  Buy frozen fruits and vegetables in resealable bags.  If you do not have access to quality fresh seafood, buy precooked frozen shrimp or canned fish, such as tuna, salmon, or sardines.  Buy small amounts of raw or cooked vegetables, salads, or olives from the deli or salad bar at your store.  Stock your pantry so you always have certain foods on hand, such as olive oil, canned tuna, canned tomatoes, rice, pasta, and beans. Cooking  Cook foods with extra-virgin olive oil instead of using butter or other vegetable oils.  Have meat as a  side dish, and have vegetables or grains as your main dish. This means having meat in small portions or adding small amounts of meat to foods like pasta or stew.  Use beans or vegetables instead of meat in common dishes like chili or lasagna.  Experiment with different cooking methods. Try roasting or broiling vegetables instead of steaming or sauteing them.  Add frozen vegetables to soups,  stews, pasta, or rice.  Add nuts or seeds for added healthy fat at each meal. You can add these to yogurt, salads, or vegetable dishes.  Marinate fish or vegetables using olive oil, lemon juice, garlic, and fresh herbs. Meal planning   Plan to eat 1 vegetarian meal one day each week. Try to work up to 2 vegetarian meals, if possible.  Eat seafood 2 or more times a week.  Have healthy snacks readily available, such as: ? Vegetable sticks with hummus. ? [place] yogurt. ? Fruit and nut trail mix.  Eat balanced meals throughout the week. This includes: ? Fruit: 2-3 servings a day ? Vegetables: 4-5 servings a day ? Low-fat dairy: 2 servings a day ? Fish, poultry, or lean meat: 1 serving a day ? Beans and legumes: 2 or more servings a week ? Nuts and seeds: 1-2 servings a day ? Whole grains: 6-8 servings a day ? Extra-virgin olive oil: 3-4 servings a day  Limit red meat and sweets to only a few servings a month What are my food choices?  Mediterranean diet ? Recommended  Grains: Whole-grain pasta. Brown rice. Bulgar wheat. Polenta. Couscous. Whole-wheat bread. [place].  Vegetables: Artichokes. Beets. Broccoli. Cabbage. Carrots. Eggplant. Green beans. Chard. Kale. Spinach. Onions. Leeks. Peas. Squash. Tomatoes. Peppers. Radishes.  Fruits: Apples. Apricots. Avocado. Berries. Bananas. Cherries. Dates. Figs. Grapes. Lemons. Melon. Oranges. Peaches. Plums. Pomegranate.  Meats and other protein foods: Beans. Almonds. Sunflower seeds. Pine nuts. Peanuts. [place]. Salmon. Scallops. Shrimp. Daniel. Tilapia. Clams. Oysters. Eggs.  Dairy: Low-fat milk. Cheese. Greek yogurt.  Beverages: Water. Red wine. Herbal tea.  Fats and oils: Extra virgin olive oil. Avocado oil. Grape seed oil.  Sweets and desserts: [place] yogurt with honey. Baked apples. Poached pears. Trail mix.  Seasoning and other foods: Basil. Cilantro. Coriander. Cumin. Mint. Parsley. Sage. Rosemary. Tarragon. Garlic.  Oregano. Thyme. Pepper. Balsalmic vinegar. Tahini. Hummus. Tomato sauce. Olives. Mushrooms. ? Limit these  Grains: Prepackaged pasta or rice dishes. Prepackaged cereal with added sugar.  Vegetables: Deep fried potatoes (french fries).  Fruits: Fruit canned in syrup.  Meats and other protein foods: Beef. Pork. Lamb. Poultry with skin. Hot dogs. Berniece Salines.  Dairy: Ice cream. Sour cream. Whole milk.  Beverages: Juice. Sugar-sweetened soft drinks. Beer. Liquor and spirits.  Fats and oils: Butter. Canola oil. Vegetable oil. Beef fat (tallow). Lard.  Sweets and desserts: Cookies. Cakes. Pies. Candy.  Seasoning and other foods: Mayonnaise. Premade sauces and marinades. The items listed may not be a complete list. Talk with your dietitian about what dietary choices are right for you. Summary  The Mediterranean diet includes both food and lifestyle choices.  Eat a variety of fresh fruits and vegetables, beans, nuts, seeds, and whole grains.  Limit the amount of red meat and sweets that you eat.  Talk with your health care provider about whether it is safe for you to drink red wine in moderation. This means 1 glass a day for nonpregnant women and 2 glasses a day for men. A glass of wine equals 5 oz (150 mL). This information is not intended  to replace advice given to you by your health care provider. Make sure you discuss any questions you have with your health care provider. Document Revised: 06/09/2016 Document Reviewed: 06/02/2016 Elsevier Patient Education  [place].

## 2020-09-23 NOTE — Progress Notes (Signed)
Cardiology Office Note:    Date:  09/23/2020   ID:  Dustin Sampson, DOB [birth year], MRN [id number]  PCP:  [name], PA-C  Cardiologist:  Buford Dresser, MD  Referring MD: [name], PA-C   CC: follow up  History of Present Illness:    Dustin Sampson is a 54 y.o. male with a hx of hypertension, hyperlipidemia, type II diabetes, hypothyroidism, claudication, currently tobacco use, MGUS who is seen for follow up today. I initially met him 08/11/20 as a new consult at the request of [name], PA-C for the evaluation and management of risk for CAD.  Cardiovascular risk factors: Prior clinical ASCVD: being evaluated for PAD. Concern for prior silent CVA based on MRI Comorbid conditions: Endorses hypertension, hyperlipidemia, diabetes, chronic kidney disease Metabolic syndrome/Obesity: Highest adult weight was 238 lbs, working on weight loss now Tobacco use history: still smoking, working on weaning off. Down from 1 ppd from 1/2 ppd.  Family history: father had 4-5 stents, no heart attack that he knows of. Mother has afib.   Today: Has been paying more attention to his symptoms since we last met. Feels that he gets chest tightness about once/day, notices more in the morning/evening. Not with activity.   We discussed guidelines for medical management as well as tobacco cessation. He is working to quit, uses nicotine gum when he is at work. Down to 5-10 cigarettes/day. Also discussed nitrates and cialis.   Just saw his nephrologist at [facility], Dr. Hollie Salk. Awaiting results, had blood work done. I do not have these results but will request them.   Denies PND, orthopnea, LE edema or unexpected weight gain. No syncope or palpitations.  Past Medical History:  Diagnosis Date  . Diabetes mellitus without complication ([place])   . Hyperlipemia   . Hypertension   . Insomnia   . Thyroid disease     Past Surgical History:  Procedure Laterality Date  . ABDOMINAL  AORTOGRAM W/LOWER EXTREMITY Bilateral 08/21/2020   Procedure: ABDOMINAL AORTOGRAM W/LOWER EXTREMITY;  Surgeon: Elam Dutch, MD;  Location: [facility];  Service: Cardiovascular;  Laterality: Bilateral;  . ABDOMINAL AORTOGRAM W/LOWER EXTREMITY N/A 09/04/2020   Procedure: ABDOMINAL AORTOGRAM W/LOWER EXTREMITY;  Surgeon: Elam Dutch, MD;  Location: [facility];  Service: Cardiovascular;  Laterality: N/A;  . COLONOSCOPY     x2  . FEMORAL ARTERY EXPLORATION Left 09/04/2020   Procedure: FEMORAL ARTERY EXPLORATION;  Surgeon: Angelia Mould, MD;  Location: [facility];  Service: Vascular;  Laterality: Left;  . NASAL SINUS SURGERY    . PERIPHERAL VASCULAR INTERVENTION Left 08/21/2020   Procedure: PERIPHERAL VASCULAR INTERVENTION;  Surgeon: Elam Dutch, MD;  Location: [facility];  Service: Cardiovascular;  Laterality: Left;  sfa stent x 3  . PERIPHERAL VASCULAR INTERVENTION  09/04/2020   Procedure: PERIPHERAL VASCULAR INTERVENTION;  Surgeon: Elam Dutch, MD;  Location: [facility];  Service: Cardiovascular;;  Right SFA  . WISDOM TOOTH EXTRACTION    . WRIST SURGERY     bilat    Current Medications: Current Outpatient Medications on File Prior to Visit  Medication Sig  . acetaminophen (TYLENOL) 500 MG tablet Take 500-1,000 mg by mouth every 6 (six) hours as needed (for pain.).  Marland Kitchen aspirin EC 81 MG tablet Take 1 tablet (81 mg total) by mouth daily. (Patient taking differently: Take 81 mg by mouth every evening. )  . atorvastatin (LIPITOR) 80 MG tablet Take 80 mg by mouth every evening.  Marland Kitchen  clopidogrel (PLAVIX) 75 MG tablet Take 1 tablet (75 mg total) by mouth daily.  . Febuxostat (ULORIC) 80 MG TABS Take 80 mg by mouth every evening.  . gabapentin (NEURONTIN) 300 MG capsule Take 300 mg by mouth in the morning, at noon, and at bedtime.   Marland Kitchen levothyroxine (SYNTHROID, LEVOTHROID) 200 MCG tablet Take 200 mcg by mouth every evening.   Marland Kitchen losartan (COZAAR)  100 MG tablet Take 100 mg by mouth every evening.   . metFORMIN (GLUCOPHAGE) 500 MG tablet Take 500 mg by mouth every evening.   Marland Kitchen oxyCODONE-acetaminophen (PERCOCET/ROXICET) 5-325 MG tablet Take 1 tablet by mouth every 4 (four) hours as needed for severe pain.  . pseudoephedrine (SUDAFED) 120 MG 12 hr tablet Take 120 mg by mouth 2 (two) times daily as needed for congestion.  . tadalafil (CIALIS) 20 MG tablet Take 20 mg by mouth daily as needed for erectile dysfunction.   . temazepam (RESTORIL) 30 MG capsule Take 30 mg by mouth at bedtime.   . topiramate (TOPAMAX) 100 MG tablet Take 1 tablet (100 mg total) by mouth at bedtime.  . traMADol (ULTRAM) 50 MG tablet Take 1 tablet (50 mg total) by mouth every 6 (six) hours as needed. (Patient taking differently: Take 50 mg by mouth every 6 (six) hours as needed (pain.). )   No current facility-administered medications on file prior to visit.     Allergies:   Aspirin, Avelox [moxifloxacin hcl in nacl], and Penicillins   Social History   Tobacco Use  . Smoking status: Current Every Day Smoker    Packs/day: 0.50    Types: Cigarettes  . Smokeless tobacco: Former Systems developer  . Tobacco comment: 1/2 PACK A DAY  Vaping Use  . Vaping Use: Never used  Substance Use Topics  . Alcohol use: Yes    Comment: social  . Drug use: No    Family History: family history includes Atrial fibrillation in his mother; Autoimmune disease in his sister; Colon cancer in his father; Healthy in his daughter and son; Lung cancer in his father.  ROS:   Please see the history of present illness.  Additional pertinent ROS otherwise unremarkable.  EKGs/Labs/Other Studies Reviewed:    The following studies were reviewed today: Echo 09/21/20 1. Left ventricular ejection fraction, by estimation, is 45%. The left  ventricle has mildly decreased function. The left ventricle demonstrates  regional wall motion abnormalities with inferoseptal and inferior  hypokinesis. Left  ventricular diastolic  parameters are consistent with Grade I diastolic dysfunction (impaired  relaxation).  2. Right ventricular systolic function is normal. The right ventricular  size is normal. Tricuspid regurgitation signal is inadequate for assessing  PA pressure.  3. The mitral valve is normal in structure. No evidence of mitral valve  regurgitation. No evidence of mitral stenosis.  4. The aortic valve is tricuspid. Aortic valve regurgitation is not  visualized. No aortic stenosis is present.  5. The inferior vena cava is normal in size with greater than 50%  respiratory variability, suggesting right atrial pressure of 3 mmHg.  Lexiscan myoview 08/18/20  There was no ST segment deviation noted during stress.  The left ventricular ejection fraction is mildly decreased (45-54%).  Nuclear stress EF: 45%.  Defect 1: There is a medium defect of moderate severity present in the basal inferoseptal, basal inferior, mid inferoseptal, mid inferior and apical inferior location.  Findings consistent with prior myocardial infarction with peri-infarct ischemia.  This is an intermediate risk study.   1. Partially  reversible inferior/inferoseptal perfusion defect suggestive of infarct with peri-infarct ischemia 2. Mild systolic dysfunction (EF 69%) 3. Intermediate risk study   ABI 07/30/20 Right: Resting right ankle-brachial index indicates moderate right lower  extremity arterial disease. The right toe-brachial index is abnormal.   Left: Resting left ankle-brachial index indicates moderate left lower  extremity arterial disease. The left toe-brachial index is abnormal.  Carotids 07/31/20 Right Carotid: Velocities in the right ICA are consistent with a 1-39%  stenosis.   Left Carotid: Velocities in the left ICA are consistent with a 40-59%  stenosis.   CT chest WO contrast 06/17/19 personally reviewed, aortic calcium consistent with aortic atherosclerosis, coronaries not well  seen/cannot exclude calcium   EKG:  EKG is personally reviewed.  The ekg ordered 08/11/20 demonstrates NSR at 92 bpm  Recent Labs: 02/18/2020: ALT 21; Platelet Count 295 09/04/2020: BUN 19; Creatinine, Ser 1.00; Hemoglobin 10.9; Potassium 4.8; Sodium 141  Recent Lipid Panel No results found for: CHOL, TRIG, HDL, CHOLHDL, VLDL, LDLCALC, LDLDIRECT  Physical Exam:    VS:  BP (!) 142/84   Pulse 96   Ht '6\' 1"'  (1.854 m)   Wt 218 lb 6.4 oz (99.1 kg)   SpO2 98%   BMI 28.81 kg/m     Wt Readings from Last 3 Encounters:  09/23/20 218 lb 6.4 oz (99.1 kg)  09/04/20 215 lb (97.5 kg)  08/21/20 215 lb (97.5 kg)    GEN: Well nourished, well developed in no acute distress HEENT: Normal, moist mucous membranes NECK: No JVD CARDIAC: regular rhythm, normal S1 and S2, no rubs or gallops. 1/6 systolic murmur. VASCULAR: Radial pulses 2+ bilaterally RESPIRATORY:  Clear to auscultation without rales, wheezing or rhonchi  ABDOMEN: Soft, non-tender, non-distended MUSCULOSKELETAL:  Ambulates independently SKIN: Warm and dry, no edema NEUROLOGIC:  Alert and oriented x 3. No focal neuro deficits noted. PSYCHIATRIC:  Normal affect   ASSESSMENT:    1. Ischemic cardiomyopathy   2. Aortic atherosclerosis ([place])   3. Essential hypertension   4. Type 2 diabetes mellitus with hyperlipidemia ([place])   5. Tobacco abuse counseling   6. Counseling on health promotion and disease prevention   7. Cardiac risk counseling   8. Abnormal stress test    PLAN:    Precordial pain Aortic atherosclerosis Claudication, with moderate PAD in lower extremities by ABI Hypertension Type II diabetes with hyperlipidemia Ongoing tobacco use Echo and myoview consistent with ischemic cardiomyopathy -myoview with infarct with mild peri-infarct ischemia inferior/inferoseptal walls. EF confirmed on echo (45%) with inferoseptal/inferior hypokinesis -aortic atherosclerosis seen on noncontrast CT in 2020 on my review -continue  aspirin 81 mg, atorvastatin 80 mg for ASCVD risk/PAD. Last LDL 68, but TG 310 (unknown if fasting) -continue losartan for diabetes/hypertension -will start metoprolol succinate today given ischemic cardiomyopathy -consider SGLT2i or GLP1RA in the future. Will need to verify renal function prior to SGLT2i -counseled on role of smoking and counseled to quit completely -discussed red flag warning signs that need immediate medical attention  Tobacco abuse counseling:The patient was counseled on tobacco cessation today for 3 minutes.  Counseling included reviewing the risks of smoking tobacco products, how it impacts the patient's current medical diagnoses and different strategies for quitting.  Pharmacotherapy to aid in tobacco cessation was not prescribed today.  Cardiac risk counseling and prevention recommendations: -recommend heart healthy/Mediterranean diet, with whole grains, fruits, vegetable, fish, lean meats, nuts, and olive oil. Limit salt. -recommend moderate walking, 3-5 times/week for 30-50 minutes each session. Aim for at least  150 minutes.week. Goal should be pace of 3 miles/hours, or walking 1.5 miles in 30 minutes -recommend avoidance of tobacco products. Avoid excess alcohol.  Plan for follow up: 6-8 weeks  Buford Dresser, MD, PhD, [facility]    Medication Adjustments/Labs and Tests Ordered: Current medicines are reviewed at length with the patient today.  Concerns regarding medicines are outlined above.  No orders of the defined types were placed in this encounter.  Meds ordered this encounter  Medications  . metoprolol succinate (TOPROL-XL) 50 MG 24 hr tablet    Sig: Take 1 tablet (50 mg total) by mouth daily. Take with or immediately following a meal.    Dispense:  90 tablet    Refill:  3    Patient Instructions  Medication Instructions:  Start Metoprolol Succinate 50 mg daily  *If you need a refill on your cardiac medications before  your next appointment, please call your pharmacy*   Lab Work: None  Testing/Procedures: None   Follow-Up: At [facility], you and your health needs are our priority.  As part of our continuing mission to provide you with exceptional heart care, we have created designated Provider Care Teams.  These Care Teams include your primary Cardiologist (physician) and Advanced Practice Providers (APPs -  Physician Assistants and Nurse Practitioners) who all work together to provide you with the care you need, when you need it.  We recommend signing up for the patient portal called "MyChart".  Sign up information is provided on this After Visit Summary.  MyChart is used to connect with patients for Virtual Visits (Telemedicine).  Patients are able to view lab/test results, encounter notes, upcoming appointments, etc.  Non-urgent messages can be sent to your provider as well.   To learn more about what you can do with MyChart, go to [email or web address].    Your next appointment:   6-8 week(s)  The format for your next appointment:   In Person  Provider:   Buford Dresser, MD    [place] refers to food and lifestyle choices that are based on the traditions of countries located on the [place]. This way of eating has been shown to help prevent certain conditions and improve outcomes for people who have chronic diseases, like kidney disease and heart disease. What are tips for following this plan? Lifestyle  Cook and eat meals together with your family, when possible.  Drink enough fluid to keep your urine clear or pale yellow.  Be physically active every day. This includes: ? Aerobic exercise like running or swimming. ? Leisure activities like gardening, walking, or housework.  Get 7-8 hours of sleep each night.  If recommended by your health care provider, drink red wine in moderation. This means 1 glass a day for nonpregnant women and  2 glasses a day for men. A glass of wine equals 5 oz (150 mL). Reading food labels   Check the serving size of packaged foods. For foods such as rice and pasta, the serving size refers to the amount of cooked product, not dry.  Check the total fat in packaged foods. Avoid foods that have saturated fat or trans fats.  Check the ingredients list for added sugars, such as corn syrup. Shopping  At the grocery store, buy most of your food from the areas near the walls of the store. This includes: ? Fresh fruits and vegetables (produce). ? Grains, beans, nuts, and seeds. Some of these may be available in  unpackaged forms or large amounts (in bulk). ? Fresh seafood. ? Poultry and eggs. ? Low-fat dairy products.  Buy whole ingredients instead of prepackaged foods.  Buy fresh fruits and vegetables in-season from local farmers markets.  Buy frozen fruits and vegetables in resealable bags.  If you do not have access to quality fresh seafood, buy precooked frozen shrimp or canned fish, such as tuna, salmon, or sardines.  Buy small amounts of raw or cooked vegetables, salads, or olives from the deli or salad bar at your store.  Stock your pantry so you always have certain foods on hand, such as olive oil, canned tuna, canned tomatoes, rice, pasta, and beans. Cooking  Cook foods with extra-virgin olive oil instead of using butter or other vegetable oils.  Have meat as a side dish, and have vegetables or grains as your main dish. This means having meat in small portions or adding small amounts of meat to foods like pasta or stew.  Use beans or vegetables instead of meat in common dishes like chili or lasagna.  Experiment with different cooking methods. Try roasting or broiling vegetables instead of steaming or sauteing them.  Add frozen vegetables to soups, stews, pasta, or rice.  Add nuts or seeds for added healthy fat at each meal. You can add these to yogurt, salads, or vegetable  dishes.  Marinate fish or vegetables using olive oil, lemon juice, garlic, and fresh herbs. Meal planning   Plan to eat 1 vegetarian meal one day each week. Try to work up to 2 vegetarian meals, if possible.  Eat seafood 2 or more times a week.  Have healthy snacks readily available, such as: ? Vegetable sticks with hummus. ? [place] yogurt. ? Fruit and nut trail mix.  Eat balanced meals throughout the week. This includes: ? Fruit: 2-3 servings a day ? Vegetables: 4-5 servings a day ? Low-fat dairy: 2 servings a day ? Fish, poultry, or lean meat: 1 serving a day ? Beans and legumes: 2 or more servings a week ? Nuts and seeds: 1-2 servings a day ? Whole grains: 6-8 servings a day ? Extra-virgin olive oil: 3-4 servings a day  Limit red meat and sweets to only a few servings a month What are my food choices?  Mediterranean diet ? Recommended  Grains: Whole-grain pasta. Brown rice. Bulgar wheat. Polenta. Couscous. Whole-wheat bread. [place].  Vegetables: Artichokes. Beets. Broccoli. Cabbage. Carrots. Eggplant. Green beans. Chard. Kale. Spinach. Onions. Leeks. Peas. Squash. Tomatoes. Peppers. Radishes.  Fruits: Apples. Apricots. Avocado. Berries. Bananas. Cherries. Dates. Figs. Grapes. Lemons. Melon. Oranges. Peaches. Plums. Pomegranate.  Meats and other protein foods: Beans. Almonds. Sunflower seeds. Pine nuts. Peanuts. [place]. Salmon. Scallops. Shrimp. [place]. Tilapia. Clams. Oysters. Eggs.  Dairy: Low-fat milk. Cheese. Greek yogurt.  Beverages: Water. Red wine. Herbal tea.  Fats and oils: Extra virgin olive oil. Avocado oil. Grape seed oil.  Sweets and desserts: [place] yogurt with honey. Baked apples. Poached pears. Trail mix.  Seasoning and other foods: Basil. Cilantro. Coriander. Cumin. Mint. Parsley. Sage. Rosemary. Tarragon. Garlic. Oregano. Thyme. Pepper. Balsalmic vinegar. Tahini. Hummus. Tomato sauce. Olives. Mushrooms. ? Limit these  Grains: Prepackaged pasta  or rice dishes. Prepackaged cereal with added sugar.  Vegetables: Deep fried potatoes (french fries).  Fruits: Fruit canned in syrup.  Meats and other protein foods: Beef. Pork. Lamb. Poultry with skin. Hot dogs. Bernie[place] Salines.  Dairy: Ice cream. Sour cream. Whole milk.  Beverages: Juice. Sugar-sweetened soft drinks. Beer. Liquor and spirits.  Fats and oils:  Butter. Canola oil. Vegetable oil. Beef fat (tallow). Lard.  Sweets and desserts: Cookies. Cakes. Pies. Candy.  Seasoning and other foods: Mayonnaise. Premade sauces and marinades. The items listed may not be a complete list. Talk with your dietitian about what dietary choices are right for you. Summary  The Mediterranean diet includes both food and lifestyle choices.  Eat a variety of fresh fruits and vegetables, beans, nuts, seeds, and whole grains.  Limit the amount of red meat and sweets that you eat.  Talk with your health care provider about whether it is safe for you to drink red wine in moderation. This means 1 glass a day for nonpregnant women and 2 glasses a day for men. A glass of wine equals 5 oz (150 mL). This information is not intended to replace advice given to you by your health care provider. Make sure you discuss any questions you have with your health care provider. Document Revised: 06/09/2016 Document Reviewed: 06/02/2016 Elsevier Patient Education  2020 [employer].    Signed, Buford Dresser, MD PhD 09/23/2020    [place]

## 2020-09-24 ENCOUNTER — Encounter: Payer: Self-pay | Admitting: Vascular Surgery

## 2020-09-24 ENCOUNTER — Ambulatory Visit (INDEPENDENT_AMBULATORY_CARE_PROVIDER_SITE_OTHER): Payer: Self-pay | Admitting: Vascular Surgery

## 2020-09-24 VITALS — BP 144/89 | HR 90 | Temp 98.6°F | Resp 20 | Ht 73.0 in | Wt 219.0 lb

## 2020-09-24 DIAGNOSIS — I739 Peripheral vascular disease, unspecified: Secondary | ICD-10-CM

## 2020-09-24 NOTE — Progress Notes (Signed)
Patient is a 54 year old male who returns for follow-up today.  He underwent stenting of his left superficial femoral artery on August 21, 2020.  This was followed by stenting of his right superficial femoral artery on November 12.  This was complicated by hematoma in the left groin that required primary repair.  He remains on Plavix aspirin and statin.  He thinks his claudication symptoms have resolved.  He does have what sounds like some neuropathy symptoms as not knowing in having difficulty with perception of where his foot is in space.  He does not really describe any numbness or tingling in his feet.  He has no incisional drainage from his left groin.  He is still trying to quit smoking.  Physical exam:  Vitals:   09/24/20 0817  BP: (!) 144/89  Pulse: 90  Resp: 20  Temp: 98.6 F (37 C)  SpO2: 96%  Weight: 219 lb (99.3 kg)  Height: 6\' 1"  (1.854 m)    Extremities: Well-healed left groin incision, 2+ dorsalis pedis pulses bilaterally, absent right posterior tibial pulse 2+ left posterior tibial pulse  Assessment: Doing well status post bilateral superficial femoral artery stenting.  Plan: Patient will follow up with bilateral lower extremity duplex exam and ABIs in 3 months time in our APP clinic.  He will try to continue to quit smoking.  He will continue his Plavix aspirin and statin for life.  [name], MD [facility] Office: [phone]

## 2020-09-28 ENCOUNTER — Other Ambulatory Visit: Payer: Self-pay

## 2020-09-28 DIAGNOSIS — I739 Peripheral vascular disease, unspecified: Secondary | ICD-10-CM

## 2020-11-25 ENCOUNTER — Ambulatory Visit (INDEPENDENT_AMBULATORY_CARE_PROVIDER_SITE_OTHER): Payer: 59 | Admitting: Cardiology

## 2020-11-25 ENCOUNTER — Other Ambulatory Visit: Payer: Self-pay

## 2020-11-25 ENCOUNTER — Encounter: Payer: Self-pay | Admitting: Cardiology

## 2020-11-25 VITALS — BP 146/88 | HR 85 | Ht 73.0 in | Wt 220.4 lb

## 2020-11-25 DIAGNOSIS — E1169 Type 2 diabetes mellitus with other specified complication: Secondary | ICD-10-CM | POA: Diagnosis not present

## 2020-11-25 DIAGNOSIS — I255 Ischemic cardiomyopathy: Secondary | ICD-10-CM | POA: Diagnosis not present

## 2020-11-25 DIAGNOSIS — I700 Atherosclerosis of aorta: Secondary | ICD-10-CM

## 2020-11-25 DIAGNOSIS — I10 Essential (primary) hypertension: Secondary | ICD-10-CM

## 2020-11-25 DIAGNOSIS — Z7189 Other specified counseling: Secondary | ICD-10-CM

## 2020-11-25 DIAGNOSIS — R9439 Abnormal result of other cardiovascular function study: Secondary | ICD-10-CM

## 2020-11-25 DIAGNOSIS — Z716 Tobacco abuse counseling: Secondary | ICD-10-CM

## 2020-11-25 DIAGNOSIS — E785 Hyperlipidemia, unspecified: Secondary | ICD-10-CM

## 2020-11-25 NOTE — Patient Instructions (Signed)

## 2020-11-25 NOTE — Progress Notes (Signed)
Cardiology Office Note:    Date:  11/25/2020   ID:  Dustin Sampson, DOB 01/28/1966, MRN [id number]  PCP:  [name], PA-C  Cardiologist:  Buford Dresser, MD  Referring MD: [name], PA-C   CC: new patient consultation for the evaluation and management of CAD risk  History of Present Illness:    Dustin Sampson is a 55 y.o. male with a hx of hypertension, hyperlipidemia, type II diabetes, hypothyroidism, claudication, PAD, currently tobacco use, CVA by MRI,  MGUS who is seen for follow up today. I initially met him 08/11/20 as a new consult at the request of [name], PA-C for the evaluation and management of risk for CAD.  Cardiovascular risk factors: Prior clinical ASCVD: being evaluated for PAD. Concern for prior silent CVA based on MRI Comorbid conditions: Endorses hypertension, hyperlipidemia, diabetes, chronic kidney disease Metabolic syndrome/Obesity: Highest adult weight was 238 lbs, working on weight loss now Tobacco use history: still smoking, working on weaning off. Peak 1 ppd Family history: father had 4-5 stents, no heart attack that he knows of. Mother has afib.   Today: Reviewed workup to date with stress test, echo.   Lifted weights, no limitations. Hasn't climbed stairs. Walked around the block twice, mild shortness of breath but no chest pain or leg pain. Hasn't been very active recently. Able to catch his breath after several seconds and keep walking. Hasn't felt limited in terms of not being able to do what he wanted to do. Has rare lightheadedness but no syncope.  Hasn't had significant chest pain since starting metoprolol succinate.  We discussed medical management vs cath today. Discussed guideline recommendations. Will continue with medical management. Reviewed change in symptoms/reasons to move forward with cath.  I did receive his most recent note from [facility] dated 09/21/20. BP at that visit 118/74.  Plans to try to increase  his exercise level. Discussed symptoms to watch for. Still smoking but working on quitting.  Discussed nitro, defers for now. Does use cialis on occasion.   Past Medical History:  Diagnosis Date  . Diabetes mellitus without complication ([place])   . Hyperlipemia   . Hypertension   . Insomnia   . Thyroid disease     Past Surgical History:  Procedure Laterality Date  . ABDOMINAL AORTOGRAM W/LOWER EXTREMITY Bilateral 08/21/2020   Procedure: ABDOMINAL AORTOGRAM W/LOWER EXTREMITY;  Surgeon: Elam Dutch, MD;  Location: [facility];  Service: Cardiovascular;  Laterality: Bilateral;  . ABDOMINAL AORTOGRAM W/LOWER EXTREMITY N/A 09/04/2020   Procedure: ABDOMINAL AORTOGRAM W/LOWER EXTREMITY;  Surgeon: Elam Dutch, MD;  Location: [facility];  Service: Cardiovascular;  Laterality: N/A;  . COLONOSCOPY     x2  . FEMORAL ARTERY EXPLORATION Left 09/04/2020   Procedure: FEMORAL ARTERY EXPLORATION;  Surgeon: Angelia Mould, MD;  Location: [facility];  Service: Vascular;  Laterality: Left;  . NASAL SINUS SURGERY    . PERIPHERAL VASCULAR INTERVENTION Left 08/21/2020   Procedure: PERIPHERAL VASCULAR INTERVENTION;  Surgeon: Elam Dutch, MD;  Location: [facility];  Service: Cardiovascular;  Laterality: Left;  sfa stent x 3  . PERIPHERAL VASCULAR INTERVENTION  09/04/2020   Procedure: PERIPHERAL VASCULAR INTERVENTION;  Surgeon: Elam Dutch, MD;  Location: [facility];  Service: Cardiovascular;;  Right SFA  . WISDOM TOOTH EXTRACTION    . WRIST SURGERY     bilat    Current Medications: Current Outpatient Medications on File Prior to Visit  Medication Sig  .  acetaminophen (TYLENOL) 500 MG tablet Take 500-1,000 mg by mouth every 6 (six) hours as needed (for pain.).  Marland Kitchen aspirin EC 81 MG tablet Take 1 tablet (81 mg total) by mouth daily. (Patient taking differently: Take 81 mg by mouth every evening.)  . atorvastatin (LIPITOR) 80 MG tablet Take 80 mg by  mouth every evening.  . clopidogrel (PLAVIX) 75 MG tablet Take 1 tablet (75 mg total) by mouth daily.  . Febuxostat 80 MG TABS Take 80 mg by mouth every evening.  . gabapentin (NEURONTIN) 300 MG capsule Take 300 mg by mouth in the morning, at noon, and at bedtime.   Marland Kitchen levothyroxine (SYNTHROID, LEVOTHROID) 200 MCG tablet Take 200 mcg by mouth every evening.   Marland Kitchen losartan (COZAAR) 100 MG tablet Take 100 mg by mouth every evening.   . metFORMIN (GLUCOPHAGE) 500 MG tablet Take 500 mg by mouth every evening.   . metoprolol succinate (TOPROL-XL) 50 MG 24 hr tablet Take 1 tablet (50 mg total) by mouth daily. Take with or immediately following a meal.  . oxyCODONE-acetaminophen (PERCOCET/ROXICET) 5-325 MG tablet Take 1 tablet by mouth every 4 (four) hours as needed for severe pain.  . pseudoephedrine (SUDAFED) 120 MG 12 hr tablet Take 120 mg by mouth 2 (two) times daily as needed for congestion.  . tadalafil (CIALIS) 20 MG tablet Take 20 mg by mouth daily as needed for erectile dysfunction.   . temazepam (RESTORIL) 30 MG capsule Take 30 mg by mouth at bedtime.  . topiramate (TOPAMAX) 100 MG tablet Take 1 tablet (100 mg total) by mouth at bedtime.  . traMADol (ULTRAM) 50 MG tablet Take 1 tablet (50 mg total) by mouth every 6 (six) hours as needed. (Patient taking differently: Take 50 mg by mouth every 6 (six) hours as needed (pain.).)   No current facility-administered medications on file prior to visit.     Allergies:   Aspirin, Avelox [moxifloxacin hcl in nacl], and Penicillins   Social History   Tobacco Use  . Smoking status: Current Every Day Smoker    Packs/day: 0.50    Types: Cigarettes  . Smokeless tobacco: Former Systems developer  . Tobacco comment: 1/2 PACK A DAY  Vaping Use  . Vaping Use: Never used  Substance Use Topics  . Alcohol use: Yes    Comment: social  . Drug use: No    Family History: family history includes Atrial fibrillation in his mother; Autoimmune disease in his sister; Colon  cancer in his father; Healthy in his daughter and son; Lung cancer in his father.  ROS:   Please see the history of present illness.  Additional pertinent ROS otherwise unremarkable.   EKGs/Labs/Other Studies Reviewed:    The following studies were reviewed today: Echo 09/21/20 1. Left ventricular ejection fraction, by estimation, is 45%. The left  ventricle has mildly decreased function. The left ventricle demonstrates  regional wall motion abnormalities with inferoseptal and inferior  hypokinesis. Left ventricular diastolic  parameters are consistent with Grade I diastolic dysfunction (impaired  relaxation).  2. Right ventricular systolic function is normal. The right ventricular  size is normal. Tricuspid regurgitation signal is inadequate for assessing  PA pressure.  3. The mitral valve is normal in structure. No evidence of mitral valve  regurgitation. No evidence of mitral stenosis.  4. The aortic valve is tricuspid. Aortic valve regurgitation is not  visualized. No aortic stenosis is present.  5. The inferior vena cava is normal in size with greater than 50%  respiratory  variability, suggesting right atrial pressure of 3 mmHg.  Lexiscan myoview 08/18/20  There was no ST segment deviation noted during stress.  The left ventricular ejection fraction is mildly decreased (45-54%).  Nuclear stress EF: 45%.  Defect 1: There is a medium defect of moderate severity present in the basal inferoseptal, basal inferior, mid inferoseptal, mid inferior and apical inferior location.  Findings consistent with prior myocardial infarction with peri-infarct ischemia.  This is an intermediate risk study.   1. Partially reversible inferior/inferoseptal perfusion defect suggestive of infarct with peri-infarct ischemia 2. Mild systolic dysfunction (EF 61%) 3. Intermediate risk study   ABI 07/30/20 Right: Resting right ankle-brachial index indicates moderate right lower  extremity  arterial disease. The right toe-brachial index is abnormal.   Left: Resting left ankle-brachial index indicates moderate left lower  extremity arterial disease. The left toe-brachial index is abnormal.  Carotids 07/31/20 Right Carotid: Velocities in the right ICA are consistent with a 1-39%  stenosis.   Left Carotid: Velocities in the left ICA are consistent with a 40-59%  stenosis.   CT chest WO contrast 06/17/19 personally reviewed, aortic calcium consistent with aortic atherosclerosis, coronaries not well seen/cannot exclude calcium   EKG:  EKG is personally reviewed.  The ekg ordered 08/11/20 demonstrates NSR at 92 bpm  Recent Labs: 02/18/2020: ALT 21; Platelet Count 295 09/04/2020: BUN 19; Creatinine, Ser 1.00; Hemoglobin 10.9; Potassium 4.8; Sodium 141  Recent Lipid Panel No results found for: CHOL, TRIG, HDL, CHOLHDL, VLDL, LDLCALC, LDLDIRECT  Physical Exam:    VS:  BP (!) 146/88   Pulse 85   Ht _0  (1.854 m)   Wt 220 lb 6.4 oz (100 kg)   SpO2 96%   BMI 29.08 kg/m     Wt Readings from Last 3 Encounters:  11/25/20 220 lb 6.4 oz (100 kg)  09/24/20 219 lb (99.3 kg)  09/23/20 218 lb 6.4 oz (99.1 kg)    GEN: Well nourished, well developed in no acute distress HEENT: Normal, moist mucous membranes NECK: No JVD CARDIAC: regular rhythm, normal S1 and S2, no rubs or gallops. 1/6 systolic murmur VASCULAR: Radial pulses 2+ bilaterally. No carotid bruits RESPIRATORY:  Distant with long expiratory phase but no wheezing appreciated ABDOMEN: Soft, non-tender, non-distended MUSCULOSKELETAL:  Ambulates independently SKIN: Warm and dry, no edema NEUROLOGIC:  Alert and oriented x 3. No focal neuro deficits noted. PSYCHIATRIC:  Normal affect   ASSESSMENT:    1. Ischemic cardiomyopathy   2. Aortic atherosclerosis ([place])   3. Essential hypertension   4. Type 2 diabetes mellitus with hyperlipidemia ([place])   5. Tobacco abuse counseling   6. Counseling on health promotion and  disease prevention   7. Abnormal stress test    PLAN:    Aortic atherosclerosis Claudication, with moderate PAD in lower extremities by ABI Prior CVA by MRI Hypertension Type II diabetes with hyperlipidemia Ongoing tobacco use -BP elevated today, but recently well controlled at nephrology visit. Continue to monitor -continue aspirin 81 mg, clopidogrel 75 mg, atorvastatin 80 mg for ASCVD risk/PAD. Last LDL 68, but TG 310 (unknown if fasting) -continue losartan for diabetes/hypertension -consider SGLT2i or GLP1RA in the future -counseled on role of smoking and counseled to quit completely -discussed red flag warning signs that need immediate medical attention  Cardiomyopathy -we reviewed his ischemia workup to date. His nuclear stress was abnormal, and his EF is about 45% -continue metoprolol succinate and losartan -he has a history of prior kidney disease, though recent labs encouraging. Would  need to discuss ARNI or MRA with his nephrologist -I am suspicious that this is ischemic in nature. We discussed defining anatomy by cath. We also reviewed the indications for stenting or CABG. He is feeling well on current medicines. After shared decision making, will proceed with medical management for now, but discussed what would move [name] to perform a cath. -working on increasing his activity now that his leg pain is improved  Tobacco abuse counseling: The patient was counseled on tobacco cessation today for 3 minutes.  Counseling included reviewing the risks of smoking tobacco products, how it impacts the patient's current medical diagnoses and different strategies for quitting.  Pharmacotherapy to aid in tobacco cessation was not prescribed today.  Cardiac risk counseling and prevention recommendations: -recommend heart healthy/Mediterranean diet, with whole grains, fruits, vegetable, fish, lean meats, nuts, and olive oil. Limit salt. -recommend moderate walking, 3-5 times/week for 30-50  minutes each session. Aim for at least 150 minutes.week. Goal should be pace of 3 miles/hours, or walking 1.5 miles in 30 minutes -recommend avoidance of tobacco products. Avoid excess alcohol.  Plan for follow up: 3 mos or sooner as needed  Buford Dresser, MD, PhD, [facility]    Medication Adjustments/Labs and Tests Ordered: Current medicines are reviewed at length with the patient today.  Concerns regarding medicines are outlined above.  No orders of the defined types were placed in this encounter.  No orders of the defined types were placed in this encounter.   Patient Instructions  Medication Instructions:  Your Physician recommend you continue on your current medication as directed.    *If you need a refill on your cardiac medications before your next appointment, please call your pharmacy*   Lab Work: None  Testing/Procedures: None   Follow-Up: At [facility], you and your health needs are our priority.  As part of our continuing mission to provide you with exceptional heart care, we have created designated Provider Care Teams.  These Care Teams include your primary Cardiologist (physician) and Advanced Practice Providers (APPs -  Physician Assistants and Nurse Practitioners) who all work together to provide you with the care you need, when you need it.  We recommend signing up for the patient portal called "MyChart".  Sign up information is provided on this After Visit Summary.  MyChart is used to connect with patients for Virtual Visits (Telemedicine).  Patients are able to view lab/test results, encounter notes, upcoming appointments, etc.  Non-urgent messages can be sent to your provider as well.   To learn more about what you can do with MyChart, go to [email or web address].    Your next appointment:   3 month(s)  The format for your next appointment:   In Person  Provider:   Buford Dresser, MD      Signed, Buford Dresser, MD PhD 11/25/2020     [place]

## 2020-11-26 ENCOUNTER — Encounter: Payer: Self-pay | Admitting: Cardiology

## 2020-12-24 ENCOUNTER — Encounter (HOSPITAL_COMMUNITY): Payer: 59

## 2020-12-24 ENCOUNTER — Ambulatory Visit: Payer: 59

## 2021-01-04 ENCOUNTER — Encounter: Payer: Self-pay | Admitting: Cardiology

## 2021-01-05 ENCOUNTER — Other Ambulatory Visit: Payer: Self-pay

## 2021-01-05 ENCOUNTER — Emergency Department (HOSPITAL_BASED_OUTPATIENT_CLINIC_OR_DEPARTMENT_OTHER): Payer: 59

## 2021-01-05 ENCOUNTER — Encounter (HOSPITAL_BASED_OUTPATIENT_CLINIC_OR_DEPARTMENT_OTHER): Payer: Self-pay

## 2021-01-05 ENCOUNTER — Emergency Department (HOSPITAL_BASED_OUTPATIENT_CLINIC_OR_DEPARTMENT_OTHER)
Admission: EM | Admit: 2021-01-05 | Discharge: 2021-01-05 | Disposition: A | Discharge status: Home / Self Care | Payer: 59 | Attending: Emergency Medicine | Admitting: Emergency Medicine

## 2021-01-05 DIAGNOSIS — E119 Type 2 diabetes mellitus without complications: Secondary | ICD-10-CM | POA: Diagnosis not present

## 2021-01-05 DIAGNOSIS — Z79899 Other long term (current) drug therapy: Secondary | ICD-10-CM | POA: Diagnosis not present

## 2021-01-05 DIAGNOSIS — Y92009 Unspecified place in unspecified non-institutional (private) residence as the place of occurrence of the external cause: Secondary | ICD-10-CM | POA: Diagnosis not present

## 2021-01-05 DIAGNOSIS — K118 Other diseases of salivary glands: Secondary | ICD-10-CM

## 2021-01-05 DIAGNOSIS — R0781 Pleurodynia: Secondary | ICD-10-CM | POA: Insufficient documentation

## 2021-01-05 DIAGNOSIS — I2510 Atherosclerotic heart disease of native coronary artery without angina pectoris: Secondary | ICD-10-CM | POA: Insufficient documentation

## 2021-01-05 DIAGNOSIS — M25512 Pain in left shoulder: Secondary | ICD-10-CM | POA: Insufficient documentation

## 2021-01-05 DIAGNOSIS — W19XXXA Unspecified fall, initial encounter: Secondary | ICD-10-CM | POA: Insufficient documentation

## 2021-01-05 DIAGNOSIS — D110 Benign neoplasm of parotid gland: Secondary | ICD-10-CM | POA: Diagnosis not present

## 2021-01-05 DIAGNOSIS — Z7984 Long term (current) use of oral hypoglycemic drugs: Secondary | ICD-10-CM | POA: Diagnosis not present

## 2021-01-05 DIAGNOSIS — I119 Hypertensive heart disease without heart failure: Secondary | ICD-10-CM | POA: Insufficient documentation

## 2021-01-05 DIAGNOSIS — H5509 Other forms of nystagmus: Secondary | ICD-10-CM | POA: Diagnosis not present

## 2021-01-05 DIAGNOSIS — H1131 Conjunctival hemorrhage, right eye: Secondary | ICD-10-CM | POA: Diagnosis not present

## 2021-01-05 DIAGNOSIS — S0083XA Contusion of other part of head, initial encounter: Secondary | ICD-10-CM | POA: Diagnosis not present

## 2021-01-05 DIAGNOSIS — S0990XA Unspecified injury of head, initial encounter: Secondary | ICD-10-CM | POA: Diagnosis present

## 2021-01-05 DIAGNOSIS — Z7982 Long term (current) use of aspirin: Secondary | ICD-10-CM | POA: Insufficient documentation

## 2021-01-05 DIAGNOSIS — F17210 Nicotine dependence, cigarettes, uncomplicated: Secondary | ICD-10-CM | POA: Insufficient documentation

## 2021-01-05 DIAGNOSIS — E039 Hypothyroidism, unspecified: Secondary | ICD-10-CM | POA: Insufficient documentation

## 2021-01-05 MED ORDER — METHOCARBAMOL 500 MG PO TABS: 500.0000 mg | ORAL_TABLET | Freq: Two times a day (BID) | ORAL | 0 refills | Status: DC

## 2021-01-05 MED ORDER — HYDROCODONE-ACETAMINOPHEN 5-325 MG PO TABS
1.0000 | ORAL_TABLET | Freq: Once | ORAL | Status: AC
  Administered 2021-01-05: 1 via ORAL
  Filled 2021-01-05: qty 1

## 2021-01-05 MED ORDER — ACETAMINOPHEN 325 MG PO TABS
650.0000 mg | ORAL_TABLET | Freq: Once | ORAL | Status: AC
  Administered 2021-01-05: 650 mg via ORAL
  Filled 2021-01-05: qty 2

## 2021-01-05 NOTE — Discharge Instructions (Addendum)
Your CT imaging today was reassuring.  There are some small parotid gland nodules.  If you have had these biopsied in the past please follow-up with your primary care provider to discuss these findings.  You may use Tylenol and ibuprofen for pain as discussed below.  I have also prescribed you Robaxin which is a muscle relaxer for pain.  Please use Tylenol or ibuprofen for pain.  You may use 600 mg ibuprofen every 6 hours or 1000 mg of Tylenol every 6 hours.  You may choose to alternate between the 2.  This would be most effective.  Not to exceed 4 g of Tylenol within 24 hours.  Not to exceed 3200 mg ibuprofen 24 hours.   Please follow back up with your orthopedic provider for reassessment of your shoulder.

## 2021-01-05 NOTE — ED Triage Notes (Addendum)
Pt states he fell in his home 3/12-pain to left UE, left ribs and head-states he was seen by ortho 3/13-states he had xrays-was seen by PCP today and sent for head injury-bruising/swelling noted around right eye/forehead-no LOC-NAD-steady gait

## 2021-01-05 NOTE — ED Provider Notes (Signed)
[facility] EMERGENCY DEPARTMENT Provider Note   CSN: [id number] Arrival date & time: 01/05/21  1534     History Chief Complaint  Patient presents with  . Fall    Shelden Raborn is a 55 y.o. male.  HPI Patient is a 55 year old male with past medical history significant for DM 2, HTN, HLD, CAD with stents on dual antiplatelet therapy  Patient states that he fell 3/12 outside his house when it was dark he tripped over some exercise equipment struck his right eyebrow/forehead against a large 25 pound circular weight for bench press machine.  He states that he was seen by an orthopedic doctor 3/13 and had x-rays of his arm and his rib he was told he had a left shoulder rotator cuff injury.  He states that he has plan to follow back up with his orthopedic provider however he states that he was seen by his primary care doctor today and because of the extensive bruising on his face was sent to the ER for evaluation.  He denies any loss of consciousness during the incident but states that he has had some headaches since.  No lightheadedness or dizziness.  No chest pain or shortness of breath.  No neck pain.  Denies any other associate symptoms.  He states his rib cage is still hurting somewhat from when he fell.  He states that he has been taking deep breaths that she was instructed to do.  He states that he did not have any rib fractures.    Past Medical History:  Diagnosis Date  . Diabetes mellitus without complication ([place])   . Hyperlipemia   . Hypertension   . Insomnia   . Thyroid disease     Patient Active Problem List   Diagnosis Date Noted  . Diabetes mellitus ([place]) 03/26/2020  . Hypertensive disorder 03/26/2020  . Hypothyroidism 03/26/2020  . Chronic pain of left ankle 07/11/2019  . Strain of left Achilles tendon 07/11/2019  . 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 INTRINSIC ASTHMA, WITH EXACERBATION 12/05/2008  .  COUGH 12/05/2008  . CHEST PAIN 12/05/2008  . ALLERGY, ENVIRONMENTAL 12/05/2008  . HEADACHE, CHRONIC, HX OF 12/05/2008    Past Surgical History:  Procedure Laterality Date  . ABDOMINAL AORTOGRAM W/LOWER EXTREMITY Bilateral 08/21/2020   Procedure: ABDOMINAL AORTOGRAM W/LOWER EXTREMITY;  Surgeon: Elam Dutch, MD;  Location: [facility];  Service: Cardiovascular;  Laterality: Bilateral;  . ABDOMINAL AORTOGRAM W/LOWER EXTREMITY N/A 09/04/2020   Procedure: ABDOMINAL AORTOGRAM W/LOWER EXTREMITY;  Surgeon: Elam Dutch, MD;  Location: [facility];  Service: Cardiovascular;  Laterality: N/A;  . COLONOSCOPY     x2  . FEMORAL ARTERY EXPLORATION Left 09/04/2020   Procedure: FEMORAL ARTERY EXPLORATION;  Surgeon: Angelia Mould, MD;  Location: [facility];  Service: Vascular;  Laterality: Left;  . NASAL SINUS SURGERY    . PERIPHERAL VASCULAR INTERVENTION Left 08/21/2020   Procedure: PERIPHERAL VASCULAR INTERVENTION;  Surgeon: Elam Dutch, MD;  Location: [facility];  Service: Cardiovascular;  Laterality: Left;  sfa stent x 3  . PERIPHERAL VASCULAR INTERVENTION  09/04/2020   Procedure: PERIPHERAL VASCULAR INTERVENTION;  Surgeon: Elam Dutch, MD;  Location: [facility];  Service: Cardiovascular;;  Right SFA  . WISDOM TOOTH EXTRACTION    . WRIST SURGERY     bilat       Family History  Problem Relation Age of Onset  . Colon cancer  Father   . Lung cancer Father   . Atrial fibrillation Mother   . Autoimmune disease Sister   . Healthy Daughter   . Healthy Son     Social History   Tobacco Use  . Smoking status: Current Every Day Smoker    Packs/day: 0.50    Types: Cigarettes  . Smokeless tobacco: Former Systems developer  . Tobacco comment: 1/2 PACK A DAY  Vaping Use  . Vaping Use: Never used  Substance Use Topics  . Alcohol use: Yes    Comment: occ  . Drug use: No    Home Medications Prior to Admission medications   Medication Sig Start Date  End Date Taking? Authorizing Provider  methocarbamol (ROBAXIN) 500 MG tablet Take 1 tablet (500 mg total) by mouth 2 (two) times daily. 01/05/21  Yes Stephie Xu, Ova Fresh[place] S, PA  acetaminophen (TYLENOL) 500 MG tablet Take 500-1,000 mg by mouth every 6 (six) hours as needed (for pain.).    [provider]  aspirin EC 81 MG tablet Take 1 tablet (81 mg total) by mouth daily. Patient taking differently: Take 81 mg by mouth every evening. 07/30/20   Elam Dutch, MD  atorvastatin (LIPITOR) 80 MG tablet Take 80 mg by mouth every evening.    [provider]  clopidogrel (PLAVIX) 75 MG tablet Take 1 tablet (75 mg total) by mouth daily. 08/21/20   Elam Dutch, MD  Febuxostat 80 MG TABS Take 80 mg by mouth every evening.    [provider]  gabapentin (NEURONTIN) 300 MG capsule Take 300 mg by mouth in the morning, at noon, and at bedtime.  02/26/19   [provider]  levothyroxine (SYNTHROID, LEVOTHROID) 200 MCG tablet Take 200 mcg by mouth every evening.     [provider]  losartan (COZAAR) 100 MG tablet Take 100 mg by mouth every evening.  07/20/20   [provider]  metFORMIN (GLUCOPHAGE) 500 MG tablet Take 500 mg by mouth every evening.     [provider]  metoprolol succinate (TOPROL-XL) 50 MG 24 hr tablet Take 1 tablet (50 mg total) by mouth daily. Take with or immediately following a meal. 09/23/20 12/22/20  Buford Dresser, MD  oxyCODONE-acetaminophen (PERCOCET/ROXICET) 5-325 MG tablet Take 1 tablet by mouth every 4 (four) hours as needed for severe pain. 09/05/20   Elam Dutch, MD  pseudoephedrine (SUDAFED) 120 MG 12 hr tablet Take 120 mg by mouth 2 (two) times daily as needed for congestion.    [provider]  tadalafil (CIALIS) 20 MG tablet Take 20 mg by mouth daily as needed for erectile dysfunction.     [provider]  temazepam (RESTORIL) 30 MG capsule Take 30 mg by mouth at bedtime.    [provider]  topiramate (TOPAMAX) 100 MG tablet Take 1 tablet (100 mg total) by mouth at bedtime. 02/03/20   Pieter Partridge, DO  traMADol (ULTRAM) 50 MG tablet Take 1 tablet (50 mg total) by mouth every 6 (six) hours as needed. Patient taking differently: Take 50 mg by mouth every 6 (six) hours as needed (pain.). 07/10/20   Pieter Partridge, DO    Allergies    Aspirin, Avelox [moxifloxacin hcl in nacl], Nsaids, and Penicillins  Review of Systems   Review of Systems  Constitutional: Negative for chills and fever.  HENT: Negative for congestion.   Respiratory: Negative for shortness of breath.   Cardiovascular: Negative for chest pain.  Gastrointestinal: Negative for abdominal pain.  Musculoskeletal: Negative for neck pain.       Left side pain, left shoulder pain, right eye swelling and bruising    Physical Exam Updated Vital Signs BP (!) 152/102 (BP Location: Left Arm)   Pulse 93   Temp 97.8 F (36.6 C) (Oral)   Resp 18   Ht 6\' 1"  (1.854 m)   Wt 99.8 kg   SpO2 94%   BMI 29.03 kg/m   Physical Exam Vitals and nursing note reviewed.  Constitutional:      General: He is not in acute distress.    Appearance: Normal appearance. He is not ill-appearing.  HENT:     Head: Normocephalic.  Eyes:     General: No scleral icterus.       Right eye: No discharge.        Left eye: No discharge.     Comments: Extraocular movements are intact.  Conjunctive right medial/nasal aspect of sclera is notable for a subconjunctival hemorrhage.  No hyphema  Pulmonary:     Effort: Pulmonary effort is normal.     Breath sounds: No stridor.     Comments: Chest without bruising or tenderness palpation Abdominal:     Tenderness: There is no abdominal tenderness.     Comments: Abdomen is soft and nontender.  No guarding or rebound.  No bruising or abrasions  Musculoskeletal:     Comments: Some mild tenderness palpation of left rib cage approximately rib cage level 4 through 6.  Some diffuse  tenderness to palpation of the left shoulder no focal bony tenderness.  Good remove range of motion and strength in bilateral grips and flexion extension of elbows is 5/5.  No C, T, L-spine tenderness to palpation.  Neurological:     Mental Status: He is alert and oriented to person, place, and time. Mental status is at baseline.         ED Results / Procedures / Treatments   Labs (all labs ordered are listed, but only abnormal results are displayed) Labs Reviewed - No data to display  EKG None  Radiology CT Head Wo Contrast  Result Date: 01/05/2021 CLINICAL DATA:  Fall 3 days ago striking right side of face and head on dumbbell in garage. Dizzy. EXAM: CT HEAD WITHOUT CONTRAST TECHNIQUE: Contiguous axial images were obtained from the base of the skull through the vertex without intravenous contrast. COMPARISON:  Brain MRI 07/30/2020 FINDINGS: Brain: No evidence of acute infarction, hemorrhage, hydrocephalus, extra-axial collection or mass lesion/mass effect. Small remote cerebellar infarct is unchanged from prior MRI. Previous left caudate infarct is not well seen by CT. Vascular: No hyperdense vessel or unexpected calcification. Skull: Normal. Negative for fracture or focal lesion. Sinuses/Orbits: Assessed on concurrent face CT, reported separately. Other: Right frontal scalp hematoma. Minimal midline frontal scalp hematoma. IMPRESSION: Right frontal scalp hematoma. No acute intracranial abnormality. No skull fracture. Electronically Signed   By: Keith Rake M.D.   On: 01/05/2021 18:26   CT Maxillofacial Wo Contrast  Result Date: 01/05/2021 CLINICAL DATA:  Facial trauma. Fall 3 days ago striking right side of face and head on dumbbell in garage. Dizziness. EXAM: CT MAXILLOFACIAL WITHOUT CONTRAST TECHNIQUE: Multidetector CT imaging of the maxillofacial structures was performed. Multiplanar CT image reconstructions were also generated. COMPARISON:  None. FINDINGS: Osseous: No acute  fracture of the nasal bone, zygomatic arches, or mandibles. The temporomandibular joints are congruent. Minimal leftward nasal septal deviation. Probable dentigerous cyst in the midline maxilla adjacent to the right upper central incisor.  Orbits: No orbital fracture. No globe injury. There is right periorbital soft tissue edema. Sinuses: No sinus fracture or fluid level. Mucosal thickening of scattered ethmoid air cells. The frontal sinuses are hypo pneumatized. Included mastoid air cells are clear. Soft tissues: Right periorbital soft tissue hematoma. Mild patchy soft tissue edema in the right face. Left parotid nodules, largest measuring up to 3.1 cm, adjacent additional parotid nodules. There multiple small right parotid nodules largest measuring 5 mm. Limited intracranial: Assistant concurrent head CT, reported separately. IMPRESSION: 1. Right periorbital soft tissue hematoma. No orbital or facial bone fracture. 2. Multiple bilateral parotid nodules, largest measuring 3.1 cm on the left. This was previously biopsied 06/20/2019, recommend correlation with biopsy results. 3. Probable dentigerous cyst in the midline maxilla adjacent to the right upper central incisor. Electronically Signed   By: Keith Rake M.D.   On: 01/05/2021 18:31    Procedures Procedures   Medications Ordered in ED Medications  HYDROcodone-acetaminophen (NORCO/VICODIN) 5-325 MG per tablet 1 tablet (1 tablet Oral Given 01/05/21 1741)  acetaminophen (TYLENOL) tablet 650 mg (650 mg Oral Given 01/05/21 1741)    ED Course  I have reviewed the triage vital signs and the nursing notes.  Pertinent labs & imaging results that were available during my care of the patient were reviewed by me and considered in my medical decision making (see chart for details).  Patient is a 55 year old male with past medical history discussed in HPI.  Patient here after head trauma that occurred 3/12. He has already been evaluated by orthopedics  and diagnosed with rotator cuff tear and left rib contusion.  On examination today he has significant bruising to his right and right forehead.  Will obtain CT imaging of head and max of facial CT scan to evaluate for fractures.  He does not have any evidence of orbital fracture with no entrapment and extraocular movements are fully intact.  Does not have any focal bony tenderness of the face I suspect that the abnormal findings on his physical exam are from contusion/subcutaneous bleeding he is dual antiplatelet therapy treated due to his lower extremity stents.  Clinical Course as of 01/05/21 1943  Tue Jan 05, 2021  1832 No skull fracture or intracranial abnormality.  [WF]    Clinical Course User Index [WF] Tedd Sias, [place]  CT images reviewed and I agree of radiology read.  No acute abnormalities.  He will follow-up with ENT for his parotid glands   MDM Rules/Calculators/A&P                          Patient reevaluated.  He states that his rib cage pain is much improved after the Norco.  He will follow up with his orthopedic provider as well as his PCP.  He understands that he does have some abnormal parotid glands these have been evaluated by ENT and he is scheduled for surgery.  He is aware that he also has bilateral parotid gland abnormalities he was only aware of the left side.  All questions answered best my ability.  Patient discharged with Robaxin for muscle pain and Tylenol recommendations  Final Clinical Impression(s) / ED Diagnoses Final diagnoses:  Fall, initial encounter  Subconjunctival hemorrhage of right eye  Rib pain  Horizontal nystagmus  Parotid nodule    Rx / DC Orders ED Discharge Orders         Ordered    methocarbamol (ROBAXIN) 500 MG tablet  2 times  daily        01/05/21 [place], Jalani Rominger S, PA 01/05/21 1945    Lennice Sites, DO 01/05/21 2312

## 2021-01-18 NOTE — Progress Notes (Signed)
NEUROLOGY FOLLOW UP OFFICE NOTE  Dustin Sampson [id number]  Assessment/Plan:   1.  Balance disorder - intermittent - some improvement following stents but not resolved.  Also, given the nystagmus, while PAD contributed to balance problems, not the only etiology.  He does not exhibit smptoms of progressive supranuclear palsy or Parkinson disease.  Imaging of brian reveals no evidence of NPH.  He reports similar symptoms with his father - consider genetic etiology - no cerebellar atrophy to suggest cerebellar ataxia.  Although late age of onset, consider episodic ataxia.  MRI brain does demonstrate small chronic cerebellar infarct but I Travaughn't think this would explain the symptoms.  Although he does not exhibit any significant chronic cerebellar/brainstem vascular disease, would still evaluate for vertebrobasilar insufficiency. 2.  Intermittent diplopia - unclear if related to underlying central vestibulopathy or related to diabetes - Myasthenia gravis not suspected. 3.  HTN  1. Will start a trial of acetazolamide 250mg  twice daily to see if balance improves.  We can increase to 500mg  twice daily in 2 months if needed.  Discussed side effects such as paresthesias. 2.  Check CTA of head to evaluate for vertebrobasilar insufficiency 3.  Check CBC, CMP, B1, B12, TSH 4.  Follow up with PCP regarding blood pressure 5.  Follow up 4 months.  Subjective:  Dustin Sampson is a 55year old male with hypertension, hyperlipidemia, monoclonal paraproteinemia,parotid gland abnormalities, and hypothyroidism whofollows up for balance problems.  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  Vascular studies were positive for peripheral arterial disease.  He had stents put in which has helped with the leg pain but not the balance.  I had  referred him to ophthalmology but he never made an appointment.  He still gets double vision once in awhile.  On 01/02/2021, he tripped over a barbell on the floor, striking the right side of his forehead and eyebrow on one of the weights for the bench press.  He was seen in the ED on 01/05/2021 where CT head and maxillofacial revealed no acute abnormalities.  Current NSAIDS:Contraindicated (kidney problems) Current analgesics:2 tramadol Current triptans:none Current ergotamine:none Current anti-emetic:none Current muscle relaxants:none Current anti-anxiolytic:none Current sleep aide:Restoril Current Antihypertensive medications:Losartan 50mg  Current Antidepressant medications:none Current Anticonvulsant medications:Topiramate 100mg  at bedtime (has not been taking; gabapentin 300mg  three times daily (for neuropathy) Current anti-CGRP:none Current Vitamins/Herbal/Supplements:none Current Antihistamines/Decongestants:Sudafed Other therapy:none  Caffeine: 1 to 3 cups of coffee daily. Exercise:no Depression:no; Anxiety:Yes. Other pain:gout Sleep hygiene:varies  HISTORY: He has had balance problems since around 2018 but has gotten worse. It is not associated with dizziness. He feels wobbly when he is walking. It is intermittent. He feels that his legs are weak.  If he walks 200 years, he has pain, described as tightness in back of his ankles and calves.  If he rests, he feels okay. He has some low back pain and notes some numbness in the feet.  He takes gabapentin which isn't too helpful.  He may spend a couple of hours standing working in the yard and when he later is standing in the shower, it feels like his legs will buckle.  He also reports that he sometimes has double vision, such as while driving or watching TV and he needs to shut one eye. He went to the eye doctor and thought that he may have had astigmatism.  He reports droopy eyelids and is  concerned about myasthenia gravis (doctors thought his father may have had MG, but that was ruled out). However, Myasthenia gravis panel (striated muscle Ab, AchR binding Ab) from April 2021 was negative. He does have hypothyroidism which he reports is currently not well-treated on Synthroid and is supposed to see an endocrinologist. He has longstanding history of diabetes.  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  He has a history of poor sleep and takes Restoril. He does not feel fatigued during the day. His wife says he does not snore much. He is a cigarette smoker. He does not drink alcohol. He does not exercise routinely. He says he hydrates with water during the day.   He has had chronic headaches all of his life. He reports history of migraines, tension type headache and "sinus" headaches.Hereports a constant moderate pressure headache (sometimes throbbing) located above either eye (right more than left) and back of the head. He has associated neck tightness. It is constant. On only rare occasions is there nausea. Otherwise, no nausea, vomiting, photophobia, phonophobia or visual disturbance. Alcohol is a trigger. Nothing really relieves it except for pain relievers. 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  I saw him in October 2018. At that time, he reported takingFioricet twice daily (for 20 years),Advil daily (to treat headache as well as joint pain), drankcaffeine every morning andat takes would takeExcedrin as well. He alsowould usean intranasal decongestant. At that time, I had started him on nortriptyline and given him instructions on how to taper off of Fioricet. He never followed up.  Past NSAIDS:Advil Past analgesics:Excedrin; Fioricet Past abortive triptans:none Past abortive ergotamine:none Past  muscle relaxants:none Past anti-emetic:none Past antihypertensive medications:none Past antidepressant medications:Nortriptyline 25mg  (did not do well with it) Past anticonvulsant medications:none Past anti-CGRP:none Past vitamins/Herbal/Supplements:none Other past therapies:none  PAST MEDICAL HISTORY: Past Medical History:  Diagnosis Date  . Diabetes mellitus without complication ([place])   . Hyperlipemia   . Hypertension   . Insomnia   . Thyroid disease     MEDICATIONS: Current Outpatient Medications on File Prior to Visit  Medication Sig Dispense Refill  . acetaminophen (TYLENOL) 500 MG tablet Take 500-1,000 mg by mouth every 6 (six) hours as needed (for pain.).    Marland Kitchen aspirin EC 81 MG tablet Take 1 tablet (81 mg total) by mouth daily. (Patient taking differently: Take 81 mg by mouth every evening.) 150 tablet 2  . atorvastatin (LIPITOR) 80 MG tablet Take 80 mg by mouth every evening.    . clopidogrel (PLAVIX) 75 MG tablet Take 1 tablet (75 mg total) by mouth daily. 30 tablet 11  . Febuxostat 80 MG TABS Take 80 mg by mouth every evening.    . gabapentin (NEURONTIN) 300 MG capsule Take 300 mg by mouth in the morning, at noon, and at bedtime.     Marland Kitchen levothyroxine (SYNTHROID, LEVOTHROID) 200 MCG tablet Take 200 mcg by mouth every evening.     Marland Kitchen losartan (COZAAR) 100 MG tablet Take 100 mg by mouth every evening.     . metFORMIN (GLUCOPHAGE) 500 MG tablet Take 500 mg by mouth every evening.     . methocarbamol (ROBAXIN) 500 MG tablet Take 1 tablet (500 mg total) by mouth 2 (two) times daily. 20 tablet 0  . metoprolol succinate (TOPROL-XL) 50 MG 24 hr tablet Take 1 tablet (50 mg total) by mouth  daily. Take with or immediately following a meal. 90 tablet 3  . oxyCODONE-acetaminophen (PERCOCET/ROXICET) 5-325 MG tablet Take 1 tablet by mouth every 4 (four) hours as needed for severe pain. 12 tablet 0  . pseudoephedrine (SUDAFED) 120 MG 12 hr tablet Take 120 mg by mouth 2 (two)  times daily as needed for congestion.    . tadalafil (CIALIS) 20 MG tablet Take 20 mg by mouth daily as needed for erectile dysfunction.     . temazepam (RESTORIL) 30 MG capsule Take 30 mg by mouth at bedtime.    . topiramate (TOPAMAX) 100 MG tablet Take 1 tablet (100 mg total) by mouth at bedtime. 30 tablet 3  . traMADol (ULTRAM) 50 MG tablet Take 1 tablet (50 mg total) by mouth every 6 (six) hours as needed. (Patient taking differently: Take 50 mg by mouth every 6 (six) hours as needed (pain.).) 10 tablet 2   No current facility-administered medications on file prior to visit.    ALLERGIES: Allergies  Allergen Reactions  . 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 Avelox [Moxifloxacin Hcl In Nacl] Nausea And Vomiting    "made me real sick"  . Nsaids   . Penicillins Other (See Comments)     severe GI upset Reaction: Childhood    FAMILY HISTORY: Family History  Problem Relation Age of Onset  . Colon cancer Father   . Lung cancer Father   . Atrial fibrillation Mother   . Autoimmune disease Sister   . Healthy Daughter   . Healthy Son       Objective:  Blood pressure (!) 165/100, pulse 98, height 6\' 1"  (1.854 m), weight 220 lb (99.8 kg), SpO2 97 %. General: No acute distress.  Patient appears well-groomed.   Head:  Normocephalic/atraumatic Eyes:  Fundi examined but not visualized Neck: supple, no paraspinal tenderness, full range of motion Heart:  Regular rate and rhythm Lungs:  Clear to auscultation bilaterally Back: No paraspinal tenderness Neurological Exam: alert and oriented to person, place, and time. Attention span and concentration intact, recent and remote memory intact, fund of knowledge intact.  Speech fluent and not dysarthric, language intact.  Saccadic eye movements on tracking, left end-gaze nystagmus.  Otherwise, CN II-XII intact. Bulk and tone normal, muscle strength 5/5 throughout.  Sensation to  pinprick and vibration intact.  Deep tendon reflexes 1+ throughout, toes downgoing.  Finger to nose and heel to shin testing intact.  Gait wide-based, 4 step turn, unable to tandem walk, Romberg negative.     Metta Clines, DO  CC: [name], PA-C

## 2021-01-19 ENCOUNTER — Other Ambulatory Visit: Payer: Self-pay

## 2021-01-19 ENCOUNTER — Ambulatory Visit: Payer: 59 | Admitting: Neurology

## 2021-01-19 ENCOUNTER — Other Ambulatory Visit (INDEPENDENT_AMBULATORY_CARE_PROVIDER_SITE_OTHER): Payer: 59

## 2021-01-19 ENCOUNTER — Encounter: Payer: Self-pay | Admitting: Neurology

## 2021-01-19 VITALS — BP 165/100 | HR 98 | Ht 73.0 in | Wt 220.0 lb

## 2021-01-19 DIAGNOSIS — I10 Essential (primary) hypertension: Secondary | ICD-10-CM | POA: Diagnosis not present

## 2021-01-19 DIAGNOSIS — H8193 Unspecified disorder of vestibular function, bilateral: Secondary | ICD-10-CM

## 2021-01-19 DIAGNOSIS — H532 Diplopia: Secondary | ICD-10-CM

## 2021-01-19 DIAGNOSIS — R2689 Other abnormalities of gait and mobility: Secondary | ICD-10-CM

## 2021-01-19 LAB — COMPREHENSIVE METABOLIC PANEL
ALT: 18 U/L (ref 0–53)
AST: 16 U/L (ref 0–37)
Albumin: 4.1 g/dL (ref 3.5–5.2)
Alkaline Phosphatase: 123 U/L — ABNORMAL HIGH (ref 39–117)
BUN: 11 mg/dL (ref 6–23)
CO2: 27 mEq/L (ref 19–32)
Calcium: 9.5 mg/dL (ref 8.4–10.5)
Chloride: 100 mEq/L (ref 96–112)
Creatinine, Ser: 0.9 mg/dL (ref 0.40–1.50)
GFR: 96.32 mL/min (ref >60.00)
Glucose, Bld: 115 mg/dL — ABNORMAL HIGH (ref 70–99)
Potassium: 5 mEq/L (ref 3.5–5.1)
Sodium: 134 mEq/L — ABNORMAL LOW (ref 135–145)
Total Bilirubin: 0.4 mg/dL (ref 0.2–1.2)
Total Protein: 7.6 g/dL (ref 6.0–8.3)

## 2021-01-19 LAB — VITAMIN B12: Vitamin B-12: 383 pg/mL (ref 211–911)

## 2021-01-19 LAB — TSH: TSH: 2.61 u[IU]/mL (ref 0.35–4.50)

## 2021-01-19 MED ORDER — ACETAZOLAMIDE 125 MG PO TABS: 250.0000 mg | ORAL_TABLET | Freq: Two times a day (BID) | ORAL | 5 refills | Status: DC

## 2021-01-19 NOTE — Patient Instructions (Addendum)
1.  Start acetazolamide 150mg  pill - take 2 pills (250mg ) twice daily and see if balance improves.  If no improvement in 2 months, contact me and we can increase  2.  Check CBC, CMP, B1, B12, TSH 3.  Check CTA of head 4.  Follow up 4 months

## 2021-01-23 LAB — VITAMIN B1: Vitamin B1 (Thiamine): 9 nmol/L (ref 8–30)

## 2021-01-25 ENCOUNTER — Telehealth: Payer: Self-pay | Admitting: Neurology

## 2021-01-25 NOTE — Telephone Encounter (Signed)
Patient would like to know if Dr Tomi Likens would send in prescription for Fioricet to the [facility] on [address] for him? He states he took this medication in the past to help with his headaches. He hasn't needed it in a while but his headaches are coming back and he believes it is due to stress. Please call.

## 2021-01-26 ENCOUNTER — Telehealth: Payer: Self-pay

## 2021-01-26 NOTE — Telephone Encounter (Signed)
-----   Message from Pieter Partridge, DO sent at 01/25/2021  8:02 AM EDT ----- Overall labs unremarkable.  Sodium borderline low and alkaline phosphatase (a lab for liver) is borderline elevated but nothing clinically significant.

## 2021-01-26 NOTE — Telephone Encounter (Signed)
Pt called and informed that Dr Tomi Likens prefers not to prescribe Fioricet due to strong potential of causing rebound headache.  That was the problem when Dr Tomi Likens first saw him for headaches.  He should really treat with an OTC analgesic such as ibuprofen, Tylenol or Excedrin.  If that is ineffective, then Dr Tomi Likens would restart topiramate - 25mg  at bedtime for a week, then 50mg  at bedtime for a week, then 100mg  at bedtime. Pt stated that he would try the OTC medication first. Pt advised to call the office back if he changes his mind about starting the topiramate.

## 2021-01-26 NOTE — Telephone Encounter (Signed)
Pt called and informed that Overall labs unremarkable. Sodium borderline low and alkaline phosphatase (a lab for liver) is borderline elevated but nothing clinically significant

## 2021-01-26 NOTE — Telephone Encounter (Signed)
I prefer not to prescribe Fioricet due to strong potential of causing rebound headache.  That was the problem when I first saw him for headaches.  He should really treat with an OTC analgesic such as ibuprofen, Tylenol or Excedrin.  If that is ineffective, then I would restart topiramate - 25mg  at bedtime for a week, then 50mg  at bedtime for a week, then 100mg  at bedtime (which was the previous dose he took).

## 2021-01-27 ENCOUNTER — Other Ambulatory Visit: Payer: Self-pay | Admitting: Orthopedic Surgery

## 2021-01-29 NOTE — Patient Instructions (Addendum)
DUE TO COVID-19 ONLY ONE VISITOR IS ALLOWED TO COME WITH YOU AND STAY IN THE WAITING ROOM ONLY DURING PRE OP AND PROCEDURE.   **NO VISITORS ARE ALLOWED IN THE SHORT STAY AREA OR RECOVERY ROOM!!**    COVID SWAB TESTING MUST BE COMPLETED ON:  Tuesday, 02-02-21 @ 9:30 AM   [address] [place], [place] [address]  (Must self quarantine after testing. Follow instructions on handout.)   Your procedure is scheduled on: Thursday, 02-04-21   Report to [facility] Main  Entrance    Report to admitting at 11:20 AM   Call this number if you have problems the morning of surgery [phone]   Do not eat food :After Midnight.   May have liquids until 10:20 AM day of surgery  CLEAR LIQUID DIET  Foods Allowed                                                                     Foods Excluded  Water, Black Coffee and tea, regular and decaf             liquids that you cannot  Plain Jell-O in any flavor  (No red)                                    see through such as: Fruit ices (not with fruit pulp)                                      milk, soups, orange juice              Iced Popsicles (No red)                                      All solid food                                   Apple juices Sports drinks like Gatorade (No red) Lightly seasoned clear broth or consume(fat free) Sugar, honey syrup    Oral Hygiene is also important to reduce your risk of infection.                                    Remember - BRUSH YOUR TEETH THE MORNING OF SURGERY WITH YOUR REGULAR TOOTHPASTE   Do NOT smoke after Midnight   Take these medicines the morning of surgery with A SIP OF WATER: Gabapentin, Levothyroxine, Metoprolol  How to Manage Your Diabetes Before and After Surgery  Why is it important to control my blood sugar before and after surgery? . Improving blood sugar levels before and after surgery helps healing and can limit problems. . A way of improving blood sugar control is eating a  healthy diet by: o  Eating less sugar and carbohydrates o  Increasing activity/exercise o  Talking with your doctor about reaching your  blood sugar goals . High blood sugars (greater than 180 mg/dL) can raise your risk of infections and slow your recovery, so you will need to focus on controlling your diabetes during the weeks before surgery. . Make sure that the doctor who takes care of your diabetes knows about your planned surgery including the date and location.  How do I manage my blood sugar before surgery? . Check your blood sugar at least 4 times a day, starting 2 days before surgery, to make sure that the level is not too high or low. o Check your blood sugar the morning of your surgery when you wake up and every 2 hours until you get to the Short Stay unit. . If your blood sugar is less than 70 mg/dL, you will need to treat for low blood sugar: o Do not take insulin. o Treat a low blood sugar (less than 70 mg/dL) with  cup of clear juice (cranberry or apple), 4 glucose tablets, OR glucose gel. o Recheck blood sugar in 15 minutes after treatment (to make sure it is greater than 70 mg/dL). If your blood sugar is not greater than 70 mg/dL on recheck, call [phone] for further instructions. . Report your blood sugar to the short stay nurse when you get to Short Stay.  . If you are admitted to the hospital after surgery: o Your blood sugar will be checked by the staff and you will probably be given insulin after surgery (instead of oral diabetes medicines) to make sure you have good blood sugar levels. o The goal for blood sugar control after surgery is 80-180 mg/dL.   WHAT DO I DO ABOUT MY DIABETES MEDICATION?  Marland Kitchen Do not take oral diabetes medicines (pills) the morning of surgery.  . THE DAY BEFORE SURGERY:  Take Metformin as prescribed.       . THE MORNING OF SURGERY:  Do not take Metformin.   Reviewed and Endorsed by [facility] Patient Education Committee, August 2015                                You may not have any metal on your body including  jewelry, and body piercings             Do not wear  lotions, powders,cologne, or deodorant             Men may shave face and neck.   Do not bring valuables to the hospital. [place].   Contacts, dentures or bridgework may not be worn into surgery.   Patients discharged the day of surgery will not be allowed to drive home.                Please read over the following fact sheets you were given: IF YOU HAVE QUESTIONS ABOUT YOUR PRE OP INSTRUCTIONS PLEASE CALL  [facility] - Preparing for Surgery Before surgery, you can play an important role.  Because skin is not sterile, your skin needs to be as free of germs as possible.  You can reduce the number of germs on your skin by washing with CHG (chlorahexidine gluconate) soap before surgery.  CHG is an antiseptic cleaner which kills germs and bonds with the skin to continue killing germs even after washing. Please DO NOT use if  you have an allergy to CHG or antibacterial soaps.  If your skin becomes reddened/irritated stop using the CHG and inform your nurse when you arrive at Short Stay. Do not shave (including legs and underarms) for at least 48 hours prior to the first CHG shower.  You may shave your face/neck.  Please follow these instructions carefully:  1.  Shower with CHG Soap the night before surgery and the  morning of surgery.  2.  If you choose to wash your hair, wash your hair first as usual with your normal  shampoo.  3.  After you shampoo, rinse your hair and body thoroughly to remove the shampoo.                             4.  Use CHG as you would any other liquid soap.  You can apply chg directly to the skin and wash.  Gently with a scrungie or clean washcloth.  5.  Apply the CHG Soap to your body ONLY FROM THE NECK DOWN.   Do   not use on face/ open                           Wound  or open sores. Avoid contact with eyes, ears mouth and   genitals (private parts).                       Wash face,  Genitals (private parts) with your normal soap.             6.  Wash thoroughly, paying special attention to the area where your    surgery  will be performed.  7.  Thoroughly rinse your body with warm water from the neck down.  8.  DO NOT shower/wash with your normal soap after using and rinsing off the CHG Soap.                9.  Pat yourself dry with a clean towel.            10.  Wear clean pajamas.            11.  Place clean sheets on your bed the night of your first shower and do not  sleep with pets. Day of Surgery : Do not apply any lotions/deodorants the morning of surgery.  Please wear clean clothes to the hospital/surgery center.  FAILURE TO FOLLOW THESE INSTRUCTIONS MAY RESULT IN THE CANCELLATION OF YOUR SURGERY  PATIENT SIGNATURE_________________________________  NURSE SIGNATURE__________________________________  ________________________________________________________________________   Adam Phenix  An incentive spirometer is a tool that can help keep your lungs clear and active. This tool measures how well you are filling your lungs with each breath. Taking long deep breaths may help reverse or decrease the chance of developing breathing (pulmonary) problems (especially infection) following:  A long period of time when you are unable to move or be active. BEFORE THE PROCEDURE   If the spirometer includes an indicator to show your best effort, your nurse or respiratory therapist will set it to a desired goal.  If possible, sit up straight or lean slightly forward. Try not to slouch.  Hold the incentive spirometer in an upright position. INSTRUCTIONS FOR USE  1. Sit on the edge of your bed if possible, or sit up as far as you can in bed or on a chair. 2. Hold the  incentive spirometer in an upright position. 3. Breathe out normally. 4. Place  the mouthpiece in your mouth and seal your lips tightly around it. 5. Breathe in slowly and as deeply as possible, raising the piston or the ball toward the top of the column. 6. Hold your breath for 3-5 seconds or for as long as possible. Allow the piston or ball to fall to the bottom of the column. 7. Remove the mouthpiece from your mouth and breathe out normally. 8. Rest for a few seconds and repeat Steps 1 through 7 at least 10 times every 1-2 hours when you are awake. Take your time and take a few normal breaths between deep breaths. 9. The spirometer may include an indicator to show your best effort. Use the indicator as a goal to work toward during each repetition. 10. After each set of 10 deep breaths, practice coughing to be sure your lungs are clear. If you have an incision (the cut made at the time of surgery), support your incision when coughing by placing a pillow or rolled up towels firmly against it. Once you are able to get out of bed, walk around indoors and cough well. You may stop using the incentive spirometer when instructed by your caregiver.  RISKS AND COMPLICATIONS  Take your time so you do not get dizzy or light-headed.  If you are in pain, you may need to take or ask for pain medication before doing incentive spirometry. It is harder to take a deep breath if you are having pain. AFTER USE  Rest and breathe slowly and easily.  It can be helpful to keep track of a log of your progress. Your caregiver can provide you with a simple table to help with this. If you are using the spirometer at home, follow these instructions: [place] IF:   You are having difficultly using the spirometer.  You have trouble using the spirometer as often as instructed.  Your pain medication is not giving enough relief while using the spirometer.  You develop fever of 100.5 F (38.1 C) or higher. SEEK IMMEDIATE MEDICAL CARE IF:   You cough up bloody sputum that had not been  present before.  You develop fever of 102 F (38.9 C) or greater.  You develop worsening pain at or near the incision site. MAKE SURE YOU:   Understand these instructions.  Will watch your condition.  Will get help right away if you are not doing well or get worse. Document Released: 02/20/2007 Document Revised: 01/02/2012 Document Reviewed: 04/23/2007 [facility] Patient Information 2014 [place], [place].   ________________________________________________________________________

## 2021-01-29 NOTE — Progress Notes (Addendum)
COVID Vaccine Completed: x1 Date COVID Vaccine completed:  01-21-20 Has received booster: COVID vaccine manufacturer: [employer]   Date of COVID positive in last 90 days:  N/A  PCP - [name], PA-C Cardiologist - Buford Dresser, MD  Chest x-ray - N/A EKG - 08-11-20 Epic Stress Test - 08-18-20 Epic  ECHO - 09-21-20 Epic Cardiac Cath -  Pacemaker/ICD device last checked: Spinal Cord Stimulator:  Sleep Study - N/A CPAP -   Fasting Blood Sugar -  Checks Blood Sugar - Does not check   Blood Thinner Instructions:  Plavix 75 mg .  Last dose 01-30-21  Aspirin Instructions:  ASA 81 mg  Last Dose: 01-30-21  Activity level:  Can go up a flight of stairs and perform activities of daily living without stopping and without symptoms of chest pain or shortness of breath.  Patient does have balance issues.  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  Abnormal stress test.  HTN,. DM  Patient denies shortness of breath, fever, cough and chest pain at PAT appointment   Patient verbalized understanding of instructions that were given to them at the PAT appointment. Patient was also instructed that they will need to review over the PAT instructions again at home before surgery.

## 2021-02-01 ENCOUNTER — Encounter (HOSPITAL_COMMUNITY)
Admission: RE | Admit: 2021-02-01 | Discharge: 2021-02-01 | Disposition: A | Discharge status: Home / Self Care | Payer: 59 | Source: Ambulatory Visit | Attending: Orthopedic Surgery | Admitting: Orthopedic Surgery

## 2021-02-01 ENCOUNTER — Encounter (HOSPITAL_COMMUNITY): Payer: Self-pay

## 2021-02-01 ENCOUNTER — Other Ambulatory Visit: Payer: Self-pay

## 2021-02-01 DIAGNOSIS — Z01812 Encounter for preprocedural laboratory examination: Secondary | ICD-10-CM | POA: Diagnosis not present

## 2021-02-01 HISTORY — DX: Gout, unspecified: M10.9

## 2021-02-01 HISTORY — DX: Pneumonia, unspecified organism: J18.9

## 2021-02-01 HISTORY — DX: Unspecified osteoarthritis, unspecified site: M19.90

## 2021-02-01 HISTORY — DX: Hypothyroidism, unspecified: E03.9

## 2021-02-01 HISTORY — DX: Anxiety disorder, unspecified: F41.9

## 2021-02-01 HISTORY — DX: Cerebral infarction, unspecified: I63.9

## 2021-02-01 LAB — CBC
HCT: 41 % (ref 39.0–52.0)
Hemoglobin: 13.6 g/dL (ref 13.0–17.0)
MCH: 29.3 pg (ref 26.0–34.0)
MCHC: 33.2 g/dL (ref 30.0–36.0)
MCV: 88.4 fL (ref 80.0–100.0)
Platelets: 234 10*3/uL (ref 150–400)
RBC: 4.64 MIL/uL (ref 4.22–5.81)
RDW: 13.5 % (ref 11.5–15.5)
WBC: 11.1 10*3/uL — ABNORMAL HIGH (ref 4.0–10.5)
nRBC: 0 % (ref 0.0–0.2)

## 2021-02-01 LAB — BASIC METABOLIC PANEL
Anion gap: 7 (ref 5–15)
BUN: 19 mg/dL (ref 6–20)
CO2: 22 mmol/L (ref 22–32)
Calcium: 8.7 mg/dL — ABNORMAL LOW (ref 8.9–10.3)
Chloride: 106 mmol/L (ref 98–111)
Creatinine, Ser: 0.96 mg/dL (ref 0.61–1.24)
GFR, Estimated: >60 mL/min (ref >60)
Glucose, Bld: 138 mg/dL — ABNORMAL HIGH (ref 70–99)
Potassium: 4.2 mmol/L (ref 3.5–5.1)
Sodium: 135 mmol/L (ref 135–145)

## 2021-02-01 LAB — HEMOGLOBIN A1C
Hgb A1c MFr Bld: 6.1 % — ABNORMAL HIGH (ref 4.8–5.6)
Mean Plasma Glucose: 128.37 mg/dL

## 2021-02-01 LAB — GLUCOSE, CAPILLARY: Glucose-Capillary: 134 mg/dL — ABNORMAL HIGH (ref 70–99)

## 2021-02-02 ENCOUNTER — Telehealth: Payer: Self-pay | Admitting: Cardiology

## 2021-02-02 ENCOUNTER — Other Ambulatory Visit (HOSPITAL_COMMUNITY)
Admission: RE | Admit: 2021-02-02 | Discharge: 2021-02-02 | Disposition: A | Discharge status: Home / Self Care | Payer: 59 | Source: Ambulatory Visit | Attending: Orthopedic Surgery | Admitting: Orthopedic Surgery

## 2021-02-02 DIAGNOSIS — Z20822 Contact with and (suspected) exposure to covid-19: Secondary | ICD-10-CM | POA: Diagnosis not present

## 2021-02-02 DIAGNOSIS — Z01812 Encounter for preprocedural laboratory examination: Secondary | ICD-10-CM | POA: Insufficient documentation

## 2021-02-02 LAB — SARS CORONAVIRUS 2 (TAT 6-24 HRS): SARS Coronavirus 2: NEGATIVE

## 2021-02-02 NOTE — Telephone Encounter (Signed)
   [facility] Pre-operative Risk Assessment    Request for surgical clearance:  1. What type of surgery is being performed? Left shoulder arthroscopy rotator cuff repair  2. When is this surgery scheduled? 02/04/2021  3. What type of clearance is required (medical clearance vs. Pharmacy clearance to hold med vs. Both)? Medical and Pharmacy  4. Are there any medications that need to be held prior to surgery and how long? Dont know yet  5. Practice name and name of physician performing surgery? [facility] Dr. Tamera Punt  6. What is your office phone number [phone]   7.   What is your office fax number [phone]  8.   Anesthesia type (None, local, MAC, general) ? Choice   Larina Bras 02/02/2021, 3:49 PM  _________________________________________________________________   (provider comments below)

## 2021-02-03 ENCOUNTER — Other Ambulatory Visit: Payer: Self-pay | Admitting: Orthopedic Surgery

## 2021-02-03 ENCOUNTER — Telehealth: Payer: Self-pay | Admitting: Hematology

## 2021-02-03 ENCOUNTER — Ambulatory Visit
Admission: RE | Admit: 2021-02-03 | Discharge: 2021-02-03 | Disposition: A | Discharge status: Home / Self Care | Payer: 59 | Source: Ambulatory Visit | Attending: Neurology | Admitting: Neurology

## 2021-02-03 MED ORDER — IOPAMIDOL (ISOVUE-370) INJECTION 76%
75.0000 mL | Freq: Once | INTRAVENOUS | Status: AC | PRN
  Administered 2021-02-03: 75 mL via INTRAVENOUS

## 2021-02-03 NOTE — Telephone Encounter (Signed)
Agree if he is asymptomatic. His stress test showed likely prior infarct with peri-infarct ischemia. I would continue aspirin throughout the perioperative period. I defer to [facility] on clopidogrel, but in general would minimize interruption of antiplatelets.

## 2021-02-03 NOTE — Telephone Encounter (Addendum)
   Name: Dustin Sampson  DOB: 1966-02-23  MRN: [id number]   Primary Cardiologist: Buford Dresser, MD  Chart reviewed as part of pre-operative protocol coverage. Patient was contacted 02/03/2021 in reference to pre-operative risk assessment for pending surgery as outlined below.  Dustin Sampson was last seen on 11/25/20 by Dr. Harrell Gave.  Since that day, Dustin Sampson. He can complete more than 4.0 METS without angina. However, he has a hx of PAD with recent intervention (on plavix managed by [facility]), and recent abnormal nuclear stress test and echocardiogram. Dr. Harrell Gave and the Dustin Sampson has been managing this medically since he has denied symptoms. I discussed his increased risk for cardiac complications during the perioperative procedure and he is willing to proceed. It is encouraging that he has had recent wrist surgery without cardiac complications.  I reached out to Dr. Harrell Gave since she had discussed heart catheterization at their prior visit:  She has advised that he may proceed with surgery with continuation of ASA. Plavix hold deferred to [facility].    Therefore, based on ACC/AHA guidelines, the patient would be at acceptable risk for the planned procedure without further cardiovascular testing.   The patient was advised that if he develops new symptoms prior to surgery to contact our office to arrange for a follow-up visit, and he verbalized understanding.  I will route this recommendation to the requesting party via Epic fax function and remove from pre-op pool. Please call with questions.   Tami Lin Suzanna Zahn, PA 02/03/2021, 10:42 AM

## 2021-02-03 NOTE — Telephone Encounter (Signed)
Rescheduled upcoming appointment due to provider's PAL. Patient is aware of changes. ?

## 2021-02-04 ENCOUNTER — Other Ambulatory Visit: Payer: Self-pay

## 2021-02-04 ENCOUNTER — Encounter (HOSPITAL_COMMUNITY): Payer: Self-pay | Admitting: Orthopedic Surgery

## 2021-02-04 ENCOUNTER — Encounter (HOSPITAL_COMMUNITY)
Admission: RE | Disposition: A | Discharge status: Home / Self Care | Payer: Self-pay | Source: Ambulatory Visit | Attending: Orthopedic Surgery

## 2021-02-04 ENCOUNTER — Ambulatory Visit (HOSPITAL_COMMUNITY): Payer: 59 | Admitting: Physician Assistant

## 2021-02-04 ENCOUNTER — Ambulatory Visit (HOSPITAL_COMMUNITY)
Admission: RE | Admit: 2021-02-04 | Discharge: 2021-02-04 | Disposition: A | Discharge status: Home / Self Care | Payer: 59 | Source: Ambulatory Visit | Attending: Orthopedic Surgery | Admitting: Orthopedic Surgery

## 2021-02-04 ENCOUNTER — Ambulatory Visit (HOSPITAL_COMMUNITY): Payer: 59 | Admitting: Anesthesiology

## 2021-02-04 DIAGNOSIS — W19XXXA Unspecified fall, initial encounter: Secondary | ICD-10-CM | POA: Insufficient documentation

## 2021-02-04 DIAGNOSIS — Z7984 Long term (current) use of oral hypoglycemic drugs: Secondary | ICD-10-CM | POA: Insufficient documentation

## 2021-02-04 DIAGNOSIS — F17210 Nicotine dependence, cigarettes, uncomplicated: Secondary | ICD-10-CM | POA: Diagnosis not present

## 2021-02-04 DIAGNOSIS — S46012A Strain of muscle(s) and tendon(s) of the rotator cuff of left shoulder, initial encounter: Secondary | ICD-10-CM | POA: Insufficient documentation

## 2021-02-04 DIAGNOSIS — Z7982 Long term (current) use of aspirin: Secondary | ICD-10-CM | POA: Insufficient documentation

## 2021-02-04 DIAGNOSIS — Z79890 Hormone replacement therapy (postmenopausal): Secondary | ICD-10-CM | POA: Insufficient documentation

## 2021-02-04 DIAGNOSIS — Z880 Allergy status to penicillin: Secondary | ICD-10-CM | POA: Insufficient documentation

## 2021-02-04 DIAGNOSIS — Z7902 Long term (current) use of antithrombotics/antiplatelets: Secondary | ICD-10-CM | POA: Insufficient documentation

## 2021-02-04 DIAGNOSIS — Z79899 Other long term (current) drug therapy: Secondary | ICD-10-CM | POA: Insufficient documentation

## 2021-02-04 DIAGNOSIS — Z886 Allergy status to analgesic agent status: Secondary | ICD-10-CM | POA: Diagnosis not present

## 2021-02-04 DIAGNOSIS — M25812 Other specified joint disorders, left shoulder: Secondary | ICD-10-CM | POA: Insufficient documentation

## 2021-02-04 HISTORY — PX: SHOULDER ARTHROSCOPY WITH ROTATOR CUFF REPAIR AND SUBACROMIAL DECOMPRESSION: SHX5686

## 2021-02-04 LAB — GLUCOSE, CAPILLARY
Glucose-Capillary: 142 mg/dL — ABNORMAL HIGH (ref 70–99)
Glucose-Capillary: 138 mg/dL — ABNORMAL HIGH (ref 70–99)

## 2021-02-04 SURGERY — SHOULDER ARTHROSCOPY WITH ROTATOR CUFF REPAIR AND SUBACROMIAL DECOMPRESSION
Anesthesia: General | Laterality: Left

## 2021-02-04 MED ORDER — TIZANIDINE HCL 4 MG PO TABS: 4.0000 mg | ORAL_TABLET | Freq: Three times a day (TID) | ORAL | 1 refills | Status: DC | PRN

## 2021-02-04 MED ORDER — ONDANSETRON HCL 4 MG/2ML IJ SOLN
INTRAMUSCULAR | Status: DC | PRN
  Administered 2021-02-04: 4 mg via INTRAVENOUS

## 2021-02-04 MED ORDER — PHENYLEPHRINE 40 MCG/ML (10ML) SYRINGE FOR IV PUSH (FOR BLOOD PRESSURE SUPPORT)
PREFILLED_SYRINGE | INTRAVENOUS | Status: DC | PRN
  Administered 2021-02-04 (×2): 80 ug via INTRAVENOUS

## 2021-02-04 MED ORDER — PROPOFOL 10 MG/ML IV BOLUS
INTRAVENOUS | Status: DC | PRN
  Administered 2021-02-04: 200 mg via INTRAVENOUS

## 2021-02-04 MED ORDER — ONDANSETRON HCL 4 MG/2ML IJ SOLN
INTRAMUSCULAR | Status: AC
  Filled 2021-02-04: qty 2

## 2021-02-04 MED ORDER — KETOROLAC TROMETHAMINE 30 MG/ML IJ SOLN
30.0000 mg | Freq: Once | INTRAMUSCULAR | Status: AC
  Administered 2021-02-04: 30 mg via INTRAVENOUS

## 2021-02-04 MED ORDER — ROCURONIUM BROMIDE 10 MG/ML (PF) SYRINGE
PREFILLED_SYRINGE | INTRAVENOUS | Status: AC
  Filled 2021-02-04: qty 10

## 2021-02-04 MED ORDER — MEPERIDINE HCL 50 MG/ML IJ SOLN: 6.2500 mg | INTRAMUSCULAR | Status: DC | PRN

## 2021-02-04 MED ORDER — ACETAMINOPHEN 500 MG PO TABS
1000.0000 mg | ORAL_TABLET | Freq: Once | ORAL | Status: AC
  Administered 2021-02-04: 1000 mg via ORAL
  Filled 2021-02-04: qty 2

## 2021-02-04 MED ORDER — HYDROMORPHONE HCL 1 MG/ML IJ SOLN
INTRAMUSCULAR | Status: AC
  Filled 2021-02-04: qty 1

## 2021-02-04 MED ORDER — DEXAMETHASONE SODIUM PHOSPHATE 10 MG/ML IJ SOLN
INTRAMUSCULAR | Status: AC
  Filled 2021-02-04: qty 1

## 2021-02-04 MED ORDER — FENTANYL CITRATE (PF) 100 MCG/2ML IJ SOLN
INTRAMUSCULAR | Status: DC | PRN
  Administered 2021-02-04 (×3): 50 ug via INTRAVENOUS

## 2021-02-04 MED ORDER — MIDAZOLAM HCL 2 MG/2ML IJ SOLN: 0.5000 mg | Freq: Once | INTRAMUSCULAR | Status: DC | PRN

## 2021-02-04 MED ORDER — SODIUM CHLORIDE 0.9 % IR SOLN
Status: DC | PRN
  Administered 2021-02-04: 6000 mL

## 2021-02-04 MED ORDER — LABETALOL HCL 5 MG/ML IV SOLN
INTRAVENOUS | Status: AC
  Filled 2021-02-04: qty 4

## 2021-02-04 MED ORDER — LIDOCAINE 2% (20 MG/ML) 5 ML SYRINGE
INTRAMUSCULAR | Status: AC
  Filled 2021-02-04: qty 5

## 2021-02-04 MED ORDER — FENTANYL CITRATE (PF) 100 MCG/2ML IJ SOLN
50.0000 ug | Freq: Once | INTRAMUSCULAR | Status: AC
Start: 2021-02-04 — End: 2021-02-04
  Administered 2021-02-04: 50 ug via INTRAVENOUS
  Filled 2021-02-04: qty 2

## 2021-02-04 MED ORDER — MIDAZOLAM HCL 2 MG/2ML IJ SOLN
INTRAMUSCULAR | Status: AC
  Filled 2021-02-04: qty 2

## 2021-02-04 MED ORDER — BUPIVACAINE LIPOSOME 1.3 % IJ SUSP
INTRAMUSCULAR | Status: DC | PRN
  Administered 2021-02-04: 10 mL via PERINEURAL

## 2021-02-04 MED ORDER — PROPOFOL 10 MG/ML IV BOLUS
INTRAVENOUS | Status: AC
  Filled 2021-02-04: qty 20

## 2021-02-04 MED ORDER — OXYCODONE HCL 5 MG/5ML PO SOLN: 5.0000 mg | Freq: Once | ORAL | Status: AC | PRN

## 2021-02-04 MED ORDER — SUGAMMADEX SODIUM 500 MG/5ML IV SOLN
INTRAVENOUS | Status: AC
  Filled 2021-02-04: qty 5

## 2021-02-04 MED ORDER — TRANEXAMIC ACID-NACL 1000-0.7 MG/100ML-% IV SOLN
1000.0000 mg | INTRAVENOUS | Status: AC
  Administered 2021-02-04: 1000 mg via INTRAVENOUS
  Filled 2021-02-04: qty 100

## 2021-02-04 MED ORDER — MIDAZOLAM HCL 2 MG/2ML IJ SOLN
1.0000 mg | Freq: Once | INTRAMUSCULAR | Status: AC
  Administered 2021-02-04: 1 mg via INTRAVENOUS
  Filled 2021-02-04: qty 2

## 2021-02-04 MED ORDER — CHLORHEXIDINE GLUCONATE 0.12 % MT SOLN
15.0000 mL | Freq: Once | OROMUCOSAL | Status: AC
  Administered 2021-02-04: 15 mL via OROMUCOSAL

## 2021-02-04 MED ORDER — OXYCODONE HCL 5 MG PO TABS
5.0000 mg | ORAL_TABLET | Freq: Once | ORAL | Status: AC | PRN
  Administered 2021-02-04: 5 mg via ORAL

## 2021-02-04 MED ORDER — SUGAMMADEX SODIUM 500 MG/5ML IV SOLN
INTRAVENOUS | Status: DC | PRN
  Administered 2021-02-04: 300 mg via INTRAVENOUS

## 2021-02-04 MED ORDER — ESMOLOL HCL 100 MG/10ML IV SOLN
INTRAVENOUS | Status: AC
  Filled 2021-02-04: qty 10

## 2021-02-04 MED ORDER — BUPIVACAINE-EPINEPHRINE (PF) 0.5% -1:200000 IJ SOLN
INTRAMUSCULAR | Status: DC | PRN
  Administered 2021-02-04: 10 mL via PERINEURAL

## 2021-02-04 MED ORDER — OXYCODONE HCL 5 MG PO TABS: 5.0000 mg | ORAL_TABLET | Freq: Four times a day (QID) | ORAL | 0 refills | Status: DC | PRN

## 2021-02-04 MED ORDER — FENTANYL CITRATE (PF) 250 MCG/5ML IJ SOLN
INTRAMUSCULAR | Status: AC
  Filled 2021-02-04: qty 5

## 2021-02-04 MED ORDER — ESMOLOL HCL 100 MG/10ML IV SOLN
INTRAVENOUS | Status: DC | PRN
  Administered 2021-02-04: 40 mg via INTRAVENOUS
  Administered 2021-02-04: 30 mg via INTRAVENOUS

## 2021-02-04 MED ORDER — ROCURONIUM BROMIDE 10 MG/ML (PF) SYRINGE
PREFILLED_SYRINGE | INTRAVENOUS | Status: DC | PRN
  Administered 2021-02-04: 60 mg via INTRAVENOUS

## 2021-02-04 MED ORDER — KETOROLAC TROMETHAMINE 30 MG/ML IJ SOLN
INTRAMUSCULAR | Status: AC
  Filled 2021-02-04: qty 1

## 2021-02-04 MED ORDER — OXYCODONE HCL 5 MG PO TABS
ORAL_TABLET | ORAL | Status: AC
  Filled 2021-02-04: qty 1

## 2021-02-04 MED ORDER — LABETALOL HCL 5 MG/ML IV SOLN
INTRAVENOUS | Status: DC | PRN
  Administered 2021-02-04: 5 mg via INTRAVENOUS

## 2021-02-04 MED ORDER — LACTATED RINGERS IV SOLN: INTRAVENOUS | Status: DC

## 2021-02-04 MED ORDER — ORAL CARE MOUTH RINSE: 15.0000 mL | Freq: Once | OROMUCOSAL | Status: AC

## 2021-02-04 MED ORDER — HYDROMORPHONE HCL 1 MG/ML IJ SOLN
0.2500 mg | INTRAMUSCULAR | Status: DC | PRN
  Administered 2021-02-04: 0.5 mg via INTRAVENOUS
  Administered 2021-02-04 (×2): 0.25 mg via INTRAVENOUS
  Administered 2021-02-04: 0.5 mg via INTRAVENOUS
  Administered 2021-02-04 (×2): 0.25 mg via INTRAVENOUS

## 2021-02-04 MED ORDER — PHENYLEPHRINE HCL-NACL 10-0.9 MG/250ML-% IV SOLN
INTRAVENOUS | Status: DC | PRN
  Administered 2021-02-04: 35 ug/min via INTRAVENOUS

## 2021-02-04 MED ORDER — PHENYLEPHRINE HCL (PRESSORS) 10 MG/ML IV SOLN
INTRAVENOUS | Status: AC
  Filled 2021-02-04: qty 1

## 2021-02-04 MED ORDER — PROMETHAZINE HCL 25 MG/ML IJ SOLN: 6.2500 mg | INTRAMUSCULAR | Status: DC | PRN

## 2021-02-04 MED ORDER — VANCOMYCIN HCL IN DEXTROSE 1-5 GM/200ML-% IV SOLN
1000.0000 mg | INTRAVENOUS | Status: AC
  Administered 2021-02-04: 1000 mg via INTRAVENOUS
  Filled 2021-02-04: qty 200

## 2021-02-04 MED ORDER — MIDAZOLAM HCL 5 MG/5ML IJ SOLN
INTRAMUSCULAR | Status: DC | PRN
  Administered 2021-02-04: 1 mg via INTRAVENOUS

## 2021-02-04 MED ORDER — DEXAMETHASONE SODIUM PHOSPHATE 10 MG/ML IJ SOLN
INTRAMUSCULAR | Status: DC | PRN
  Administered 2021-02-04: 8 mg via INTRAVENOUS

## 2021-02-04 SURGICAL SUPPLY — 48 items
AID PSTN UNV HD RSTRNT DISP (MISCELLANEOUS) ×1
ANCH SUT SWLK 19.1X4.75 VT (Anchor) ×4 IMPLANT
ANCHOR PEEK 4.75X19.1 SWLK C (Anchor) ×4 IMPLANT
BOOTIES KNEE HIGH SLOAN (MISCELLANEOUS) ×4 IMPLANT
BURR OVAL 8 FLU 4.0X13 (MISCELLANEOUS) ×2 IMPLANT
CANNULA 5.75X7 CRYSTAL CLEAR (CANNULA) ×2 IMPLANT
CANNULA TWIST IN 8.25X7CM (CANNULA) ×1 IMPLANT
COOLER ICEMAN CLASSIC (MISCELLANEOUS) IMPLANT
COVER SURGICAL LIGHT HANDLE (MISCELLANEOUS) ×2 IMPLANT
CUTTER BONE 4.0MM X 13CM (MISCELLANEOUS) ×2 IMPLANT
DRAPE IMP U-DRAPE 54X76 (DRAPES) ×2 IMPLANT
DRAPE INCISE IOBAN 66X45 STRL (DRAPES) ×1 IMPLANT
DRAPE ORTHO SPLIT 77X108 STRL (DRAPES) ×4
DRAPE STERI 35X30 U-POUCH (DRAPES) ×2 IMPLANT
DRAPE SURG ORHT 6 SPLT 77X108 (DRAPES) ×2 IMPLANT
DRAPE U-SHAPE 47X51 STRL (DRAPES) ×4 IMPLANT
DRSG PAD ABDOMINAL 8X10 ST (GAUZE/BANDAGES/DRESSINGS) ×1 IMPLANT
DURAPREP 26ML APPLICATOR (WOUND CARE) ×2 IMPLANT
GAUZE XEROFORM 1X8 LF (GAUZE/BANDAGES/DRESSINGS) ×1 IMPLANT
GLOVE SRG 8 PF TXTR STRL LF DI (GLOVE) ×1 IMPLANT
GLOVE SURG ENC MOIS LTX SZ7 (GLOVE) ×2 IMPLANT
GLOVE SURG ENC MOIS LTX SZ7.5 (GLOVE) ×2 IMPLANT
GLOVE SURG UNDER POLY LF SZ7 (GLOVE) ×2 IMPLANT
GLOVE SURG UNDER POLY LF SZ8 (GLOVE) ×2
GOWN STRL REUS W/TWL LRG LVL3 (GOWN DISPOSABLE) ×2 IMPLANT
GOWN STRL REUS W/TWL XL LVL3 (GOWN DISPOSABLE) ×2 IMPLANT
KIT BASIN OR (CUSTOM PROCEDURE TRAY) ×2 IMPLANT
KIT PUSHLOCK 2.9 HIP (KITS) IMPLANT
KIT TURNOVER KIT A (KITS) ×2 IMPLANT
MANIFOLD NEPTUNE II (INSTRUMENTS) ×2 IMPLANT
NDL SCORPION MULTI FIRE (NEEDLE) IMPLANT
NEEDLE SCORPION MULTI FIRE (NEEDLE) ×2 IMPLANT
PACK ARTHROSCOPY WL (CUSTOM PROCEDURE TRAY) ×2 IMPLANT
PAD COLD SHLDR WRAP-ON (PAD) IMPLANT
PROBE BIPOLAR ATHRO 135MM 90D (MISCELLANEOUS) ×2 IMPLANT
PROTECTOR NERVE ULNAR (MISCELLANEOUS) ×2 IMPLANT
RESTRAINT HEAD UNIVERSAL NS (MISCELLANEOUS) ×1 IMPLANT
SLING ARM IMMOBILIZER LRG (SOFTGOODS) ×1 IMPLANT
SUPPORT WRAP ARM LG (MISCELLANEOUS) ×2 IMPLANT
SUT ETHILON 3 0 PS 1 (SUTURE) ×2 IMPLANT
SUT PDS AB 1 CT1 27 (SUTURE) IMPLANT
SUT TIGER TAPE 7 IN WHITE (SUTURE) IMPLANT
TAPE LABRALWHITE 1.5X36 (TAPE) IMPLANT
TAPE SUT LABRALTAP WHT/BLK (SUTURE) IMPLANT
TOWEL OR 17X26 10 PK STRL BLUE (TOWEL DISPOSABLE) ×2 IMPLANT
TOWEL OR NON WOVEN STRL DISP B (DISPOSABLE) ×2 IMPLANT
TUBING ARTHROSCOPY IRRIG 16FT (MISCELLANEOUS) ×2 IMPLANT
TUBING CONNECTING 10 (TUBING) ×4 IMPLANT

## 2021-02-04 NOTE — Anesthesia Procedure Notes (Signed)
Anesthesia Regional Block: Interscalene brachial plexus block   Pre-Anesthetic Checklist: ,, timeout performed, Correct Patient, Correct Site, Correct Laterality, Correct Procedure, Correct Position, site marked, Risks and benefits discussed,  Surgical consent,  Pre-op evaluation,  At surgeon's request and post-op pain management  Laterality: Left and Upper  Prep: chloraprep       Needles:  Injection technique: Single-shot  Needle Type: Echogenic Needle     Needle Length: 9cm  Needle Gauge: 21     Additional Needles:   Procedures:,,,, ultrasound used (permanent image in chart),,,,  Narrative:  Start time: 02/04/2021 11:30 AM End time: 02/04/2021 11:37 AM Injection made incrementally with aspirations every 5 mL.  Performed by: Personally  Anesthesiologist: Annye Asa, MD  Additional Notes: Pt identified in Holding room.  Monitors applied. Working IV access confirmed. Sterile prep L clavicle and neck.  #21ga ECHOgenic Arrow block needle to interscalene brachial plexus with US guidance.  10cc 0.5% Bupivacaine with 1:200k epi, Exparel injected incrementally after negative test dose.  Patient asymptomatic, VSS, no heme aspirated, tolerated well.  Jenita Seashore, MD

## 2021-02-04 NOTE — H&P (Signed)
Dustin Sampson is an 55 y.o. male.   Chief Complaint: L shoulder pain and weakness  HPI: s/p fall with L rotator cuff tear.  Past Medical History:  Diagnosis Date  . Anxiety   . Arthritis   . Diabetes mellitus without complication ([place])   . Gout   . Hyperlipemia   . Hypertension   . Hypothyroidism   . Insomnia   . Pneumonia   . Stroke ([place])   . Thyroid disease     Past Surgical History:  Procedure Laterality Date  . ABDOMINAL AORTOGRAM W/LOWER EXTREMITY Bilateral 08/21/2020   Procedure: ABDOMINAL AORTOGRAM W/LOWER EXTREMITY;  Surgeon: Elam Dutch, MD;  Location: [facility];  Service: Cardiovascular;  Laterality: Bilateral;  . ABDOMINAL AORTOGRAM W/LOWER EXTREMITY N/A 09/04/2020   Procedure: ABDOMINAL AORTOGRAM W/LOWER EXTREMITY;  Surgeon: Elam Dutch, MD;  Location: [facility];  Service: Cardiovascular;  Laterality: N/A;  . COLONOSCOPY     x2  . FEMORAL ARTERY EXPLORATION Left 09/04/2020   Procedure: FEMORAL ARTERY EXPLORATION;  Surgeon: Angelia Mould, MD;  Location: [facility];  Service: Vascular;  Laterality: Left;  . NASAL SINUS SURGERY    . PERIPHERAL VASCULAR INTERVENTION Left 08/21/2020   Procedure: PERIPHERAL VASCULAR INTERVENTION;  Surgeon: Elam Dutch, MD;  Location: [facility];  Service: Cardiovascular;  Laterality: Left;  sfa stent x 3  . PERIPHERAL VASCULAR INTERVENTION  09/04/2020   Procedure: PERIPHERAL VASCULAR INTERVENTION;  Surgeon: Elam Dutch, MD;  Location: [facility];  Service: Cardiovascular;;  Right SFA  . WISDOM TOOTH EXTRACTION    . WRIST SURGERY     bilat    Family History  Problem Relation Age of Onset  . Colon cancer Father   . Lung cancer Father   . Atrial fibrillation Mother   . Autoimmune disease Sister   . Healthy Daughter   . Healthy Son    Social History:  reports that he has been smoking cigarettes. He has been smoking about 0.25 packs per day. He has quit using smokeless  tobacco. He reports previous alcohol use. He reports that he does not use drugs.  Allergies:  Allergies  Allergen Reactions  . 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 Avelox [Moxifloxacin Hcl In Nacl] Nausea And Vomiting    "made me real sick"  . Nsaids     Avoid due to kidney issues   . Penicillins Other (See Comments)     severe GI upset Childhood    Medications Prior to Admission  Medication Sig Dispense Refill  . acetaminophen (TYLENOL) 500 MG tablet Take 500-1,000 mg by mouth every 6 (six) hours as needed (for pain.).    Marland Kitchen aspirin EC 81 MG tablet Take 1 tablet (81 mg total) by mouth daily. (Patient taking differently: Take 81 mg by mouth every evening.) 150 tablet 2  . atorvastatin (LIPITOR) 80 MG tablet Take 80 mg by mouth every evening.    . clopidogrel (PLAVIX) 75 MG tablet Take 1 tablet (75 mg total) by mouth daily. 30 tablet 11  . Febuxostat 80 MG TABS Take 80 mg by mouth every evening.    . gabapentin (NEURONTIN) 300 MG capsule Take 300 mg by mouth 3 (three) times daily.    Marland Kitchen levothyroxine (SYNTHROID, LEVOTHROID) 200 MCG tablet Take 200 mcg by mouth every evening.     Marland Kitchen losartan (COZAAR) 100 MG tablet  Take 100 mg by mouth every evening.     . metFORMIN (GLUCOPHAGE-XR) 500 MG 24 hr tablet Take 500 mg by mouth 2 (two) times daily.    . metoprolol succinate (TOPROL-XL) 50 MG 24 hr tablet Take 1 tablet (50 mg total) by mouth daily. Take with or immediately following a meal. 90 tablet 3  . pseudoephedrine (SUDAFED) 120 MG 12 hr tablet Take 120 mg by mouth 2 (two) times daily as needed for congestion.    . tadalafil (CIALIS) 20 MG tablet Take 20 mg by mouth daily as needed for erectile dysfunction.     . temazepam (RESTORIL) 30 MG capsule Take 30 mg by mouth at bedtime.    Marland Kitchen acetaZOLAMIDE (DIAMOX) 125 MG tablet Take 2 tablets (250 mg total) by mouth 2 (two) times daily. (Patient not taking: No sig reported) 120 tablet 5  .  methocarbamol (ROBAXIN) 500 MG tablet Take 1 tablet (500 mg total) by mouth 2 (two) times daily. (Patient not taking: No sig reported) 20 tablet 0    Results for orders placed or performed during the hospital encounter of 02/04/21 (from the past 48 hour(s))  Glucose, capillary     Status: Abnormal   Collection Time: 02/04/21  8:52 AM  Result Value Ref Range   Glucose-Capillary 142 (H) 70 - 99 mg/dL    Comment: Glucose reference range applies only to samples taken after fasting for at least 8 hours.   No results found.  Review of Systems  All other systems reviewed and are negative.   Blood pressure (!) 150/98, pulse 81, temperature 97.9 F (36.6 C), temperature source Oral, resp. rate 18, height 6' (1.829 m), weight 100.7 kg, SpO2 98 %. Physical Exam Constitutional:      Appearance: He is well-developed.  HENT:     Head: Atraumatic.  Pulmonary:     Effort: Pulmonary effort is normal.  Musculoskeletal:     Comments: L shoulder pain with RC testing.   Skin:    General: Skin is warm and dry.  Neurological:     Mental Status: He is alert and oriented to person, place, and time.      Assessment/Plan L rotator cuff tear Plan Arth RCR/SAD Risks / benefits of surgery discussed Consent on chart  NPO for OR Preop antibiotics   Isabella Stalling, MD 02/04/2021, 11:06 AM

## 2021-02-04 NOTE — Anesthesia Procedure Notes (Signed)
Procedure Name: Intubation Performed by: West Pugh, CRNA Pre-anesthesia Checklist: Patient identified, Emergency Drugs available, Suction available, Patient being monitored and Timeout performed Patient Re-evaluated:Patient Re-evaluated prior to induction Oxygen Delivery Method: Circle system utilized Preoxygenation: Pre-oxygenation with 100% oxygen Induction Type: IV induction Ventilation: Mask ventilation without difficulty Laryngoscope Size: Mac and 4 Grade View: Grade II Tube type: Oral Tube size: 8.0 mm Number of attempts: 1 Airway Equipment and Method: Stylet Placement Confirmation: ETT inserted through vocal cords under direct vision,  positive ETCO2,  CO2 detector and breath sounds checked- equal and bilateral Secured at: 22 cm Tube secured with: Tape Dental Injury: Teeth and Oropharynx as per pre-operative assessment

## 2021-02-04 NOTE — Discharge Instructions (Signed)

## 2021-02-04 NOTE — Op Note (Signed)
Procedure(s):   Dustin Sampson male 55 y.o. 02/04/2021   Preoperative diagnosis:  #1 left shoulder rotator cuff tear #2 left shoulder impingement with unfavorable acromial anatomy  Postoperative diagnosis: #1 and 2 same #3 proximal long head biceps tendinopathy  Procedure(s) and Anesthesia Type:    #1 left shoulder arthroscopic rotator cuff repair #2 left shoulder arthroscopic subacromial decompression #3 left shoulder proximal long head biceps tenotomy  Surgeon(s) and Role:    Tania Ade, MD - Primary     Surgeon: Isabella Stalling   Assistants: Jeanmarie Hubert PA-C (Danielle was present and scrubbed throughout the procedure and was essential in positioning, assisting with the camera and instrumentation,, and closure)  Anesthesia: General endotracheal anesthesia with preoperative interscalene block given by the attending anesthesiologist    Procedure Detail  SHOULDER ARTHROSCOPY WITH ROTATOR CUFF REPAIR AND SUBACROMIAL DECOMPRESSION  Estimated Blood Loss: Min         Drains: none  Blood Given: none         Specimens: none        Complications:  * No complications entered in OR log *         Disposition: PACU - hemodynamically stable.         Condition: stable    Procedure:   INDICATIONS FOR SURGERY: The patient is 55 y.o. male who had a fall with subsequent full-thickness rotator cuff tear, indicated for surgical treatment to decrease pain and restore function.  OPERATIVE FINDINGS: Examination under anesthesia: No significant stiffness or instability   DESCRIPTION OF PROCEDURE: The patient was identified in preoperative  holding area where I personally marked the operative site after  verifying site, side, and procedure with the patient. An interscalene block was given by the attending anesthesiologist the holding area.  The patient was taken back to the operating room where general anesthesia was induced without complication and was placed in  the beach-chair position with the back  elevated about 60 degrees and all extremities and head and neck carefully padded and  positioned.   The left upper extremity was then prepped and  draped in a standard sterile fashion. The appropriate time-out  procedure was carried out. The patient did receive IV antibiotics  within 30 minutes of incision.   A small posterior portal incision was made and the arthroscope was introduced into the joint. An anterior portal was then established above the subscapularis using needle localization. Small cannula was placed anteriorly. Diagnostic arthroscopy was then carried out.  The subscapularis was noted to be intact.  The biceps anchor was intact but the biceps tendon was pulled into the joint and noted to have severe tendinopathy involving more than 50% of the thickness of the tendon.  This is felt to be a significant pain generator and therefore through a small anterior incision curved Mayo scissors were used to release the biceps from the superior labrum and it was allowed to retract from the joint.  Glenohumeral joint surfaces were intact without significant chondromalacia.  He was noted to have a full-thickness tear involving the entire insertion of the supraspinatus without significant retraction.  The arthroscope was then introduced into the subacromial space a standard lateral portal was established with needle localization. The shaver was used through the lateral portal to perform extensive bursectomy. Coracoacromial ligament was examined and found to be frayed indicating impingement.  The bursal side of the rotator cuff tear was identified.  The tear edge was debrided back to healthy tendon and the tuberosity was  then debrided down to a bleeding bony surface with the bur to promote healing.  The entire tendon was involved but there was minimal retraction.  The repair was then carried out by placing 2 4.75 peek swivel lock anchors just off the articular  margin preloaded with fiber tape.  These 4 suture strands were then passed evenly throughout the tear and brought over to an additional 2 4.75 peek swivel lock anchors in a crossing suture bridge pattern.  This brought the tendon nicely down over the prepared tuberosity under no tension.  The coracoacromial ligament was taken down off the anterior acromion with the ArthroCare exposing a moderate hooked anterior acromial spur. A high-speed bur was then used through the lateral portal to take down the anterior acromial spur from lateral to medial in a standard acromioplasty.  The acromioplasty was also viewed from the lateral portal and the bur was used as necessary to ensure that the acromion was completely flat from posterior to anterior.  The arthroscopic equipment was removed from the joint and the portals were closed with 3-0 nylon in an interrupted fashion. Sterile dressings were then applied including Xeroform 4 x 4's ABDs and tape. The patient was then allowed to awaken from general anesthesia, placed in a sling, transferred to the stretcher and taken to the recovery room in stable condition.   POSTOPERATIVE PLAN: The patient will be discharged home today and will followup in one week for suture removal and wound check.

## 2021-02-04 NOTE — Progress Notes (Signed)
AssistedDr. Annye Asa with left, ultrasound guided, interscalene  block. Side rails up, monitors on throughout procedure. See vital signs in flow sheet. Tolerated Procedure well.

## 2021-02-04 NOTE — Transfer of Care (Addendum)
Immediate Anesthesia Transfer of Care Note  Patient: Dustin Sampson  Procedure(s) Performed: SHOULDER ARTHROSCOPY WITH ROTATOR CUFF REPAIR AND SUBACROMIAL DECOMPRESSION (Left )  Patient Location: PACU  Anesthesia Type:General  Level of Consciousness: awake, alert , oriented and patient cooperative  Airway & Oxygen Therapy: Patient Spontanous Breathing and Patient connected to face mask oxygen  Post-op Assessment: Report given to RN, Post -op Vital signs reviewed and stable and Patient moving all extremities X 4  Post vital signs: stable  Last Vitals:  Vitals Value Taken Time  BP 136/125 02/04/21 1330  Temp 36.3 C 02/04/21 1328  Pulse 89 02/04/21 1339  Resp 17 02/04/21 1339  SpO2 90 % 02/04/21 1339  Vitals shown include unvalidated device data.  Last Pain:  Vitals:   02/04/21 1328  TempSrc:   PainSc: 0-No pain      Patients Stated Pain Goal: 3 (59/74/71 8550)  Complications: No complications documented.

## 2021-02-04 NOTE — Anesthesia Preprocedure Evaluation (Addendum)
Anesthesia Evaluation  Patient identified by MRN, date of birth, ID band Patient awake    Reviewed: Allergy & Precautions, NPO status , Patient's Chart, lab work & pertinent test results  History of Anesthesia Complications Negative for: history of anesthetic complications  Airway Mallampati: I  TM Distance: >3 FB Neck ROM: Full    Dental  (+) Dental Advisory Given   Pulmonary Current Smoker and Patient abstained from smoking.,  02/02/2021 SARS coronavirus NEG   breath sounds clear to auscultation       Cardiovascular hypertension, Pt. on medications (-) angina+ CAD   Rhythm:Regular Rate:Normal  08/2020 ECHO: EF 45%, inferoseptal and inferior hypokinesis, no significant valvular abnormalities  !!/2022 Stress: Partially reversible inferior/inferoseptal perfusion defect suggestive of infarct with peri-infarct ischemia Mild systolic dysfunction (EF 10%) Intermediate risk study      Neuro/Psych Anxiety CVA, No Residual Symptoms    GI/Hepatic negative GI ROS, Neg liver ROS,   Endo/Other  diabetes (glu 142), Oral Hypoglycemic AgentsHypothyroidism obese  Renal/GU negative Renal ROS     Musculoskeletal  (+) Arthritis ,   Abdominal (+) + obese,   Peds  Hematology negative hematology ROS (+)   Anesthesia Other Findings   Reproductive/Obstetrics                            Anesthesia Physical Anesthesia Plan  ASA: III  Anesthesia Plan: General   Post-op Pain Management: GA combined w/ Regional for post-op pain   Induction: Intravenous  PONV Risk Score and Plan: 1 and Ondansetron and Dexamethasone  Airway Management Planned: Oral ETT  Additional Equipment: None  Intra-op Plan:   Post-operative Plan: Extubation in OR  Informed Consent: I have reviewed the patients History and Physical, chart, labs and discussed the procedure including the risks, benefits and alternatives for the  proposed anesthesia with the patient or authorized representative who has indicated his/her understanding and acceptance.     Dental advisory given  Plan Discussed with: CRNA and Surgeon  Anesthesia Plan Comments: (Plan routine monitors, GETA with Interscalene block for post op analgesia. Pt has had brachial plexus block for wrist surgery in past, and understands arm will be numb for several days, requiring sling protection)       Anesthesia Quick Evaluation

## 2021-02-08 NOTE — Anesthesia Postprocedure Evaluation (Signed)
Anesthesia Post Note  Patient: Dustin Sampson  Procedure(s) Performed: SHOULDER ARTHROSCOPY WITH ROTATOR CUFF REPAIR AND SUBACROMIAL DECOMPRESSION (Left )     Patient location during evaluation: PACU Anesthesia Type: General Level of consciousness: awake and alert, patient cooperative and oriented Pain management: pain level controlled (pt uncomfortable despite Interscalene block, improving with pain medication) Vital Signs Assessment: post-procedure vital signs reviewed and stable Respiratory status: spontaneous breathing, nonlabored ventilation and respiratory function stable Cardiovascular status: blood pressure returned to baseline and stable Postop Assessment: no apparent nausea or vomiting Anesthetic complications: no Comments: Delayed entry, pt eval in PACU post op   No complications documented.  Last Vitals:  Vitals:   02/04/21 1445 02/04/21 1500  BP: (!) 146/85 (!) 141/78  Pulse: 80 81  Resp: 14 12  Temp:  (!) 36 C  SpO2: 93% 93%    Last Pain:  Vitals:   02/04/21 1500  TempSrc:   PainSc: 7                  Cincere Deprey,E. Andoni Busch

## 2021-02-10 ENCOUNTER — Encounter (HOSPITAL_COMMUNITY): Payer: Self-pay | Admitting: Orthopedic Surgery

## 2021-02-10 ENCOUNTER — Other Ambulatory Visit: Payer: 59

## 2021-02-11 ENCOUNTER — Ambulatory Visit: Payer: 59 | Admitting: Hematology

## 2021-02-11 ENCOUNTER — Other Ambulatory Visit: Payer: 59

## 2021-02-12 ENCOUNTER — Ambulatory Visit (INDEPENDENT_AMBULATORY_CARE_PROVIDER_SITE_OTHER): Payer: 59 | Admitting: Physician Assistant

## 2021-02-12 ENCOUNTER — Other Ambulatory Visit: Payer: Self-pay

## 2021-02-12 ENCOUNTER — Ambulatory Visit (INDEPENDENT_AMBULATORY_CARE_PROVIDER_SITE_OTHER)
Admission: RE | Admit: 2021-02-12 | Discharge: 2021-02-12 | Disposition: A | Discharge status: Home / Self Care | Payer: 59 | Source: Ambulatory Visit | Attending: Vascular Surgery | Admitting: Vascular Surgery

## 2021-02-12 ENCOUNTER — Ambulatory Visit (HOSPITAL_COMMUNITY)
Admission: RE | Admit: 2021-02-12 | Discharge: 2021-02-12 | Disposition: A | Discharge status: Home / Self Care | Payer: 59 | Source: Ambulatory Visit | Attending: Vascular Surgery | Admitting: Vascular Surgery

## 2021-02-12 VITALS — BP 129/68 | HR 58 | Temp 98.3°F | Resp 20 | Ht 72.0 in | Wt 220.0 lb

## 2021-02-12 DIAGNOSIS — I739 Peripheral vascular disease, unspecified: Secondary | ICD-10-CM

## 2021-02-12 DIAGNOSIS — F17200 Nicotine dependence, unspecified, uncomplicated: Secondary | ICD-10-CM | POA: Diagnosis not present

## 2021-02-12 NOTE — Progress Notes (Signed)
Office Note     CC:  follow up Requesting Provider:  [name], PA-C  HPI: Dustin Sampson is a 55 y.o. (1966-06-26) male who presents for follow-up of PAD.  He underwent stenting of left SFA on 08/21/2020 by Dr. Oneida Alar.  He also had right SFA stenting on 09/04/2020.  This was complicated by hematoma in his left groin requiring primary repair by Dr. Scot Dock.  He denies any claudication symptoms, rest pain, or nonhealing wounds of bilateral lower extremities.  He is on Plavix, aspirin, and statin daily.  He still smokes about 5 cigarettes a day however is trying to quit.  He is seen today with a sling on his left arm having sustained a fall and rotator cuff tear.   Past Medical History:  Diagnosis Date  . Anxiety   . Arthritis   . Diabetes mellitus without complication ([place])   . Gout   . Hyperlipemia   . Hypertension   . Hypothyroidism   . Insomnia   . Pneumonia   . Stroke ([place])   . Thyroid disease     Past Surgical History:  Procedure Laterality Date  . ABDOMINAL AORTOGRAM W/LOWER EXTREMITY Bilateral 08/21/2020   Procedure: ABDOMINAL AORTOGRAM W/LOWER EXTREMITY;  Surgeon: Elam Dutch, MD;  Location: [facility];  Service: Cardiovascular;  Laterality: Bilateral;  . ABDOMINAL AORTOGRAM W/LOWER EXTREMITY N/A 09/04/2020   Procedure: ABDOMINAL AORTOGRAM W/LOWER EXTREMITY;  Surgeon: Elam Dutch, MD;  Location: [facility];  Service: Cardiovascular;  Laterality: N/A;  . COLONOSCOPY     x2  . FEMORAL ARTERY EXPLORATION Left 09/04/2020   Procedure: FEMORAL ARTERY EXPLORATION;  Surgeon: Angelia Mould, MD;  Location: [facility];  Service: Vascular;  Laterality: Left;  . NASAL SINUS SURGERY    . PERIPHERAL VASCULAR INTERVENTION Left 08/21/2020   Procedure: PERIPHERAL VASCULAR INTERVENTION;  Surgeon: Elam Dutch, MD;  Location: [facility];  Service: Cardiovascular;  Laterality: Left;  sfa stent x 3  . PERIPHERAL VASCULAR INTERVENTION   09/04/2020   Procedure: PERIPHERAL VASCULAR INTERVENTION;  Surgeon: Elam Dutch, MD;  Location: [facility];  Service: Cardiovascular;;  Right SFA  . SHOULDER ARTHROSCOPY WITH ROTATOR CUFF REPAIR AND SUBACROMIAL DECOMPRESSION Left 02/04/2021   Procedure: SHOULDER ARTHROSCOPY WITH ROTATOR CUFF REPAIR AND SUBACROMIAL DECOMPRESSION;  Surgeon: Tania Ade, MD;  Location: [facility];  Service: Orthopedics;  Laterality: Left;  . WISDOM TOOTH EXTRACTION    . WRIST SURGERY     bilat    Social History   Socioeconomic History  . Marital status: Married    Spouse name: Not on file  . Number of children: 2  . Years of education: BS  . Highest education level: Bachelor's degree (e.g., BA, AB, BS)  Occupational History  . Occupation: Arts development officer  Tobacco Use  . Smoking status: Current Every Day Smoker    Packs/day: 0.25    Types: Cigarettes  . Smokeless tobacco: Former Systems developer  . Tobacco comment: 1/2 PACK A DAY  Vaping Use  . Vaping Use: Never used  Substance and Sexual Activity  . Alcohol use: Not Currently  . Drug use: No  . Sexual activity: Not on file  Other Topics Concern  . Not on file  Social History Narrative   Married, lives with wife and two teenage children. He and his sister alternate with their parents living with them every other month. His parents are in their upper 4's. He drinks 4-5 cups of coffee a  day and 2-3 sodas or glasses of tea. He does not get any regular exercise. Right handed   One story home   Social Determinants of Health   Financial Resource Strain: Not on file  Food Insecurity: Not on file  Transportation Needs: Not on file  Physical Activity: Not on file  Stress: Not on file  Social Connections: Not on file  Intimate Partner Violence: Not on file    Family History  Problem Relation Age of Onset  . Colon cancer Father   . Lung cancer Father   . Atrial fibrillation Mother   . Autoimmune disease Sister   . Healthy Daughter   .  Healthy Son     Current Outpatient Medications  Medication Sig Dispense Refill  . acetaminophen (TYLENOL) 500 MG tablet Take 500-1,000 mg by mouth every 6 (six) hours as needed (for pain.).    Marland Kitchen acetaZOLAMIDE (DIAMOX) 125 MG tablet Take 2 tablets (250 mg total) by mouth 2 (two) times daily. 120 tablet 5  . aspirin EC 81 MG tablet Take 1 tablet (81 mg total) by mouth daily. (Patient taking differently: Take 81 mg by mouth every evening.) 150 tablet 2  . atorvastatin (LIPITOR) 80 MG tablet Take 80 mg by mouth every evening.    . clopidogrel (PLAVIX) 75 MG tablet Take 1 tablet (75 mg total) by mouth daily. 30 tablet 11  . Febuxostat 80 MG TABS Take 80 mg by mouth every evening.    . gabapentin (NEURONTIN) 300 MG capsule Take 300 mg by mouth 3 (three) times daily.    Marland Kitchen levothyroxine (SYNTHROID, LEVOTHROID) 200 MCG tablet Take 200 mcg by mouth every evening.     Marland Kitchen losartan (COZAAR) 100 MG tablet Take 100 mg by mouth every evening.     . metFORMIN (GLUCOPHAGE-XR) 500 MG 24 hr tablet Take 500 mg by mouth 2 (two) times daily.    Marland Kitchen oxyCODONE (ROXICODONE) 5 MG immediate release tablet Take 1-2 tablets (5-10 mg total) by mouth every 6 (six) hours as needed for breakthrough pain (max 6/day). 20 tablet 0  . pseudoephedrine (SUDAFED) 120 MG 12 hr tablet Take 120 mg by mouth 2 (two) times daily as needed for congestion.    . tadalafil (CIALIS) 20 MG tablet Take 20 mg by mouth daily as needed for erectile dysfunction.     . temazepam (RESTORIL) 30 MG capsule Take 30 mg by mouth at bedtime.    Marland Kitchen tiZANidine (ZANAFLEX) 4 MG tablet Take 1 tablet (4 mg total) by mouth every 8 (eight) hours as needed for muscle spasms. 30 tablet 1  . metoprolol succinate (TOPROL-XL) 50 MG 24 hr tablet Take 1 tablet (50 mg total) by mouth daily. Take with or immediately following a meal. 90 tablet 3   No current facility-administered medications for this visit.    Allergies  Allergen Reactions  . 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 Avelox [Moxifloxacin Hcl In Nacl] Nausea And Vomiting    "made me real sick"  . Nsaids     Avoid due to kidney issues   . Penicillins Other (See Comments)     severe GI upset Childhood     REVIEW OF SYSTEMS:   [X]  denotes positive finding, [ ]  denotes negative finding Cardiac  Comments:  Chest pain or chest pressure:    Shortness of breath upon exertion:    Short of breath when lying flat:  Irregular heart rhythm:        Vascular    Pain in calf, thigh, or hip brought on by ambulation:    Pain in feet at night that wakes you up from your sleep:     Blood clot in your veins:    Leg swelling:         Pulmonary    Oxygen at home:    Productive cough:     Wheezing:         Neurologic    Sudden weakness in arms or legs:     Sudden numbness in arms or legs:     Sudden onset of difficulty speaking or slurred speech:    Temporary loss of vision in one eye:     Problems with dizziness:         Gastrointestinal    Blood in stool:     Vomited blood:         Genitourinary    Burning when urinating:     Blood in urine:        Psychiatric    Major depression:         Hematologic    Bleeding problems:    Problems with blood clotting too easily:        Skin    Rashes or ulcers:        Constitutional    Fever or chills:      PHYSICAL EXAMINATION:  Vitals:   02/12/21 1409  BP: 129/68  Pulse: (!) 58  Resp: 20  Temp: 98.3 F (36.8 C)  TempSrc: Temporal  SpO2: 98%  Height: 6' (1.829 m)    General:  WDWN in NAD; vital signs documented above Gait: Not observed HENT: WNL, normocephalic Pulmonary: normal non-labored breathing Cardiac: regular HR Abdomen: soft, NT, no masses Skin: without rashes Vascular Exam/Pulses:  Right Left  Radial 2+ (normal) 2+ (normal)  DP 2+ (normal) 2+ (normal)   Extremities: without ischemic changes, without Gangrene , without cellulitis; without open wounds;   Musculoskeletal: no muscle wasting or atrophy  Neurologic: A&O X 3;  No focal weakness or paresthesias are detected Psychiatric:  The pt has Normal affect.   Non-Invasive Vascular Imaging:   Bilateral SFA stents widely patent  Bilateral ABIs within normal limits   ASSESSMENT/PLAN:: 55 y.o. male here for surveillance of PAD and recent bilateral SFA stents  -Bilateral lower extremities are well perfused with palpable DP pulses and widely patent SFA stents based on arterial duplex -Continue aspirin, Plavix, statin daily; okay to stop Plavix for 7 days in anticipation of upcoming surgery with ENT -Encourage smoking cessation -Recheck bilateral arterial duplex and ABIs in 1 year   Dagoberto Ligas, PA-C [facility] [phone]  Clinic MD:   Stanford Breed on call

## 2021-02-17 ENCOUNTER — Ambulatory Visit: Payer: 59 | Admitting: Hematology

## 2021-02-22 ENCOUNTER — Ambulatory Visit: Payer: 59 | Admitting: Cardiology

## 2021-02-22 ENCOUNTER — Other Ambulatory Visit: Payer: Self-pay

## 2021-02-25 ENCOUNTER — Other Ambulatory Visit (INDEPENDENT_AMBULATORY_CARE_PROVIDER_SITE_OTHER): Payer: Self-pay | Admitting: Otolaryngology

## 2021-02-28 NOTE — Progress Notes (Deleted)
Cardiology Office Note:    Date:  02/28/2021   ID:  Dustin Sampson, DOB 1966/06/11, MRN [id number]  PCP:  [name], PA-C  Cardiologist:  Buford Dresser, MD  Electrophysiologist:  None   Referring MD: [name], PA-C   Chief Complaint: routine follow-up of presumed ischemic cardiomyopathy   History of Present Illness:    Dustin Sampson is a 55 y.o. male with a history of abnormal Myoview and presumed ischemic cardiomyopathy with EF of 45% being treated medically, PAD with claudication s/p stenting to left SFA in 07/2020 and then stenting to right SFA in 08/2020, prior CVA, hypertension, hyperlipidemia, type 2 diabetes mellitus, hypothyroidism, MGUS, and continued tobacco abuse who is followed by Dr. Harrell Gave and presents today for routine follow-up of presumed ischemic cardiomyopathy.  Patient referred to Dr. Harrell Gave in 07/2020 for evaluation and management of risk for CAD given multiple risk factors including history of PAD and stroke. At that visit, she reported rare episodes of chest tightness worse when laying down at night. Lexiscan Myoview was ordered for further evaluation and showed a partially reversible inferior/inferseptal perfusion defect suggestive of infarct with peri-infarct ischemia (considered intermediate risk study). Echo was then ordered for further evaluation and showed LVEF of 45% with hypokinesis of the inferoseptal and inferior walls as well as grade 1 diastolic dysfunction. He was started on Toprol-XL given reduced EF. Patient was last seen by Dr. Harrell Gave in 11/2019 at which time he noted mild shortness of breath with exertion but denied any significant chest pain since starting Toprol-XL. At that visit, Dr. Harrell Gave did have discussion with patient about medical management vs cardiac catheterization given stress test and Echo results and suspicion for ischemic cardiomyopathy. Patient was feeling well and therefore preferred to continue medical  management for the time being.  Patient presents today for follow-up. ***  Abnormal Myoview Presumed CAD - Myoview in 07/2020 was intermediate risk and showed artially reversible inferior/inferseptal perfusion defect suggestive of infarct with peri-infarct ischemia. Echo in 08/2020 showed LVEF of 45% with hypokinesis of the inferoseptal and inferior walls as well as grade 1 diastolic dysfunction. Cardiac catheterization offered at last visit but patient wanted to continue with medical therapy.  - No angina.  - On DAPT with Aspirin and Plavix given PAD. - Continue beta-blocker and high-intensity statin.   Presumed Ischemic Cardiomyopathy - Echo as above. - Appears euvolemic on exam.  - *** - Continue Losartan 100mg  daily. - Continue Toprol-XL 50mg  daily.  - Discussed importance of daily weight and sodium/fluid restrictions. ***  PAD with History of Claudication - s/p stenting to left SFA in 07/2020 and then stenting to right SFA in 08/2020. Most recent dopplers/ABI in 01/2021 patent stents and normal ABIs bilaterally.  - *** - On DAPT with Aspirin and Plavix.  - Continue high-intensity statin. - Followed by Vascular Surgery.  Hypertension - *** - Continue Losartan and Toprol-XL as above.  Hyperlipidemia - *** - Continue Lipitor 80mg  daily.  Type 2 Diabetes Mellitus - On Metformin at home.  - Management per PCP.  Tobacco Abuse  - ***  Past Medical History:  Diagnosis Date  . Anxiety   . Arthritis   . Diabetes mellitus without complication ([place])   . Gout   . Hyperlipemia   . Hypertension   . Hypothyroidism   . Insomnia   . Pneumonia   . Stroke ([place])   . Thyroid disease     Past Surgical History:  Procedure Laterality Date  . ABDOMINAL AORTOGRAM W/LOWER EXTREMITY  Bilateral 08/21/2020   Procedure: ABDOMINAL AORTOGRAM W/LOWER EXTREMITY;  Surgeon: Elam Dutch, MD;  Location: [facility];  Service: Cardiovascular;  Laterality: Bilateral;  . ABDOMINAL  AORTOGRAM W/LOWER EXTREMITY N/A 09/04/2020   Procedure: ABDOMINAL AORTOGRAM W/LOWER EXTREMITY;  Surgeon: Elam Dutch, MD;  Location: [facility];  Service: Cardiovascular;  Laterality: N/A;  . COLONOSCOPY     x2  . FEMORAL ARTERY EXPLORATION Left 09/04/2020   Procedure: FEMORAL ARTERY EXPLORATION;  Surgeon: Angelia Mould, MD;  Location: [facility];  Service: Vascular;  Laterality: Left;  . NASAL SINUS SURGERY    . PERIPHERAL VASCULAR INTERVENTION Left 08/21/2020   Procedure: PERIPHERAL VASCULAR INTERVENTION;  Surgeon: Elam Dutch, MD;  Location: [facility];  Service: Cardiovascular;  Laterality: Left;  sfa stent x 3  . PERIPHERAL VASCULAR INTERVENTION  09/04/2020   Procedure: PERIPHERAL VASCULAR INTERVENTION;  Surgeon: Elam Dutch, MD;  Location: [facility];  Service: Cardiovascular;;  Right SFA  . SHOULDER ARTHROSCOPY WITH ROTATOR CUFF REPAIR AND SUBACROMIAL DECOMPRESSION Left 02/04/2021   Procedure: SHOULDER ARTHROSCOPY WITH ROTATOR CUFF REPAIR AND SUBACROMIAL DECOMPRESSION;  Surgeon: Tania Ade, MD;  Location: [facility];  Service: Orthopedics;  Laterality: Left;  . WISDOM TOOTH EXTRACTION    . WRIST SURGERY     bilat    Current Medications: No outpatient medications have been marked as taking for the 03/04/21 encounter (Appointment) with Darreld Mclean, PA-C.     Allergies:   Aspirin, Avelox [moxifloxacin hcl in nacl], Nsaids, and Penicillins   Social History   Socioeconomic History  . Marital status: Married    Spouse name: Not on file  . Number of children: 2  . Years of education: BS  . Highest education level: Bachelor's degree (e.g., BA, AB, BS)  Occupational History  . Occupation: Arts development officer  Tobacco Use  . Smoking status: Current Every Day Smoker    Packs/day: 0.25    Types: Cigarettes  . Smokeless tobacco: Former Systems developer  . Tobacco comment: 1/2 PACK A DAY  Vaping Use  . Vaping Use: Never used  Substance and  Sexual Activity  . Alcohol use: Not Currently  . Drug use: No  . Sexual activity: Not on file  Other Topics Concern  . Not on file  Social History Narrative   Married, lives with wife and two teenage children. He and his sister alternate with their parents living with them every other month. His parents are in their upper 45's. He drinks 4-5 cups of coffee a day and 2-3 sodas or glasses of tea. He does not get any regular exercise. Right handed   One story home   Social Determinants of Health   Financial Resource Strain: Not on file  Food Insecurity: Not on file  Transportation Needs: Not on file  Physical Activity: Not on file  Stress: Not on file  Social Connections: Not on file     Family History: The patient's family history includes Atrial fibrillation in his mother; Autoimmune disease in his sister; Colon cancer in his father; Healthy in his daughter and son; Lung cancer in his father.  ROS:   Please see the history of present illness.     EKGs/Labs/Other Studies Reviewed:    The following studies were reviewed today:  Lexiscan Myoview 08/18/2020:  There was no ST segment deviation noted during stress.  The left ventricular ejection fraction is mildly decreased (45-54%).  Nuclear stress EF: 45%.  Defect 1: There  is a medium defect of moderate severity present in the basal inferoseptal, basal inferior, mid inferoseptal, mid inferior and apical inferior location.  Findings consistent with prior myocardial infarction with peri-infarct ischemia.  This is an intermediate risk study.   1. Partially reversible inferior/inferoseptal perfusion defect suggestive of infarct with peri-infarct ischemia 2. Mild systolic dysfunction (EF 87%) 3. Intermediate risk study _______________  Echocardiogram 09/21/2020: Impressions: 1. Left ventricular ejection fraction, by estimation, is 45%. The left  ventricle has mildly decreased function. The left ventricle demonstrates   regional wall motion abnormalities with inferoseptal and inferior  hypokinesis. Left ventricular diastolic  parameters are consistent with Grade I diastolic dysfunction (impaired  relaxation).  2. Right ventricular systolic function is normal. The right ventricular  size is normal. Tricuspid regurgitation signal is inadequate for assessing  PA pressure.  3. The mitral valve is normal in structure. No evidence of mitral valve  regurgitation. No evidence of mitral stenosis.  4. The aortic valve is tricuspid. Aortic valve regurgitation is not  visualized. No aortic stenosis is present.  5. The inferior vena cava is normal in size with greater than 50%  respiratory variability, suggesting right atrial pressure of 3 mmHg.   EKG:  EKG not ordered today.   Recent Labs: 01/19/2021: ALT 18; TSH 2.61 02/01/2021: BUN 19; Creatinine, Ser 0.96; Hemoglobin 13.6; Platelets 234; Potassium 4.2; Sodium 135  Recent Lipid Panel No results found for: CHOL, TRIG, HDL, CHOLHDL, VLDL, LDLCALC, LDLDIRECT  Physical Exam:    Vital Signs: There were no vitals taken for this visit.    Wt Readings from Last 3 Encounters:  02/12/21 220 lb (99.8 kg)  02/04/21 222 lb 0.1 oz (100.7 kg)  02/01/21 222 lb (100.7 kg)     General: 55 y.o. male in no acute distress. HEENT: Normocephalic and atraumatic. Sclera clear. EOMs intact. Neck: Supple. No carotid bruits. No JVD. Heart: *** RRR. Distinct S1 and S2. No murmurs, gallops, or rubs. Radial and distal pedal pulses 2+ and equal bilaterally. Lungs: No increased work of breathing. Clear to ausculation bilaterally. No wheezes, rhonchi, or rales.  Abdomen: Soft, non-distended, and non-tender to palpation. Bowel sounds present in all 4 quadrants.  MSK: Normal strength and tone for age. *** Extremities: No lower extremity edema.    Skin: Warm and dry. Neuro: Alert and oriented x3. No focal deficits. Psych: Normal affect. Responds appropriately.   Assessment:     No diagnosis found.  Plan:     Disposition: Follow up in ***   Medication Adjustments/Labs and Tests Ordered: Current medicines are reviewed at length with the patient today.  Concerns regarding medicines are outlined above.  No orders of the defined types were placed in this encounter.  No orders of the defined types were placed in this encounter.   There are no Patient Instructions on file for this visit.   Signed, Darreld Mclean, PA-C  02/28/2021 8:02 PM    [facility]

## 2021-03-02 NOTE — Progress Notes (Signed)
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 imbalance issues. -Advised pt his labs have dropped off and we need to get the MMP. The pt will get this done with his next labs drawn. Will provide pt with paper prescription of what labs are needed. -Recommended pt use cane or walking stick to reduce risk of falls. 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 -Recommend pt f/u with PCP to keep DM II and HTN well-controlled  -Will see back in 11 months with labs 1 week prior.   FOLLOW UP: RTC w Dr Irene Limbo in 11 months. Please schedule labs 1 week  prior.   The total time spent in the appt was 20 minutes and more than 50% was on counseling and direct patient cares.  All of the patient's questions were answered with apparent satisfaction. The patient knows to call the clinic with any problems, questions or concerns.     Sullivan Lone MD [place] AAHIVMS [facility] Hematology/Oncology Physician [facility]  (Office):       [phone] (Work cell):  [phone] (Fax):           [phone]  03/03/2021 3:43 PM  I, Reinaldo Raddle, am acting as scribe for Dr. Sullivan Lone, MD.    .I have reviewed the above documentation for accuracy and completeness, and I agree with the above.  Sullivan Lone MD [place] AAHIVMS [facility] Hematology/Oncology Physician [place]

## 2021-03-03 ENCOUNTER — Other Ambulatory Visit: Payer: Self-pay

## 2021-03-03 ENCOUNTER — Inpatient Hospital Stay (HOSPITAL_BASED_OUTPATIENT_CLINIC_OR_DEPARTMENT_OTHER): Payer: 59 | Admitting: Hematology

## 2021-03-03 ENCOUNTER — Inpatient Hospital Stay: Payer: 59 | Attending: Hematology

## 2021-03-03 VITALS — BP 143/90 | HR 110 | Temp 97.6°F | Resp 17 | Ht 72.0 in | Wt 215.0 lb

## 2021-03-03 DIAGNOSIS — Z800 Family history of malignant neoplasm of digestive organs: Secondary | ICD-10-CM | POA: Insufficient documentation

## 2021-03-03 DIAGNOSIS — I10 Essential (primary) hypertension: Secondary | ICD-10-CM | POA: Diagnosis not present

## 2021-03-03 DIAGNOSIS — F17210 Nicotine dependence, cigarettes, uncomplicated: Secondary | ICD-10-CM | POA: Diagnosis not present

## 2021-03-03 DIAGNOSIS — Z801 Family history of malignant neoplasm of trachea, bronchus and lung: Secondary | ICD-10-CM | POA: Insufficient documentation

## 2021-03-03 DIAGNOSIS — E119 Type 2 diabetes mellitus without complications: Secondary | ICD-10-CM | POA: Diagnosis not present

## 2021-03-03 DIAGNOSIS — R809 Proteinuria, unspecified: Secondary | ICD-10-CM | POA: Insufficient documentation

## 2021-03-03 DIAGNOSIS — D472 Monoclonal gammopathy: Secondary | ICD-10-CM | POA: Insufficient documentation

## 2021-03-03 LAB — CBC WITH DIFFERENTIAL/PLATELET
Abs Immature Granulocytes: 0.03 10*3/uL (ref 0.00–0.07)
Basophils Absolute: 0.1 10*3/uL (ref 0.0–0.1)
Basophils Relative: 1 %
Eosinophils Absolute: 0.3 10*3/uL (ref 0.0–0.5)
Eosinophils Relative: 3 %
HCT: 41.3 % (ref 39.0–52.0)
Hemoglobin: 14.3 g/dL (ref 13.0–17.0)
Immature Granulocytes: 0 %
Lymphocytes Relative: 18 %
Lymphs Abs: 2.1 10*3/uL (ref 0.7–4.0)
MCH: 29.5 pg (ref 26.0–34.0)
MCHC: 34.6 g/dL (ref 30.0–36.0)
MCV: 85.3 fL (ref 80.0–100.0)
Monocytes Absolute: 0.8 10*3/uL (ref 0.1–1.0)
Monocytes Relative: 7 %
Neutro Abs: 8.6 10*3/uL — ABNORMAL HIGH (ref 1.7–7.7)
Neutrophils Relative %: 71 %
Platelets: 337 10*3/uL (ref 150–400)
RBC: 4.84 MIL/uL (ref 4.22–5.81)
RDW: 12.4 % (ref 11.5–15.5)
WBC: 11.9 10*3/uL — ABNORMAL HIGH (ref 4.0–10.5)
nRBC: 0 % (ref 0.0–0.2)

## 2021-03-04 ENCOUNTER — Telehealth: Payer: Self-pay | Admitting: Hematology

## 2021-03-04 ENCOUNTER — Ambulatory Visit: Payer: 59 | Admitting: Student

## 2021-03-04 NOTE — Telephone Encounter (Signed)
Scheduled follow-up appointments per 5/11 los. Patient is aware. ?

## 2021-03-25 ENCOUNTER — Other Ambulatory Visit: Payer: Self-pay | Admitting: Family Medicine

## 2021-03-25 DIAGNOSIS — R911 Solitary pulmonary nodule: Secondary | ICD-10-CM

## 2021-03-25 DIAGNOSIS — IMO0001 Reserved for inherently not codable concepts without codable children: Secondary | ICD-10-CM

## 2021-04-16 ENCOUNTER — Telehealth: Payer: Self-pay | Admitting: Neurology
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
## 2021-04-19 ENCOUNTER — Ambulatory Visit
Admission: RE | Admit: 2021-04-19 | Discharge: 2021-04-19 | Disposition: A | Discharge status: Home / Self Care | Payer: 59 | Source: Ambulatory Visit | Attending: Family Medicine | Admitting: Family Medicine

## 2021-04-19 DIAGNOSIS — IMO0001 Reserved for inherently not codable concepts without codable children: Secondary | ICD-10-CM

## 2021-04-19 NOTE — Telephone Encounter (Signed)
Called patient and informed him of Dr. Georgie Chard recommendations on seeking a second opinion. Patient would like to know who Dr. Tomi Likens recommends.  Informed patient that I would send a message and give him a call back as soon as I hear back.

## 2021-04-19 NOTE — Telephone Encounter (Signed)
Called patient and informed him that Dr. Tomi Likens does not have any recommendations, however patient can reach out to his PCP. Patient verbalized understanding and will reach out to PCP.   No further questions or concerns from patient.

## 2021-04-25 NOTE — Progress Notes (Signed)
Cardiology Office Note:    Date:  05/03/2021   ID:  Lamonte Richer, DOB [birth year], MRN [id number]  PCP:  [name], PA-C  Cardiologist:  Buford Dresser, MD  Electrophysiologist:  None   Referring MD: [name], PA-C   Chief Complaint: follow-up of presumed CAD and ischemic cardiomyopathy  History of Present Illness:    Dustin Sampson is a 55 y.o. male with a history of presumed CAD and ischemic cardiomyopathy with abnormal nuclear stress test in 07/2020, PAD with claudication s/p stenting of bilateral SFAs in 08/2020, CVA by MRI, hypertension, hyperlipidemia, type 2 diabetes mellitus, hypothyroidism, MGUS, and tobacco abuse who is followed by Dr. Harrell Gave and presents today for routine follow-up.  Patient was referred to Dr. Harrell Gave in 07/2020 for further evaluation/management of CAD risk given multiple cardiovascular risk factors including PAD with bilateral ABI of 0.6 in 07/2020 and prior stroke noted on MRI. At that visit, he noted rare chest pain at rest (worse at night when laying down to sleep). Chest pain felt to be atypical. However, he was planning on having peripheral angiography for further management of PAD. Therefore, nuclear stress test was ordered for further evaluation and was intermediate risk with partially reversible inferior/inferoseptal perfusion defect suggestive of infarct with peri-infarct ischemia. EF was 45%. He was felt to be OK for peripheral angiogram but Echo was recommended for further evaluation of LV function. Patient underwent peripheral angiography with placement of bilateral SFA stents by Dr. Oneida Alar in 08/2020. Echo in 08/2020 confirmed LVEF of 45% with hypokinesis of inferoseptal and inferior walls as well as grade 1 diastolic dysfunction. He was started on Toprol-XL in addition to Losartan which he was already taking. Patient was last seen by Dr. Harrell Gave in 11/2020 at which time he had not had any significant chest pain since starting  Toprol-XL. Cardiomyopathy was felt to be ischemic in nature. Medical management vs cardiac catheterization was discussed. Patient was feeling well on current medications and preferred to continue with medical management.  Patient presents today for follow-up. Here alone. Patient doing well since last visit. He notes occasional mild chest pain which is rare and only occurs "once in a blue moon" but otherwise no significant chest pain. He also states that "every once in a while," he has some shortness of breath at night. He states it is hard for him to lay on his back but he is able to lay on a flat surface on his side. No PND. No lower extremity edema. He has actually lost weight recently. He has not been as active usual since his shoulder and neck surgery over the last several months. However, he went back to work this week. No shortness of breath or chest pain at work or when walking. No palpitations. He notes occasional dizziness but this is rare. No syncope. No abnormal bleeding on DAPT.  Of note, patient states he has been having a lot of balance issues. He has been seen by Neurology but work-up has been unremarkable so far. He states he father had a similar problem and they never found the reason why.  Past Medical History:  Diagnosis Date   Anxiety    Arthritis    Diabetes mellitus without complication ([place])    Gout    Hyperlipemia    Hypertension    Hypothyroidism    Insomnia    Pneumonia    Stroke [facility])    Thyroid disease     Past Surgical History:  Procedure Laterality Date   ABDOMINAL  AORTOGRAM W/LOWER EXTREMITY Bilateral 08/21/2020   Procedure: ABDOMINAL AORTOGRAM W/LOWER EXTREMITY;  Surgeon: Elam Dutch, MD;  Location: [place] CV LAB;  Service: Cardiovascular;  Laterality: Bilateral;   ABDOMINAL AORTOGRAM W/LOWER EXTREMITY N/A 09/04/2020   Procedure: ABDOMINAL AORTOGRAM W/LOWER EXTREMITY;  Surgeon: Elam Dutch, MD;  Location: [place] [facility];  Service:  Cardiovascular;  Laterality: N/A;   COLONOSCOPY     x2   FEMORAL ARTERY EXPLORATION Left 09/04/2020   Procedure: FEMORAL ARTERY EXPLORATION;  Surgeon: Angelia Mould, MD;  Location: [facility] OR;  Service: Vascular;  Laterality: Left;   NASAL SINUS SURGERY     PERIPHERAL VASCULAR INTERVENTION Left 08/21/2020   Procedure: PERIPHERAL VASCULAR INTERVENTION;  Surgeon: Elam Dutch, MD;  Location: [place] CV LAB;  Service: Cardiovascular;  Laterality: Left;  sfa stent x 3   PERIPHERAL VASCULAR INTERVENTION  09/04/2020   Procedure: PERIPHERAL VASCULAR INTERVENTION;  Surgeon: Elam Dutch, MD;  Location: [place] CV LAB;  Service: Cardiovascular;;  Right SFA   SHOULDER ARTHROSCOPY WITH ROTATOR CUFF REPAIR AND SUBACROMIAL DECOMPRESSION Left 02/04/2021   Procedure: SHOULDER ARTHROSCOPY WITH ROTATOR CUFF REPAIR AND SUBACROMIAL DECOMPRESSION;  Surgeon: Tania Ade, MD;  Location: [facility];  Service: Orthopedics;  Laterality: Left;   WISDOM TOOTH EXTRACTION     WRIST SURGERY     bilat    Current Medications: Current Meds  Medication Sig   acetaminophen (TYLENOL) 500 MG tablet Take 500-1,000 mg by mouth every 6 (six) hours as needed (for pain.).   aspirin EC 81 MG tablet Take 1 tablet (81 mg total) by mouth daily. (Patient taking differently: Take 81 mg by mouth every evening.)   atorvastatin (LIPITOR) 80 MG tablet Take 80 mg by mouth every evening.   clopidogrel (PLAVIX) 75 MG tablet Take 1 tablet (75 mg total) by mouth daily.   Febuxostat 80 MG TABS Take 80 mg by mouth every evening.   gabapentin (NEURONTIN) 300 MG capsule Take 300 mg by mouth 3 (three) times daily.   levothyroxine (SYNTHROID, LEVOTHROID) 200 MCG tablet Take 200 mcg by mouth every evening.    losartan (COZAAR) 100 MG tablet Take 100 mg by mouth every evening.    metFORMIN (GLUCOPHAGE-XR) 500 MG 24 hr tablet Take 500 mg by mouth 2 (two) times daily.   metoprolol succinate (TOPROL-XL) 50 MG 24 hr tablet Take 1  tablet (50 mg total) by mouth daily. Take with or immediately following a meal.   pseudoephedrine (SUDAFED) 120 MG 12 hr tablet Take 120 mg by mouth 2 (two) times daily as needed for congestion.   tadalafil (CIALIS) 20 MG tablet Take 20 mg by mouth daily as needed for erectile dysfunction.    temazepam (RESTORIL) 30 MG capsule Take 30 mg by mouth at bedtime.   tiZANidine (ZANAFLEX) 4 MG tablet Take 1 tablet (4 mg total) by mouth every 8 (eight) hours as needed for muscle spasms.     Allergies:   Aspirin, Avelox [moxifloxacin hcl in nacl], Nsaids, and Penicillins   Social History   Socioeconomic History   Marital status: Married    Spouse name: Not on file   Number of children: 2   Years of education: BS   Highest education level: Bachelor's degree (e.g., BA, AB, BS)  Occupational History   Occupation: Arts development officer  Tobacco Use   Smoking status: Every Day    Packs/day: 0.25    Pack years: 0.00    Types: Cigarettes   Smokeless tobacco: Former  Tobacco comments:    1/2 PACK A DAY  Vaping Use   Vaping Use: Never used  Substance and Sexual Activity   Alcohol use: Not Currently   Drug use: No   Sexual activity: Not on file  Other Topics Concern   Not on file  Social History Narrative   Married, lives with wife and two teenage children. He and his sister alternate with their parents living with them every other month. His parents are in their upper 28's. He drinks 4-5 cups of coffee a day and 2-3 sodas or glasses of tea. He does not get any regular exercise. Right handed   One story home   Social Determinants of Health   Financial Resource Strain: Not on file  Food Insecurity: Not on file  Transportation Needs: Not on file  Physical Activity: Not on file  Stress: Not on file  Social Connections: Not on file     Family History: The patient's family history includes Atrial fibrillation in his mother; Autoimmune disease in his sister; Colon cancer in his father; Healthy  in his daughter and son; Lung cancer in his father.  ROS:   Please see the history of present illness.     EKGs/Labs/Other Studies Reviewed:    The following studies were reviewed today:  Lexiscan Myoview 08/18/2020: There was no ST segment deviation noted during stress. The left ventricular ejection fraction is mildly decreased (45-54%). Nuclear stress EF: 45%. Defect 1: There is a medium defect of moderate severity present in the basal inferoseptal, basal inferior, mid inferoseptal, mid inferior and apical inferior location. Findings consistent with prior myocardial infarction with peri-infarct ischemia. This is an intermediate risk study.   1. Partially reversible inferior/inferoseptal perfusion defect suggestive of infarct with peri-infarct ischemia 2. Mild systolic dysfunction (EF 52%) 3. Intermediate risk study _______________  Echocardiogram 09/21/2020: Impressions:  1. Left ventricular ejection fraction, by estimation, is 45%. The left  ventricle has mildly decreased function. The left ventricle demonstrates  regional wall motion abnormalities with inferoseptal and inferior  hypokinesis. Left ventricular diastolic  parameters are consistent with Grade I diastolic dysfunction (impaired  relaxation).   2. Right ventricular systolic function is normal. The right ventricular  size is normal. Tricuspid regurgitation signal is inadequate for assessing  PA pressure.   3. The mitral valve is normal in structure. No evidence of mitral valve  regurgitation. No evidence of mitral stenosis.   4. The aortic valve is tricuspid. Aortic valve regurgitation is not  visualized. No aortic stenosis is present.   5. The inferior vena cava is normal in size with greater than 50%  respiratory variability, suggesting right atrial pressure of 3 mmHg.  _______________  Lower Extremity Arterial Ultrasounds/ABIs 02/12/2021: Summary: - Right: Patent stent with no evidence of stenosis in the  proximal  superficial femoral artery to distal superficial femoral artery.  - Left: Patent stent with no evidence of stenosis in the proximal  superficial femoral artery to distal superficial femoral artery.   - Right: Resting right ankle-brachial index is within normal range. The  right toe-brachial index is normal. RT great toe pressure = 135 mmHg.  - Left: Resting left ankle-brachial index is within normal range. The left  toe-brachial index is normal. LT Great toe pressure = 138 mmHg.  EKG:  EKG ordered today. EKG personally reviewed and demonstrates normal sinus rhythm, rate 72 bpm, with PVC and no acute ST/T changes. Normal axis. QTc 459 ms.  Recent Labs: 01/19/2021: ALT 18; TSH 2.61 02/01/2021:  BUN 19; Creatinine, Ser 0.96; Potassium 4.2; Sodium 135 03/03/2021: Hemoglobin 14.3; Platelets 337  Recent Lipid Panel No results found for: CHOL, TRIG, HDL, CHOLHDL, VLDL, LDLCALC, LDLDIRECT  Physical Exam:    Vital Signs: BP (!) 162/96 (BP Location: Left Arm, Patient Position: Sitting, Cuff Size: Normal)   Pulse (!) 48   Ht 6\' 1"  (1.854 m)   Wt 217 lb 12.8 oz (98.8 kg)   SpO2 100%   BMI 28.74 kg/m     Wt Readings from Last 3 Encounters:  05/03/21 217 lb 12.8 oz (98.8 kg)  03/03/21 215 lb (97.5 kg)  02/12/21 220 lb (99.8 kg)     General: 55 y.o. male in no acute distress. HEENT: Normocephalic and atraumatic. Sclera clear.  Neck: Supple. No carotid bruits. No JVD. Heart: Slightly irregular with normal rate. Distinct S1 and S2. No murmurs, gallops, or rubs. Radial and distal pedal pulses 2+ and equal bilaterally. Lungs: No increased work of breathing. Clear to ausculation bilaterally. No wheezes, rhonchi, or rales.  Abdomen: Soft, non-distended, and non-tender to palpation.  Extremities: No lower extremity edema.    Skin: Warm and dry. Neuro: Alert and oriented x3. No focal deficits. Psych: Normal affect. Responds appropriately.  Assessment:    1. Abnormal stress test   2.  Presumed ischemic cardiomyopathy   3. Presumed CAD   4. PVC (premature ventricular contraction)   5. PAD (peripheral artery disease) ([place])   6. Primary hypertension   7. Hyperlipidemia, unspecified hyperlipidemia type   8. Type 2 diabetes mellitus with complication, without long-term current use of insulin (HCC)     Plan:    Abnormal Myoview Presumed CAD - Nuclear stress test in 07/2020 was intermediate risk with partially reversible inferior/inferoseptal perfusion defect suggestive of infarct with peri-infarct ischemia. Echo showed mildly reduced EF of 45% with hypokinesis of inferoseptal and inferior walls. Patient has multiple CV risk factors and cardiomyopathy felt to be ischemic in nature. However, patient preferred to hold off on cardiac catheterization and continue with medical management given he was relatively asymptomatic.  - Continues to do well. No significant angina.  - On DAPT with Aspirin and Plavix for PAD. - Continue Lipitor 80mg  daily.  Presumed Ischemic Cardiomyopathy - Echo as above - Appears euvolemic on exam. - Continue Losartan 100mg  daily. - Continue Toprol-XL 50mg  daily.  - Discussed adding SGLT2 inhibitor but patient would like to hold off on this for now. Willing to try it in the future if needed. - Discussed importance of daily weight and sodium/fluid restrictions.   PVC - Initial heart rate in the office read as 48 bpm on automatic BP machine. EKG ordered and showed normal sinus rhythm, rate 72 bpm, with multiple PVCs. Suspect machine was not picking up PVCs. - Patient asymptomatic with this. - Continue Toprol-XL as above. - Offered to check labs (BMET and Magnesium) but patient declined. - Advised patient to let us know if he becomes asymptomatic.  - Patient to keep a BP/HR log for 2 weeks and then send [name] to this. May increase Toprol-XL depending on readings.   PAD - s/p stenting to bilateral SFAs in 08/2020. Most recent lower extremity dopplers/ABI  in 01/2021 showed patent stents with no evidence of stenosis.  - On DAPT with Aspiring and Plavix. - Continue high-intensity statin. - Followed by Dr. Oneida Alar.  Hypertension - BP elevated in office today at 162/96. However, he states BP is usually much better controlled at home. - Continue Losartan 100mg  daily and Toprol-XL  50mg  daily.  - Patient to keep BP/HR log for 2 weeks and then update [name] via MyChart.  Hyperlipidemia - Lipid panel in 07/2020 (per [facility]): Total Cholesterol 146, Triglycerides 310, HDL 29, LDL 68. - LDL at goal of <70 given PAD and presumed CAD. - Continue Lipitor 80mg  daily. - Labs followed by PCP. If triglycerides still elevated at next check, may need to consider adding additional medications such as Vascepa.  Type 2 Diabetes Mellitus - Hemoglobin A1c 6.1 in 01/2021. - On Metformin.  - Discussed adding SGLT2 inhibitor given history cardiomyopathy. Patient states this was previously considered by another provider (PCP or Nephrology) but decision was made to hold off given hemoglobin A1c improved. He also stated that it was going to be very expensive for him. Patient would like to hold off on adding this at this time but is willing to reconsider it in the future if needed. - Followed by PCP.  Disposition: Follow up in 6 months with Dr. Harrell Gave.   Medication Adjustments/Labs and Tests Ordered: Current medicines are reviewed at length with the patient today.  Concerns regarding medicines are outlined above.  Orders Placed This Encounter  Procedures   EKG 12-Lead    No orders of the defined types were placed in this encounter.   Patient Instructions  Medication Instructions:  No changes *If you need a refill on your cardiac medications before your next appointment, please call your pharmacy*   Lab Work: No labs If you have labs (blood work) drawn today and your tests are completely normal, you will receive your results only by: [place] (if you have  MyChart) OR A paper copy in the mail If you have any lab test that is abnormal or we need to change your treatment, we will call you to review the results.   Testing/Procedures: No Testing   Follow-Up: At [facility], you and your health needs are our priority.  As part of our continuing mission to provide you with exceptional heart care, we have created designated Provider Care Teams.  These Care Teams include your primary Cardiologist (physician) and Advanced Practice Providers (APPs -  Physician Assistants and Nurse Practitioners) who all work together to provide you with the care you need, when you need it.   Your next appointment:   6 month(s)  The format for your next appointment:   In Person  Provider:   Buford Dresser, MD   Other Instructions Keep Log of Blood Pressure and Heart Rate for 2 week. Can Upload to Mychart.    Signed, Darreld Mclean, PA-C  05/03/2021 9:32 AM    [place]

## 2021-05-03 ENCOUNTER — Other Ambulatory Visit: Payer: Self-pay

## 2021-05-03 ENCOUNTER — Ambulatory Visit (INDEPENDENT_AMBULATORY_CARE_PROVIDER_SITE_OTHER): Payer: 59 | Admitting: Student

## 2021-05-03 ENCOUNTER — Encounter: Payer: Self-pay | Admitting: Student

## 2021-05-03 VITALS — BP 162/96 | HR 48 | Ht 73.0 in | Wt 217.8 lb

## 2021-05-03 DIAGNOSIS — R9439 Abnormal result of other cardiovascular function study: Secondary | ICD-10-CM

## 2021-05-03 DIAGNOSIS — I493 Ventricular premature depolarization: Secondary | ICD-10-CM | POA: Diagnosis not present

## 2021-05-03 DIAGNOSIS — I2510 Atherosclerotic heart disease of native coronary artery without angina pectoris: Secondary | ICD-10-CM

## 2021-05-03 DIAGNOSIS — E118 Type 2 diabetes mellitus with unspecified complications: Secondary | ICD-10-CM

## 2021-05-03 DIAGNOSIS — E785 Hyperlipidemia, unspecified: Secondary | ICD-10-CM

## 2021-05-03 DIAGNOSIS — I255 Ischemic cardiomyopathy: Secondary | ICD-10-CM | POA: Diagnosis not present

## 2021-05-03 DIAGNOSIS — I739 Peripheral vascular disease, unspecified: Secondary | ICD-10-CM | POA: Insufficient documentation

## 2021-05-03 DIAGNOSIS — I10 Essential (primary) hypertension: Secondary | ICD-10-CM

## 2021-05-03 NOTE — Addendum Note (Signed)
Addended by: Angelena Form on: 05/03/2021 11:20 AM   Modules accepted: Orders

## 2021-05-03 NOTE — Patient Instructions (Signed)
Medication Instructions:  No changes *If you need a refill on your cardiac medications before your next appointment, please call your pharmacy*   Lab Work: No labs If you have labs (blood work) drawn today and your tests are completely normal, you will receive your results only by: [place] (if you have MyChart) OR A paper copy in the mail If you have any lab test that is abnormal or we need to change your treatment, we will call you to review the results.   Testing/Procedures: No Testing   Follow-Up: At [facility], you and your health needs are our priority.  As part of our continuing mission to provide you with exceptional heart care, we have created designated Provider Care Teams.  These Care Teams include your primary Cardiologist (physician) and Advanced Practice Providers (APPs -  Physician Assistants and Nurse Practitioners) who all work together to provide you with the care you need, when you need it.   Your next appointment:   6 month(s)  The format for your next appointment:   In Person  Provider:   Buford Dresser, MD   Other Instructions Keep Log of Blood Pressure and Heart Rate for 2 week. Can Upload to Mychart.

## 2021-05-12 ENCOUNTER — Telehealth: Payer: Self-pay | Admitting: Cardiology

## 2021-05-12 NOTE — Telephone Encounter (Signed)
Thanks Almyra Free. I agree with your recommendations. I would like a few more BP/HR readings. I Haakon't think a heart rate of 57 should be causing much dizziness especially with that BP. He had multiple PVCs on EKG at my visit with him so his home machine may not be picking up the PVC beats and reading falsely low. I would like to keep him on Metoprolol for now because of these frequent PVCs but I agree that he can try taking it at night before bed to see if that helps his dizziness. We can make further recommendations after reviewing his BP/HR log. If dizziness continues and BP/HR log look OK, may need to consider ordering a monitor.  Thanks so much!

## 2021-05-12 NOTE — Telephone Encounter (Signed)
Called patient,  he states he just had a visit on 07/11 with Sande Rives, PA-C- he states he did mention to her that he was having some dizzy spells and she recommended to check his BP/HR, he states he has not been able to do this for the last 2 weeks but did to it today and it was 135/95 HR 57. Patient states his HR is normally not this low and he thinks this could be the cause of the dizziness. He states it just comes and goes, and it is hard to explain. I advised with him I would send over a message to get any recommendations, but before we make adjustments we need a few more readings for the BP/HR, he was advised to monitor this a different times once daily and let us know via mychart of these results. He also was advised on how to take orthostatic BP's and will do this at home and send [name] those as well. Patient advised to move slowly and not get up quickly. Stay hydrated, and we would call back with recommendations. He suggested trying to move when he takes the Metoprolol and see if this makes a difference, I advised he could try but we normally recommend to take at night but could depend on the person. I would let MD/PA advise and call back with recommendations.  He will mychart his BP/HR once he gets more.   Thanks!

## 2021-05-12 NOTE — Telephone Encounter (Signed)
Noted. Thanks!  Will monitor for updated BP/HR updates.

## 2021-05-12 NOTE — Telephone Encounter (Signed)
Pt c/o medication issue:  1. Name of Medication: Metoprolol  2. How are you currently taking this medication (dosage and times per day)? 1 time at night  3. Are you having a reaction (difficulty breathing--STAT)? no  4. What is your medication issue? Dizzy and pulse rate is real low-  today it was 21- he thinks medicine is making his pulse tt be low

## 2021-06-16 ENCOUNTER — Ambulatory Visit (INDEPENDENT_AMBULATORY_CARE_PROVIDER_SITE_OTHER): Payer: 59 | Admitting: Neurology

## 2021-06-16 ENCOUNTER — Other Ambulatory Visit: Payer: Self-pay | Admitting: Home Modifications

## 2021-06-16 ENCOUNTER — Encounter: Payer: Self-pay | Admitting: Neurology

## 2021-06-16 ENCOUNTER — Ambulatory Visit
Admission: RE | Admit: 2021-06-16 | Discharge: 2021-06-16 | Disposition: A | Discharge status: Home / Self Care | Payer: 59 | Source: Ambulatory Visit | Attending: Home Modifications | Admitting: Home Modifications

## 2021-06-16 ENCOUNTER — Other Ambulatory Visit: Payer: Self-pay

## 2021-06-16 VITALS — BP 121/79 | HR 96 | Ht 73.0 in | Wt 219.2 lb

## 2021-06-16 DIAGNOSIS — M545 Low back pain, unspecified: Secondary | ICD-10-CM

## 2021-06-16 DIAGNOSIS — G44219 Episodic tension-type headache, not intractable: Secondary | ICD-10-CM | POA: Diagnosis not present

## 2021-06-16 DIAGNOSIS — G1220 Motor neuron disease, unspecified: Secondary | ICD-10-CM | POA: Diagnosis not present

## 2021-06-16 DIAGNOSIS — R2689 Other abnormalities of gait and mobility: Secondary | ICD-10-CM | POA: Diagnosis not present

## 2021-06-16 DIAGNOSIS — R29898 Other symptoms and signs involving the musculoskeletal system: Secondary | ICD-10-CM

## 2021-06-16 MED ORDER — SERTRALINE HCL 25 MG PO TABS: 25.0000 mg | ORAL_TABLET | Freq: Every day | ORAL | 5 refills | Status: DC

## 2021-06-16 NOTE — Progress Notes (Addendum)
NEUROLOGY FOLLOW UP OFFICE NOTE  Dustin Sampson HC:[id number]  Assessment/Plan:   1.  Balance disorder - intermittent - some improvement following stents but not resolved.  Also, given the nystagmus, while PAD contributed to balance problems, not the only etiology.  He does not exhibit smptoms of progressive supranuclear palsy or Parkinson disease.  Imaging of brian reveals no evidence of NPH.  He reports similar symptoms with his father - consider genetic etiology - no cerebellar atrophy to suggest cerebellar ataxia.  Although late age of onset, consider episodic ataxia but did not respond to acetazolamide.  MRI brain does demonstrate small chronic cerebellar infarct but I Casy't think this would explain the symptoms.  Although he does not exhibit any significant chronic cerebellar/brainstem vascular disease, would still evaluate for vertebrobasilar insufficiency.  He does endorse left leg weakness which should be further evaluated. 2.  Intermittent diplopia - unclear if related to underlying central vestibulopathy or related to diabetes - Myasthenia gravis not suspected.  Very well may be unrelated to the balance problems.  To evaluate for cause of left leg weakness, check MRI of cervical/thoracic/lumbar spine Further recommendations pending results.  If unrevealing, would like refer to an academic center.  ADDENDUM: MRI of cervical/thoracic/lumbar spine on 07/04/2021 personally reviewed.  There is mild spinal stenosis at C3-C4, small disc protrusions causing mild cord flattening at T4-5 and T6-7 and mild canal stenosis at T11-12 and left sided disc protrusion/foraminal stenosis at L3-4 possibly touching the left L3 or L4 nerve roots but normal spinal cord and nothing significant that I would expect to be the cause of his symptoms.  I will refer him to [facility]. Metta Clines, DO  Subjective:  Dustin Sampson is a 55 year old male with hypertension, hyperlipidemia, monoclonal paraproteinemia, parotid  gland abnormalities, and hypothyroidism who follows up for balance problems.   UPDATE: Labs from March 2022 include B1 9, B12 383, TSH 2.61, glucose 115.  CTA of head on 02/03/2021 personally reviewed demonstrated no vertebrobasilar insufficiency.  He was started on a trial of acetazolamide to see if balance improved but he felt it made symptoms worse.  In June, he was on vacation in [place].  He reports dragging  his left leg more.  He feels fine when he first gets up in the morning but symptoms start about 30 minutes later.   Current NSAIDS:  Contraindicated (kidney problems) Current analgesics:  2 tramadol Current triptans:  none Current ergotamine:  none Current anti-emetic:  none Current muscle relaxants:  none Current anti-anxiolytic:  none Current sleep aide:  Restoril Current Antihypertensive medications:  Losartan '50mg'$  Current Antidepressant medications:  none Current Anticonvulsant medications:  Topiramate '100mg'$  at bedtime (has not been taking; gabapentin '300mg'$  three times daily (for neuropathy) Current anti-CGRP:  none Current Vitamins/Herbal/Supplements:  none Current Antihistamines/Decongestants:  Sudafed Other therapy:  none   Caffeine:  1 to 3 cups of coffee daily. Exercise:  no Depression:  no; Anxiety:  Yes. Other pain:  gout Sleep hygiene:  varies   HISTORY:  He has had balance problems since around 2018 but has gotten worse.  It is not associated with dizziness.  He feels wobbly when he is walking.  It is intermittent.  He feels that his legs are weak.  If he walks 200 years, he has pain, described as tightness in back of his ankles and calves.  If he rests, he feels okay. He has some low back pain and notes some numbness in the feet.  He  takes gabapentin which isn't too helpful.  He may spend a couple of hours standing working in the yard and when he later is standing in the shower, it feels like his legs will buckle.  He feels that he drags his left leg.  Sometimes he  has trouble with his right leg.  He also reports that he sometimes has double vision, such as while driving or watching TV and he needs to shut one eye.  He went to the eye doctor and thought that he may have had astigmatism.  He reports droopy eyelids and is concerned about myasthenia gravis (doctors thought his father may have had MG, but that was ruled out). However, Myasthenia gravis panel (striated muscle Ab, AchR binding Ab) from April 2021 was negative.  He does have hypothyroidism which he reports is currently not well-treated on Synthroid and is supposed to see an endocrinologist.  He has longstanding history of diabetes.  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  Vascular studies were positive for peripheral arterial disease.  He had stents put in which has helped with the leg pain but not the balance.  I had referred him to ophthalmology but he never made an appointment.   He has a history of poor sleep and takes Restoril.  He does not feel fatigued during the day.  His wife says he does not snore much.  He is a cigarette smoker.  He does not drink alcohol.  He does not exercise routinely.  He says he hydrates with water during the day.     He has had chronic headaches all of his life.  He reports history of migraines, tension type headache and "sinus" headaches.  He reports a constant moderate pressure headache (sometimes throbbing) located above either eye (right more than left) and back of the head.  He has associated neck tightness.  It is constant.  On only rare occasions is there nausea.  Otherwise, no nausea, vomiting, photophobia, phonophobia or visual disturbance.  Alcohol is a trigger. Nothing really relieves it except for pain relievers.   CT  head without contrast from 07/06/2011 personally reviewed and showed remote lacunar infarcts in cerebellum but otherwise no acute intracranial abnormality.  I saw him in October 2018.  At that time, he reported taking Fioricet twice daily (for 20 years), Advil daily (to treat headache as well as joint pain), drank caffeine every morning and at takes would take Excedrin as well.  He also would use an intranasal decongestant.  At that time, I had started him on nortriptyline and given him instructions on how to taper off of Fioricet.  He never followed up.   Past NSAIDS:  Advil Past analgesics:  Excedrin; Fioricet Past abortive triptans:  none Past abortive ergotamine:  none Past muscle relaxants:  none Past anti-emetic:  none Past antihypertensive medications:  none Past antidepressant medications:  Nortriptyline '25mg'$  (did not do well with it) Past anticonvulsant medications:  none Past anti-CGRP:  none Past vitamins/Herbal/Supplements:  none Other past therapies:  none  PAST MEDICAL HISTORY: Past Medical History:  Diagnosis Date   Anxiety    Arthritis    Diabetes mellitus without complication ([place])    Gout    Hyperlipemia    Hypertension    Hypothyroidism  Insomnia    Pneumonia    Stroke [facility])    Thyroid disease     MEDICATIONS: Current Outpatient Medications on File Prior to Visit  Medication Sig Dispense Refill   acetaminophen (TYLENOL) 500 MG tablet Take 500-1,000 mg by mouth every 6 (six) hours as needed (for pain.).     aspirin EC 81 MG tablet Take 1 tablet (81 mg total) by mouth daily. (Patient taking differently: Take 81 mg by mouth every evening.) 150 tablet 2   atorvastatin (LIPITOR) 80 MG tablet Take 80 mg by mouth every evening.     clopidogrel (PLAVIX) 75 MG tablet Take 1 tablet (75 mg total) by mouth daily. 30 tablet 11   Febuxostat 80 MG TABS Take 80 mg by mouth every evening.     gabapentin (NEURONTIN) 300 MG capsule Take 300 mg by mouth 3 (three) times daily.      levothyroxine (SYNTHROID, LEVOTHROID) 200 MCG tablet Take 200 mcg by mouth every evening.      losartan (COZAAR) 100 MG tablet Take 100 mg by mouth every evening.      metFORMIN (GLUCOPHAGE-XR) 500 MG 24 hr tablet Take 500 mg by mouth 2 (two) times daily.     metoprolol succinate (TOPROL-XL) 50 MG 24 hr tablet Take 1 tablet (50 mg total) by mouth daily. Take with or immediately following a meal. 90 tablet 3   tadalafil (CIALIS) 20 MG tablet Take 20 mg by mouth daily as needed for erectile dysfunction.      temazepam (RESTORIL) 30 MG capsule Take 30 mg by mouth at bedtime.     tiZANidine (ZANAFLEX) 4 MG tablet Take 1 tablet (4 mg total) by mouth every 8 (eight) hours as needed for muscle spasms. (Patient not taking: Reported on 06/16/2021) 30 tablet 1   No current facility-administered medications on file prior to visit.    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 General: No acute distress.  Patient appears well-groomed.   Head:  Normocephalic/atraumatic Eyes:  Fundi examined but not visualized Neck: supple, no paraspinal tenderness, full range of motion Heart:  Regular rate and rhythm Lungs:  Clear to auscultation bilaterally Back: No paraspinal tenderness Neurological Exam: alert and oriented to person, place, and time.  Speech fluent and not dysarthric, language intact.   Saccadic eye movements on tracking.  Some numbness over his left parotid gland.  Otherwise, CN II-XII intact. Bulk and tone normal, muscle strength 5-/5 left lower extremity.  Decreased pinprick sensation involving toes of left foot.  Vibratory sensation intact..  Deep tendon reflexes 2+ throughout, toes downgoing.  Finger to nose testing intact.  Broad-based gait.  Unable to tandem walk.  Romberg with sway.  Metta Clines, DO  CC: [name], PA-C

## 2021-06-16 NOTE — Patient Instructions (Signed)
Check MRI of cervical/thoracic/lumbar spine.  Further recommendations pending results For headaches, start sertraline '25mg'$  daily.  Keep headache diary.  If no improvement in 6 weeks, contact me and we can increase dose Limit use of pain relievers to no more than 2 days out of week to prevent risk of rebound or medication-overuse headache.

## 2021-07-04 ENCOUNTER — Ambulatory Visit
Admission: RE | Admit: 2021-07-04 | Discharge: 2021-07-04 | Disposition: A | Discharge status: Home / Self Care | Payer: 59 | Source: Ambulatory Visit | Attending: Neurology | Admitting: Neurology

## 2021-07-04 ENCOUNTER — Other Ambulatory Visit: Payer: Self-pay

## 2021-07-04 DIAGNOSIS — R2689 Other abnormalities of gait and mobility: Secondary | ICD-10-CM

## 2021-07-04 DIAGNOSIS — G1220 Motor neuron disease, unspecified: Secondary | ICD-10-CM

## 2021-07-04 DIAGNOSIS — R29898 Other symptoms and signs involving the musculoskeletal system: Secondary | ICD-10-CM

## 2021-07-06 ENCOUNTER — Telehealth: Payer: Self-pay

## 2021-07-06 DIAGNOSIS — R2689 Other abnormalities of gait and mobility: Secondary | ICD-10-CM

## 2021-07-06 DIAGNOSIS — R29898 Other symptoms and signs involving the musculoskeletal system: Secondary | ICD-10-CM

## 2021-07-06 NOTE — Telephone Encounter (Signed)
Referral for [facility] added.  Pt advised of his MRI results.

## 2021-07-06 NOTE — Telephone Encounter (Signed)
-----   Message from Pieter Partridge, DO sent at 07/06/2021  6:58 AM EDT ----- MRI of spinal cord and lower back reviewed.  It shows some mild disc bulges with a disc touching the spinal cord in the mid back and disc touching a couple of nerves in low back but I [name]'t think it shows anything significant that would be causing his balance problems and left leg weakness.  I would like to refer him to [facility] neurology for evaluation of balance disorder and lower extremity weakness.

## 2021-07-26 ENCOUNTER — Telehealth: Payer: Self-pay | Admitting: Neurology

## 2021-07-26 NOTE — Telephone Encounter (Signed)
Pt said he still has not heard from [facility] regarding a referral. He was wanting to follow up with sheena about this.

## 2021-07-27 NOTE — Telephone Encounter (Signed)
Pt returned our call, Pt given the number to [facility].

## 2021-07-27 NOTE — Telephone Encounter (Signed)
[facility] [address] [place], [place] [address] Office: [phone]  Tried calling pt, No answer. VM not set up. Unable to LVM.

## 2021-08-25 IMAGING — CT CT ANGIO HEAD
3 of 10 series · 17 of 47 positions shown · IV contrast (iopamidol)
Comparison: CT head January 05, 2021. CT neck June 05, 2019.

CLINICAL DATA: Diplopia.

EXAM:
CT ANGIOGRAPHY HEAD
TECHNIQUE: Multidetector CT imaging of the head was performed using the
standard protocol during bolus administration of intravenous
contrast. Multiplanar CT image reconstructions and MIPs were
obtained to evaluate the vascular anatomy.
CONTRAST:  75mL [id number] IOPAMIDOL ([id number]-QFX) INJECTION 76%

[Series 16: cta brain 1.00 hv48 ax thin mips · axial · 0.45mm/px · z∈[-635,-487]mm · 11 of 178 slices shown]
[im 15/178  brain]
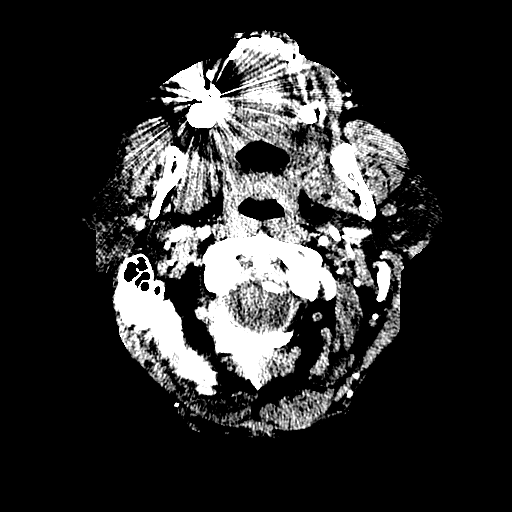
[im 30/178  bone]
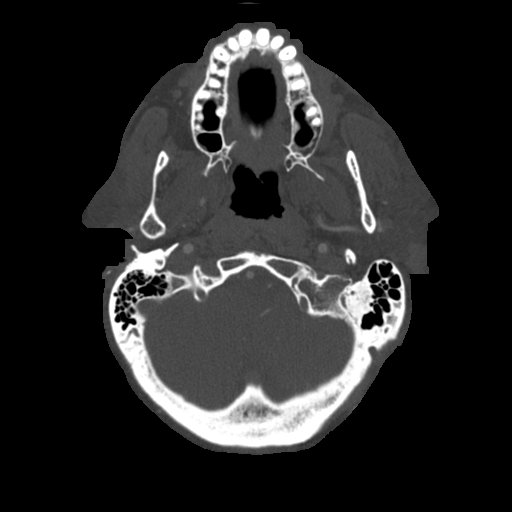
[im 45/178  brain]
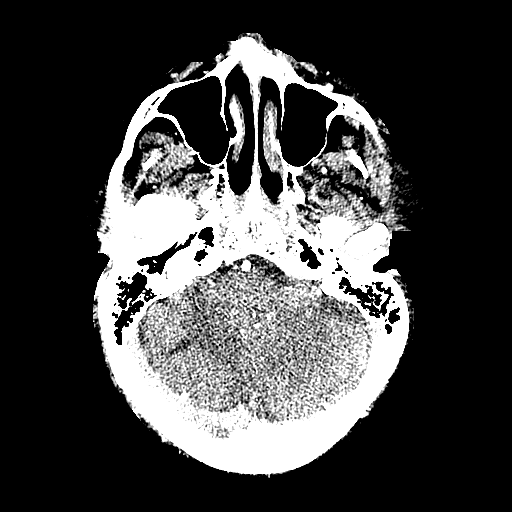
[im 60/178  bone]
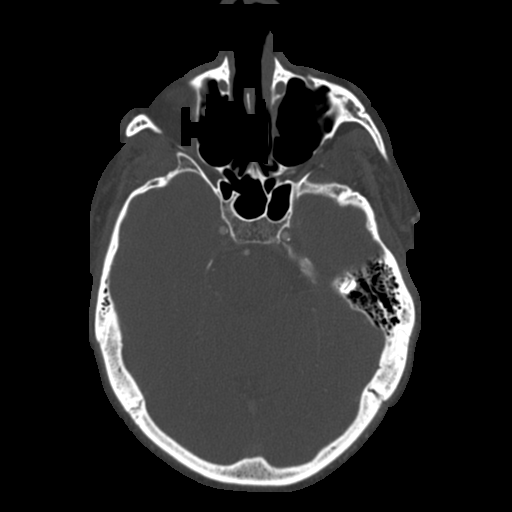
[im 74/178  brain]
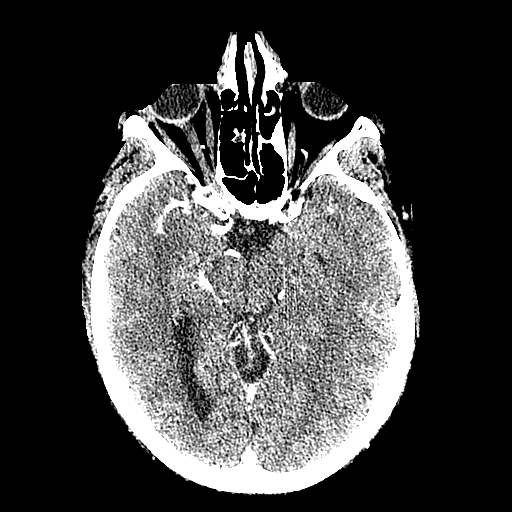
[im 89/178  bone]
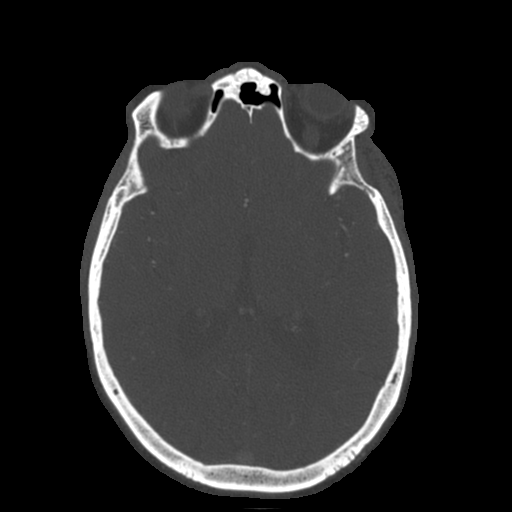
[im 104/178  brain]
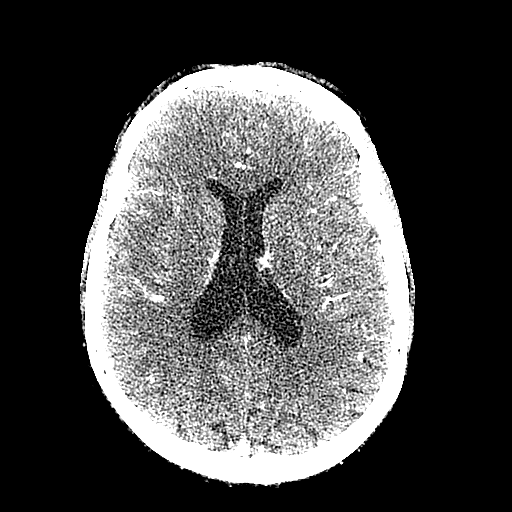
[im 119/178  bone]
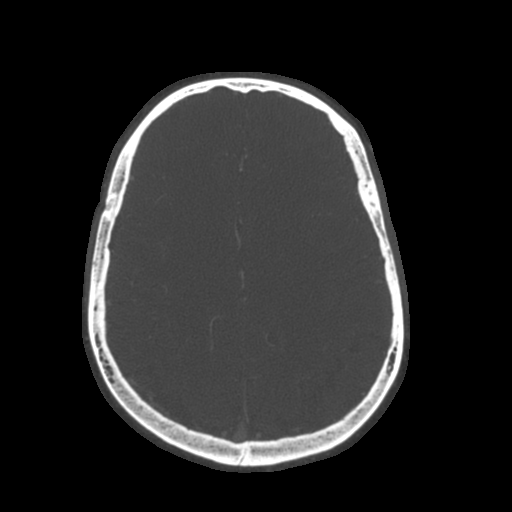
[im 133/178  brain]
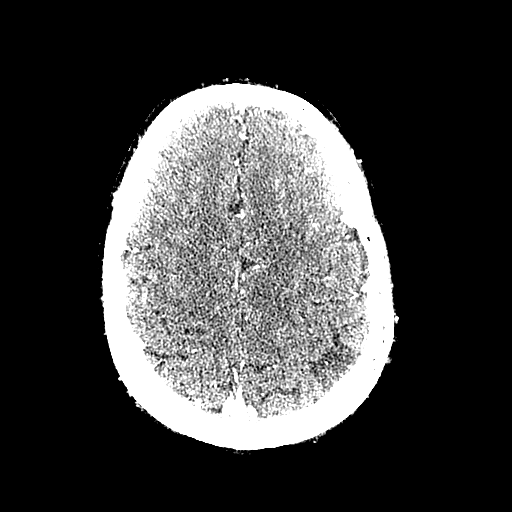
[im 148/178  bone]
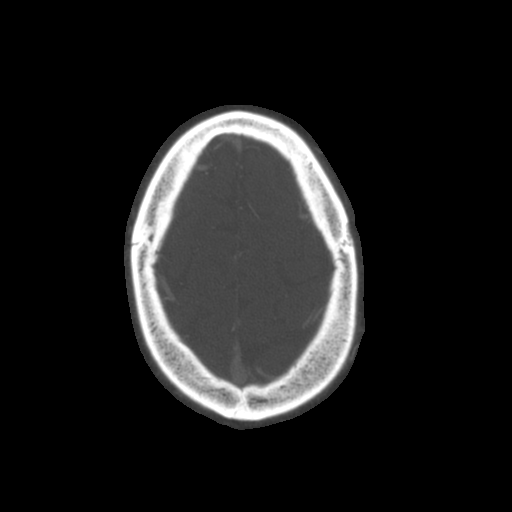
[im 163/178  brain]
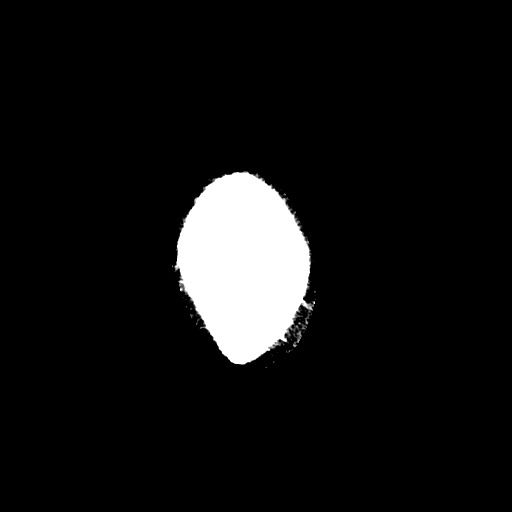

[Series 17: cta brain 1.00 hv48 cor thin mips · coronal · 0.35mm/px · 3 of 208 slices shown]
[im 70/208  brain]
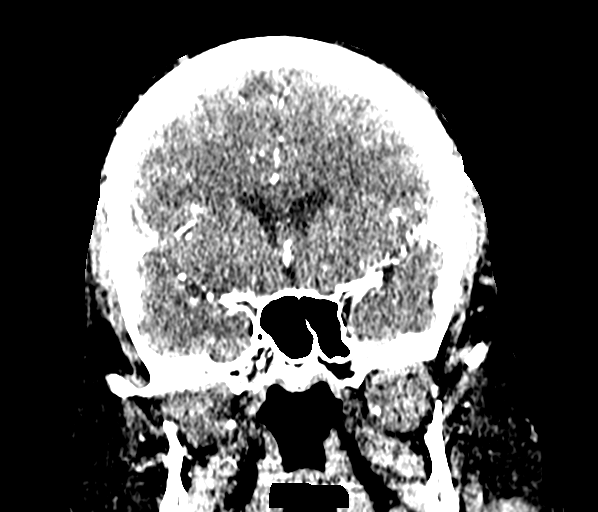
[im 104/208  brain]
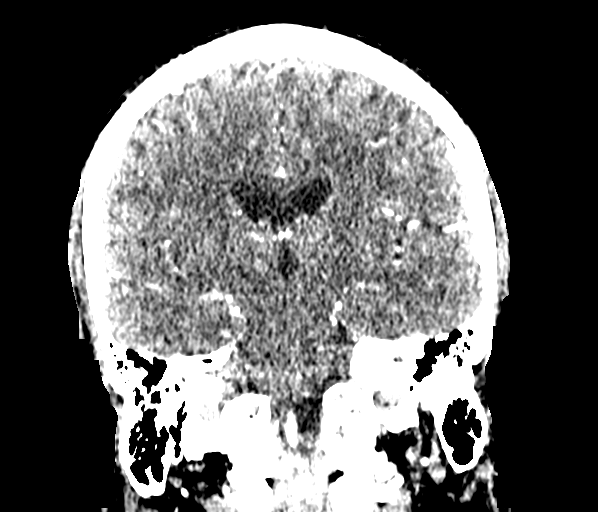
[im 139/208  brain]
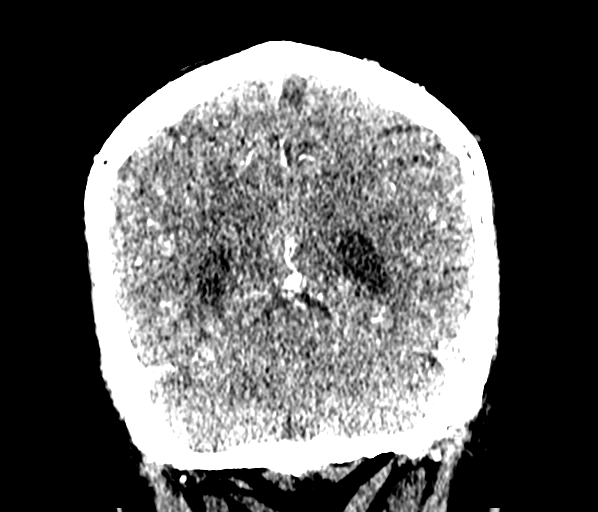

[Series 19: cta brain 1.00 hv48 sag thin mips · sagittal · 0.35mm/px · 3 of 208 slices shown]
[im 52/208  brain]
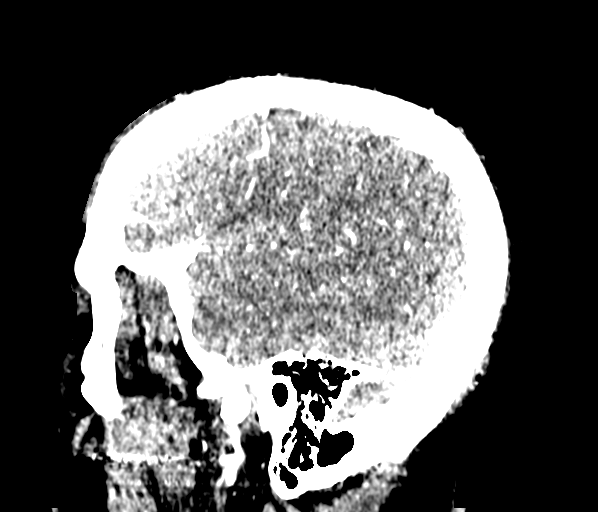
[im 104/208  brain]
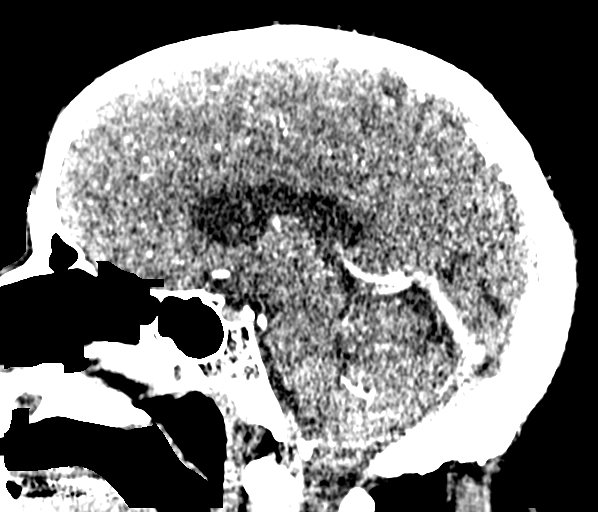
[im 156/208  brain]
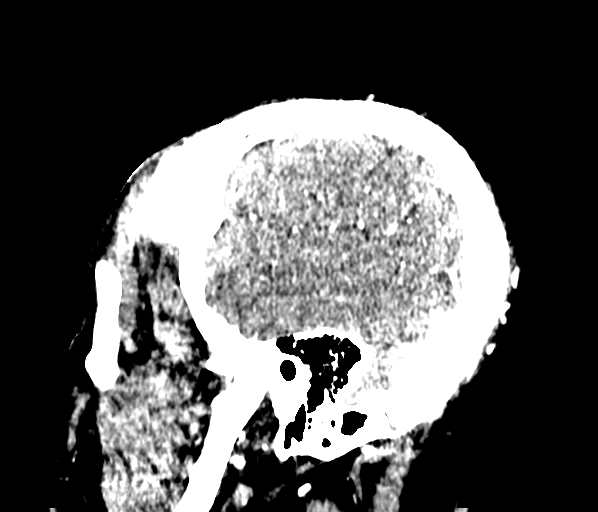

[17 of 47 positions shown; findings below may reference images not displayed]

FINDINGS: CT HEAD

Brain: No evidence of acute large vascular territory infarction,
hemorrhage, hydrocephalus, extra-axial collection or mass
lesion/mass effect. Tiny left caudate a remote lacunar infarct and
remote right cerebellar lacunar infarcts, similar to priors.

Skull: No acute fracture.

Sinuses: Small right maxillary sinus retention cyst versus polyp.
Unremarkable orbits.

Other: No mastoid effusions.Partially imaged left parotid gland
masses.

CTA HEAD

Anterior circulation: Bilateral intracranial internal carotid
arteries, middle cerebral arteries and anterior cerebral arteries
are patent without proximal hemodynamically significant stenosis or
aneurysm. Calcific atherosclerosis of bilateral cavernous and
paraclinoid ICAs without greater than 50% stenosis

Posterior circulation: Small right vertebral artery makes a minimal
contribution to the basilar artery, likely related to congenitally
non-dominant artery given similarly diminutive right vertebral
artery on prior CT neck from 06/05/2019. Left intradural vertebral
artery, basilar artery and bilateral posterior cerebral arteries are
patent without proximal hemodynamically significant stenosis. No
aneurysm.

Venous sinuses: As permitted by contrast timing, patent.

Review of the MIP images confirms the above findings.
IMPRESSION: 1. No evidence of acute intracranial abnormality.
2. No large vessel occlusion, hemodynamically significant proximal
stenosis or aneurysm.
3. Small remote right cerebellar and left caudate infarcts.
4. Partially imaged left parotid gland masses. Correlate with prior
biopsy.

## 2021-08-27 ENCOUNTER — Other Ambulatory Visit: Payer: Self-pay | Admitting: Cardiology

## 2021-08-27 DIAGNOSIS — I255 Ischemic cardiomyopathy: Secondary | ICD-10-CM

## 2021-08-31 ENCOUNTER — Other Ambulatory Visit: Payer: Self-pay | Admitting: Physician Assistant

## 2021-08-31 MED ORDER — CLOPIDOGREL BISULFATE 75 MG PO TABS: 75.0000 mg | ORAL_TABLET | Freq: Every day | ORAL | 11 refills | Status: DC

## 2021-09-10 ENCOUNTER — Ambulatory Visit (HOSPITAL_BASED_OUTPATIENT_CLINIC_OR_DEPARTMENT_OTHER): Payer: 59 | Admitting: Cardiology

## 2021-09-10 NOTE — Progress Notes (Incomplete)
Cardiology Office Note:    Date:  09/10/2021   ID:  Dustin Sampson, DOB August 08, 1966, MRN [id number]  PCP:  [name], PA-C  Cardiologist:  Buford Dresser, MD  Referring MD: [name], PA-C   CC: Follow-up  History of Present Illness:    Dustin Sampson is a 55 y.o. male with a hx of hypertension, hyperlipidemia, type II diabetes, hypothyroidism, claudication, PAD, currently tobacco use, CVA by MRI,  MGUS who is seen for follow up today. I initially met him 08/11/20 as a new consult at the request of [name], PA-C for the evaluation and management of risk for CAD.  Cardiovascular risk factors: Prior clinical ASCVD: being evaluated for PAD. Concern for prior silent CVA based on MRI Comorbid conditions: Endorses hypertension, hyperlipidemia, diabetes, chronic kidney disease Metabolic syndrome/Obesity: Highest adult weight was 238 lbs, working on weight loss now Tobacco use history: still smoking, working on weaning off. Peak 1 ppd Family history: father had 4-5 stents, no heart attack that he knows of. Mother has afib.   Today: Reviewed workup to date with stress test, echo.   Lifted weights, no limitations. Hasn't climbed stairs. Walked around the block twice, mild shortness of breath but no chest pain or leg pain. Hasn't been very active recently. Able to catch his breath after several seconds and keep walking. Hasn't felt limited in terms of not being able to do what he wanted to do. Has rare lightheadedness but no syncope.  Hasn't had significant chest pain since starting metoprolol succinate.  We discussed medical management vs cath today. Discussed guideline recommendations. Will continue with medical management. Reviewed change in symptoms/reasons to move forward with cath.  I did receive his most recent note from [facility] dated 09/21/20. BP at that visit 118/74.  Plans to try to increase his exercise level. Discussed symptoms to watch for. Still  smoking but working on quitting.  Discussed nitro, defers for now. Does use cialis on occasion.   Today:   He denies any palpitations, chest pain, or shortness of breath. No lightheadedness, headaches, syncope, orthopnea, PND, lower extremity edema or exertional symptoms.  (+)  Past Medical History:  Diagnosis Date   Anxiety    Arthritis    Diabetes mellitus without complication ([place])    Gout    Hyperlipemia    Hypertension    Hypothyroidism    Insomnia    Pneumonia    Stroke [facility])    Thyroid disease     Past Surgical History:  Procedure Laterality Date   ABDOMINAL AORTOGRAM W/LOWER EXTREMITY Bilateral 08/21/2020   Procedure: ABDOMINAL AORTOGRAM W/LOWER EXTREMITY;  Surgeon: Elam Dutch, MD;  Location: [facility];  Service: Cardiovascular;  Laterality: Bilateral;   ABDOMINAL AORTOGRAM W/LOWER EXTREMITY N/A 09/04/2020   Procedure: ABDOMINAL AORTOGRAM W/LOWER EXTREMITY;  Surgeon: Elam Dutch, MD;  Location: [place] [facility];  Service: Cardiovascular;  Laterality: N/A;   COLONOSCOPY     x2   FEMORAL ARTERY EXPLORATION Left 09/04/2020   Procedure: FEMORAL ARTERY EXPLORATION;  Surgeon: Angelia Mould, MD;  Location: [facility] OR;  Service: Vascular;  Laterality: Left;   NASAL SINUS SURGERY     PERIPHERAL VASCULAR INTERVENTION Left 08/21/2020   Procedure: PERIPHERAL VASCULAR INTERVENTION;  Surgeon: Elam Dutch, MD;  Location: [place] [facility];  Service: Cardiovascular;  Laterality: Left;  sfa stent x 3   PERIPHERAL VASCULAR INTERVENTION  09/04/2020   Procedure: PERIPHERAL VASCULAR INTERVENTION;  Surgeon: Elam Dutch, MD;  Location:  [place] [facility];  Service: Cardiovascular;;  Right SFA   SHOULDER ARTHROSCOPY WITH ROTATOR CUFF REPAIR AND SUBACROMIAL DECOMPRESSION Left 02/04/2021   Procedure: SHOULDER ARTHROSCOPY WITH ROTATOR CUFF REPAIR AND SUBACROMIAL DECOMPRESSION;  Surgeon: Tania Ade, MD;  Location: [facility];  Service: Orthopedics;   Laterality: Left;   WISDOM TOOTH EXTRACTION     WRIST SURGERY     bilat    Current Medications: Current Outpatient Medications on File Prior to Visit  Medication Sig   acetaminophen (TYLENOL) 500 MG tablet Take 500-1,000 mg by mouth every 6 (six) hours as needed (for pain.).   aspirin EC 81 MG tablet Take 1 tablet (81 mg total) by mouth daily. (Patient taking differently: Take 81 mg by mouth every evening.)   atorvastatin (LIPITOR) 80 MG tablet Take 80 mg by mouth every evening.   clopidogrel (PLAVIX) 75 MG tablet Take 1 tablet (75 mg total) by mouth daily.   Febuxostat 80 MG TABS Take 80 mg by mouth every evening.   gabapentin (NEURONTIN) 300 MG capsule Take 300 mg by mouth 3 (three) times daily.   levothyroxine (SYNTHROID, LEVOTHROID) 200 MCG tablet Take 200 mcg by mouth every evening.    losartan (COZAAR) 100 MG tablet Take 100 mg by mouth every evening.    metFORMIN (GLUCOPHAGE-XR) 500 MG 24 hr tablet Take 500 mg by mouth 2 (two) times daily.   metoprolol succinate (TOPROL-XL) 50 MG 24 hr tablet TAKE ONE TABLET BY MOUTH DAILY TAKE WITH OR IMMEDIATELY FOLLOWING A MEAL   sertraline (ZOLOFT) 25 MG tablet Take 1 tablet (25 mg total) by mouth daily.   tadalafil (CIALIS) 20 MG tablet Take 20 mg by mouth daily as needed for erectile dysfunction.    temazepam (RESTORIL) 30 MG capsule Take 30 mg by mouth at bedtime.   tiZANidine (ZANAFLEX) 4 MG tablet Take 1 tablet (4 mg total) by mouth every 8 (eight) hours as needed for muscle spasms. (Patient not taking: Reported on 06/16/2021)   No current facility-administered medications on file prior to visit.     Allergies:   Aspirin, Avelox [moxifloxacin hcl in nacl], Nsaids, and Penicillins   Social History   Tobacco Use   Smoking status: Every Day    Packs/day: 0.25    Types: Cigarettes   Smokeless tobacco: Former   Tobacco comments:    1/2 PACK A DAY  Vaping Use   Vaping Use: Never used  Substance Use Topics   Alcohol use: Not Currently    Drug use: No    Family History: family history includes Atrial fibrillation in his mother; Autoimmune disease in his sister; Colon cancer in his father; Healthy in his daughter and son; Lung cancer in his father.  ROS:   Please see the history of present illness.    Additional pertinent ROS otherwise unremarkable.   EKGs/Labs/Other Studies Reviewed:    The following studies were reviewed today:  CT Chest 04/19/2021: COMPARISON:  June 17, 2019   FINDINGS: Cardiovascular: There is mild calcified atherosclerotic change in the aortic arch. No thoracic aortic aneurysm. The pulmonary arteries are normal in caliber. Calcified atherosclerosis is seen in the left coronary arteries. Heart size is normal.   Mediastinum/Nodes: No enlarged mediastinal or axillary lymph nodes. Thyroid gland, trachea, and esophagus demonstrate no significant findings.   Lungs/Pleura: Central airways are normal. No pneumothorax. Mild paraseptal emphysematous changes. The 5 mm nodule on series 3, image 41 of the previous study has resolved. There is a nodule in the right lung  on series 8, image 69 measuring 5 mm, unchanged. There is a subtle nodule in the left upper lobe anterior to the fissure on series 8, image 38, unchanged. No other suspicious solid nodules. Innumerable tiny centrilobular pulmonary nodules remain remain. No masses or infiltrates.   Upper Abdomen: No acute abnormality.   Musculoskeletal: No chest wall mass or suspicious bone lesions identified.   IMPRESSION: 1. The 5 mm nodule in the right upper lobe on the previous study has resolved. There is a 5 mm nodule on series 8, image 69 on the right and another smaller nodule in the left upper lobe on series 8, image 38 measuring less than 5 mm. Recommend a follow-up CT scan in 1 year to establish at least 2 years of stability. 2. Innumerable tiny centrilobular pulmonary nodules may be smoking related, representing respiratory  bronchiolitis, and are similar in the interval. Recommend clinical correlation. 3. Calcified atherosclerosis in the left coronary arteries as above.   Aortic Atherosclerosis (ICD10-I70.0).  LE Arterial Duplex 02/12/2021: Summary:  Right: Patent stent with no evidence of stenosis in the proximal  superficial femoral artery to distal superficial femoral artery.   Left: Patent stent with no evidence of stenosis in the proximal  superficial femoral artery to distal superficial femoral artery.   ABI 02/12/2021: Previous ABI pre op on 07/30/20.     Summary:  Right: Resting right ankle-brachial index is within normal range. The  right toe-brachial index is normal. RT great toe pressure = 135 mmHg.   Left: Resting left ankle-brachial index is within normal range. The left  toe-brachial index is normal. LT Great toe pressure = 138 mmHg.   Echo 09/21/20 1. Left ventricular ejection fraction, by estimation, is 45%. The left  ventricle has mildly decreased function. The left ventricle demonstrates  regional wall motion abnormalities with inferoseptal and inferior  hypokinesis. Left ventricular diastolic  parameters are consistent with Grade I diastolic dysfunction (impaired  relaxation).   2. Right ventricular systolic function is normal. The right ventricular  size is normal. Tricuspid regurgitation signal is inadequate for assessing  PA pressure.   3. The mitral valve is normal in structure. No evidence of mitral valve  regurgitation. No evidence of mitral stenosis.   4. The aortic valve is tricuspid. Aortic valve regurgitation is not  visualized. No aortic stenosis is present.   5. The inferior vena cava is normal in size with greater than 50%  respiratory variability, suggesting right atrial pressure of 3 mmHg.  Lexiscan myoview 08/18/20 There was no ST segment deviation noted during stress. The left ventricular ejection fraction is mildly decreased (45-54%). Nuclear stress EF:  45%. Defect 1: There is a medium defect of moderate severity present in the basal inferoseptal, basal inferior, mid inferoseptal, mid inferior and apical inferior location. Findings consistent with prior myocardial infarction with peri-infarct ischemia. This is an intermediate risk study.   1. Partially reversible inferior/inferoseptal perfusion defect suggestive of infarct with peri-infarct ischemia 2. Mild systolic dysfunction (EF 65%) 3. Intermediate risk study   ABI 07/30/20 Right: Resting right ankle-brachial index indicates moderate right lower  extremity arterial disease. The right toe-brachial index is abnormal.   Left: Resting left ankle-brachial index indicates moderate left lower  extremity arterial disease. The left toe-brachial index is abnormal.  Carotids 07/31/20 Right Carotid: Velocities in the right ICA are consistent with a 1-39%  stenosis.   Left Carotid: Velocities in the left ICA are consistent with a 40-59%  stenosis.   CT chest WO  contrast 06/17/19 personally reviewed, aortic calcium consistent with aortic atherosclerosis, coronaries not well seen/cannot exclude calcium   EKG:  EKG is personally reviewed.   09/10/2021: *** 08/11/20: NSR at 92 bpm  Recent Labs: 01/19/2021: ALT 18; TSH 2.61 02/01/2021: BUN 19; Creatinine, Ser 0.96; Potassium 4.2; Sodium 135 03/03/2021: Hemoglobin 14.3; Platelets 337   Recent Lipid Panel No results found for: CHOL, TRIG, HDL, CHOLHDL, VLDL, LDLCALC, LDLDIRECT  Physical Exam:    VS:  There were no vitals taken for this visit.    Wt Readings from Last 3 Encounters:  06/16/21 219 lb 3.2 oz (99.4 kg)  05/03/21 217 lb 12.8 oz (98.8 kg)  03/03/21 215 lb (97.5 kg)    GEN: Well nourished, well developed in no acute distress HEENT: Normal, moist mucous membranes NECK: No JVD CARDIAC: regular rhythm, normal S1 and S2, no rubs or gallops. 1/6 systolic murmur*** VASCULAR: Radial pulses 2+ bilaterally. No carotid  bruits RESPIRATORY:  ***Distant with long expiratory phase but no wheezing appreciated ABDOMEN: Soft, non-tender, non-distended MUSCULOSKELETAL:  Ambulates independently SKIN: Warm and dry, no edema NEUROLOGIC:  Alert and oriented x 3. No focal neuro deficits noted. PSYCHIATRIC:  Normal affect   ASSESSMENT:    No diagnosis found.  PLAN:    Aortic atherosclerosis Claudication, with moderate PAD in lower extremities by ABI Prior CVA by MRI Hypertension Type II diabetes with hyperlipidemia Ongoing tobacco use -BP elevated today, but recently well controlled at nephrology visit. Continue to monitor -continue aspirin 81 mg, clopidogrel 75 mg, atorvastatin 80 mg for ASCVD risk/PAD. Last LDL 68, but TG 310 (unknown if fasting) -continue losartan for diabetes/hypertension -consider SGLT2i or GLP1RA in the future -counseled on role of smoking and counseled to quit completely -discussed red flag warning signs that need immediate medical attention  Cardiomyopathy -we reviewed his ischemia workup to date. His nuclear stress was abnormal, and his EF is about 45% -continue metoprolol succinate and losartan -he has a history of prior kidney disease, though recent labs encouraging. Would need to discuss ARNI or MRA with his nephrologist -I am suspicious that this is ischemic in nature. We discussed defining anatomy by cath. We also reviewed the indications for stenting or CABG. He is feeling well on current medicines. After shared decision making, will proceed with medical management for now, but discussed what would move [name] to perform a cath. -working on increasing his activity now that his leg pain is improved  Tobacco abuse counseling: The patient was counseled on tobacco cessation today for 3 minutes.  Counseling included reviewing the risks of smoking tobacco products, how it impacts the patient's current medical diagnoses and different strategies for quitting.  Pharmacotherapy to aid in  tobacco cessation was not prescribed today.  Cardiac risk counseling and prevention recommendations: -recommend heart healthy/Mediterranean diet, with whole grains, fruits, vegetable, fish, lean meats, nuts, and olive oil. Limit salt. -recommend moderate walking, 3-5 times/week for 30-50 minutes each session. Aim for at least 150 minutes.week. Goal should be pace of 3 miles/hours, or walking 1.5 miles in 30 minutes -recommend avoidance of tobacco products. Avoid excess alcohol.  Plan for follow up: ***3 mos or sooner as needed  Buford Dresser, MD, PhD, [facility]    Medication Adjustments/Labs and Tests Ordered: Current medicines are reviewed at length with the patient today.  Concerns regarding medicines are outlined above.   No orders of the defined types were placed in this encounter.  No orders of the defined types were  placed in this encounter.  There are no Patient Instructions on file for this visit.   I,Mathew Stumpf,acting as a Education administrator for [employer], MD.,have documented all relevant documentation on the behalf of Buford Dresser, MD,as directed by  Buford Dresser, MD while in the presence of Buford Dresser, MD.  ***  Signed, Buford Dresser, MD PhD 09/10/2021     [place]

## 2021-09-30 ENCOUNTER — Ambulatory Visit (HOSPITAL_BASED_OUTPATIENT_CLINIC_OR_DEPARTMENT_OTHER): Payer: 59 | Admitting: Cardiology

## 2021-11-01 ENCOUNTER — Other Ambulatory Visit: Payer: Self-pay

## 2021-11-01 ENCOUNTER — Ambulatory Visit (INDEPENDENT_AMBULATORY_CARE_PROVIDER_SITE_OTHER): Payer: 59

## 2021-11-01 ENCOUNTER — Encounter (HOSPITAL_BASED_OUTPATIENT_CLINIC_OR_DEPARTMENT_OTHER): Payer: Self-pay | Admitting: Cardiology

## 2021-11-01 ENCOUNTER — Ambulatory Visit (INDEPENDENT_AMBULATORY_CARE_PROVIDER_SITE_OTHER): Payer: 59 | Admitting: Cardiology

## 2021-11-01 VITALS — BP 140/80 | HR 84 | Ht 72.0 in | Wt 230.7 lb

## 2021-11-01 DIAGNOSIS — I2510 Atherosclerotic heart disease of native coronary artery without angina pectoris: Secondary | ICD-10-CM | POA: Diagnosis not present

## 2021-11-01 DIAGNOSIS — I493 Ventricular premature depolarization: Secondary | ICD-10-CM | POA: Diagnosis not present

## 2021-11-01 DIAGNOSIS — E118 Type 2 diabetes mellitus with unspecified complications: Secondary | ICD-10-CM

## 2021-11-01 DIAGNOSIS — I10 Essential (primary) hypertension: Secondary | ICD-10-CM | POA: Diagnosis not present

## 2021-11-01 DIAGNOSIS — Z720 Tobacco use: Secondary | ICD-10-CM

## 2021-11-01 DIAGNOSIS — I255 Ischemic cardiomyopathy: Secondary | ICD-10-CM

## 2021-11-01 DIAGNOSIS — I739 Peripheral vascular disease, unspecified: Secondary | ICD-10-CM

## 2021-11-01 DIAGNOSIS — I700 Atherosclerosis of aorta: Secondary | ICD-10-CM

## 2021-11-01 NOTE — Progress Notes (Incomplete)
Cardiology Office Note:    Date:  11/01/2021   ID:  Dustin Sampson, DOB [birth year], MRN [id number]  PCP:  [name], PA-C  Cardiologist:  Buford Dresser, MD  Referring MD: [name], PA-C   CC: follow up  History of Present Illness:    Dustin Sampson is a 56 y.o. male with a hx of hypertension, hyperlipidemia, type II diabetes, hypothyroidism, claudication, PAD, currently tobacco use, CVA by MRI,  MGUS who is seen for follow up today. I initially met him 08/11/20 as a new consult at the request of [name], PA-C for the evaluation and management of risk for CAD.  Cardiovascular risk factors: Prior clinical ASCVD: being evaluated for PAD. Concern for prior silent CVA based on MRI Comorbid conditions: Endorses hypertension, hyperlipidemia, diabetes, chronic kidney disease Metabolic syndrome/Obesity: Highest adult weight was 238 lbs, working on weight loss now Tobacco use history: still smoking, working on weaning off. Peak 1 ppd Family history: father had 4-5 stents, no heart attack that he knows of. Mother has afib.   Today: Overall, he is feeling okay. He continues to suffer from constant imbalance, and has fallen several times. He has been told this appears as if he is losing consciousness. He feels as if he loses some clarity and is nauseous at those times, but he does not believe he loses consciousness completely. If he is able to use a cart at work or in [employer], he is able to walk and function without fear of falling.   Also, he sometimes has issues with double vision, spatial orientation, and having difficulty with speaking. Lately he does not feel as present or alert as he used to be. For the first 30-45 minutes in the morning he feels normal and asymptomatic. Then he feels worse for a few hours. Of note, he states that his symptoms are similar to what his father suffered from.   Once in a while he feels as if his heart is beating faster or harder. A  few times he took an extra dose of metoprolol, which helps him feel better.  He endorses mild shortness of breath that he attributes to smoking. He typically smokes in the morning and later in the day.  For years, he has also noticed that after he lies down and bends his knees, he develops feelings of pressure in his LE superior to his knees, as if he has edema.  He denies any chest pain, headaches, orthopnea, or PND.   Past Medical History:  Diagnosis Date   Anxiety    Arthritis    Diabetes mellitus without complication ([place])    Gout    Hyperlipemia    Hypertension    Hypothyroidism    Insomnia    Pneumonia    Stroke [facility])    Thyroid disease     Past Surgical History:  Procedure Laterality Date   ABDOMINAL AORTOGRAM W/LOWER EXTREMITY Bilateral 08/21/2020   Procedure: ABDOMINAL AORTOGRAM W/LOWER EXTREMITY;  Surgeon: Elam Dutch, MD;  Location: [place] [facility];  Service: Cardiovascular;  Laterality: Bilateral;   ABDOMINAL AORTOGRAM W/LOWER EXTREMITY N/A 09/04/2020   Procedure: ABDOMINAL AORTOGRAM W/LOWER EXTREMITY;  Surgeon: Elam Dutch, MD;  Location: [place] [facility];  Service: Cardiovascular;  Laterality: N/A;   COLONOSCOPY     x2   FEMORAL ARTERY EXPLORATION Left 09/04/2020   Procedure: FEMORAL ARTERY EXPLORATION;  Surgeon: Angelia Mould, MD;  Location: [facility];  Service: Vascular;  Laterality: Left;   NASAL SINUS  SURGERY     PERIPHERAL VASCULAR INTERVENTION Left 08/21/2020   Procedure: PERIPHERAL VASCULAR INTERVENTION;  Surgeon: Elam Dutch, MD;  Location: [place] [facility];  Service: Cardiovascular;  Laterality: Left;  sfa stent x 3   PERIPHERAL VASCULAR INTERVENTION  09/04/2020   Procedure: PERIPHERAL VASCULAR INTERVENTION;  Surgeon: Elam Dutch, MD;  Location: [facility];  Service: Cardiovascular;;  Right SFA   SHOULDER ARTHROSCOPY WITH ROTATOR CUFF REPAIR AND SUBACROMIAL DECOMPRESSION Left 02/04/2021   Procedure: SHOULDER  ARTHROSCOPY WITH ROTATOR CUFF REPAIR AND SUBACROMIAL DECOMPRESSION;  Surgeon: Tania Ade, MD;  Location: [facility];  Service: Orthopedics;  Laterality: Left;   WISDOM TOOTH EXTRACTION     WRIST SURGERY     bilat    Current Medications: Current Outpatient Medications on File Prior to Visit  Medication Sig   acetaminophen (TYLENOL) 500 MG tablet Take 500-1,000 mg by mouth every 6 (six) hours as needed (for pain.).   aspirin EC 81 MG tablet Take 1 tablet (81 mg total) by mouth daily. (Patient taking differently: Take 81 mg by mouth every evening.)   atorvastatin (LIPITOR) 80 MG tablet Take 80 mg by mouth every evening.   clopidogrel (PLAVIX) 75 MG tablet Take 1 tablet (75 mg total) by mouth daily.   Febuxostat 80 MG TABS Take 80 mg by mouth every evening.   gabapentin (NEURONTIN) 300 MG capsule Take 300 mg by mouth 3 (three) times daily.   levothyroxine (SYNTHROID, LEVOTHROID) 200 MCG tablet Take 200 mcg by mouth every evening.    losartan (COZAAR) 100 MG tablet Take 100 mg by mouth every evening.    metFORMIN (GLUCOPHAGE-XR) 500 MG 24 hr tablet Take 500 mg by mouth 2 (two) times daily.   metoprolol succinate (TOPROL-XL) 50 MG 24 hr tablet TAKE ONE TABLET BY MOUTH DAILY TAKE WITH OR IMMEDIATELY FOLLOWING A MEAL   sertraline (ZOLOFT) 25 MG tablet Take 1 tablet (25 mg total) by mouth daily.   tadalafil (CIALIS) 20 MG tablet Take 20 mg by mouth daily as needed for erectile dysfunction.    temazepam (RESTORIL) 30 MG capsule Take 30 mg by mouth at bedtime.   tiZANidine (ZANAFLEX) 4 MG tablet Take 1 tablet (4 mg total) by mouth every 8 (eight) hours as needed for muscle spasms.   No current facility-administered medications on file prior to visit.     Allergies:   Aspirin, Avelox [moxifloxacin hcl in nacl], Nsaids, and Penicillins   Social History   Tobacco Use   Smoking status: Every Day    Packs/day: 0.25    Types: Cigarettes   Smokeless tobacco: Former   Tobacco comments:    1/2  PACK A DAY  Vaping Use   Vaping Use: Never used  Substance Use Topics   Alcohol use: Not Currently   Drug use: No    Family History: family history includes Atrial fibrillation in his mother; Autoimmune disease in his sister; Colon cancer in his father; Healthy in his daughter and son; Lung cancer in his father.  ROS:   Please see the history of present illness.   (+) Multiple falls (+) Imbalance (+) Double vision (+) Difficulty speaking (+) Near-syncope (+) Nausea (+) Palpitations (+) Shortness of breath Additional pertinent ROS otherwise unremarkable.   EKGs/Labs/Other Studies Reviewed:    The following studies were reviewed today:  CT Chest 04/19/2021: FINDINGS: Cardiovascular: There is mild calcified atherosclerotic change in the aortic arch. No thoracic aortic aneurysm. The pulmonary arteries are normal in caliber. Calcified  atherosclerosis is seen in the left coronary arteries. Heart size is normal.   Mediastinum/Nodes: No enlarged mediastinal or axillary lymph nodes. Thyroid gland, trachea, and esophagus demonstrate no significant findings.   Lungs/Pleura: Central airways are normal. No pneumothorax. Mild paraseptal emphysematous changes. The 5 mm nodule on series 3, image 41 of the previous study has resolved. There is a nodule in the right lung on series 8, image 69 measuring 5 mm, unchanged. There is a subtle nodule in the left upper lobe anterior to the fissure on series 8, image 38, unchanged. No other suspicious solid nodules. Innumerable tiny centrilobular pulmonary nodules remain remain. No masses or infiltrates.   Upper Abdomen: No acute abnormality.   Musculoskeletal: No chest wall mass or suspicious bone lesions identified.   IMPRESSION: 1. The 5 mm nodule in the right upper lobe on the previous study has resolved. There is a 5 mm nodule on series 8, image 69 on the right and another smaller nodule in the left upper lobe on series 8, image 38  measuring less than 5 mm. Recommend a follow-up CT scan in 1 year to establish at least 2 years of stability. 2. Innumerable tiny centrilobular pulmonary nodules may be smoking related, representing respiratory bronchiolitis, and are similar in the interval. Recommend clinical correlation. 3. Calcified atherosclerosis in the left coronary arteries as above.   Aortic Atherosclerosis (ICD10-I70.0).  Bilateral LE Arterial Duplex 02/12/2021: Summary:  Right: Patent stent with no evidence of stenosis in the proximal  superficial femoral artery to distal superficial femoral artery.   Left: Patent stent with no evidence of stenosis in the proximal  superficial femoral artery to distal superficial femoral artery.   ABI 02/12/2021: Previous ABI pre op on 07/30/20.     Summary:  Right: Resting right ankle-brachial index is within normal range. The  right toe-brachial index is normal. RT great toe pressure = 135 mmHg.   Left: Resting left ankle-brachial index is within normal range. The left  toe-brachial index is normal. LT Great toe pressure = 138 mmHg.   Echo 09/21/20 1. Left ventricular ejection fraction, by estimation, is 45%. The left  ventricle has mildly decreased function. The left ventricle demonstrates  regional wall motion abnormalities with inferoseptal and inferior  hypokinesis. Left ventricular diastolic  parameters are consistent with Grade I diastolic dysfunction (impaired  relaxation).   2. Right ventricular systolic function is normal. The right ventricular  size is normal. Tricuspid regurgitation signal is inadequate for assessing  PA pressure.   3. The mitral valve is normal in structure. No evidence of mitral valve  regurgitation. No evidence of mitral stenosis.   4. The aortic valve is tricuspid. Aortic valve regurgitation is not  visualized. No aortic stenosis is present.   5. The inferior vena cava is normal in size with greater than 50%  respiratory variability,  suggesting right atrial pressure of 3 mmHg.  Lexiscan myoview 08/18/20 There was no ST segment deviation noted during stress. The left ventricular ejection fraction is mildly decreased (45-54%). Nuclear stress EF: 45%. Defect 1: There is a medium defect of moderate severity present in the basal inferoseptal, basal inferior, mid inferoseptal, mid inferior and apical inferior location. Findings consistent with prior myocardial infarction with peri-infarct ischemia. This is an intermediate risk study.   1. Partially reversible inferior/inferoseptal perfusion defect suggestive of infarct with peri-infarct ischemia 2. Mild systolic dysfunction (EF 79%) 3. Intermediate risk study   ABI 07/30/20 Right: Resting right ankle-brachial index indicates moderate right lower  extremity arterial disease. The right toe-brachial index is abnormal.   Left: Resting left ankle-brachial index indicates moderate left lower  extremity arterial disease. The left toe-brachial index is abnormal.  Carotids 07/31/20 Right Carotid: Velocities in the right ICA are consistent with a 1-39%  stenosis.   Left Carotid: Velocities in the left ICA are consistent with a 40-59%  stenosis.   CT chest WO contrast 06/17/19 personally reviewed, aortic calcium consistent with aortic atherosclerosis, coronaries not well seen/cannot exclude calcium   EKG:  EKG is personally reviewed.   11/01/2021: *** 08/11/20: NSR at 92 bpm  Recent Labs: 01/19/2021: ALT 18; TSH 2.61 02/01/2021: BUN 19; Creatinine, Ser 0.96; Potassium 4.2; Sodium 135 03/03/2021: Hemoglobin 14.3; Platelets 337   Recent Lipid Panel No results found for: CHOL, TRIG, HDL, CHOLHDL, VLDL, LDLCALC, LDLDIRECT  Physical Exam:    VS:  BP 140/80    Pulse 84    Ht 6' (1.829 m)    Wt 230 lb 11.2 oz (104.6 kg)    SpO2 96%    BMI 31.29 kg/m     Wt Readings from Last 3 Encounters:  11/01/21 230 lb 11.2 oz (104.6 kg)  06/16/21 219 lb 3.2 oz (99.4 kg)  05/03/21 217 lb  12.8 oz (98.8 kg)    GEN: Well nourished, well developed in no acute distress HEENT: Normal, moist mucous membranes NECK: No JVD CARDIAC: regular rhythm, ***occasional early beat, normal S1 and S2, no rubs or gallops. ***1/6 systolic murmur VASCULAR: Radial pulses 2+ bilaterally. No carotid bruits RESPIRATORY:  ***Distant with long expiratory phase but no wheezing appreciated ABDOMEN: Soft, non-tender, non-distended MUSCULOSKELETAL:  Ambulates independently SKIN: Warm and dry, no edema NEUROLOGIC:  Alert and oriented x 3. No focal neuro deficits noted. PSYCHIATRIC:  Normal affect   ASSESSMENT:    1. Frequent PVCs   2. Presumed ischemic cardiomyopathy   3. Presumed CAD   4. Primary hypertension     PLAN:    Aortic atherosclerosis Claudication, with moderate PAD in lower extremities by ABI Prior CVA by MRI Hypertension Type II diabetes with hyperlipidemia Ongoing tobacco use -BP elevated today, but recently well controlled at nephrology visit. Continue to monitor -continue aspirin 81 mg, clopidogrel 75 mg, atorvastatin 80 mg for ASCVD risk/PAD. Last LDL 68, but TG 310 (unknown if fasting) -continue losartan for diabetes/hypertension -consider SGLT2i or GLP1RA in the future -counseled on role of smoking and counseled to quit completely -discussed red flag warning signs that need immediate medical attention  Cardiomyopathy -we reviewed his ischemia workup to date. His nuclear stress was abnormal, and his EF is about 45% -continue metoprolol succinate and losartan -he has a history of prior kidney disease, though recent labs encouraging. Would need to discuss ARNI or MRA with his nephrologist -I am suspicious that this is ischemic in nature. We discussed defining anatomy by cath. We also reviewed the indications for stenting or CABG. He is feeling well on current medicines. After shared decision making, will proceed with medical management for now, but discussed what would move [name]  to perform a cath. -working on increasing his activity now that his leg pain is improved  Tobacco abuse counseling: The patient was counseled on tobacco cessation today for 3 minutes.  Counseling included reviewing the risks of smoking tobacco products, how it impacts the patient's current medical diagnoses and different strategies for quitting.  Pharmacotherapy to aid in tobacco cessation was not prescribed today.  Cardiac risk counseling and prevention recommendations: -recommend heart healthy/Mediterranean diet, with  whole grains, fruits, vegetable, fish, lean meats, nuts, and olive oil. Limit salt. -recommend moderate walking, 3-5 times/week for 30-50 minutes each session. Aim for at least 150 minutes.week. Goal should be pace of 3 miles/hours, or walking 1.5 miles in 30 minutes -recommend avoidance of tobacco products. Avoid excess alcohol.  Plan for follow up: 6 weeks following scheduled appointment with neurology at [facility], or sooner as needed  Buford Dresser, MD, PhD, [facility]    Medication Adjustments/Labs and Tests Ordered: Current medicines are reviewed at length with the patient today.  Concerns regarding medicines are outlined above.   No orders of the defined types were placed in this encounter.  No orders of the defined types were placed in this encounter.  There are no Patient Instructions on file for this visit.   I,Mathew Stumpf,acting as a Education administrator for [employer], MD.,have documented all relevant documentation on the behalf of Buford Dresser, MD,as directed by  Buford Dresser, MD while in the presence of Buford Dresser, MD.  ***  Signed, Buford Dresser, MD PhD 11/01/2021     [place]

## 2021-11-01 NOTE — Progress Notes (Unsigned)
Enrolled for [employer] to mail a ZIO XT long term holter monitor to the patients address on file.  

## 2021-11-01 NOTE — Patient Instructions (Signed)
Medication Instructions:  Your Physician recommend you continue on your current medication as directed.    *If you need a refill on your cardiac medications before your next appointment, please call your pharmacy*   Lab Work: None ordered today   Testing/Procedures: Our physician has recommended that you wear an [place] monitor. The Zio patch cardiac monitor continuously records heart rhythm data for up to 14 days, this is for patients being evaluated for multiple types heart rhythms. For the first 24 hours post application, please avoid getting the Zio monitor wet in the shower or by excessive sweating during exercise. After that, feel free to carry on with regular activities. Keep soaps and lotions away from the ZIO XT Patch.      Follow-Up: At [facility], you and your health needs are our priority.  As part of our continuing mission to provide you with exceptional heart care, we have created designated Provider Care Teams.  These Care Teams include your primary Cardiologist (physician) and Advanced Practice Providers (APPs -  Physician Assistants and Nurse Practitioners) who all work together to provide you with the care you need, when you need it.  We recommend signing up for the patient portal called "MyChart".  Sign up information is provided on this After Visit Summary.  MyChart is used to connect with patients for Virtual Visits (Telemedicine).  Patients are able to view lab/test results, encounter notes, upcoming appointments, etc.  Non-urgent messages can be sent to your provider as well.   To learn more about what you can do with MyChart, go to [email or web address].    Your next appointment:   6 week(s)  The format for your next appointment:   In Person  Provider:   Buford Dresser, MD   [place] Instructions  Your physician has requested you wear a ZIO patch monitor for 7 days.  This is a single patch monitor. [employer] supplies one  patch monitor per enrollment. Additional stickers are not available. Please do not apply patch if you will be having a Nuclear Stress Test,  Echocardiogram, Cardiac CT, MRI, or Chest Xray during the period you would be wearing the  monitor. The patch cannot be worn during these tests. You cannot remove and re-apply the  ZIO XT patch monitor.  Your ZIO patch monitor will be mailed 3 day USPS to your address on file. It may take 3-5 days  to receive your monitor after you have been enrolled.  Once you have received your monitor, please review the enclosed instructions. Your monitor  has already been registered assigning a specific monitor serial # to you.  Billing and Patient Assistance Program Information  We have supplied [employer] with any of your insurance information on file for billing purposes. [employer] offers a sliding scale Patient Assistance Program for patients that do not have  insurance, or whose insurance does not completely cover the cost of the ZIO monitor.  You must apply for the Patient Assistance Program to qualify for this discounted rate.  To apply, please call [employer] at [phone], select option 4, select option 2, ask to apply for  Patient Assistance Program. Theodore Demark will ask your household income, and how many people  are in your household. They will quote your out-of-pocket cost based on that information.  [employer] will also be able to set up a 61-month, interest-free payment plan if needed.  Applying the monitor   Shave hair from upper left chest.  Hold abrader disc by orange  tab. Rub abrader in 40 strokes over the upper left chest as  indicated in your monitor instructions.  Clean area with 4 enclosed alcohol pads. Let dry.  Apply patch as indicated in monitor instructions. Patch will be placed under collarbone on left  side of chest with arrow pointing upward.  Rub patch adhesive wings for 2 minutes. Remove white label marked "1". Remove the white  label marked  "2". Rub patch adhesive wings for 2 additional minutes.  While looking in a mirror, press and release button in center of patch. A small green light will  flash 3-4 times. This will be your only indicator that the monitor has been turned on.  Do not shower for the first 24 hours. You may shower after the first 24 hours.  Press the button if you feel a symptom. You will hear a small click. Record Date, Time and  Symptom in the Patient Logbook.  When you are ready to remove the patch, follow instructions on the last 2 pages of Patient  Logbook. Stick patch monitor onto the last page of Patient Logbook.  Place Patient Logbook in the blue and white box. Use locking tab on box and tape box closed  securely. The blue and white box has prepaid postage on it. Please place it in the mailbox as  soon as possible. Your physician should have your test results approximately 7 days after the  monitor has been mailed back to [facility].  Call [place] at [phone] if you have questions regarding  your ZIO XT patch monitor. Call them immediately if you see an orange light blinking on your  monitor.  If your monitor falls off in less than 4 days, contact our Monitor department at [phone].  If your monitor becomes loose or falls off after 4 days call [employer] at [phone] for  suggestions on securing your monitor

## 2021-11-05 DIAGNOSIS — I493 Ventricular premature depolarization: Secondary | ICD-10-CM | POA: Diagnosis not present

## 2021-11-08 IMAGING — CT CT CHEST W/O CM
2 of 4 series · 15 of 36 positions shown, 18 images · non-contrast
Comparison: June 17, 2019

CLINICAL DATA: Follow-up pulmonary nodules

EXAM:
CT CHEST WITHOUT CONTRAST
TECHNIQUE: Multidetector CT imaging of the chest was performed following the
standard protocol without IV contrast.

[Series 2: chest 2.00 br40 s3 · axial · 0.79mm/px · z∈[+1570,+1860]mm · 12 of 173 slices shown, 15 images (1 of 2)]
[im 14/173  mediastinal]
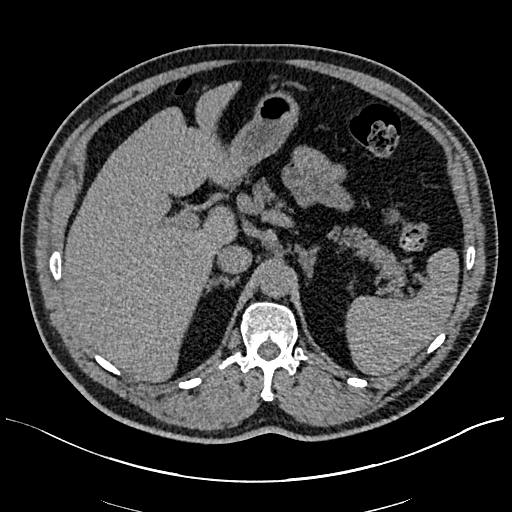
[im 14/173  lung]
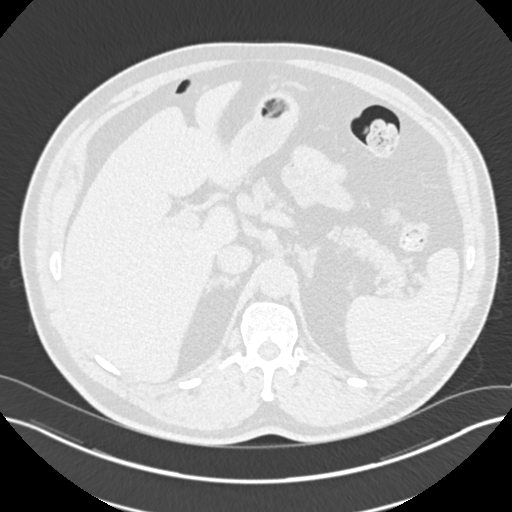
[im 27/173  lung]
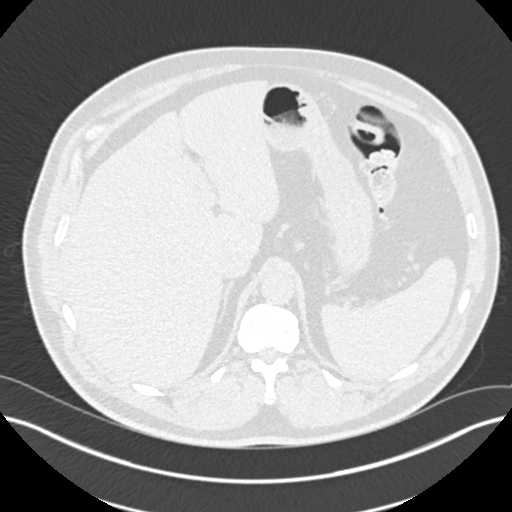
[im 40/173  lung]
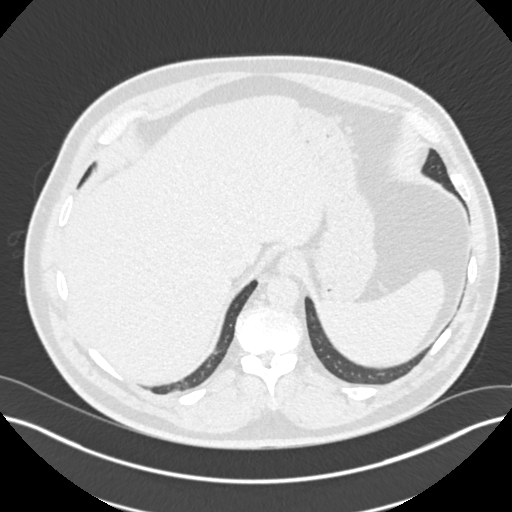
[im 53/173  lung]
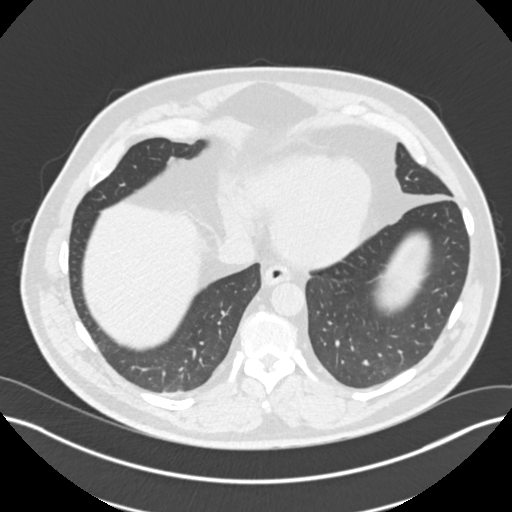
[im 67/173  mediastinal]
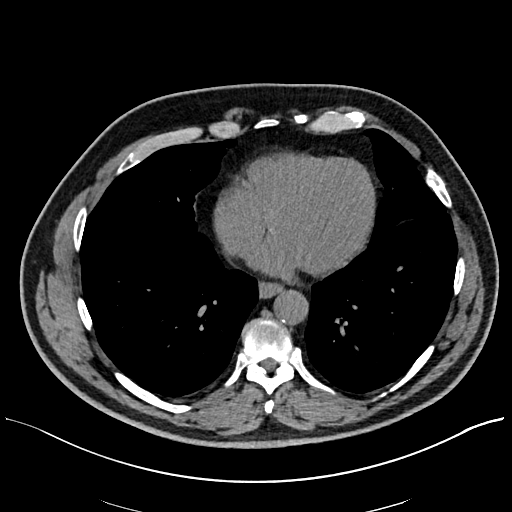
[im 67/173  lung]
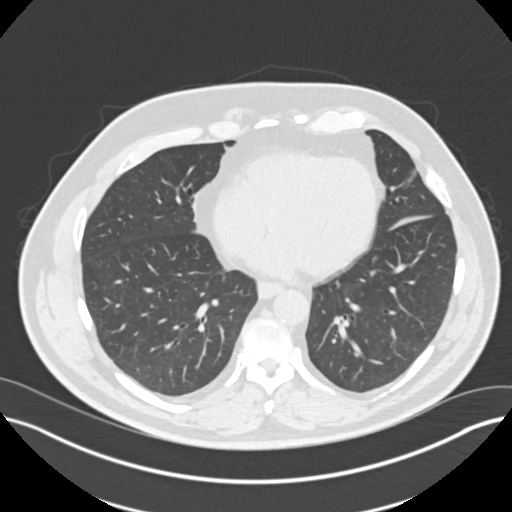
[im 80/173  lung]
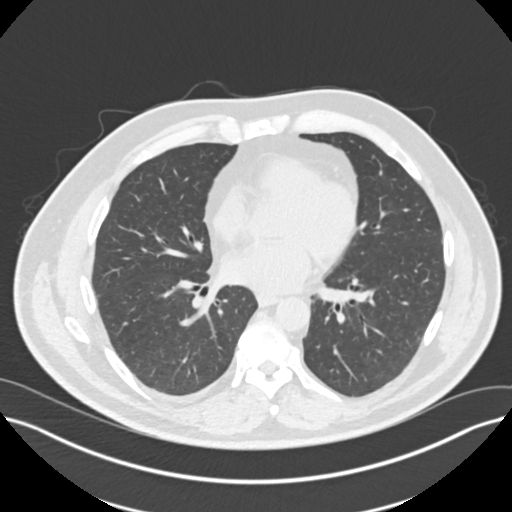
[im 93/173  lung]
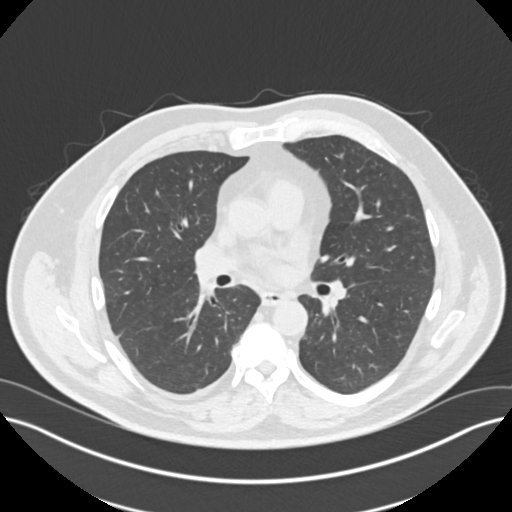
[im 106/173  lung]
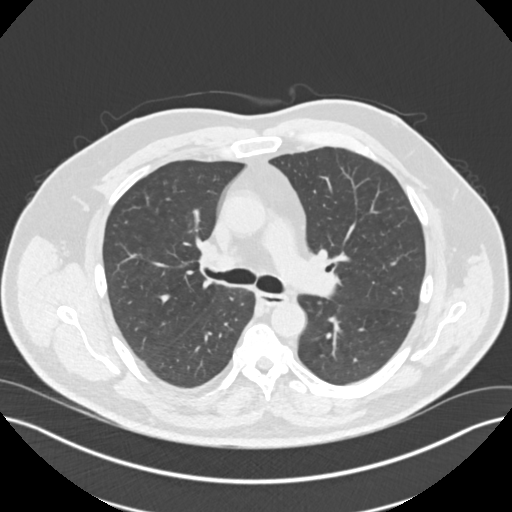
[im 120/173  mediastinal]
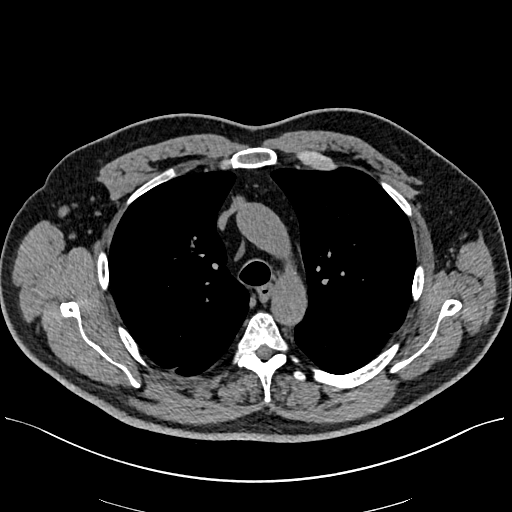
[im 120/173  lung]
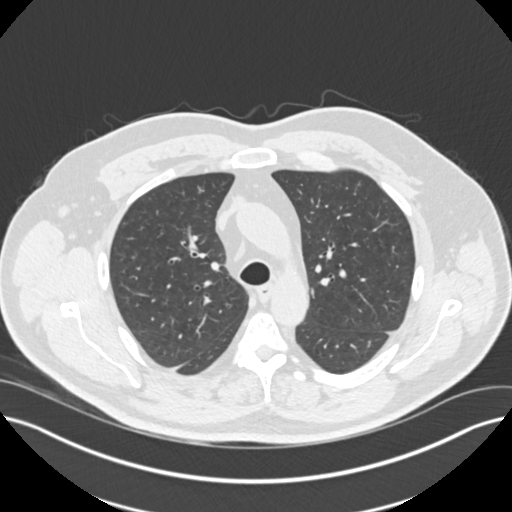
[im 133/173  lung]
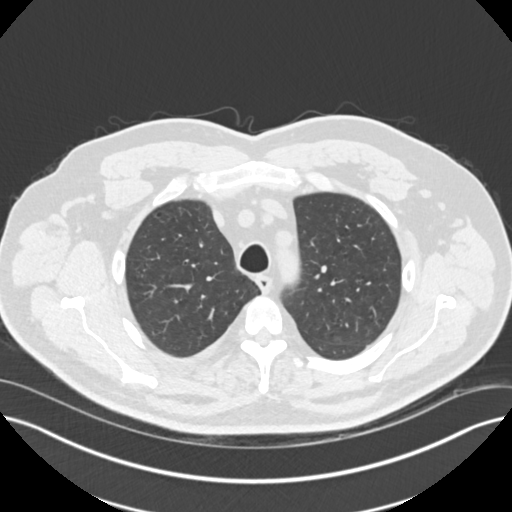
[im 146/173  lung]
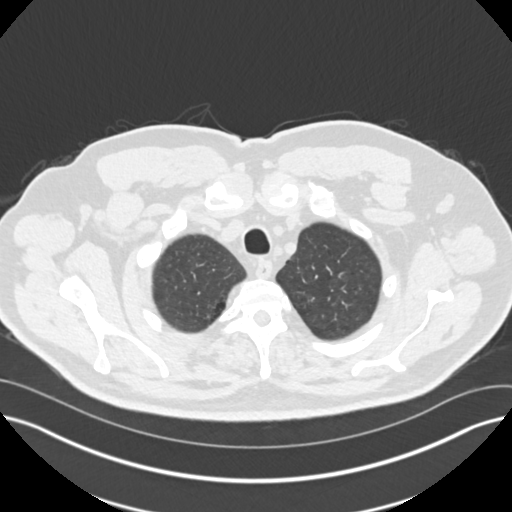
[im 159/173  lung]
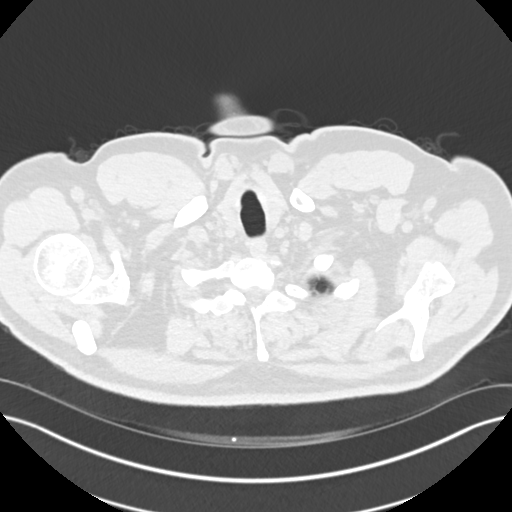

[Series 4: chest 2.00 br40 s3 · coronal · 0.68mm/px · 3 of 202 slices shown (2 of 2)]
[im 41/202  lung]
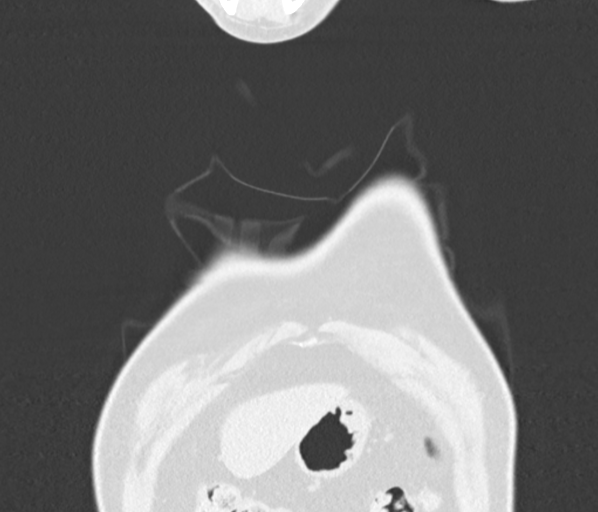
[im 81/202  lung]
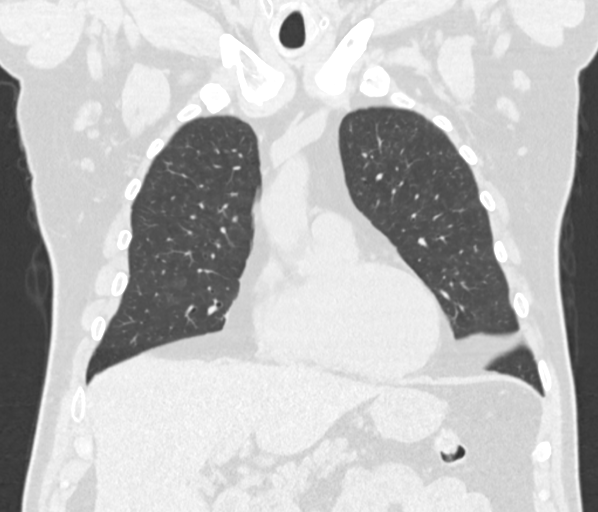
[im 121/202  lung]
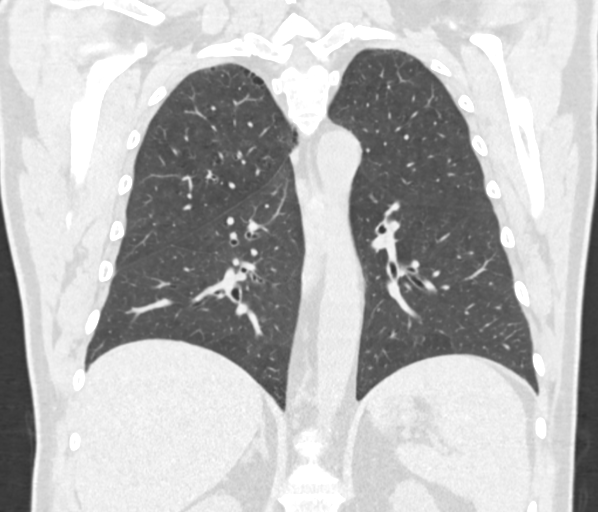

[15 of 36 positions shown; findings below may reference images not displayed]

FINDINGS: Cardiovascular: There is mild calcified atherosclerotic change in
the aortic arch. No thoracic aortic aneurysm. The pulmonary arteries
are normal in caliber. Calcified atherosclerosis is seen in the left
coronary arteries. Heart size is normal.

Mediastinum/Nodes: No enlarged mediastinal or axillary lymph nodes.
Thyroid gland, trachea, and esophagus demonstrate no significant
findings.

Lungs/Pleura: Central airways are normal. No pneumothorax. Mild
paraseptal emphysematous changes. The 5 mm nodule on series 3, image
41 of the previous study has resolved. There is a nodule in the
right lung on series 8, image 69 measuring 5 mm, unchanged. There is
a subtle nodule in the left upper lobe anterior to the fissure on
series 8, image 38, unchanged. No other suspicious solid nodules.
Innumerable tiny centrilobular pulmonary nodules remain remain. No
masses or infiltrates.

Upper Abdomen: No acute abnormality.

Musculoskeletal: No chest wall mass or suspicious bone lesions
identified.
IMPRESSION: 1. The 5 mm nodule in the right upper lobe on the previous study has
resolved. There is a 5 mm nodule on series 8, image 69 on the right
and another smaller nodule in the left upper lobe on series 8, image
38 measuring less than 5 mm. Recommend a follow-up CT scan in 1 year
to establish at least 2 years of stability.
2. Innumerable tiny centrilobular pulmonary nodules may be smoking
related, representing respiratory bronchiolitis, and are similar in
the interval. Recommend clinical correlation.
3. Calcified atherosclerosis in the left coronary arteries as above.

Aortic Atherosclerosis ([id number]).

## 2021-12-13 ENCOUNTER — Ambulatory Visit (INDEPENDENT_AMBULATORY_CARE_PROVIDER_SITE_OTHER): Payer: 59 | Admitting: Cardiology

## 2021-12-13 ENCOUNTER — Other Ambulatory Visit: Payer: Self-pay

## 2021-12-13 VITALS — BP 110/78 | HR 92 | Ht 72.0 in | Wt 225.8 lb

## 2021-12-13 DIAGNOSIS — I493 Ventricular premature depolarization: Secondary | ICD-10-CM | POA: Diagnosis not present

## 2021-12-13 DIAGNOSIS — Z720 Tobacco use: Secondary | ICD-10-CM

## 2021-12-13 DIAGNOSIS — E118 Type 2 diabetes mellitus with unspecified complications: Secondary | ICD-10-CM

## 2021-12-13 DIAGNOSIS — I255 Ischemic cardiomyopathy: Secondary | ICD-10-CM

## 2021-12-13 DIAGNOSIS — I739 Peripheral vascular disease, unspecified: Secondary | ICD-10-CM

## 2021-12-13 DIAGNOSIS — I700 Atherosclerosis of aorta: Secondary | ICD-10-CM

## 2021-12-13 DIAGNOSIS — I2510 Atherosclerotic heart disease of native coronary artery without angina pectoris: Secondary | ICD-10-CM | POA: Diagnosis not present

## 2021-12-13 DIAGNOSIS — I10 Essential (primary) hypertension: Secondary | ICD-10-CM

## 2021-12-13 NOTE — Patient Instructions (Signed)

## 2021-12-13 NOTE — Progress Notes (Incomplete)
Cardiology Office Note:    Date:  12/13/2021   ID:  Dustin Sampson, DOB Mar 12, 1966, MRN [id number]  PCP:  [name], PA-C  Cardiologist:  Buford Dresser, MD  Referring MD: [name], PA-C   CC: follow up  History of Present Illness:    Dustin Sampson is a 56 y.o. male with a hx of hypertension, hyperlipidemia, type II diabetes, hypothyroidism, claudication, PAD, currently tobacco use, CVA by MRI,  MGUS who is seen for follow up today. I initially met him 08/11/20 as a new consult at the request of [name], PA-C for the evaluation and management of risk for CAD.  Cardiovascular risk factors: Prior clinical ASCVD: being evaluated for PAD. Concern for prior silent CVA based on MRI Comorbid conditions: Endorses hypertension, hyperlipidemia, diabetes, chronic kidney disease Metabolic syndrome/Obesity: Highest adult weight was 238 lbs, working on weight loss now Tobacco use history: still smoking, working on weaning off. Peak 1 ppd Family history: father had 4-5 stents, no heart attack that he knows of. Mother has afib.   Today: Overall, he is feeling fine with no new complaints. Sometimes, his blood pressure has been low for him. Aside from a few isolated minor episodes, he generally has not had issues with palpitations. He will take metoprolol as needed.  Once in a while he suffers from a little bit of chest tightness, but nothing that overly concerns him.  Every once in a while he will develop mild dizziness/lightheadedness. He denies any syncopal episodes.   For exercise, he has walked 2 miles for 4-5 times. He continues to suffer from imbalance and some shortness of breath. Since his stent placements the pain in his legs has improved. Once in a blue moon he may develop LE edema, but he would attribute this to eating salty foods when it occurs.  He continues to work on quitting smoking and losing weight.  He denies any headaches, orthopnea, or PND. No hematuria  or hematochezia.  Of note, he has followed up with neurology at [facility] and currently awaiting results for genetic testing.    Past Medical History:  Diagnosis Date   Anxiety    Arthritis    Diabetes mellitus without complication ([place])    Gout    Hyperlipemia    Hypertension    Hypothyroidism    Insomnia    Pneumonia    Stroke [facility])    Thyroid disease     Past Surgical History:  Procedure Laterality Date   ABDOMINAL AORTOGRAM W/LOWER EXTREMITY Bilateral 08/21/2020   Procedure: ABDOMINAL AORTOGRAM W/LOWER EXTREMITY;  Surgeon: Elam Dutch, MD;  Location: [place] [facility];  Service: Cardiovascular;  Laterality: Bilateral;   ABDOMINAL AORTOGRAM W/LOWER EXTREMITY N/A 09/04/2020   Procedure: ABDOMINAL AORTOGRAM W/LOWER EXTREMITY;  Surgeon: Elam Dutch, MD;  Location: [place] [facility];  Service: Cardiovascular;  Laterality: N/A;   COLONOSCOPY     x2   FEMORAL ARTERY EXPLORATION Left 09/04/2020   Procedure: FEMORAL ARTERY EXPLORATION;  Surgeon: Angelia Mould, MD;  Location: [facility] OR;  Service: Vascular;  Laterality: Left;   NASAL SINUS SURGERY     PERIPHERAL VASCULAR INTERVENTION Left 08/21/2020   Procedure: PERIPHERAL VASCULAR INTERVENTION;  Surgeon: Elam Dutch, MD;  Location: [place] [facility];  Service: Cardiovascular;  Laterality: Left;  sfa stent x 3   PERIPHERAL VASCULAR INTERVENTION  09/04/2020   Procedure: PERIPHERAL VASCULAR INTERVENTION;  Surgeon: Elam Dutch, MD;  Location: [place] [facility];  Service: Cardiovascular;;  Right  SFA   SHOULDER ARTHROSCOPY WITH ROTATOR CUFF REPAIR AND SUBACROMIAL DECOMPRESSION Left 02/04/2021   Procedure: SHOULDER ARTHROSCOPY WITH ROTATOR CUFF REPAIR AND SUBACROMIAL DECOMPRESSION;  Surgeon: Tania Ade, MD;  Location: [facility];  Service: Orthopedics;  Laterality: Left;   WISDOM TOOTH EXTRACTION     WRIST SURGERY     bilat    Current Medications: Current Outpatient Medications on File Prior to Visit   Medication Sig   acetaminophen (TYLENOL) 500 MG tablet Take 500-1,000 mg by mouth every 6 (six) hours as needed (for pain.).   aspirin EC 81 MG tablet Take 1 tablet (81 mg total) by mouth daily. (Patient taking differently: Take 81 mg by mouth every evening.)   atorvastatin (LIPITOR) 80 MG tablet Take 80 mg by mouth every evening.   clopidogrel (PLAVIX) 75 MG tablet Take 1 tablet (75 mg total) by mouth daily.   Febuxostat 80 MG TABS Take 80 mg by mouth every evening.   gabapentin (NEURONTIN) 300 MG capsule Take 300 mg by mouth 3 (three) times daily.   levothyroxine (SYNTHROID, LEVOTHROID) 200 MCG tablet Take 200 mcg by mouth every evening.    losartan (COZAAR) 100 MG tablet Take 100 mg by mouth every evening.    metFORMIN (GLUCOPHAGE-XR) 500 MG 24 hr tablet Take 500 mg by mouth 2 (two) times daily.   metoprolol succinate (TOPROL-XL) 50 MG 24 hr tablet TAKE ONE TABLET BY MOUTH DAILY TAKE WITH OR IMMEDIATELY FOLLOWING A MEAL   sertraline (ZOLOFT) 25 MG tablet Take 1 tablet (25 mg total) by mouth daily.   tadalafil (CIALIS) 20 MG tablet Take 20 mg by mouth daily as needed for erectile dysfunction.    temazepam (RESTORIL) 30 MG capsule Take 30 mg by mouth at bedtime.   tiZANidine (ZANAFLEX) 4 MG tablet Take 1 tablet (4 mg total) by mouth every 8 (eight) hours as needed for muscle spasms.   No current facility-administered medications on file prior to visit.     Allergies:   Aspirin, Avelox [moxifloxacin hcl in nacl], Nsaids, and Penicillins   Social History   Tobacco Use   Smoking status: Every Day    Packs/day: 0.25    Types: Cigarettes   Smokeless tobacco: Former   Tobacco comments:    1/2 PACK A DAY  Vaping Use   Vaping Use: Never used  Substance Use Topics   Alcohol use: Not Currently   Drug use: No    Family History: family history includes Atrial fibrillation in his mother; Autoimmune disease in his sister; Colon cancer in his father; Healthy in his daughter and son; Lung  cancer in his father.  ROS:   Please see the history of present illness.   (+) Chest tightness (+) Dizziness/Lightheadedness (+) Shortness of breath (+) Imbalance Additional pertinent ROS otherwise unremarkable.   EKGs/Labs/Other Studies Reviewed:    The following studies were reviewed today:  Monitor 10/2021: Patch Wear Time:  6 days and 11 hours    Patient had a min HR of 70 bpm, max HR of 182 bpm, and avg HR of 89 bpm. Predominant underlying rhythm was Sinus Rhythm. No atrial fibrillation, high degree block, or pauses noted. 1 run of Ventricular Tachycardia occurred lasting 5 beats with a max rate of 154 bpm (avg 150 bpm). 9 Supraventricular Tachycardia runs occurred, the run with the fastest interval lasting 9 beats with a max rate of 182 bpm, the longest lasting 12.2 secs with an avg rate of 144 bpm. Isolated SVEs were rare (<1.0%), SVE  Couplets were rare (<1.0%), and no SVE Triplets were present. Isolated VEs were frequent (10.3%, [id number]), VE Couplets were rare (<1.0%, 301), and VE Triplets were rare (<1.0%, 12). Ventricular Bigeminy and Trigeminy were present. There were 13 triggered events. These were sinus with or without ventricular ectopy.  CT Chest 04/19/2021: FINDINGS: Cardiovascular: There is mild calcified atherosclerotic change in the aortic arch. No thoracic aortic aneurysm. The pulmonary arteries are normal in caliber. Calcified atherosclerosis is seen in the left coronary arteries. Heart size is normal.   Mediastinum/Nodes: No enlarged mediastinal or axillary lymph nodes. Thyroid gland, trachea, and esophagus demonstrate no significant findings.   Lungs/Pleura: Central airways are normal. No pneumothorax. Mild paraseptal emphysematous changes. The 5 mm nodule on series 3, image 41 of the previous study has resolved. There is a nodule in the right lung on series 8, image 69 measuring 5 mm, unchanged. There is a subtle nodule in the left upper lobe anterior to the  fissure on series 8, image 38, unchanged. No other suspicious solid nodules. Innumerable tiny centrilobular pulmonary nodules remain remain. No masses or infiltrates.   Upper Abdomen: No acute abnormality.   Musculoskeletal: No chest wall mass or suspicious bone lesions identified.   IMPRESSION: 1. The 5 mm nodule in the right upper lobe on the previous study has resolved. There is a 5 mm nodule on series 8, image 69 on the right and another smaller nodule in the left upper lobe on series 8, image 38 measuring less than 5 mm. Recommend a follow-up CT scan in 1 year to establish at least 2 years of stability. 2. Innumerable tiny centrilobular pulmonary nodules may be smoking related, representing respiratory bronchiolitis, and are similar in the interval. Recommend clinical correlation. 3. Calcified atherosclerosis in the left coronary arteries as above.   Aortic Atherosclerosis (ICD10-I70.0).  Bilateral LE Arterial Duplex 02/12/2021: Summary:  Right: Patent stent with no evidence of stenosis in the proximal  superficial femoral artery to distal superficial femoral artery.   Left: Patent stent with no evidence of stenosis in the proximal  superficial femoral artery to distal superficial femoral artery.   ABI 02/12/2021: Previous ABI pre op on 07/30/20.     Summary:  Right: Resting right ankle-brachial index is within normal range. The  right toe-brachial index is normal. RT great toe pressure = 135 mmHg.   Left: Resting left ankle-brachial index is within normal range. The left  toe-brachial index is normal. LT Great toe pressure = 138 mmHg.   Echo 09/21/20 1. Left ventricular ejection fraction, by estimation, is 45%. The left  ventricle has mildly decreased function. The left ventricle demonstrates  regional wall motion abnormalities with inferoseptal and inferior  hypokinesis. Left ventricular diastolic  parameters are consistent with Grade I diastolic dysfunction  (impaired  relaxation).   2. Right ventricular systolic function is normal. The right ventricular  size is normal. Tricuspid regurgitation signal is inadequate for assessing  PA pressure.   3. The mitral valve is normal in structure. No evidence of mitral valve  regurgitation. No evidence of mitral stenosis.   4. The aortic valve is tricuspid. Aortic valve regurgitation is not  visualized. No aortic stenosis is present.   5. The inferior vena cava is normal in size with greater than 50%  respiratory variability, suggesting right atrial pressure of 3 mmHg.  Lexiscan myoview 08/18/20 There was no ST segment deviation noted during stress. The left ventricular ejection fraction is mildly decreased (45-54%). Nuclear stress EF: 45%. Defect 1:  There is a medium defect of moderate severity present in the basal inferoseptal, basal inferior, mid inferoseptal, mid inferior and apical inferior location. Findings consistent with prior myocardial infarction with peri-infarct ischemia. This is an intermediate risk study.   1. Partially reversible inferior/inferoseptal perfusion defect suggestive of infarct with peri-infarct ischemia 2. Mild systolic dysfunction (EF 42%) 3. Intermediate risk study   ABI 07/30/20 Right: Resting right ankle-brachial index indicates moderate right lower  extremity arterial disease. The right toe-brachial index is abnormal.   Left: Resting left ankle-brachial index indicates moderate left lower  extremity arterial disease. The left toe-brachial index is abnormal.  Carotids 07/31/20 Right Carotid: Velocities in the right ICA are consistent with a 1-39%  stenosis.   Left Carotid: Velocities in the left ICA are consistent with a 40-59%  stenosis.   CT chest WO contrast 06/17/19 personally reviewed, aortic calcium consistent with aortic atherosclerosis, coronaries not well seen/cannot exclude calcium   EKG:  EKG is personally reviewed.   12/13/2021: EKG was not  ordered. 11/01/2021: *** 08/11/20: NSR at 92 bpm  Recent Labs: 01/19/2021: ALT 18; TSH 2.61 02/01/2021: BUN 19; Creatinine, Ser 0.96; Potassium 4.2; Sodium 135 03/03/2021: Hemoglobin 14.3; Platelets 337   Recent Lipid Panel No results found for: CHOL, TRIG, HDL, CHOLHDL, VLDL, LDLCALC, LDLDIRECT  Physical Exam:    VS:  BP 110/78 (BP Location: Left Arm, Patient Position: Sitting, Cuff Size: Normal)    Pulse 92    Ht 6' (1.829 m)    Wt 225 lb 12.8 oz (102.4 kg)    SpO2 92%    BMI 30.62 kg/m     Wt Readings from Last 3 Encounters:  12/13/21 225 lb 12.8 oz (102.4 kg)  11/01/21 230 lb 11.2 oz (104.6 kg)  06/16/21 219 lb 3.2 oz (99.4 kg)    GEN: Well nourished, well developed in no acute distress HEENT: Normal, moist mucous membranes NECK: No JVD CARDIAC: regular rhythm, ***occasional early beat, normal S1 and S2, no rubs or gallops. ***1/6 systolic murmur VASCULAR: Radial pulses 2+ bilaterally. No carotid bruits RESPIRATORY:  ***Distant with long expiratory phase but no wheezing appreciated ABDOMEN: Soft, non-tender, non-distended MUSCULOSKELETAL:  Ambulates independently SKIN: Warm and dry, no edema NEUROLOGIC:  Alert and oriented x 3. No focal neuro deficits noted. PSYCHIATRIC:  Normal affect   ASSESSMENT:    No diagnosis found.   PLAN:    Aortic atherosclerosis Claudication, with moderate PAD in lower extremities by ABI Prior CVA by MRI Hypertension Type II diabetes with hyperlipidemia Ongoing tobacco use -BP elevated today, but recently well controlled at nephrology visit. Continue to monitor -continue aspirin 81 mg, clopidogrel 75 mg, atorvastatin 80 mg for ASCVD risk/PAD. Last LDL 68, but TG 310 (unknown if fasting) -continue losartan for diabetes/hypertension -consider SGLT2i or GLP1RA in the future -counseled on role of smoking and counseled to quit completely -discussed red flag warning signs that need immediate medical attention  Cardiomyopathy -we reviewed his  ischemia workup to date. His nuclear stress was abnormal, and his EF is about 45% -continue metoprolol succinate and losartan -he has a history of prior kidney disease, though recent labs encouraging. Would need to discuss ARNI or MRA with his nephrologist -I am suspicious that this is ischemic in nature. We discussed defining anatomy by cath. We also reviewed the indications for stenting or CABG. He is feeling well on current medicines. After shared decision making, will proceed with medical management for now, but discussed what would move [name] to perform a cath. -  working on increasing his activity now that his leg pain is improved  Tobacco abuse counseling: The patient was counseled on tobacco cessation today for 3 minutes.  Counseling included reviewing the risks of smoking tobacco products, how it impacts the patient's current medical diagnoses and different strategies for quitting.  Pharmacotherapy to aid in tobacco cessation was not prescribed today.  Cardiac risk counseling and prevention recommendations: -recommend heart healthy/Mediterranean diet, with whole grains, fruits, vegetable, fish, lean meats, nuts, and olive oil. Limit salt. -recommend moderate walking, 3-5 times/week for 30-50 minutes each session. Aim for at least 150 minutes.week. Goal should be pace of 3 miles/hours, or walking 1.5 miles in 30 minutes -recommend avoidance of tobacco products. Avoid excess alcohol.  Plan for follow up: 3 months or sooner as needed  Buford Dresser, MD, PhD, [facility]    Medication Adjustments/Labs and Tests Ordered: Current medicines are reviewed at length with the patient today.  Concerns regarding medicines are outlined above.   No orders of the defined types were placed in this encounter.  No orders of the defined types were placed in this encounter.  There are no Patient Instructions on file for this visit.   I,Mathew Stumpf,acting as a Education administrator for  [employer], MD.,have documented all relevant documentation on the behalf of Buford Dresser, MD,as directed by  Buford Dresser, MD while in the presence of Buford Dresser, MD.  ***  Signed, Buford Dresser, MD PhD 12/13/2021     [place]

## 2022-01-18 ENCOUNTER — Encounter (HOSPITAL_BASED_OUTPATIENT_CLINIC_OR_DEPARTMENT_OTHER): Payer: Self-pay | Admitting: Cardiology

## 2022-01-23 ENCOUNTER — Encounter (HOSPITAL_BASED_OUTPATIENT_CLINIC_OR_DEPARTMENT_OTHER): Payer: Self-pay | Admitting: Cardiology

## 2022-01-24 ENCOUNTER — Other Ambulatory Visit: Payer: Self-pay

## 2022-01-24 ENCOUNTER — Telehealth: Payer: Self-pay | Admitting: Hematology

## 2022-01-24 DIAGNOSIS — D472 Monoclonal gammopathy: Secondary | ICD-10-CM

## 2022-01-24 NOTE — Telephone Encounter (Signed)
Rescheduled upcoming appointment due to provider's PAL. Patient is aware of changes. ?

## 2022-01-25 ENCOUNTER — Other Ambulatory Visit: Payer: 59

## 2022-01-27 NOTE — Progress Notes (Deleted)
? ?NEUROLOGY FOLLOW UP OFFICE NOTE ? ?Dustin Sampson ?[id number] ? ?Assessment/Plan:  ? ?1.  Balance disorder - intermittent - some improvement following stents but not resolved.  Also, given the nystagmus, while PAD contributed to balance problems, not the only etiology.  He does not exhibit smptoms of progressive supranuclear palsy or Parkinson disease.  Imaging of brian reveals no evidence of NPH.  He reports similar symptoms with his father - consider genetic etiology - no cerebellar atrophy to suggest cerebellar ataxia.  Although late age of onset, consider episodic ataxia but did not respond to acetazolamide.  MRI brain does demonstrate small chronic cerebellar infarct but I [place]'t think this would explain the symptoms.  Although he does not exhibit any significant chronic cerebellar/brainstem vascular disease, would still evaluate for vertebrobasilar insufficiency.  He does endorse left leg weakness which should be further evaluated. ?2.  Intermittent diplopia - unclear if related to underlying central vestibulopathy or related to diabetes - Myasthenia gravis not suspected.  Very well may be unrelated to the balance problems. ?  ?To evaluate for cause of left leg weakness, check MRI of cervical/thoracic/lumbar spine ?Further recommendations pending results.  If unrevealing, would like refer to an academic center. ?  ? ?Subjective:  ?Dustin Sampson is a 56 year old male with hypertension, hyperlipidemia, monoclonal paraproteinemia, parotid gland abnormalities, and hypothyroidism who follows up for balance problems. ?  ?UPDATE: ?He was referred to [facility] for second opinion.  He saw Dr. Mickie Hillier on 11/30/2021.  Vitamin E, autoimmune encephalitis panel and genetic autosomal dominant ataxia panel were negative.  No diagnosis was determined but felt that a degenerative process was less likely as patient endorsed stability of symptoms.  If symptoms worsened, plan would be to go to the movement disorder clinic.  *** ?   ?HISTORY:  ?He has had balance problems since around 2018 but has gotten worse.  It is not associated with dizziness.  He feels wobbly when he is walking.  It is intermittent.  He feels that his legs are weak.  If he walks 200 years, he has pain, described as tightness in back of his ankles and calves.  If he rests, he feels okay. He has some low back pain and notes some numbness in the feet.  He takes gabapentin which isn't too helpful.  He may spend a couple of hours standing working in the yard and when he later is standing in the shower, it feels like his legs will buckle.  He feels that he drags his left leg.  Sometimes he has trouble with his right leg.  He also reports that he sometimes has double vision, such as while driving or watching TV and he needs to shut one eye.  He went to the eye doctor and thought that he may have had astigmatism.  He reports droopy eyelids and is concerned about myasthenia gravis.  (His father had similar symptoms.  Doctors thought that he may have had MG, but that was ruled out). However, Myasthenia gravis panel (striated muscle Ab, AchR binding Ab) from April 2021 was negative.  He does have hypothyroidism which he reports is currently not well-treated on Synthroid and is supposed to see an endocrinologist.  He has longstanding history of diabetes.  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  Due to intermittent diplopia and worsening balance problems, he had MRI of brain on 07/30/2020 which was personally reviewed and showed small chronic right cerebellar and left caudate infarcts but no  acute abnormality.  Vascular studies were positive for peripheral arterial disease.  He had stents put in which has helped with the leg pain but not the balance.  I had referred him to ophthalmology but he never made an appointment.  Labs from March 2022 include B1 9, B12 383, TSH 2.61, glucose 115.  CTA of head on 02/03/2021 demonstrated no vertebrobasilar  insufficiency.  He was started on a trial of acetazolamide to see if balance improved but he felt it made symptoms worse.  In June 2022, he was on vacation in [place].  He reports dragging  his left leg more.  He feels fine when he first gets up in the morning but symptoms start about 30 minutes later.  MRI of cervical/thoracic/lumbar spine on 07/04/2021 personally reviewed.  There is mild spinal stenosis at C3-C4, small disc protrusions causing mild cord flattening at T4-5 and T6-7 and mild canal stenosis at T11-12 and left sided disc protrusion/foraminal stenosis at L3-4 possibly touching the left L3 or L4 nerve roots but normal spinal cord and nothing significant that I would expect to be the cause of his symptoms.   ? ?PAST MEDICAL HISTORY: ?Past Medical History:  ?Diagnosis Date  ? Anxiety   ? Arthritis   ? Diabetes mellitus without complication ([place])   ? Gout   ? Hyperlipemia   ? Hypertension   ? Hypothyroidism   ? Insomnia   ? Pneumonia   ? Stroke [facility])   ? Thyroid disease   ? ? ?MEDICATIONS: ?Current Outpatient Medications on File Prior to Visit  ?Medication Sig Dispense Refill  ? acetaminophen (TYLENOL) 500 MG tablet Take 500-1,000 mg by mouth every 6 (six) hours as needed (for pain.).    ? aspirin EC 81 MG tablet Take 1 tablet (81 mg total) by mouth daily. (Patient taking differently: Take 81 mg by mouth every evening.) 150 tablet 2  ? atorvastatin (LIPITOR) 80 MG tablet Take 80 mg by mouth every evening.    ? clopidogrel (PLAVIX) 75 MG tablet Take 1 tablet (75 mg total) by mouth daily. 30 tablet 11  ? Febuxostat 80 MG TABS Take 80 mg by mouth every evening.    ? gabapentin (NEURONTIN) 300 MG capsule Take 300 mg by mouth 3 (three) times daily.    ? levothyroxine (SYNTHROID, LEVOTHROID) 200 MCG tablet Take 200 mcg by mouth every evening.     ? losartan (COZAAR) 100 MG tablet Take 100 mg by mouth every evening.     ? metFORMIN (GLUCOPHAGE-XR) 500 MG 24 hr tablet Take 500 mg by mouth 2 (two) times daily.    ?  metoprolol succinate (TOPROL-XL) 50 MG 24 hr tablet TAKE ONE TABLET BY MOUTH DAILY TAKE WITH OR IMMEDIATELY FOLLOWING A MEAL 90 tablet 3  ? sertraline (ZOLOFT) 25 MG tablet Take 1 tablet (25 mg total) by mouth daily. 30 tablet 5  ? tadalafil (CIALIS) 20 MG tablet Take 20 mg by mouth daily as needed for erectile dysfunction.     ? temazepam (RESTORIL) 30 MG capsule Take 30 mg by mouth at bedtime.    ? tiZANidine (ZANAFLEX) 4 MG tablet Take 1 tablet (4 mg total) by mouth every 8 (eight) hours as needed for muscle spasms. 30 tablet 1  ? ?No current facility-administered medications on file prior to visit.  ? ? ?ALLERGIES: ?Allergies  ?Allergen Reactions  ? Aspirin Cough  ?  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  Avelox [Moxifloxacin Hcl In Nacl] Nausea And Vomiting  ?  "made me real sick"  ? Nsaids   ?  Avoid due to kidney issues   ? Penicillins Other (See Comments)  ?   severe GI upset ?Childhood  ? ? ?FAMILY HISTORY: ?Family History  ?Problem Relation Age of Onset  ? Colon cancer Father   ? Lung cancer Father   ? Atrial fibrillation Mother   ? Autoimmune disease Sister   ? Healthy Daughter   ? Healthy Son   ? ? ?  ?Objective:  ?*** ?General: No acute distress.  Patient appears ***-groomed.   ?Head:  Normocephalic/atraumatic ?Eyes:  Fundi examined but not visualized ?Neck: supple, no paraspinal tenderness, full range of motion ?Heart:  Regular rate and rhythm ?Lungs:  Clear to auscultation bilaterally ?Back: No paraspinal tenderness ?Neurological Exam: alert and oriented to person, place, and time.  Speech fluent and not dysarthric, language intact.  CN II-XII intact. Bulk and tone normal, muscle strength 5/5 throughout.  Sensation to light touch intact.  Deep tendon reflexes 2+ throughout, toes downgoing.  Finger to nose testing intact.  Gait normal, Romberg negative. ? ? ?Metta Clines, DO ? ?CC: *** ? ? ? ? ? ? ?

## 2022-01-31 ENCOUNTER — Encounter: Payer: Self-pay | Admitting: Neurology

## 2022-01-31 ENCOUNTER — Ambulatory Visit: Payer: 59 | Admitting: Neurology

## 2022-01-31 DIAGNOSIS — Z029 Encounter for administrative examinations, unspecified: Secondary | ICD-10-CM

## 2022-02-01 ENCOUNTER — Ambulatory Visit: Payer: 59 | Admitting: Hematology

## 2022-02-08 ENCOUNTER — Inpatient Hospital Stay: Payer: 59 | Attending: Hematology

## 2022-02-08 ENCOUNTER — Other Ambulatory Visit: Payer: Self-pay

## 2022-02-08 DIAGNOSIS — N189 Chronic kidney disease, unspecified: Secondary | ICD-10-CM | POA: Insufficient documentation

## 2022-02-08 DIAGNOSIS — R809 Proteinuria, unspecified: Secondary | ICD-10-CM | POA: Diagnosis not present

## 2022-02-08 DIAGNOSIS — D472 Monoclonal gammopathy: Secondary | ICD-10-CM | POA: Insufficient documentation

## 2022-02-08 DIAGNOSIS — I129 Hypertensive chronic kidney disease with stage 1 through stage 4 chronic kidney disease, or unspecified chronic kidney disease: Secondary | ICD-10-CM | POA: Insufficient documentation

## 2022-02-08 DIAGNOSIS — E1122 Type 2 diabetes mellitus with diabetic chronic kidney disease: Secondary | ICD-10-CM | POA: Diagnosis not present

## 2022-02-08 LAB — CMP (CANCER CENTER ONLY)
ALT: 17 U/L (ref 0–44)
AST: 16 U/L (ref 15–41)
Albumin: 4 g/dL (ref 3.5–5.0)
Alkaline Phosphatase: 79 U/L (ref 38–126)
Anion gap: 6 (ref 5–15)
BUN: 27 mg/dL — ABNORMAL HIGH (ref 6–20)
CO2: 25 mmol/L (ref 22–32)
Calcium: 9.2 mg/dL (ref 8.9–10.3)
Chloride: 105 mmol/L (ref 98–111)
Creatinine: 1.35 mg/dL — ABNORMAL HIGH (ref 0.61–1.24)
GFR, Estimated: >60 mL/min (ref >60)
Glucose, Bld: 115 mg/dL — ABNORMAL HIGH (ref 70–99)
Potassium: 4.6 mmol/L (ref 3.5–5.1)
Sodium: 136 mmol/L (ref 135–145)
Total Bilirubin: 0.3 mg/dL (ref 0.3–1.2)
Total Protein: 7.1 g/dL (ref 6.5–8.1)

## 2022-02-08 LAB — CBC WITH DIFFERENTIAL (CANCER CENTER ONLY)
Abs Immature Granulocytes: 0.02 10*3/uL (ref 0.00–0.07)
Basophils Absolute: 0.1 10*3/uL (ref 0.0–0.1)
Basophils Relative: 1 %
Eosinophils Absolute: 0.3 10*3/uL (ref 0.0–0.5)
Eosinophils Relative: 3 %
HCT: 38.5 % — ABNORMAL LOW (ref 39.0–52.0)
Hemoglobin: 13 g/dL (ref 13.0–17.0)
Immature Granulocytes: 0 %
Lymphocytes Relative: 31 %
Lymphs Abs: 2.8 10*3/uL (ref 0.7–4.0)
MCH: 29.3 pg (ref 26.0–34.0)
MCHC: 33.8 g/dL (ref 30.0–36.0)
MCV: 86.7 fL (ref 80.0–100.0)
Monocytes Absolute: 0.6 10*3/uL (ref 0.1–1.0)
Monocytes Relative: 7 %
Neutro Abs: 5.1 10*3/uL (ref 1.7–7.7)
Neutrophils Relative %: 58 %
Platelet Count: 238 10*3/uL (ref 150–400)
RBC: 4.44 MIL/uL (ref 4.22–5.81)
RDW: 12.9 % (ref 11.5–15.5)
WBC Count: 8.8 10*3/uL (ref 4.0–10.5)
nRBC: 0 % (ref 0.0–0.2)

## 2022-02-08 LAB — LACTATE DEHYDROGENASE: LDH: 130 U/L (ref 98–192)

## 2022-02-14 ENCOUNTER — Other Ambulatory Visit: Payer: Self-pay | Admitting: *Deleted

## 2022-02-14 DIAGNOSIS — I739 Peripheral vascular disease, unspecified: Secondary | ICD-10-CM

## 2022-02-15 ENCOUNTER — Inpatient Hospital Stay (HOSPITAL_BASED_OUTPATIENT_CLINIC_OR_DEPARTMENT_OTHER): Payer: 59 | Admitting: Hematology

## 2022-02-15 ENCOUNTER — Other Ambulatory Visit: Payer: Self-pay

## 2022-02-15 VITALS — BP 123/84 | HR 98 | Temp 97.3°F | Resp 49 | Wt 222.0 lb

## 2022-02-15 DIAGNOSIS — D472 Monoclonal gammopathy: Secondary | ICD-10-CM | POA: Diagnosis not present

## 2022-02-15 NOTE — Progress Notes (Signed)
? ? ?HEMATOLOGY/ONCOLOGY CLINIC NOTE ? ?Date of Service: 02/15/2022 ? ?Patient Care Team: ?[name], PA-C as PCP - General (Family Medicine) ?Buford Dresser, MD as PCP - Cardiology (Cardiology) ?Brunetta Genera, MD as Consulting Physician (Hematology) ? ?REFERRING PHYSICIAN: [name], PA-C ? 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 ?HISTORY OF PRESENTING ILLNESS:  ?Please see previous note for details on initial presentation ? ?INTERVAL HISTORY: ?Dustin Sampson is a 56 y.o. 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 The patient's last visit with [name] was on 02/18/2020. The pt reports that he is doing well overall. ? ?He reports that he is still experiencing issues with balance. He also notes that he been slurring his words. He says that his aunts and some male family members are pursuing genetic testing and the neurologists have not found anything significant at this time. He says that these issues are inconsistent and he notes that he "moves like Frankenstein." We discussed that his symptoms are more uncommon in conjunction with his diagnosis and he is recommended to consider the [employer] Undiagnosed Diseases Program. ? ?He notes that he has been experiencing issues picking up or holding objects and he says that it may be due to his reduced sensitivity in fingers. He further notes that he has been "jerky" and he says that it is inconsistent and feels like a muscular spasm and he further details that this is worse in the morning. ? ?We discussed that his labs indicate that he may be dehydrated and he is recommended to consume at least 38-64 oz of water per day. ? ?Lab results today 02/15/2022 of CBC w/diff is WNL except elevated BUN of 27 mg/dL and elevated creatinine of 1.35 mg/dL. ?CMP is unremarkable. ?LDH is 130. ? ?No new focal bone pains. ?No new or unexpected weight loss. ?No other new or  acute symptoms at this time. ? ? ?MEDICAL HISTORY:  ?Past Medical History:  ?Diagnosis Date  ? Anxiety   ? Arthritis   ? Diabetes mellitus without complication ([place])   ? Gout   ? Hyperlipemia   ? Hypertension   ? Hypothyroidism   ? Insomnia   ? Pneumonia   ? Stroke [facility])   ? Thyroid disease   ? ? ? ?SURGICAL HISTORY: ?Past Surgical History:  ?Procedure Laterality Date  ? ABDOMINAL AORTOGRAM W/LOWER EXTREMITY Bilateral 08/21/2020  ? Procedure: ABDOMINAL AORTOGRAM W/LOWER EXTREMITY;  Surgeon: Elam Dutch, MD;  Location: [place] [facility];  Service: Cardiovascular;  Laterality: Bilateral;  ? ABDOMINAL AORTOGRAM W/LOWER EXTREMITY N/A 09/04/2020  ? Procedure: ABDOMINAL AORTOGRAM W/LOWER EXTREMITY;  Surgeon: Elam Dutch, MD;  Location: [place] [facility];  Service: Cardiovascular;  Laterality: N/A;  ? COLONOSCOPY    ? x2  ? FEMORAL ARTERY EXPLORATION Left 09/04/2020  ? Procedure: FEMORAL ARTERY EXPLORATION;  Surgeon: Angelia Mould, MD;  Location: [place];  Service: Vascular;  Laterality: Left;  ? NASAL SINUS SURGERY    ? PERIPHERAL VASCULAR INTERVENTION Left 08/21/2020  ? Procedure: PERIPHERAL VASCULAR INTERVENTION;  Surgeon: Elam Dutch, MD;  Location: [place] [facility];  Service: Cardiovascular;  Laterality: Left;  sfa stent x 3  ? PERIPHERAL VASCULAR INTERVENTION  09/04/2020  ? Procedure: PERIPHERAL VASCULAR INTERVENTION;  Surgeon: Elam Dutch, MD;  Location: [place] [facility];  Service: Cardiovascular;;  Right SFA  ? SHOULDER ARTHROSCOPY WITH ROTATOR CUFF REPAIR AND SUBACROMIAL DECOMPRESSION Left 02/04/2021  ? Procedure: SHOULDER ARTHROSCOPY WITH  ROTATOR CUFF REPAIR AND SUBACROMIAL DECOMPRESSION;  Surgeon: Tania Ade, MD;  [facility];  Service: Orthopedics;  Laterality: Left;  ? WISDOM TOOTH EXTRACTION    ? WRIST SURGERY    ? bilat  ? ? ? ?SOCIAL HISTORY: ?Social History  ? ?Socioeconomic History  ? Marital status: Married  ?  Spouse name: Not on file  ? Number of children:  2  ? Years of education: BS  ? Highest education level: Bachelor's degree (e.g., BA, AB, BS)  ?Occupational History  ? Occupation: Arts development officer  ?Tobacco Use  ? Smoking status: Every Day  ?  Packs/day: 0.25  ?  Types: Cigarettes  ? Smokeless tobacco: Former  ? Tobacco comments:  ?  1/2 PACK A DAY  ?Vaping Use  ? Vaping Use: Never used  ?Substance and Sexual Activity  ? Alcohol use: Not Currently  ? Drug use: No  ? Sexual activity: Not on file  ?Other Topics Concern  ? Not on file  ?Social History Narrative  ? Married, lives with wife and two teenage children. He and his sister alternate with their parents living with them every other month. His parents are in their upper 57's. He drinks 4-5 cups of coffee a day and 2-3 sodas or glasses of tea. He does not get any regular exercise. Right handed  ? One story home  ? ?Social Determinants of Health  ? ?Financial Resource Strain: Not on file  ?Food Insecurity: Not on file  ?Transportation Needs: Not on file  ?Physical Activity: Not on file  ?Stress: Not on file  ?Social Connections: Not on file  ?Intimate Partner Violence: Not on file  ? ? ? ?FAMILY HISTORY: ?Family History  ?Problem Relation Age of Onset  ? Colon cancer Father   ? Lung cancer Father   ? Atrial fibrillation Mother   ? Autoimmune disease Sister   ? Healthy Daughter   ? Healthy Son   ? ? ? ?ALLERGIES:   ?is allergic to aspirin, avelox [moxifloxacin hcl in nacl], nsaids, and penicillins. ? ? ?MEDICATIONS:  ?Current Outpatient Medications  ?Medication Sig Dispense Refill  ? acetaminophen (TYLENOL) 500 MG tablet Take 500-1,000 mg by mouth every 6 (six) hours as needed (for pain.).    ? aspirin EC 81 MG tablet Take 1 tablet (81 mg total) by mouth daily. (Patient taking differently: Take 81 mg by mouth every evening.) 150 tablet 2  ? atorvastatin (LIPITOR) 80 MG tablet Take 80 mg by mouth every evening.    ? clopidogrel (PLAVIX) 75 MG tablet Take 1 tablet (75 mg total) by mouth daily. 30 tablet 11  ?  Febuxostat 80 MG TABS Take 80 mg by mouth every evening.    ? gabapentin (NEURONTIN) 300 MG capsule Take 300 mg by mouth 3 (three) times daily.    ? levothyroxine (SYNTHROID, LEVOTHROID) 200 MCG tablet Take 200 mcg by mouth every evening.     ? losartan (COZAAR) 100 MG tablet Take 100 mg by mouth every evening.     ? metFORMIN (GLUCOPHAGE-XR) 500 MG 24 hr tablet Take 500 mg by mouth 2 (two) times daily.    ? metoprolol succinate (TOPROL-XL) 50 MG 24 hr tablet TAKE ONE TABLET BY MOUTH DAILY TAKE WITH OR IMMEDIATELY FOLLOWING A MEAL 90 tablet 3  ? sertraline (ZOLOFT) 25 MG tablet Take 1 tablet (25 mg total) by mouth daily. 30 tablet 5  ? tadalafil (CIALIS) 20 MG tablet Take 20 mg by mouth daily as needed for erectile  dysfunction.     ? temazepam (RESTORIL) 30 MG capsule Take 30 mg by mouth at bedtime.    ? tiZANidine (ZANAFLEX) 4 MG tablet Take 1 tablet (4 mg total) by mouth every 8 (eight) hours as needed for muscle spasms. 30 tablet 1  ? ?No current facility-administered medications for this visit.  ? ? ? ?REVIEW OF SYSTEMS:   ?10 Point review of Systems was done is negative except as noted above. ? ?PHYSICAL EXAMINATION: ?Vitals:  ? 02/15/22 1335  ?BP: 123/84  ?Pulse: 98  ?Resp: (!) 49  ?Temp: (!) 97.3 ?F (36.3 ?C)  ?SpO2: 98%  ? ?Wt Readings from Last 3 Encounters:  ?02/15/22 222 lb (100.7 kg)  ?12/13/21 225 lb 12.8 oz (102.4 kg)  ?11/01/21 230 lb 11.2 oz (104.6 kg)  ? ?Body mass index is 30.11 kg/m?Marland Kitchen   ?NAD ?GENERAL:alert, in no acute distress and comfortable ?SKIN: no acute rashes, no significant lesions ?EYES: conjunctiva are pink and non-injected, sclera anicteric ?NECK: supple, no JVD ?LYMPH:  no palpable lymphadenopathy in the cervical, axillary or inguinal regions ?LUNGS: clear to auscultation b/l with normal respiratory effort ?HEART: regular rate & rhythm ?ABDOMEN:  normoactive bowel sounds , non tender, not distended. ?Extremity: no pedal edema ?PSYCH: alert & oriented x 3 with fluent speech ?NEURO: no  focal motor/sensory deficits ? ?Exam performed in chair. ? ?LABORATORY DATA:  ?I have reviewed the data as listed ? ? ?  Latest Ref Rng & Units 02/08/2022  ?  3:54 PM 03/03/2021  ?  2:43 PM 02/01/2021  ?  9:06

## 2022-02-21 ENCOUNTER — Telehealth: Payer: Self-pay | Admitting: Hematology

## 2022-02-21 NOTE — Telephone Encounter (Signed)
Scheduled follow-up appointments per 4/25 los. Patient is aware. 

## 2022-02-22 NOTE — Progress Notes (Signed)
?HISTORY AND PHYSICAL  ? ? ? ?CC:  follow up. ?Requesting Provider:  [name], PA-C ? ?HPI: This is a 56 y.o. male who is here today for follow up for PAD.  Pt has hx of  stenting of left SFA on 08/21/2020 by Dr. Oneida Alar.  He also had right SFA stenting on 09/04/2020.  This was complicated by hematoma in his left groin requiring primary repair by Dr. Scot Dock. ? ?Pt was last seen 02/12/2021 and at that time, he was not having any claudication, rest pain or non healing wounds.  He was still smoking about 5 cigarettes/day but trying to quit.   ? ?The pt returns today for follow up.  He states his legs feel pretty good and improved.  He denies any claudication, rest pain or non healing wounds.  He is still smoking about 10 cigarettes per day but trying to cut back.  His only issue currently is that he is experiencing issues with his balance.   He is compliant with his asa/statin/plavix. ? ?The pt is on a statin for cholesterol management.    ?The pt is on an aspirin.    Other AC:  Plavix ?The pt is on ARB, BB for hypertension.  ?The pt does have diabetes. ?Tobacco hx:  current ? ?Pt does not have family hx of AAA. ? ?Past Medical History:  ?Diagnosis Date  ? Anxiety   ? Arthritis   ? Diabetes mellitus without complication ([place])   ? Gout   ? Hyperlipemia   ? Hypertension   ? Hypothyroidism   ? Insomnia   ? Pneumonia   ? Stroke [facility])   ? Thyroid disease   ? ? ?Past Surgical History:  ?Procedure Laterality Date  ? ABDOMINAL AORTOGRAM W/LOWER EXTREMITY Bilateral 08/21/2020  ? Procedure: ABDOMINAL AORTOGRAM W/LOWER EXTREMITY;  Surgeon: Elam Dutch, MD;  Location: [place] [facility];  Service: Cardiovascular;  Laterality: Bilateral;  ? ABDOMINAL AORTOGRAM W/LOWER EXTREMITY N/A 09/04/2020  ? Procedure: ABDOMINAL AORTOGRAM W/LOWER EXTREMITY;  Surgeon: Elam Dutch, MD;  Location: [place] [facility];  Service: Cardiovascular;  Laterality: N/A;  ? COLONOSCOPY    ? x2  ? FEMORAL ARTERY EXPLORATION Left  09/04/2020  ? Procedure: FEMORAL ARTERY EXPLORATION;  Surgeon: Angelia Mould, MD;  Location: [place];  Service: Vascular;  Laterality: Left;  ? NASAL SINUS SURGERY    ? PERIPHERAL VASCULAR INTERVENTION Left 08/21/2020  ? Procedure: PERIPHERAL VASCULAR INTERVENTION;  Surgeon: Elam Dutch, MD;  Location: [place] [facility];  Service: Cardiovascular;  Laterality: Left;  sfa stent x 3  ? PERIPHERAL VASCULAR INTERVENTION  09/04/2020  ? Procedure: PERIPHERAL VASCULAR INTERVENTION;  Surgeon: Elam Dutch, MD;  Location: [place] [facility];  Service: Cardiovascular;;  Right SFA  ? SHOULDER ARTHROSCOPY WITH ROTATOR CUFF REPAIR AND SUBACROMIAL DECOMPRESSION Left 02/04/2021  ? Procedure: SHOULDER ARTHROSCOPY WITH ROTATOR CUFF REPAIR AND SUBACROMIAL DECOMPRESSION;  Surgeon: Tania Ade, MD;  [facility];  Service: Orthopedics;  Laterality: Left;  ? WISDOM TOOTH EXTRACTION    ? WRIST SURGERY    ? bilat  ? ? ?Allergies  ?Allergen Reactions  ? Aspirin Cough  ?  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 Avelox [Moxifloxacin Hcl In Nacl] Nausea And Vomiting  ?  "made me real sick"  ? Nsaids   ?  Avoid due to kidney issues   ? Penicillins Other (See Comments)  ?   severe  GI upset ?Childhood  ? ? ?Current Outpatient Medications  ?Medication Sig Dispense Refill  ? acetaminophen (TYLENOL) 500 MG tablet Take 500-1,000 mg by mouth every 6 (six) hours as needed (for pain.).    ? aspirin EC 81 MG tablet Take 1 tablet (81 mg total) by mouth daily. (Patient taking differently: Take 81 mg by mouth every evening.) 150 tablet 2  ? atorvastatin (LIPITOR) 80 MG tablet Take 80 mg by mouth every evening.    ? clopidogrel (PLAVIX) 75 MG tablet Take 1 tablet (75 mg total) by mouth daily. 30 tablet 11  ? Febuxostat 80 MG TABS Take 80 mg by mouth every evening.    ? gabapentin (NEURONTIN) 300 MG capsule Take 300 mg by mouth 3 (three) times daily.    ? levothyroxine (SYNTHROID, LEVOTHROID)  200 MCG tablet Take 200 mcg by mouth every evening.     ? losartan (COZAAR) 100 MG tablet Take 100 mg by mouth every evening.     ? metFORMIN (GLUCOPHAGE-XR) 500 MG 24 hr tablet Take 500 mg by mouth 2 (two) times daily.    ? metoprolol succinate (TOPROL-XL) 50 MG 24 hr tablet TAKE ONE TABLET BY MOUTH DAILY TAKE WITH OR IMMEDIATELY FOLLOWING A MEAL 90 tablet 3  ? sertraline (ZOLOFT) 25 MG tablet Take 1 tablet (25 mg total) by mouth daily. 30 tablet 5  ? tadalafil (CIALIS) 20 MG tablet Take 20 mg by mouth daily as needed for erectile dysfunction.     ? temazepam (RESTORIL) 30 MG capsule Take 30 mg by mouth at bedtime.    ? tiZANidine (ZANAFLEX) 4 MG tablet Take 1 tablet (4 mg total) by mouth every 8 (eight) hours as needed for muscle spasms. 30 tablet 1  ? ?No current facility-administered medications for this visit.  ? ? ?Family History  ?Problem Relation Age of Onset  ? Colon cancer Father   ? Lung cancer Father   ? Atrial fibrillation Mother   ? Autoimmune disease Sister   ? Healthy Daughter   ? Healthy Son   ? ? ?Social History  ? ?Socioeconomic History  ? Marital status: Married  ?  Spouse name: Not on file  ? Number of children: 2  ? Years of education: BS  ? Highest education level: Bachelor's degree (e.g., BA, AB, BS)  ?Occupational History  ? Occupation: Arts development officer  ?Tobacco Use  ? Smoking status: Every Day  ?  Packs/day: 0.25  ?  Types: Cigarettes  ? Smokeless tobacco: Former  ? Tobacco comments:  ?  1/2 PACK A DAY  ?Vaping Use  ? Vaping Use: Never used  ?Substance and Sexual Activity  ? Alcohol use: Not Currently  ? Drug use: No  ? Sexual activity: Not on file  ?Other Topics Concern  ? Not on file  ?Social History Narrative  ? Married, lives with wife and two teenage children. He and his sister alternate with their parents living with them every other month. His parents are in their upper 34's. He drinks 4-5 cups of coffee a day and 2-3 sodas or glasses of tea. He does not get any regular exercise.  Right handed  ? One story home  ? ?Social Determinants of Health  ? ?Financial Resource Strain: Not on file  ?Food Insecurity: Not on file  ?Transportation Needs: Not on file  ?Physical Activity: Not on file  ?Stress: Not on file  ?Social Connections: Not on file  ?Intimate Partner Violence: Not on file  ? ? ? ?  REVIEW OF SYSTEMS:  ? ?'[X]'$  denotes positive finding, '[ ]'$  denotes negative finding ?Cardiac  Comments:  ?Chest pain or chest pressure:    ?Shortness of breath upon exertion:    ?Short of breath when lying flat:    ?Irregular heart rhythm:    ?    ?Vascular    ?Pain in calf, thigh, or hip brought on by ambulation:    ?Pain in feet at night that wakes you up from your sleep:     ?Blood clot in your veins:    ?Leg swelling:     ?    ?Pulmonary    ?Oxygen at home:    ?Productive cough:     ?Wheezing:     ?    ?Neurologic    ?Sudden weakness in arms or legs:     ?Sudden numbness in arms or legs:     ?Sudden onset of difficulty speaking or slurred speech:    ?Temporary loss of vision in one eye:     ?Problems with dizziness:     ?    ?Gastrointestinal    ?Blood in stool:     ?Vomited blood:     ?    ?Genitourinary    ?Burning when urinating:     ?Blood in urine:    ?    ?Psychiatric    ?Major depression:     ?    ?Hematologic    ?Bleeding problems:    ?Problems with blood clotting too easily:    ?    ?Skin    ?Rashes or ulcers:    ?    ?Constitutional    ?Fever or chills:    ? ? ?PHYSICAL EXAMINATION: ? ?Today's Vitals  ? 02/25/22 1406  ?BP: 139/82  ?Pulse: 75  ?Resp: 20  ?Temp: 98.2 ?F (36.8 ?C)  ?TempSrc: Temporal  ?SpO2: 95%  ?Weight: 219 lb 11.2 oz (99.7 kg)  ?Height: 6' (1.829 m)  ? ?Body mass index is 29.8 kg/m?. ? ? ?General:  WDWN in NAD; vital signs documented above ?Gait: Not observed ?HENT: WNL, normocephalic ?Pulmonary: normal non-labored breathing , without wheezing ?Cardiac: regular HR, without carotid bruits ?Abdomen: soft, NT, no masses; aortic pulse is not palpable ?Skin: without rashes ?Vascular  Exam/Pulses: ? Right Left  ?Radial 2+ (normal) 2+ (normal)  ?DP 2+ (normal) 2+ (normal)  ?PT 2+ (normal) 2+ (normal)  ? ?Extremities: without ischemic changes, without Gangrene , without cellulitis; without open w

## 2022-02-25 ENCOUNTER — Encounter: Payer: Self-pay | Admitting: Physician Assistant

## 2022-02-25 ENCOUNTER — Ambulatory Visit (INDEPENDENT_AMBULATORY_CARE_PROVIDER_SITE_OTHER): Payer: 59 | Admitting: Physician Assistant

## 2022-02-25 ENCOUNTER — Ambulatory Visit (INDEPENDENT_AMBULATORY_CARE_PROVIDER_SITE_OTHER)
Admission: RE | Admit: 2022-02-25 | Discharge: 2022-02-25 | Disposition: A | Discharge status: Home / Self Care | Payer: 59 | Source: Ambulatory Visit | Attending: Vascular Surgery | Admitting: Vascular Surgery

## 2022-02-25 ENCOUNTER — Ambulatory Visit (HOSPITAL_COMMUNITY)
Admission: RE | Admit: 2022-02-25 | Discharge: 2022-02-25 | Disposition: A | Discharge status: Home / Self Care | Payer: 59 | Source: Ambulatory Visit | Attending: Vascular Surgery | Admitting: Vascular Surgery

## 2022-02-25 VITALS — BP 139/82 | HR 75 | Temp 98.2°F | Resp 20 | Ht 72.0 in | Wt 219.7 lb

## 2022-02-25 DIAGNOSIS — F17200 Nicotine dependence, unspecified, uncomplicated: Secondary | ICD-10-CM

## 2022-02-25 DIAGNOSIS — I739 Peripheral vascular disease, unspecified: Secondary | ICD-10-CM | POA: Diagnosis present

## 2022-03-08 ENCOUNTER — Ambulatory Visit (HOSPITAL_BASED_OUTPATIENT_CLINIC_OR_DEPARTMENT_OTHER): Payer: 59 | Admitting: Cardiology

## 2022-03-08 ENCOUNTER — Encounter (HOSPITAL_BASED_OUTPATIENT_CLINIC_OR_DEPARTMENT_OTHER): Payer: Self-pay

## 2022-06-05 ENCOUNTER — Encounter (HOSPITAL_COMMUNITY): Payer: Self-pay

## 2022-06-05 ENCOUNTER — Other Ambulatory Visit: Payer: Self-pay

## 2022-06-05 ENCOUNTER — Emergency Department (HOSPITAL_COMMUNITY): Payer: 59

## 2022-06-05 ENCOUNTER — Emergency Department (HOSPITAL_COMMUNITY)
Admission: EM | Admit: 2022-06-05 | Discharge: 2022-06-05 | Disposition: A | Discharge status: Home / Self Care | Payer: 59 | Attending: Emergency Medicine | Admitting: Emergency Medicine

## 2022-06-05 DIAGNOSIS — Z7902 Long term (current) use of antithrombotics/antiplatelets: Secondary | ICD-10-CM | POA: Insufficient documentation

## 2022-06-05 DIAGNOSIS — Z91040 Latex allergy status: Secondary | ICD-10-CM | POA: Diagnosis not present

## 2022-06-05 DIAGNOSIS — R221 Localized swelling, mass and lump, neck: Secondary | ICD-10-CM

## 2022-06-05 DIAGNOSIS — Z7982 Long term (current) use of aspirin: Secondary | ICD-10-CM | POA: Insufficient documentation

## 2022-06-05 LAB — COMPREHENSIVE METABOLIC PANEL
ALT: 32 U/L (ref 0–44)
AST: 25 U/L (ref 15–41)
Albumin: 3.9 g/dL (ref 3.5–5.0)
Alkaline Phosphatase: 81 U/L (ref 38–126)
Anion gap: 10 (ref 5–15)
BUN: 17 mg/dL (ref 6–20)
CO2: 23 mmol/L (ref 22–32)
Calcium: 9.1 mg/dL (ref 8.9–10.3)
Chloride: 103 mmol/L (ref 98–111)
Creatinine, Ser: 1.17 mg/dL (ref 0.61–1.24)
GFR, Estimated: >60 mL/min (ref >60)
Glucose, Bld: 217 mg/dL — ABNORMAL HIGH (ref 70–99)
Potassium: 4.4 mmol/L (ref 3.5–5.1)
Sodium: 136 mmol/L (ref 135–145)
Total Bilirubin: 0.6 mg/dL (ref 0.3–1.2)
Total Protein: 7.3 g/dL (ref 6.5–8.1)

## 2022-06-05 LAB — CBC WITH DIFFERENTIAL/PLATELET
Abs Immature Granulocytes: 0.08 10*3/uL — ABNORMAL HIGH (ref 0.00–0.07)
Basophils Absolute: 0.1 10*3/uL (ref 0.0–0.1)
Basophils Relative: 1 %
Eosinophils Absolute: 0.2 10*3/uL (ref 0.0–0.5)
Eosinophils Relative: 1 %
HCT: 41.7 % (ref 39.0–52.0)
Hemoglobin: 14.3 g/dL (ref 13.0–17.0)
Immature Granulocytes: 1 %
Lymphocytes Relative: 16 %
Lymphs Abs: 2.3 10*3/uL (ref 0.7–4.0)
MCH: 29.5 pg (ref 26.0–34.0)
MCHC: 34.3 g/dL (ref 30.0–36.0)
MCV: 86.2 fL (ref 80.0–100.0)
Monocytes Absolute: 0.7 10*3/uL (ref 0.1–1.0)
Monocytes Relative: 5 %
Neutro Abs: 11.3 10*3/uL — ABNORMAL HIGH (ref 1.7–7.7)
Neutrophils Relative %: 76 %
Platelets: 251 10*3/uL (ref 150–400)
RBC: 4.84 MIL/uL (ref 4.22–5.81)
RDW: 12.6 % (ref 11.5–15.5)
WBC: 14.6 10*3/uL — ABNORMAL HIGH (ref 4.0–10.5)
nRBC: 0 % (ref 0.0–0.2)

## 2022-06-05 MED ORDER — IOHEXOL 300 MG/ML  SOLN
75.0000 mL | Freq: Once | INTRAMUSCULAR | Status: AC | PRN
  Administered 2022-06-05: 75 mL via INTRAVENOUS

## 2022-06-05 MED ORDER — SODIUM CHLORIDE (PF) 0.9 % IJ SOLN
INTRAMUSCULAR | Status: AC
  Filled 2022-06-05: qty 50

## 2022-06-05 NOTE — Discharge Instructions (Signed)
Please call the oncology team tomorrow to set up an appointment with soon as possible. As discussed, you have a neck mass that will need tissue biopsy.  Oncology team will like to see you, order the test so that we know what condition is the reason behind the mass.  Please return to the emergency room if you cannot keep anything down or you start having difficulty in breathing.

## 2022-06-05 NOTE — ED Provider Notes (Signed)
[facility] DEPT Provider Note   CSN: [id number] Arrival date & time: 06/05/22  1755     History {Add pertinent medical, surgical, social history, OB history to HPI:1} Chief Complaint  Patient presents with   Throat Swelling     Deanna Boehlke is a 56 y.o. male.  HPI    56 year old male comes in with chief complaint of throat swelling. Patient states that over the last 2 weeks he has noticed some discomfort over his throat and mild swelling.  He has had trouble swallowing, particularly over the last 3 or 4 days.  Today went to urgent care  Home Medications Prior to Admission medications   Medication Sig Start Date End Date Taking? Authorizing Provider  acetaminophen (TYLENOL) 500 MG tablet Take 500-1,000 mg by mouth every 6 (six) hours as needed (for pain.).   Yes [provider]  aspirin EC 81 MG tablet Take 1 tablet (81 mg total) by mouth daily. Patient taking differently: Take 81 mg by mouth 3 (three) times a week. 07/30/20  Yes Fields, Jessy Oto, MD  atorvastatin (LIPITOR) 80 MG tablet Take 80 mg by mouth every evening.   Yes [provider]  clopidogrel (PLAVIX) 75 MG tablet Take 1 tablet (75 mg total) by mouth daily. 08/31/21  Yes Dagoberto Ligas, PA-C  escitalopram (LEXAPRO) 5 MG tablet Take 5 mg by mouth daily. 05/23/22  Yes [provider]  Febuxostat 80 MG TABS Take 80 mg by mouth every evening.   Yes [provider]  gabapentin (NEURONTIN) 300 MG capsule Take 300 mg by mouth 3 (three) times daily. 02/26/19  Yes [provider]  levothyroxine (SYNTHROID, LEVOTHROID) 200 MCG tablet Take 200 mcg by mouth at bedtime.   Yes [provider]  losartan (COZAAR) 100 MG tablet Take 100 mg by mouth every evening.  07/20/20  Yes [provider]  metFORMIN (GLUCOPHAGE-XR) 500 MG 24 hr tablet Take 500 mg by mouth 2 (two) times daily.   Yes [provider]  metoprolol succinate (TOPROL-XL) 50  MG 24 hr tablet TAKE ONE TABLET BY MOUTH DAILY TAKE WITH OR IMMEDIATELY FOLLOWING A MEAL Patient taking differently: Take 50 mg by mouth at bedtime. 08/27/21  Yes Buford Dresser, MD  temazepam (RESTORIL) 30 MG capsule Take 30 mg by mouth at bedtime.   Yes [provider]  sertraline (ZOLOFT) 25 MG tablet Take 1 tablet (25 mg total) by mouth daily. Patient not taking: Reported on 06/05/2022 06/16/21   Pieter Partridge, DO  tadalafil (CIALIS) 20 MG tablet Take 20 mg by mouth daily as needed for erectile dysfunction.     [provider]  tiZANidine (ZANAFLEX) 4 MG tablet Take 1 tablet (4 mg total) by mouth every 8 (eight) hours as needed for muscle spasms. Patient not taking: Reported on 06/05/2022 02/04/21   Grier Mitts, PA-C      Allergies    Aspirin, Avelox [moxifloxacin hcl in nacl], Colchicine, Latex, Nsaids, and Penicillins    Review of Systems   Review of Systems  Physical Exam Updated Vital Signs BP (!) 127/99   Pulse 87   Temp 98.3 F (36.8 C) (Oral)   Resp (!) 21   Ht 6' (1.829 m)   Wt 99.8 kg   SpO2 93%   BMI 29.84 kg/m  Physical Exam  ED Results / Procedures / Treatments   Labs (all labs ordered are listed, but only abnormal results are displayed) Labs Reviewed  CBC WITH DIFFERENTIAL/PLATELET - Abnormal;  Notable for the following components:      Result Value   WBC 14.6 (*)    Neutro Abs 11.3 (*)    Abs Immature Granulocytes 0.08 (*)    All other components within normal limits  COMPREHENSIVE METABOLIC PANEL - Abnormal; Notable for the following components:   Glucose, Bld 217 (*)    All other components within normal limits    EKG None  Radiology CT Soft Tissue Neck W Contrast  Result Date: 06/05/2022 CLINICAL DATA:  Soft tissue swelling infection suspected. Mass in throat area, swelling. EXAM: CT NECK WITH CONTRAST TECHNIQUE: Multidetector CT imaging of the neck was performed using the standard protocol following the bolus  administration of intravenous contrast. RADIATION DOSE REDUCTION: This exam was performed according to the departmental dose-optimization program which includes automated exposure control, adjustment of the mA and/or kV according to patient size and/or use of iterative reconstruction technique. CONTRAST:  1m OMNIPAQUE IOHEXOL 300 MG/ML  SOLN COMPARISON:  None Available. FINDINGS: Pharynx and larynx: No focal mucosal or submucosal lesions are present. Nasopharynx clear. Soft palate and tongue base are within normal. Vallecula and epiglottis are within normal limits. Aryepiglottic folds and piriform sinuses are clear. Insert cords larynx is displaced to the left. Salivary glands: The submandibular and parotid glands and ducts are within normal limits. Thyroid: A mass lesion over the right thyroid bed is new since the prior studies, measuring 8.3 x 6.4 x 6.0 cm. Homogeneous tissue extends both anterior and posterior to the trachea. Soft tissue extends posteriorly to the esophagus. Trachea and larynx are displaced to the left. The left lobe is not discretely seen. Lymph nodes: Enlarged right level 2 level 1B lymph nodes are present. The largest level 2 node measures 27 x 15 x 19 mm. A right level 3 node adjacent to the enlarged thyroid measures 23 x 12 mm. Level 1B lymph nodes measure up to 16 mm. No significant left-sided adenopathy is present. Vascular: Atherosclerotic calcifications are present at the carotid bifurcations bilaterally. No significant stenosis is present. Limited intracranial: Within normal limits. Visualized orbits: The globes and orbits are within normal limits. Mastoids and visualized paranasal sinuses: The paranasal sinuses and mastoid air cells are clear. Skeleton: Mild degenerative changes are present cervical spine. No focal osseous lesions are present. Upper chest: Lung apices are clear. Thoracic inlet is within limits. IMPRESSION: 1. New mass lesion over the right thyroid bed measures 8.3 x  6.4 x 6.0 cm. This is most concerning for neoplasm 2. Enlarged right level 2 and 3 lymph nodes are concerning for metastatic disease. 3. No significant left-sided adenopathy. 4. Aortic Atherosclerosis (ICD10-I70.0). These results were called by telephone at the time of interpretation on 06/05/2022 at 8:18 pm [facility] who verbally acknowledged these results. Electronically Signed   By: CSan MorelleM.D.   On: 06/05/2022 20:18   CT Chest W Contrast  Result Date: 06/05/2022 CLINICAL DATA:  Mass in throat area with cough and difficulty swallowing. EXAM: CT CHEST WITH CONTRAST TECHNIQUE: Multidetector CT imaging of the chest was performed during intravenous contrast administration. RADIATION DOSE REDUCTION: This exam was performed according to the departmental dose-optimization program which includes automated exposure control, adjustment of the mA and/or kV according to patient size and/or use of iterative reconstruction technique. CONTRAST:  795mOMNIPAQUE IOHEXOL 300 MG/ML  SOLN COMPARISON:  April 19, 2021 FINDINGS: Cardiovascular: No significant vascular findings. Normal heart size. No pericardial effusion. Mediastinum/Nodes: No enlarged mediastinal, hilar, or axillary lymph nodes.  The trachea and esophagus demonstrate no significant findings. The thyroid gland is not clearly identified. A 5.8 cm x 7.6 cm x 4.0 cm (incompletely imaged) homogeneous, nonenhancing soft tissue mass (approximately 70.27 Hounsfield units) is seen adjacent to the lateral aspect of the trachea on the right. This extends to the region just above the level of the thoracic inlet and represents a new finding when compared to the prior study. Mild extension of this area is seen along the anterior and posterior aspects of trachea. There is mass effect on the trachea with subsequent right to left midline shift. Lungs/Pleura: Lungs are clear. No pleural effusion or pneumothorax. Upper Abdomen: There is diffuse fatty  infiltration of the liver parenchyma. Musculoskeletal: No chest wall abnormality. No acute or significant osseous findings. IMPRESSION: 1. Large nonenhancing soft tissue mass adjacent to the lateral aspect of the trachea on the right, as described above. Further evaluation with tissue sampling and contrast-enhanced neck CT is recommended to determine the true size of this lesion and exclude the presence of an underlying neoplasm. 2. Hepatic steatosis. Electronically Signed   By: Virgina Norfolk M.D.   On: 06/05/2022 19:49    Procedures Procedures  {Document cardiac monitor, telemetry assessment procedure when appropriate:1}  Medications Ordered in ED Medications  iohexol (OMNIPAQUE) 300 MG/ML solution 75 mL (75 mLs Intravenous Contrast Given 06/05/22 1926)  sodium chloride (PF) 0.9 % injection (  Given by Other 06/05/22 1919)    ED Course/ Medical Decision Making/ A&P Clinical Course as of 06/05/22 2148  Sun Jun 05, 2022  2131 CT scan independently interpreted. Patient has a concerning mass around the neck.  Airway looks patent.  There is deviation of esophagus.  Case discussed with ENT, Dr. Benjamine Mola.  He recommends interventional radiology to biopsy.  Concerns for malignancy from his perspective.  Discussed case with Dr. Alen Blew, oncology.  Discussed patient's presenting symptoms, CT findings including difficulty in swallowing and need for close follow-up for biopsy. He recommends outpatient follow-up with his service and they will schedule the biopsy.  Patient is comfortable with that plan.  He in fact states that he went to urgent care as he did not feel terrible.  Return precautions discussed with him.  [AN]    Clinical Course User Index [AN] Varney Biles, MD                           Medical Decision Making Amount and/or Complexity of Data Reviewed Labs: ordered. Radiology: ordered.  Risk Prescription drug management.   ***  {Document critical care time when  appropriate:1} {Document review of labs and clinical decision tools ie heart score, Chads2Vasc2 etc:1}  {Document your independent review of radiology images, and any outside records:1} {Document your discussion with family members, caretakers, and with consultants:1} {Document social determinants of health affecting pt's care:1} {Document your decision making why or why not admission, treatments were needed:1} Final Clinical Impression(s) / ED Diagnoses Final diagnoses:  Neck mass    Rx / DC Orders ED Discharge Orders     None

## 2022-06-05 NOTE — ED Triage Notes (Signed)
Pt arrives from PCP with c/o throat swelling and mass. Pt was sent over by MD after imaging showed mass in his throat area. Pt endorses difficulty swallow and coughing. Pt denies SOB.

## 2022-06-06 ENCOUNTER — Telehealth: Payer: Self-pay | Admitting: *Deleted

## 2022-06-06 ENCOUNTER — Other Ambulatory Visit: Payer: Self-pay | Admitting: Otolaryngology

## 2022-06-06 ENCOUNTER — Other Ambulatory Visit (HOSPITAL_COMMUNITY): Payer: Self-pay | Admitting: Otolaryngology

## 2022-06-06 DIAGNOSIS — E079 Disorder of thyroid, unspecified: Secondary | ICD-10-CM

## 2022-06-06 NOTE — Telephone Encounter (Signed)
Returned PC to patient - he called earlier asking when he will have an appointment to see Dr. Alen Blew.  He was seen in ED yesterday for a mass in his neck.  Informed patient a biopsy has been ordered & this will be the first step.  Central Scheduling states radiologist is reviewing the biopsy order at this time & they will be contacting the patient with the date & time for the biopsy.  Informed patient once we get a referral, our new patient coordinator will be contacting him.  Patient verbalizes understanding.

## 2022-06-08 ENCOUNTER — Other Ambulatory Visit: Payer: Self-pay | Admitting: Otolaryngology

## 2022-06-08 ENCOUNTER — Other Ambulatory Visit (HOSPITAL_COMMUNITY): Payer: Self-pay | Admitting: Otolaryngology

## 2022-06-08 DIAGNOSIS — E079 Disorder of thyroid, unspecified: Secondary | ICD-10-CM

## 2022-06-10 ENCOUNTER — Ambulatory Visit (HOSPITAL_COMMUNITY)
Admission: RE | Admit: 2022-06-10 | Discharge: 2022-06-10 | Disposition: A | Discharge status: Home / Self Care | Payer: 59 | Source: Ambulatory Visit | Attending: Otolaryngology | Admitting: Otolaryngology

## 2022-06-10 DIAGNOSIS — E079 Disorder of thyroid, unspecified: Secondary | ICD-10-CM | POA: Insufficient documentation

## 2022-06-14 NOTE — Progress Notes (Unsigned)
Dustin Cleveland, MD  [name]   [name] FNA R thyroid lesion  [name] core R cerv LAN  Met v lymphoma  (Do at hospital)  DDH

## 2022-06-15 ENCOUNTER — Ambulatory Visit (HOSPITAL_COMMUNITY)
Admission: RE | Admit: 2022-06-15 | Discharge: 2022-06-15 | Disposition: A | Discharge status: Home / Self Care | Payer: 59 | Source: Ambulatory Visit | Attending: Otolaryngology | Admitting: Otolaryngology

## 2022-06-15 ENCOUNTER — Encounter (HOSPITAL_COMMUNITY): Payer: Self-pay

## 2022-06-15 DIAGNOSIS — E041 Nontoxic single thyroid nodule: Secondary | ICD-10-CM | POA: Insufficient documentation

## 2022-06-15 DIAGNOSIS — D440 Neoplasm of uncertain behavior of thyroid gland: Secondary | ICD-10-CM | POA: Insufficient documentation

## 2022-06-15 DIAGNOSIS — E079 Disorder of thyroid, unspecified: Secondary | ICD-10-CM

## 2022-06-15 MED ORDER — LIDOCAINE HCL (PF) 2 % IJ SOLN
10.0000 mL | Freq: Once | INTRAMUSCULAR | Status: AC
  Administered 2022-06-15: 10 mL

## 2022-06-15 MED ORDER — LIDOCAINE HCL (PF) 2 % IJ SOLN
INTRAMUSCULAR | Status: AC
  Filled 2022-06-15: qty 20

## 2022-06-15 NOTE — Progress Notes (Signed)
PT tolerated thyroid biopsy procedure well today. Labs and afirma obtained and sent for pathology. PT ambulatory at discharge with no acute distress noted and verbalized understanding of discharge instructions. 

## 2022-06-17 LAB — CYTOLOGY - NON PAP

## 2022-07-04 ENCOUNTER — Inpatient Hospital Stay: Payer: 59 | Admitting: Hematology

## 2022-07-11 NOTE — Progress Notes (Unsigned)
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  His father and paternal aunts with history of ataxia.  Now worse since starting treatment for his cancer.    He does not exhibit He does not exhibit smptoms of progressive supranuclear palsy or Parkinson disease.  Imaging of brian reveals no evidence of NPH.  Although late age of onset, consider episodic ataxia but did not respond to acetazolamide.  MRI brain does demonstrate small chronic cerebellar infarct but I Scotti't think this would explain the symptoms.  MRI of spinal cord and lumbar spine unrevealing.  He saw a general neurologist at [facility] who recommended seeing a movement disorder specialist.      Will refer to the Movement Disorder Clinic at [place], which is close to home and where he is being treated for his cancer.   At this time, I Antwone't think he will be able to return to work.  He is planning on applying for disability Follow up after evaluation at [facility] or sooner if needed.  Total time spent reviewing notes from [place] as well as face-to-face time with patient and his wife:  55 minutes.    Subjective:  Dustin Sampson is a 56 year old male with hypertension, hyperlipidemia, monoclonal paraproteinemia, parotid gland abnormalities, chronic headaches and hypothyroidism who follows up for balance problems.   UPDATE: He was referred to [facility] for further evaluation of ataxia.  Where he saw Dr. Mickie Hillier, a general neurologist.  He had genetic testing for autosomal dominant ataxia,  autoimmune encephalopathy panel and vitamin E which were unremarkable.  Therefore, an underlying neurodegenerative disease was not suspected.  If symptoms worsened again, plan would be referral to a movement disorder specialist.    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  Now undergoing chemotherapy which has worsened his ataxia, weakness and falls.  He falls almost daily.  Using a walker in the house.  Slurs his words more frequently.  Sometimes still gets double vision not associated with the ataxia.        HISTORY:  He has had balance problems since around 2018 but has gotten worse.  It is not associated with dizziness.  He feels wobbly when he is walking.  It is intermittent.  He feels that his legs are weak.  If he walks 200 years, he has pain, described as tightness in back of his ankles and calves.  If he rests, he feels okay. He has some low back pain and notes some numbness in the feet.  He takes gabapentin which isn't too helpful.  He may spend a couple of hours standing working in the yard and when he later is standing in the shower, it feels like his legs will buckle.  He feels that he drags his left leg.  Sometimes he has trouble with his right leg.  He also reports that he sometimes has double vision, such as while driving or watching TV and he needs to shut one eye.  He went to the eye doctor and thought that he may have had astigmatism.  He reports droopy eyelids and is concerned about myasthenia gravis (doctors thought his father may have had MG, but that was ruled out). However, Myasthenia gravis panel (striated muscle Ab, AchR binding Ab) from April 2021 was negative.  He does have hypothyroidism which he reports is currently not well-treated on Synthroid and is supposed to see an endocrinologist.  TSH from August 2022 was 2.61.  He has longstanding history of diabetes.  Glucose 115.  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  Other labs included B1 9, B12 383.  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  Vascular studies were positive for peripheral arterial disease.  He had stents put in which has helped with the leg pain but not the balance.  CTA of head on 02/03/2021 demonstrated no vertebrobasilar insufficiency.  MRI of cervical/thoracic/lumbar spine on 07/04/2021 showed mild spinal stenosis at C3-C4, small disc protrusions causing mild cord flattening at T4-5 and T6-7 and mild canal stenosis at T11-12 and left sided disc protrusion/foraminal stenosis at L3-4 possibly touching the left L3 or L4 nerve roots but normal spinal cord and nothing significant that I would expect to be the cause of his symptoms.  He was started on a trial of acetazolamide to see if balance improved but he felt it made symptoms worse.    His father had a similar condition where he started exhibiting ataxia in his late 20s which progressed.  He never had a diagnosis.  His paternal aunts (his father's sisters)  also  have similar symptoms but not as severe.     He has a history of poor sleep and takes Restoril.  He does not feel fatigued during the day.  His wife says he does not snore much.  He is a cigarette smoker.  He does not drink alcohol.  He does not exercise routinely.  He says he hydrates with water during the day.     PAST MEDICAL HISTORY: Past Medical History:  Diagnosis Date   Anxiety    Arthritis    Diabetes mellitus without complication ([place])    Gout    Hyperlipemia    Hypertension    Hypothyroidism    Insomnia    Pneumonia    Stroke [facility])    Thyroid disease     MEDICATIONS: Current Outpatient Medications on File Prior to Visit  Medication Sig Dispense Refill   acetaminophen (TYLENOL) 500 MG tablet Take 500-1,000 mg by mouth every 6 (six) hours as needed (for pain.).     aspirin EC 81 MG tablet Take 1 tablet (81 mg total) by mouth daily. (Patient taking differently: Take 81 mg by mouth 3 (three) times a week.) 150 tablet 2   atorvastatin (LIPITOR) 80 MG tablet Take 80 mg by mouth every  evening.     clopidogrel (PLAVIX) 75 MG tablet Take 1 tablet (75 mg total) by mouth daily. 30 tablet 11   escitalopram (LEXAPRO) 5 MG tablet Take 5 mg by mouth daily.     Febuxostat 80 MG TABS Take 80 mg by mouth every evening.     gabapentin (NEURONTIN) 300 MG capsule Take 300 mg by mouth 3 (three) times daily.     levothyroxine (SYNTHROID, LEVOTHROID) 200 MCG tablet Take 200 mcg by mouth at bedtime.     losartan (COZAAR) 100 MG tablet Take 100 mg by mouth every evening.      metFORMIN (GLUCOPHAGE-XR) 500 MG 24 hr tablet Take 500 mg by mouth 2 (two) times daily.     metoprolol succinate (TOPROL-XL) 50 MG 24 hr  tablet TAKE ONE TABLET BY MOUTH DAILY TAKE WITH OR IMMEDIATELY FOLLOWING A MEAL (Patient taking differently: Take 50 mg by mouth at bedtime.) 90 tablet 3   sertraline (ZOLOFT) 25 MG tablet Take 1 tablet (25 mg total) by mouth daily. (Patient not taking: Reported on 06/05/2022) 30 tablet 5   tadalafil (CIALIS) 20 MG tablet Take 20 mg by mouth daily as needed for erectile dysfunction.      temazepam (RESTORIL) 30 MG capsule Take 30 mg by mouth at bedtime.     tiZANidine (ZANAFLEX) 4 MG tablet Take 1 tablet (4 mg total) by mouth every 8 (eight) hours as needed for muscle spasms. (Patient not taking: Reported on 06/05/2022) 30 tablet 1   No current facility-administered medications on file prior to visit.    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; "it makes me bleed" and caused abdominal pain -Pt can take in small amounts(??)   Avelox [Moxifloxacin Hcl In Nacl] Nausea And Vomiting and Other (See Comments)    "made me really sick"   Colchicine Diarrhea   Latex Other (See Comments)    Irritates the skin   Nsaids Other (See Comments)    Avoids due to kidney issues    Penicillins Other (See Comments)    Severe GI upset- occurred in childhood    FAMILY HISTORY: Family History  Problem Relation Age of Onset   Colon  cancer Father    Lung cancer Father    Atrial fibrillation Mother    Autoimmune disease Sister    Healthy Daughter    Healthy Son       Objective:  Blood pressure 127/87, pulse 61, height '6\' 1"'$  (1.854 m), weight 215 lb (97.5 kg), SpO2 98 %. General: No acute distress.   Head:  Normocephalic/atraumatic Eyes:  Fundi examined but not visualized Neck: supple, no paraspinal tenderness, full range of motion Heart:  Regular rate and rhythm Neurological Exam: alert and oriented to person, place, and time.  Speech fluent and not dysarthric, language intact.  Saccadic eye movements and slight end-gaze nystagmus bilaterally but otherwise, CN II-XII intact. Bulk and tone normal, muscle strength with trace give-way weakness in left hip flexion, otherwise 5/5 throughout.  Sensation to pinprick and vibration intact.  Deep tendon reflexes 1+ throughout, toes downgoing.  Finger to nose testing intact.  Heel-to-shin with slight ataxia bilaterally.  In wheelchair.  Broad-based stance when standing, unsteady.  Unable to take a step unassisted.     Metta Clines, DO  CC: [name], PA-C

## 2022-07-12 ENCOUNTER — Ambulatory Visit (INDEPENDENT_AMBULATORY_CARE_PROVIDER_SITE_OTHER): Payer: 59 | Admitting: Neurology

## 2022-07-12 ENCOUNTER — Encounter: Payer: Self-pay | Admitting: Neurology

## 2022-07-12 VITALS — BP 127/87 | HR 61 | Ht 73.0 in | Wt 215.0 lb

## 2022-07-12 DIAGNOSIS — R270 Ataxia, unspecified: Secondary | ICD-10-CM

## 2022-07-12 NOTE — Patient Instructions (Signed)
I will refer you to the Movement Disorder Clinic at [facility] Follow up after evaluation (or sooner if needed)

## 2022-08-20 ENCOUNTER — Other Ambulatory Visit (HOSPITAL_BASED_OUTPATIENT_CLINIC_OR_DEPARTMENT_OTHER): Payer: Self-pay | Admitting: Cardiology

## 2022-08-20 DIAGNOSIS — I255 Ischemic cardiomyopathy: Secondary | ICD-10-CM

## 2022-08-22 NOTE — Telephone Encounter (Signed)
Rx(s) sent to pharmacy electronically.  

## 2022-09-17 ENCOUNTER — Other Ambulatory Visit: Payer: Self-pay | Admitting: Physician Assistant

## 2022-11-03 NOTE — Progress Notes (Signed)
Disability forms received from [facility].

## 2022-11-17 NOTE — Progress Notes (Signed)
Patient advised Per Dr.Jaffe he will need FCE Done by PT. And records form [facility] [employer]. In which we think may have just come through on the fax this morning will reach out to patient if we need more from [facility] or patient.

## 2022-11-22 ENCOUNTER — Telehealth: Payer: Self-pay | Admitting: Hematology

## 2022-11-22 NOTE — Telephone Encounter (Signed)
Called patient to r/s due to provider PAL. Patient r/s and notified.

## 2022-12-23 ENCOUNTER — Other Ambulatory Visit: Payer: Self-pay

## 2022-12-23 DIAGNOSIS — D472 Monoclonal gammopathy: Secondary | ICD-10-CM

## 2022-12-26 ENCOUNTER — Inpatient Hospital Stay: Payer: 59 | Attending: Hematology

## 2022-12-26 DIAGNOSIS — C8330 Diffuse large B-cell lymphoma, unspecified site: Secondary | ICD-10-CM | POA: Insufficient documentation

## 2022-12-26 DIAGNOSIS — D472 Monoclonal gammopathy: Secondary | ICD-10-CM | POA: Diagnosis present

## 2022-12-26 DIAGNOSIS — Z800 Family history of malignant neoplasm of digestive organs: Secondary | ICD-10-CM | POA: Diagnosis not present

## 2022-12-26 DIAGNOSIS — Z8673 Personal history of transient ischemic attack (TIA), and cerebral infarction without residual deficits: Secondary | ICD-10-CM | POA: Diagnosis not present

## 2022-12-26 DIAGNOSIS — R270 Ataxia, unspecified: Secondary | ICD-10-CM | POA: Insufficient documentation

## 2022-12-26 DIAGNOSIS — Z801 Family history of malignant neoplasm of trachea, bronchus and lung: Secondary | ICD-10-CM | POA: Insufficient documentation

## 2022-12-26 DIAGNOSIS — F17210 Nicotine dependence, cigarettes, uncomplicated: Secondary | ICD-10-CM | POA: Diagnosis not present

## 2022-12-26 LAB — CBC WITH DIFFERENTIAL (CANCER CENTER ONLY)
Abs Immature Granulocytes: 0.03 10*3/uL (ref 0.00–0.07)
Basophils Absolute: 0.1 10*3/uL (ref 0.0–0.1)
Basophils Relative: 1 %
Eosinophils Absolute: 0.3 10*3/uL (ref 0.0–0.5)
Eosinophils Relative: 3 %
HCT: 35.1 % — ABNORMAL LOW (ref 39.0–52.0)
Hemoglobin: 12.5 g/dL — ABNORMAL LOW (ref 13.0–17.0)
Immature Granulocytes: 0 %
Lymphocytes Relative: 14 %
Lymphs Abs: 1.2 10*3/uL (ref 0.7–4.0)
MCH: 30.8 pg (ref 26.0–34.0)
MCHC: 35.6 g/dL (ref 30.0–36.0)
MCV: 86.5 fL (ref 80.0–100.0)
Monocytes Absolute: 0.8 10*3/uL (ref 0.1–1.0)
Monocytes Relative: 9 %
Neutro Abs: 6.5 10*3/uL (ref 1.7–7.7)
Neutrophils Relative %: 73 %
Platelet Count: 245 10*3/uL (ref 150–400)
RBC: 4.06 MIL/uL — ABNORMAL LOW (ref 4.22–5.81)
RDW: 13.9 % (ref 11.5–15.5)
WBC Count: 8.8 10*3/uL (ref 4.0–10.5)
nRBC: 0 % (ref 0.0–0.2)

## 2022-12-26 LAB — LACTATE DEHYDROGENASE: LDH: 148 U/L (ref 98–192)

## 2022-12-26 LAB — CMP (CANCER CENTER ONLY)
ALT: 15 U/L (ref 0–44)
AST: 14 U/L — ABNORMAL LOW (ref 15–41)
Albumin: 4.2 g/dL (ref 3.5–5.0)
Alkaline Phosphatase: 94 U/L (ref 38–126)
Anion gap: 9 (ref 5–15)
BUN: 13 mg/dL (ref 6–20)
CO2: 24 mmol/L (ref 22–32)
Calcium: 9.1 mg/dL (ref 8.9–10.3)
Chloride: 106 mmol/L (ref 98–111)
Creatinine: 1.12 mg/dL (ref 0.61–1.24)
GFR, Estimated: >60 mL/min (ref >60)
Glucose, Bld: 161 mg/dL — ABNORMAL HIGH (ref 70–99)
Potassium: 4.6 mmol/L (ref 3.5–5.1)
Sodium: 139 mmol/L (ref 135–145)
Total Bilirubin: 0.5 mg/dL (ref 0.3–1.2)
Total Protein: 7.2 g/dL (ref 6.5–8.1)

## 2023-01-02 ENCOUNTER — Inpatient Hospital Stay (HOSPITAL_BASED_OUTPATIENT_CLINIC_OR_DEPARTMENT_OTHER): Payer: 59 | Admitting: Hematology

## 2023-01-02 VITALS — BP 127/82 | HR 68 | Temp 97.7°F | Resp 18 | Wt 215.4 lb

## 2023-01-02 DIAGNOSIS — D472 Monoclonal gammopathy: Secondary | ICD-10-CM

## 2023-01-02 NOTE — Progress Notes (Signed)
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   Patient was last seen by me on 02/15/2022 and he complained mild balance problems, reduce sensitivity in fingers, and muscle spasm.  Patient has been visiting Dr. Jolayne Haines since our last visit and he has been getting treated with chemotherapy for large B-cell lymphoma. He was diagnosed with stage II large B-cell lymphoma. He has completed 6 cycles of his chemotherapy.   Patient reports his Ataxia is the same since our last visit. He notes that the chemotherapy worsened his ataxia. He has been having balance problems and he has been using walking cane and a walker.   He denies fever, chills, night sweats, unexpected weight loss, abdominal pain, chest pain, back pain, or leg swelling.    MEDICAL HISTORY:  Past Medical History:  Diagnosis Date   Anxiety    Arthritis    Diabetes mellitus without complication ([place])    Gout    Hyperlipemia    Hypertension    Hypothyroidism    Insomnia    Pneumonia    Stroke [facility])    Thyroid disease      SURGICAL HISTORY: Past Surgical History:  Procedure Laterality Date   ABDOMINAL AORTOGRAM W/LOWER EXTREMITY Bilateral 08/21/2020   Procedure: ABDOMINAL AORTOGRAM W/LOWER EXTREMITY;  Surgeon: Elam Dutch, MD;  Location: [place] [facility];  Service:  Cardiovascular;  Laterality: Bilateral;   ABDOMINAL AORTOGRAM W/LOWER EXTREMITY N/A 09/04/2020   Procedure: ABDOMINAL AORTOGRAM W/LOWER EXTREMITY;  Surgeon: Elam Dutch, MD;  Location: [place] [facility];  Service: Cardiovascular;  Laterality: N/A;   COLONOSCOPY     x2   FEMORAL ARTERY EXPLORATION Left 09/04/2020   Procedure: FEMORAL ARTERY EXPLORATION;  Surgeon: Angelia Mould, MD;  [facility];  Service: Vascular;  Laterality: Left;   NASAL SINUS SURGERY     PERIPHERAL VASCULAR INTERVENTION Left 08/21/2020   Procedure: PERIPHERAL VASCULAR INTERVENTION;  Surgeon: Elam Dutch, MD;  Location: [place] [facility];  Service: Cardiovascular;  Laterality: Left;  sfa stent x 3   PERIPHERAL VASCULAR INTERVENTION  09/04/2020   Procedure: PERIPHERAL VASCULAR INTERVENTION;  Surgeon: Elam Dutch, MD;  Location: [place] [facility];  Service: Cardiovascular;;  Right SFA   SHOULDER ARTHROSCOPY WITH ROTATOR CUFF REPAIR AND SUBACROMIAL DECOMPRESSION Left 02/04/2021   Procedure: SHOULDER ARTHROSCOPY WITH ROTATOR CUFF REPAIR AND SUBACROMIAL DECOMPRESSION;  Surgeon: Tania Ade, MD;  Location: [facility];  Service: Orthopedics;  Laterality: Left;   WISDOM TOOTH EXTRACTION     WRIST SURGERY     bilat     SOCIAL HISTORY: Social History   Socioeconomic History   Marital status: Married    Spouse name: Not on file   Number of children: 2   Years of education: BS   Highest education level: Bachelor's degree (e.g., BA, AB, BS)  Occupational History   Occupation: Arts development officer  Tobacco Use   Smoking status: Every Day    Packs/day: 0.25  Types: Cigarettes    Passive exposure: Never   Smokeless tobacco: Former   Tobacco comments:    1/2 PACK A DAY  Vaping Use   Vaping Use: Never used  Substance and Sexual Activity   Alcohol use: Not Currently   Drug use: No   Sexual activity: Not on file  Other Topics Concern   Not on file  Social History Narrative   Married,  lives with wife and two teenage children. He and his sister alternate with their parents living with them every other month. His parents are in their upper 60's. He drinks 4-5 cups of coffee a day and 2-3 sodas or glasses of tea. He does not get any regular exercise. Right handed   One story home   Social Determinants of Health   Financial Resource Strain: Not on file  Food Insecurity: Not on file  Transportation Needs: Not on file  Physical Activity: Not on file  Stress: Not on file  Social Connections: Not on file  Intimate Partner Violence: Not on file     FAMILY HISTORY: Family History  Problem Relation Age of Onset   Colon cancer Father    Lung cancer Father    Atrial fibrillation Mother    Autoimmune disease Sister    Healthy Daughter    Healthy Son      ALLERGIES:   is allergic to aspirin, avelox [moxifloxacin hcl in nacl], colchicine, latex, nsaids, and penicillins.   MEDICATIONS:  Current Outpatient Medications  Medication Sig Dispense Refill   acetaminophen (TYLENOL) 500 MG tablet Take 500-1,000 mg by mouth every 6 (six) hours as needed (for pain.).     aspirin EC 81 MG tablet Take 1 tablet (81 mg total) by mouth daily. (Patient taking differently: Take 81 mg by mouth 3 (three) times a week.) 150 tablet 2   atorvastatin (LIPITOR) 80 MG tablet Take 80 mg by mouth every evening.     clopidogrel (PLAVIX) 75 MG tablet TAKE ONE TABLET BY MOUTH DAILY 30 tablet 11   escitalopram (LEXAPRO) 5 MG tablet Take 5 mg by mouth daily.     Febuxostat 80 MG TABS Take 80 mg by mouth every evening. (Patient not taking: Reported on 07/12/2022)     gabapentin (NEURONTIN) 300 MG capsule Take 300 mg by mouth 3 (three) times daily.     levothyroxine (SYNTHROID, LEVOTHROID) 200 MCG tablet Take 200 mcg by mouth at bedtime.     losartan (COZAAR) 100 MG tablet Take 100 mg by mouth every evening.      metFORMIN (GLUCOPHAGE-XR) 500 MG 24 hr tablet Take 500 mg by mouth 2 (two) times daily.      metoprolol succinate (TOPROL-XL) 50 MG 24 hr tablet TAKE ONE TABLET BY MOUTH DAILY WITH OR IMMEDIATELY FOLLOWING A MEAL 90 tablet 1   sertraline (ZOLOFT) 25 MG tablet Take 1 tablet (25 mg total) by mouth daily. (Patient not taking: Reported on 06/05/2022) 30 tablet 5   tadalafil (CIALIS) 20 MG tablet Take 20 mg by mouth daily as needed for erectile dysfunction.      temazepam (RESTORIL) 30 MG capsule Take 30 mg by mouth at bedtime.     tiZANidine (ZANAFLEX) 4 MG tablet Take 1 tablet (4 mg total) by mouth every 8 (eight) hours as needed for muscle spasms. (Patient not taking: Reported on 06/05/2022) 30 tablet 1   No current facility-administered medications for this visit.     REVIEW OF SYSTEMS:   10 Point review of Systems was  done is negative except as noted above.  PHYSICAL EXAMINATION: There were no vitals filed for this visit.  Wt Readings from Last 3 Encounters:  01/02/23 215 lb 6 oz (97.7 kg)  07/12/22 215 lb (97.5 kg)  06/05/22 220 lb (99.8 kg)   Body mass index is 28.42 kg/m.   NAD GENERAL:alert, in no acute distress and comfortable SKIN: no acute rashes, no significant lesions EYES: conjunctiva are pink and non-injected, sclera anicteric NECK: supple, no JVD LYMPH:  no palpable lymphadenopathy in the cervical, axillary or inguinal regions LUNGS: clear to auscultation b/l with normal respiratory effort HEART: regular rate & rhythm ABDOMEN:  normoactive bowel sounds , non tender, not distended. Extremity: no pedal edema PSYCH: alert & oriented x 3 with fluent speech NEURO: no focal motor/sensory deficits  Exam performed in chair.  LABORATORY DATA:  I have reviewed the data as listed     Latest Ref Rng & Units 12/26/2022    1:27 PM 06/05/2022    6:20 PM 02/08/2022    3:54 PM  CBC  WBC 4.0 - 10.5 K/uL 8.8  14.6  8.8   Hemoglobin 13.0 - 17.0 g/dL 12.5  14.3  13.0   Hematocrit 39.0 - 52.0 % 35.1  41.7  38.5   Platelets 150 - 400 K/uL 245  251  238    .    Latest Ref  Rng & Units 12/26/2022    1:27 PM 06/05/2022    6:20 PM 02/08/2022    3:54 PM  CMP  Glucose 70 - 99 mg/dL 161  217  115   BUN 6 - 20 mg/dL 13  17  27    Creatinine 0.61 - 1.24 mg/dL 1.12  1.17  1.35   Sodium 135 - 145 mmol/L 139  136  136   Potassium 3.5 - 5.1 mmol/L 4.6  4.4  4.6   Chloride 98 - 111 mmol/L 106  103  105   CO2 22 - 32 mmol/L 24  23  25    Calcium 8.9 - 10.3 mg/dL 9.1  9.1  9.2   Total Protein 6.5 - 8.1 g/dL 7.2  7.3  7.1   Total Bilirubin 0.3 - 1.2 mg/dL 0.5  0.6  0.3   Alkaline Phos 38 - 126 U/L 94  81  79   AST 15 - 41 U/L 14  25  16    ALT 0 - 44 U/L 15  32  17    Component     Latest Ref Rng & Units 07/25/2019 07/29/2019  Total Protein, Urine-UPE24     Not Estab. mg/dL  93.8  Total Protein, Urine-Ur/day     30 - 150 mg/24 hr  1,360 (H)  ALBUMIN, U     %  83.7  ALPHA 1 URINE     %  3.6  Alpha 2, Urine     %  2.7  % BETA, Urine     %  7.3  GAMMA GLOBULIN URINE     %  2.6  Free Kappa Lt Chains,Ur     0.63 - 113.79 mg/L  29.98  Free Lambda Lt Chains,Ur     0.47 - 11.77 mg/L  2.30  Free Kappa/Lambda Ratio     1.03 - 31.76  13.03  Immunofixation Result, Urine       Comment  Total Volume       1,450  M-SPIKE %, Urine     Not Observed %  1.0 (H)  M-Spike, mg/24 hr  Not Observed mg/24 hr  14 (H)  NOTE:       Comment  IgG (Immunoglobin G), Serum     603 - 1,613 mg/dL 972   IgA     90 - 386 mg/dL 212   IgM (Immunoglobulin M), Srm     20 - 172 mg/dL 59   Total Protein ELP     6.0 - 8.5 g/dL 7.1   Albumin SerPl Elph-Mcnc     2.9 - 4.4 g/dL 3.7   Alpha 1     0.0 - 0.4 g/dL 0.2   Alpha2 Glob SerPl Elph-Mcnc     0.4 - 1.0 g/dL 1.2 (H)   B-Globulin SerPl Elph-Mcnc     0.7 - 1.3 g/dL 1.0   Gamma Glob SerPl Elph-Mcnc     0.4 - 1.8 g/dL 1.0   M Protein SerPl Elph-Mcnc     Not Observed g/dL 0.5 (H)   Globulin, Total     2.2 - 3.9 g/dL 3.4   Albumin/Glob SerPl     0.7 - 1.7 1.1   IFE 1      Comment   Please Note (HCV):      Comment   Kappa free  light chain     3.3 - 19.4 mg/L 28.6 (H)   Lamda free light chains     5.7 - 26.3 mg/L 16.1   Kappa, lamda light chain ratio     0.26 - 1.65 1.78 (H)   Beta-2 Microglobulin     0.6 - 2.4 mg/L 1.6   LDH     98 - 192 U/L 139   Sed Rate     0 - 16 mm/hr 30 (H)     08/05/2019(Accession:[id number])CT-GUIDED BONE MARROW BIOPSY AND ASPIRATION    08/05/2019 Cytogenetics analysis     06/20/2019 Labs   RADIOGRAPHIC STUDIES: I have personally reviewed the radiological images as listed and agreed with the findings in the report. No results found.    ASSESSMENT & PLAN:  Dustin Sampson is a 57 y.o. 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 08/05/2019 blood test showed his monoclonal  IgG kappa M protein spike hasn't changed since 05/2019.  Proteinuria ? 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 Mild arteriosclerosis. No evidence of monoclonal immunogloubulin deposition disease. No evidence of an immune complex mediate or active glomerulonephritis/renal disease."  2> Recently diagnosed and treated DLNCL -- mx by Dr Jolayne Haines. PLAN: -Discussed lab results from 12/26/2022 with the patient. CBC shows slightly decreased hemoglobin of 12.5 and slightly decreased hematocrit of 35.1. CMP is stable.  SPEP not done with labs -- would like be altered with recent DLBCL treatmenht -Answered all of patient's questions.  -Continue to follow-up with Dr. Jolayne Haines for DLBCL and MGUS  FOLLOW-UP: RTC with Dr Jolayne Haines for continued cares.  The total time spent in the appointment was 20 minutes* .  All of the patient's questions were answered with apparent satisfaction. The patient knows to call the clinic with any problems, questions or concerns.   Sullivan Lone MD MS AAHIVMS [facility]  [facility] Hematology/Oncology Physician [facility]  .*Total Encounter Time as defined by the Centers for Medicare and Medicaid Services includes, in addition to the face-to-face time of a patient visit (documented in the note above) non-face-to-face time: obtaining and reviewing  outside history, ordering and reviewing medications, tests or procedures, care coordination (communications with other health care professionals or caregivers) and documentation in the medical record.   I, Cleda Mccreedy, am acting as a Education administrator for Sullivan Lone, MD. .I have reviewed the above documentation for accuracy and completeness, and I agree with the above. Brunetta Genera MD

## 2023-01-09 ENCOUNTER — Other Ambulatory Visit: Payer: 59

## 2023-01-16 ENCOUNTER — Ambulatory Visit: Payer: 59 | Admitting: Hematology

## 2023-02-02 ENCOUNTER — Telehealth: Payer: Self-pay

## 2023-02-02 NOTE — Telephone Encounter (Signed)
Fax received from [employer], Disability forms.   Form in purple folder in Dr.Jaffe office.

## 2023-02-10 NOTE — Telephone Encounter (Signed)
Advised patient Wake movement notes and FCE done before forms could be filled out.'   Patient will check and see if they will do that at [address] and give [name] a call back.

## 2023-02-12 ENCOUNTER — Other Ambulatory Visit (HOSPITAL_BASED_OUTPATIENT_CLINIC_OR_DEPARTMENT_OTHER): Payer: Self-pay | Admitting: Cardiology

## 2023-02-12 DIAGNOSIS — I255 Ischemic cardiomyopathy: Secondary | ICD-10-CM

## 2023-02-13 NOTE — Telephone Encounter (Signed)
Rx(s) sent to pharmacy electronically.  

## 2023-02-14 NOTE — Telephone Encounter (Signed)
Patient wanted to make sure what he needed to have done.  FCE is needed.

## 2023-02-14 NOTE — Telephone Encounter (Signed)
Pt called in wanting to speak with Sheena about the paperwork.

## 2023-03-09 ENCOUNTER — Other Ambulatory Visit: Payer: Self-pay | Admitting: *Deleted

## 2023-03-09 DIAGNOSIS — I739 Peripheral vascular disease, unspecified: Secondary | ICD-10-CM

## 2023-03-22 ENCOUNTER — Ambulatory Visit (HOSPITAL_COMMUNITY): Payer: Self-pay

## 2023-03-22 ENCOUNTER — Ambulatory Visit: Payer: Self-pay

## 2023-03-22 ENCOUNTER — Ambulatory Visit (HOSPITAL_COMMUNITY): Admission: RE | Admit: 2023-03-22 | Payer: Self-pay | Source: Ambulatory Visit

## 2023-04-04 ENCOUNTER — Other Ambulatory Visit (HOSPITAL_BASED_OUTPATIENT_CLINIC_OR_DEPARTMENT_OTHER): Payer: Self-pay | Admitting: Cardiology

## 2023-04-04 DIAGNOSIS — I255 Ischemic cardiomyopathy: Secondary | ICD-10-CM

## 2023-04-04 NOTE — Telephone Encounter (Signed)
Rx request sent to pharmacy.  

## 2023-04-21 ENCOUNTER — Ambulatory Visit (HOSPITAL_BASED_OUTPATIENT_CLINIC_OR_DEPARTMENT_OTHER): Payer: BC Managed Care – PPO | Admitting: Cardiology

## 2023-04-21 ENCOUNTER — Encounter (HOSPITAL_BASED_OUTPATIENT_CLINIC_OR_DEPARTMENT_OTHER): Payer: Self-pay | Admitting: Cardiology

## 2023-04-21 VITALS — BP 114/82 | HR 93 | Ht 73.0 in | Wt 219.0 lb

## 2023-04-21 DIAGNOSIS — Z7984 Long term (current) use of oral hypoglycemic drugs: Secondary | ICD-10-CM

## 2023-04-21 DIAGNOSIS — I10 Essential (primary) hypertension: Secondary | ICD-10-CM

## 2023-04-21 DIAGNOSIS — I255 Ischemic cardiomyopathy: Secondary | ICD-10-CM | POA: Diagnosis not present

## 2023-04-21 DIAGNOSIS — I739 Peripheral vascular disease, unspecified: Secondary | ICD-10-CM | POA: Diagnosis not present

## 2023-04-21 DIAGNOSIS — E118 Type 2 diabetes mellitus with unspecified complications: Secondary | ICD-10-CM

## 2023-04-21 DIAGNOSIS — I2510 Atherosclerotic heart disease of native coronary artery without angina pectoris: Secondary | ICD-10-CM

## 2023-04-21 NOTE — Progress Notes (Unsigned)
Cardiology Office Note:  .   Date:  04/21/2023  ID:  Dustin Sampson, DOB 1966/01/19, MRN [id number] PCP: Jarrett Soho, PA-C  [facility] Providers Cardiologist:  Jodelle Red, MD {  History of Present Illness: .   Dustin Sampson is a 57 y.o. male with a hx of hypertension, hyperlipidemia, type II diabetes, hypothyroidism, claudication, PAD, currently tobacco use, CVA by MRI, diffuse large B cell lymphoma,  MGUS who is seen for follow up today. I initially met him 08/11/20.   Cardiovascular risk factors: Prior clinical ASCVD: PAD. Prior silent CVA based on MRI Comorbid conditions: Endorses hypertension, hyperlipidemia, diabetes, chronic kidney disease Metabolic syndrome/Obesity: Highest adult weight was 238 lbs, working on weight loss now Tobacco use history: still smoking, working on weaning off. Peak 1 ppd Family history: father had 4-5 stents, no heart attack that he knows of. Mother has afib.   Today: In motorized wheelchair today. Completed treatment for diffuse large B cell lymphoma. Overall feels tired. Concerned as he has continued to struggle with ataxia/balance. His father also struggled with this. Working with physical therapy on this. Uses a walker in the house.  We reviewed his cardiac history/workup to this point.   Has not walked around the block in months. Very limited, primarily sitting or laying down.   Last echo from [facility] 06/25/2022, EF 50-55%.   ROS: Denies chest pain, shortness of breath at rest or with normal exertion. No PND, orthopnea, LE edema or unexpected weight gain. No syncope or palpitations. ROS otherwise negative except as noted.   Studies Reviewed: Marland Kitchen    EKG:     not ordered today  Physical Exam:   VS:  BP 114/82   Pulse 93   Ht 6\' 1"  (1.854 m)   Wt 219 lb (99.3 kg)   SpO2 94%   BMI 28.89 kg/m    Wt Readings from Last 3 Encounters:  04/21/23 219 lb (99.3 kg)  01/02/23 215 lb 6 oz (97.7 kg)  07/12/22 215 lb (97.5 kg)    GEN: Well  nourished, well developed in no acute distress HEENT: Normal, moist mucous membranes NECK: No JVD CARDIAC: regular rhythm, normal S1 and S2, no rubs or gallops. No murmur. VASCULAR: Radial and DP pulses 2+ bilaterally. No carotid bruits RESPIRATORY:  Clear to auscultation without rales, wheezing or rhonchi  ABDOMEN: Soft, non-tender, non-distended MUSCULOSKELETAL:  Ambulates independently SKIN: Warm and dry, no edema NEUROLOGIC:  Alert and oriented x 3. No focal neuro deficits noted. PSYCHIATRIC:  Normal affect    ASSESSMENT AND PLAN: .   Frequent PVCs -minimally bothersome on current dose of metoprolol -no syncope or sustained symptoms -reviewed monitor today   Aortic atherosclerosis Claudication, with moderate PAD in lower extremities by ABI Prior CVA by MRI Hypertension Type II diabetes with hyperlipidemia Ongoing tobacco use -continue aspirin 81 mg, clopidogrel 75 mg, atorvastatin 80 mg -continue losartan for diabetes/hypertension -consider SGLT2i or GLP1RA in the future; he has looked into this and the cost was high.  -counseled on role of smoking and counseled to quit completely -discussed red flag warning signs that need immediate medical attention   Cardiomyopathy -we reviewed his ischemia workup to date.  -nuclear stress was abnormal, and his EF is about 45% -continue metoprolol succinate, losartan -he has a history of prior kidney disease, though recent labs encouraging. Would need to discuss ARNI or MRA with his nephrologist -see prior discussions re: cath, declines at this time. Reasonable especially given prior renal function. Low threshold though  to readdress for worsening symptoms -Last echo from [facility] 06/25/2022, EF 50-55%.   Tobacco abuse counseling: he is working to quit  Total time of encounter: 41 minutes total time of encounter, including 31 minutes spent in face-to-face patient care. This time includes coordination of care and counseling regarding above  conditions. Remainder of non-face-to-face time involved reviewing chart documents/testing relevant to the patient encounter and documentation in the medical record.  Jodelle Red, MD, PhD, [facility]  [facility]    Dispo: 6 months or sooner as needed  Signed, Jodelle Red, MD   Jodelle Red, MD, PhD, [facility]  [facility]  [place]  Heart & Vascular at [facility] [address], [address] [place], [place] [address] [phone]

## 2023-04-21 NOTE — Patient Instructions (Signed)
Medication Instructions:  Your physician recommends that you continue on your current medications as directed. Please refer to the Current Medication list given to you today.  *If you need a refill on your cardiac medications before your next appointment, please call your pharmacy*  Follow-Up: At [facility], you and your health needs are our priority.  As part of our continuing mission to provide you with exceptional heart care, we have created designated Provider Care Teams.  These Care Teams include your primary Cardiologist (physician) and Advanced Practice Providers (APPs -  Physician Assistants and Nurse Practitioners) who all work together to provide you with the care you need, when you need it.  Your next appointment:   6 month(s)  Provider:   Bridgette Christopher, MD     

## 2023-05-01 ENCOUNTER — Other Ambulatory Visit (HOSPITAL_BASED_OUTPATIENT_CLINIC_OR_DEPARTMENT_OTHER): Payer: Self-pay | Admitting: Cardiology

## 2023-05-01 DIAGNOSIS — I255 Ischemic cardiomyopathy: Secondary | ICD-10-CM

## 2023-05-01 NOTE — Telephone Encounter (Signed)
Rx(s) sent to pharmacy electronically.  

## 2023-05-04 ENCOUNTER — Ambulatory Visit (INDEPENDENT_AMBULATORY_CARE_PROVIDER_SITE_OTHER)
Admission: RE | Admit: 2023-05-04 | Discharge: 2023-05-04 | Disposition: A | Discharge status: Home / Self Care | Payer: BC Managed Care – PPO | Source: Ambulatory Visit | Attending: Physician Assistant | Admitting: Physician Assistant

## 2023-05-04 ENCOUNTER — Ambulatory Visit (INDEPENDENT_AMBULATORY_CARE_PROVIDER_SITE_OTHER): Payer: BC Managed Care – PPO | Admitting: Physician Assistant

## 2023-05-04 ENCOUNTER — Ambulatory Visit (HOSPITAL_COMMUNITY)
Admission: RE | Admit: 2023-05-04 | Discharge: 2023-05-04 | Disposition: A | Discharge status: Home / Self Care | Payer: BC Managed Care – PPO | Source: Ambulatory Visit | Attending: Physician Assistant | Admitting: Physician Assistant

## 2023-05-04 VITALS — BP 149/82 | HR 92 | Temp 98.2°F | Resp 20 | Ht 73.0 in | Wt 218.0 lb

## 2023-05-04 DIAGNOSIS — I739 Peripheral vascular disease, unspecified: Secondary | ICD-10-CM

## 2023-05-04 LAB — VAS US ABI WITH/WO TBI
Left ABI: 1.09
Right ABI: 1.11

## 2023-05-04 NOTE — Progress Notes (Signed)
[facility] [place] HISTORY AND PHYSICAL   History of Present Illness:  Patient is a 57 y.o. year old male who presents for evaluation of PAD.   He had reported 5 years of claudication symptoms.  He is a smoker and has cut back.  He is s/p  stenting of left SFA on 08/21/2020 by Dr. Darrick Penna.  He also had right SFA stenting on 09/04/2020.  This was complicated by hematoma in his left groin requiring primary repair by Dr. Edilia Bo.    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  He states his dad had the same thing.  He denies claudication, rest pain or non healing wounds.     The pt is on a statin for cholesterol management.    The pt is on an aspirin.    Other AC:  Plavix The pt is on ARB, BB for hypertension.  The pt does have diabetes. Tobacco hx:  current   Past Medical History:  Diagnosis Date   Anxiety    Arthritis    Diabetes mellitus without complication (HCC)    Gout    Hyperlipemia    Hypertension    Hypothyroidism    Insomnia    Pneumonia    Stroke [facility])    Thyroid disease     Past Surgical History:  Procedure Laterality Date   ABDOMINAL AORTOGRAM W/LOWER EXTREMITY Bilateral 08/21/2020   Procedure: ABDOMINAL AORTOGRAM W/LOWER EXTREMITY;  Surgeon: Sherren Kerns, MD;  Location: [facility];  Service: Cardiovascular;  Laterality: Bilateral;   ABDOMINAL AORTOGRAM W/LOWER EXTREMITY N/A 09/04/2020   Procedure: ABDOMINAL AORTOGRAM W/LOWER EXTREMITY;  Surgeon: Sherren Kerns, MD;  Location: [facility];  Service: Cardiovascular;  Laterality: N/A;   COLONOSCOPY     x2   FEMORAL ARTERY EXPLORATION Left 09/04/2020   Procedure: FEMORAL ARTERY EXPLORATION;  Surgeon: Chuck Hint, MD;  Location: [facility] OR;  Service: Vascular;  Laterality: Left;   NASAL SINUS SURGERY     PERIPHERAL VASCULAR INTERVENTION Left 08/21/2020   Procedure: PERIPHERAL VASCULAR INTERVENTION;  Surgeon:  Sherren Kerns, MD;  Location: [facility];  Service: Cardiovascular;  Laterality: Left;  sfa stent x 3   PERIPHERAL VASCULAR INTERVENTION  09/04/2020   Procedure: PERIPHERAL VASCULAR INTERVENTION;  Surgeon: Sherren Kerns, MD;  Location: [facility];  Service: Cardiovascular;;  Right SFA   SHOULDER ARTHROSCOPY WITH ROTATOR CUFF REPAIR AND SUBACROMIAL DECOMPRESSION Left 02/04/2021   Procedure: SHOULDER ARTHROSCOPY WITH ROTATOR CUFF REPAIR AND SUBACROMIAL DECOMPRESSION;  Surgeon: Jones Broom, MD;  Location: [facility];  Service: Orthopedics;  Laterality: Left;   WISDOM TOOTH EXTRACTION     WRIST SURGERY     bilat    ROS:   General:  No weight loss, Fever, chills  HEENT: No recent headaches, no nasal bleeding, no visual changes, no sore throat  Neurologic: No dizziness, blackouts, seizures. No recent symptoms of stroke or mini- stroke. No recent episodes of slurred speech, or temporary blindness.  Cardiac: No recent episodes of chest pain/pressure, no shortness of breath at rest.  No shortness of breath with exertion.  Denies history of atrial fibrillation or irregular heartbeat  Vascular: No history of rest pain in feet.  No history of claudication.  No history of non-healing ulcer, No history of DVT   Pulmonary: No home oxygen, no productive cough, no hemoptysis,  No asthma or wheezing  Musculoskeletal:  [ ]  Arthritis, [ ]  Low back pain,  [ ]  Joint pain  Hematologic:No history of hypercoagulable state.  No history of easy bleeding.  No history of anemia  Gastrointestinal: No hematochezia or melena,  No gastroesophageal reflux, no trouble swallowing  Urinary: [ ]  chronic Kidney disease, [ ]  on HD - [ ]  MWF or [ ]  TTHS, [ ]  Burning with urination, [ ]  Frequent urination, [ ]  Difficulty urinating;   Skin: No rashes  Psychological: No history of anxiety,  No history of depression  Social History Social History   Tobacco Use   Smoking status: Every Day     Current packs/day: 0.50    Types: Cigarettes    Passive exposure: Never   Smokeless tobacco: Former   Tobacco comments:    1/2 PACK A DAY  Vaping Use   Vaping status: Never Used  Substance Use Topics   Alcohol use: Not Currently   Drug use: No    Family History Family History  Problem Relation Age of Onset   Colon cancer Father    Lung cancer Father    Atrial fibrillation Mother    Autoimmune disease Sister    Healthy Daughter    Healthy Son     Allergies  Allergies  Allergen Reactions   Aspirin Other (See Comments) and Cough    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     aspirin EC 81 MG tablet Take 1 tablet (81 mg total) by mouth daily. (Patient taking differently: Take 81 mg by mouth 3 (three) times a week.) 150 tablet 2   atorvastatin (LIPITOR) 80 MG tablet Take 80 mg by mouth every evening.     clopidogrel (PLAVIX) 75 MG tablet TAKE ONE TABLET BY MOUTH DAILY 30 tablet 11   escitalopram (LEXAPRO) 5 MG tablet Take 5 mg by mouth daily.     Febuxostat 80 MG TABS Take 80 mg by mouth every evening.     gabapentin (NEURONTIN) 300 MG capsule Take 300 mg by mouth 3 (three) times daily.     levothyroxine (SYNTHROID, LEVOTHROID) 200 MCG tablet Take 200 mcg by mouth at bedtime.     losartan (COZAAR) 100 MG tablet Take 100 mg by mouth every evening.      metFORMIN (GLUCOPHAGE-XR) 500 MG 24 hr tablet Take 500 mg by mouth 2  (two) times daily.     metoprolol succinate (TOPROL-XL) 50 MG 24 hr tablet Take 1 tablet (50 mg total) by mouth daily. 90 tablet 3   sertraline (ZOLOFT) 25 MG tablet Take 1 tablet (25 mg total) by mouth daily. 30 tablet 5   tadalafil (CIALIS) 20 MG tablet Take 20 mg by mouth daily as needed for erectile dysfunction.      temazepam (RESTORIL) 30 MG capsule Take 30 mg by mouth at bedtime.     No  current facility-administered medications for this visit.    Physical Examination  Vitals:   05/04/23 1437  BP: (!) 149/82  Pulse: 92  Resp: 20  Temp: 98.2 F (36.8 C)  SpO2: 95%  Weight: 218 lb (98.9 kg)  Height: 6\' 1"  (1.854 m)    Body mass index is 28.76 kg/m.  General:  Alert and oriented, no acute distress HEENT: Normal Neck: No bruit or JVD Pulmonary: Clear to auscultation bilaterally Cardiac: Regular Rate and Rhythm without murmur Abdomen: Soft, non-tender, non-distended, no mass, no scars Skin: No rash Extremity Pulses:   radial,  femoral, dorsalis pedis,  pulses bilaterally Musculoskeletal: No deformity or edema  Neurologic: Upper and lower extremity motor 5/5 and symmetric  DATA:     AABI Findings:  +---------+------------------+-----+-----------+--------+  Right   Rt Pressure (mmHg)IndexWaveform   Comment   +---------+------------------+-----+-----------+--------+  Brachial 117                                         +---------+------------------+-----+-----------+--------+  PTA     146               1.11 multiphasic          +---------+------------------+-----+-----------+--------+  DP      137               1.04 multiphasic          +---------+------------------+-----+-----------+--------+  Great Toe108               0.82 Normal               +---------+------------------+-----+-----------+--------+   +---------+------------------+-----+-----------+-------+  Left    Lt Pressure (mmHg)IndexWaveform   Comment   +---------+------------------+-----+-----------+-------+  Brachial 132                                        +---------+------------------+-----+-----------+-------+  PTA     144               1.09 multiphasic         +---------+------------------+-----+-----------+-------+  DP      137               1.04 multiphasic         +---------+------------------+-----+-----------+-------+  Great Toe118               0.89 Normal              +---------+------------------+-----+-----------+-------+   +-------+-----------+-----------+------------+------------+  ABI/TBIToday's ABIToday's TBIPrevious ABIPrevious TBI  +-------+-----------+-----------+------------+------------+  Right 1.11       0.82       1.23        1.08          +-------+-----------+-----------+------------+------------+  Left  1.09       0.89       1.04        1.05          +-------+-----------+-----------+------------+------------+        Arterial wall calcification precludes accurate ankle pressures and ABIs.  PPG tracings display appropriate pulsatility.  Bilateral ABIs appear essentially unchanged. Bilateral TBIs appear  decreased. Decreased TBIs are still within the normal range    Summary:  Right: Resting right ankle-brachial index is within normal range. The  right toe-brachial index is normal.   Left: Resting left ankle-brachial index  is within normal range. The left  toe-brachial index is normal.   ----------+--------+-----+---------------+---------+--------+  RIGHT    PSV cm/sRatioStenosis       Waveform Comments  +----------+--------+-----+---------------+---------+--------+  CFA Distal96                          triphasic          +----------+--------+-----+---------------+---------+--------+  DFA      224          50-74% stenosistriphasic          +----------+--------+-----+---------------+---------+--------+  SFA Prox  108                          triphasic          +----------+--------+-----+---------------+---------+--------+  SFA Distal85                          triphasic          +----------+--------+-----+---------------+---------+--------+  POP Prox  109                         triphasic          +----------+--------+-----+---------------+---------+--------+       Right Stent(s):  +---------------+--------+--------+---------+--------+  Right SFA stentPSV cm/sStenosisWaveform Comments  +---------------+--------+--------+---------+--------+  Prox to Stent  108             triphasic          +---------------+--------+--------+---------+--------+  Proximal Stent 167             triphasic          +---------------+--------+--------+---------+--------+  Mid Stent      112             triphasic          +---------------+--------+--------+---------+--------+  Distal Stent   90              triphasic          +---------------+--------+--------+---------+--------+  Distal to Stent85              triphasic          +---------------+--------+--------+---------+--------+            +----------+--------+-----+---------------+---------------------+--------+  LEFT     PSV cm/sRatioStenosis       Waveform             Comments  +----------+--------+-----+---------------+---------------------+--------+  CFA Distal150          30-49% stenosistriphasic                      +----------+--------+-----+---------------+---------------------+--------+  DFA      61                          technically triphasicblunted   +----------+--------+-----+---------------+---------------------+--------+  SFA Distal84                          triphasic                      +----------+--------+-----+---------------+---------------------+--------+  POP Prox  92                          triphasic                       +----------+--------+-----+---------------+---------------------+--------+  Left Stent(s):  +---------------+--------+--------+---------+---------------------------+  Left SFA stent PSV cm/sStenosisWaveform Comments                     +---------------+--------+--------+---------+---------------------------+  Prox to Stent  150     30-49%  triphasicCFA                          +---------------+--------+--------+---------+---------------------------+  Proximal Stent 108             triphasic                             +---------------+--------+--------+---------+---------------------------+  Mid Stent      88              triphasicperistent hypoechoic region  +---------------+--------+--------+---------+---------------------------+  Distal Stent   74              triphasic                             +---------------+--------+--------+---------+---------------------------+  Distal to Stent84              triphasic                             +---------------+--------+--------+---------+---------------------------+       Summary:  Right: 50-74% stenosis noted in the deep femoral artery. Patent stent with  no evidence of stenosis in the superficial femoral artery artery.   Left: 30-49% stenosis noted in the common femoral artery. Patent stent  with no evidence of stenosis in the superficial femoral artery.   ASSESSMENT/PLAN:  57 y.o. male here for follow up for PAD with hx of He underwent stenting of left SFA on 08/21/2020 by Dr. Darrick Penna.  He also had right SFA stenting on 09/04/2020.  This was complicated by hematoma in his left groin requiring primary repair by Dr. Edilia Bo.   He denies new symptoms of claudication rest pain or noon healing wounds.  The ABI's are stable with mild stenosis with DFA PSV 224 up from 220 last visit.    He will stay as busy as he can and continue statin/asa/plavix.  He will f/u in 1 year for repeat studies.  If he has  concerns he will call sooner.       Mosetta Pigeon PA-C [facility] [place] Office: [phone]  MD on call [facility]

## 2023-05-06 ENCOUNTER — Other Ambulatory Visit: Payer: Self-pay

## 2023-05-06 DIAGNOSIS — I739 Peripheral vascular disease, unspecified: Secondary | ICD-10-CM

## 2023-09-28 ENCOUNTER — Other Ambulatory Visit: Payer: Self-pay

## 2023-09-28 MED ORDER — CLOPIDOGREL BISULFATE 75 MG PO TABS: 75.0000 mg | ORAL_TABLET | Freq: Every day | ORAL | 11 refills | Status: DC

## 2023-11-14 ENCOUNTER — Inpatient Hospital Stay (HOSPITAL_COMMUNITY)
Admission: EM | Admit: 2023-11-14 | Discharge: 2023-11-17 | DRG: 286 | Disposition: A | Discharge status: Home / Self Care | Payer: 59 | Attending: Family Medicine | Admitting: Family Medicine

## 2023-11-14 ENCOUNTER — Telehealth: Payer: Self-pay | Admitting: Cardiology

## 2023-11-14 ENCOUNTER — Emergency Department (HOSPITAL_COMMUNITY): Payer: 59

## 2023-11-14 ENCOUNTER — Other Ambulatory Visit: Payer: Self-pay

## 2023-11-14 ENCOUNTER — Encounter (HOSPITAL_COMMUNITY): Payer: Self-pay

## 2023-11-14 DIAGNOSIS — I739 Peripheral vascular disease, unspecified: Secondary | ICD-10-CM | POA: Diagnosis present

## 2023-11-14 DIAGNOSIS — E1151 Type 2 diabetes mellitus with diabetic peripheral angiopathy without gangrene: Secondary | ICD-10-CM | POA: Diagnosis present

## 2023-11-14 DIAGNOSIS — I5043 Acute on chronic combined systolic (congestive) and diastolic (congestive) heart failure: Secondary | ICD-10-CM | POA: Diagnosis present

## 2023-11-14 DIAGNOSIS — I272 Pulmonary hypertension, unspecified: Secondary | ICD-10-CM | POA: Diagnosis present

## 2023-11-14 DIAGNOSIS — I255 Ischemic cardiomyopathy: Secondary | ICD-10-CM | POA: Diagnosis present

## 2023-11-14 DIAGNOSIS — I130 Hypertensive heart and chronic kidney disease with heart failure and stage 1 through stage 4 chronic kidney disease, or unspecified chronic kidney disease: Principal | ICD-10-CM | POA: Diagnosis present

## 2023-11-14 DIAGNOSIS — I5042 Chronic combined systolic (congestive) and diastolic (congestive) heart failure: Secondary | ICD-10-CM

## 2023-11-14 DIAGNOSIS — I5030 Unspecified diastolic (congestive) heart failure: Secondary | ICD-10-CM

## 2023-11-14 DIAGNOSIS — M109 Gout, unspecified: Secondary | ICD-10-CM | POA: Insufficient documentation

## 2023-11-14 DIAGNOSIS — D72829 Elevated white blood cell count, unspecified: Secondary | ICD-10-CM | POA: Diagnosis present

## 2023-11-14 DIAGNOSIS — F17210 Nicotine dependence, cigarettes, uncomplicated: Secondary | ICD-10-CM | POA: Diagnosis present

## 2023-11-14 DIAGNOSIS — I700 Atherosclerosis of aorta: Secondary | ICD-10-CM | POA: Diagnosis present

## 2023-11-14 DIAGNOSIS — E119 Type 2 diabetes mellitus without complications: Secondary | ICD-10-CM | POA: Diagnosis not present

## 2023-11-14 DIAGNOSIS — I509 Heart failure, unspecified: Principal | ICD-10-CM

## 2023-11-14 DIAGNOSIS — I493 Ventricular premature depolarization: Secondary | ICD-10-CM | POA: Diagnosis not present

## 2023-11-14 DIAGNOSIS — Z9181 History of falling: Secondary | ICD-10-CM

## 2023-11-14 DIAGNOSIS — N179 Acute kidney failure, unspecified: Secondary | ICD-10-CM | POA: Diagnosis present

## 2023-11-14 DIAGNOSIS — Z79899 Other long term (current) drug therapy: Secondary | ICD-10-CM

## 2023-11-14 DIAGNOSIS — M159 Polyosteoarthritis, unspecified: Secondary | ICD-10-CM | POA: Diagnosis present

## 2023-11-14 DIAGNOSIS — Z7982 Long term (current) use of aspirin: Secondary | ICD-10-CM

## 2023-11-14 DIAGNOSIS — Z79890 Hormone replacement therapy (postmenopausal): Secondary | ICD-10-CM | POA: Diagnosis not present

## 2023-11-14 DIAGNOSIS — Z886 Allergy status to analgesic agent status: Secondary | ICD-10-CM | POA: Diagnosis not present

## 2023-11-14 DIAGNOSIS — I5023 Acute on chronic systolic (congestive) heart failure: Secondary | ICD-10-CM

## 2023-11-14 DIAGNOSIS — Z7984 Long term (current) use of oral hypoglycemic drugs: Secondary | ICD-10-CM | POA: Diagnosis not present

## 2023-11-14 DIAGNOSIS — Z91199 Patient's noncompliance with other medical treatment and regimen due to unspecified reason: Secondary | ICD-10-CM

## 2023-11-14 DIAGNOSIS — I5022 Chronic systolic (congestive) heart failure: Secondary | ICD-10-CM

## 2023-11-14 DIAGNOSIS — N182 Chronic kidney disease, stage 2 (mild): Secondary | ICD-10-CM | POA: Diagnosis present

## 2023-11-14 DIAGNOSIS — N189 Chronic kidney disease, unspecified: Secondary | ICD-10-CM | POA: Diagnosis not present

## 2023-11-14 DIAGNOSIS — C8590 Non-Hodgkin lymphoma, unspecified, unspecified site: Secondary | ICD-10-CM | POA: Diagnosis present

## 2023-11-14 DIAGNOSIS — I10 Essential (primary) hypertension: Secondary | ICD-10-CM | POA: Diagnosis not present

## 2023-11-14 DIAGNOSIS — E038 Other specified hypothyroidism: Secondary | ICD-10-CM

## 2023-11-14 DIAGNOSIS — E785 Hyperlipidemia, unspecified: Secondary | ICD-10-CM | POA: Insufficient documentation

## 2023-11-14 DIAGNOSIS — Z801 Family history of malignant neoplasm of trachea, bronchus and lung: Secondary | ICD-10-CM

## 2023-11-14 DIAGNOSIS — Z8673 Personal history of transient ischemic attack (TIA), and cerebral infarction without residual deficits: Secondary | ICD-10-CM

## 2023-11-14 DIAGNOSIS — E871 Hypo-osmolality and hyponatremia: Secondary | ICD-10-CM | POA: Diagnosis present

## 2023-11-14 DIAGNOSIS — Z880 Allergy status to penicillin: Secondary | ICD-10-CM

## 2023-11-14 DIAGNOSIS — E1122 Type 2 diabetes mellitus with diabetic chronic kidney disease: Secondary | ICD-10-CM | POA: Diagnosis present

## 2023-11-14 DIAGNOSIS — E7849 Other hyperlipidemia: Secondary | ICD-10-CM | POA: Diagnosis not present

## 2023-11-14 DIAGNOSIS — Z7902 Long term (current) use of antithrombotics/antiplatelets: Secondary | ICD-10-CM

## 2023-11-14 DIAGNOSIS — I2510 Atherosclerotic heart disease of native coronary artery without angina pectoris: Secondary | ICD-10-CM | POA: Diagnosis present

## 2023-11-14 DIAGNOSIS — E039 Hypothyroidism, unspecified: Secondary | ICD-10-CM | POA: Diagnosis present

## 2023-11-14 DIAGNOSIS — Z91148 Patient's other noncompliance with medication regimen for other reason: Secondary | ICD-10-CM

## 2023-11-14 DIAGNOSIS — F411 Generalized anxiety disorder: Secondary | ICD-10-CM | POA: Insufficient documentation

## 2023-11-14 DIAGNOSIS — D472 Monoclonal gammopathy: Secondary | ICD-10-CM | POA: Insufficient documentation

## 2023-11-14 DIAGNOSIS — Z91040 Latex allergy status: Secondary | ICD-10-CM

## 2023-11-14 DIAGNOSIS — Z888 Allergy status to other drugs, medicaments and biological substances status: Secondary | ICD-10-CM

## 2023-11-14 DIAGNOSIS — Z800 Family history of malignant neoplasm of digestive organs: Secondary | ICD-10-CM

## 2023-11-14 LAB — CBC
HCT: 39.3 % (ref 39.0–52.0)
Hemoglobin: 13 g/dL (ref 13.0–17.0)
MCH: 29.5 pg (ref 26.0–34.0)
MCHC: 33.1 g/dL (ref 30.0–36.0)
MCV: 89.3 fL (ref 80.0–100.0)
Platelets: 340 10*3/uL (ref 150–400)
RBC: 4.4 MIL/uL (ref 4.22–5.81)
RDW: 14.4 % (ref 11.5–15.5)
WBC: 10.6 10*3/uL — ABNORMAL HIGH (ref 4.0–10.5)
nRBC: 0 % (ref 0.0–0.2)

## 2023-11-14 LAB — BASIC METABOLIC PANEL
Anion gap: 10 (ref 5–15)
BUN: 24 mg/dL — ABNORMAL HIGH (ref 6–20)
CO2: 22 mmol/L (ref 22–32)
Calcium: 9.3 mg/dL (ref 8.9–10.3)
Chloride: 101 mmol/L (ref 98–111)
Creatinine, Ser: 1.44 mg/dL — ABNORMAL HIGH (ref 0.61–1.24)
GFR, Estimated: 56 mL/min — ABNORMAL LOW (ref >60)
Glucose, Bld: 166 mg/dL — ABNORMAL HIGH (ref 70–99)
Potassium: 3.7 mmol/L (ref 3.5–5.1)
Sodium: 133 mmol/L — ABNORMAL LOW (ref 135–145)

## 2023-11-14 LAB — GLUCOSE, CAPILLARY: Glucose-Capillary: 186 mg/dL — ABNORMAL HIGH (ref 70–99)

## 2023-11-14 LAB — HEMOGLOBIN A1C
Hgb A1c MFr Bld: 6 % — ABNORMAL HIGH (ref 4.8–5.6)
Mean Plasma Glucose: 125.5 mg/dL

## 2023-11-14 LAB — D-DIMER, QUANTITATIVE: D-Dimer, Quant: 1.43 ug{FEU}/mL — ABNORMAL HIGH (ref 0.00–0.50)

## 2023-11-14 LAB — BRAIN NATRIURETIC PEPTIDE: B Natriuretic Peptide: 1179.6 pg/mL — ABNORMAL HIGH (ref 0.0–100.0)

## 2023-11-14 MED ORDER — FUROSEMIDE 10 MG/ML IJ SOLN: 20.0000 mg | Freq: Two times a day (BID) | INTRAMUSCULAR | Status: DC

## 2023-11-14 MED ORDER — ACETAMINOPHEN 650 MG RE SUPP: 650.0000 mg | Freq: Four times a day (QID) | RECTAL | Status: DC | PRN

## 2023-11-14 MED ORDER — ONDANSETRON HCL 4 MG PO TABS: 4.0000 mg | ORAL_TABLET | Freq: Four times a day (QID) | ORAL | Status: DC | PRN

## 2023-11-14 MED ORDER — LEVOTHYROXINE SODIUM 100 MCG PO TABS
200.0000 ug | ORAL_TABLET | Freq: Every day | ORAL | Status: DC
Start: 2023-11-15 — End: 2023-11-17
  Administered 2023-11-15 – 2023-11-17 (×3): 200 ug via ORAL
  Filled 2023-11-14 (×3): qty 2

## 2023-11-14 MED ORDER — FUROSEMIDE 10 MG/ML IJ SOLN
80.0000 mg | Freq: Once | INTRAMUSCULAR | Status: AC
  Administered 2023-11-14: 80 mg via INTRAVENOUS
  Filled 2023-11-14: qty 8

## 2023-11-14 MED ORDER — SODIUM CHLORIDE 0.9 % IV SOLN: 250.0000 mL | INTRAVENOUS | Status: AC | PRN

## 2023-11-14 MED ORDER — TEMAZEPAM 15 MG PO CAPS: 30.0000 mg | ORAL_CAPSULE | Freq: Every day | ORAL | Status: DC

## 2023-11-14 MED ORDER — ACETAMINOPHEN 325 MG PO TABS: 650.0000 mg | ORAL_TABLET | Freq: Four times a day (QID) | ORAL | Status: DC | PRN

## 2023-11-14 MED ORDER — TEMAZEPAM 15 MG PO CAPS
30.0000 mg | ORAL_CAPSULE | Freq: Every day | ORAL | Status: DC
  Administered 2023-11-15 – 2023-11-16 (×2): 30 mg via ORAL
  Filled 2023-11-14 (×2): qty 2

## 2023-11-14 MED ORDER — POTASSIUM CHLORIDE CRYS ER 20 MEQ PO TBCR
40.0000 meq | EXTENDED_RELEASE_TABLET | Freq: Once | ORAL | Status: AC
  Administered 2023-11-14: 40 meq via ORAL
  Filled 2023-11-14: qty 2

## 2023-11-14 MED ORDER — SODIUM CHLORIDE 0.9% FLUSH: 3.0000 mL | INTRAVENOUS | Status: DC | PRN

## 2023-11-14 MED ORDER — SODIUM CHLORIDE 0.9% FLUSH
3.0000 mL | Freq: Two times a day (BID) | INTRAVENOUS | Status: DC
  Administered 2023-11-14 – 2023-11-17 (×4): 3 mL via INTRAVENOUS

## 2023-11-14 MED ORDER — CLOPIDOGREL BISULFATE 75 MG PO TABS
75.0000 mg | ORAL_TABLET | Freq: Every day | ORAL | Status: DC
  Administered 2023-11-14 – 2023-11-17 (×4): 75 mg via ORAL
  Filled 2023-11-14 (×4): qty 1

## 2023-11-14 MED ORDER — IOHEXOL 350 MG/ML SOLN
75.0000 mL | Freq: Once | INTRAVENOUS | Status: AC | PRN
  Administered 2023-11-14: 75 mL via INTRAVENOUS

## 2023-11-14 MED ORDER — INSULIN ASPART 100 UNIT/ML IJ SOLN
0.0000 [IU] | Freq: Every day | INTRAMUSCULAR | Status: DC
  Filled 2023-11-14: qty 0.05

## 2023-11-14 MED ORDER — SERTRALINE HCL 50 MG PO TABS: 25.0000 mg | ORAL_TABLET | Freq: Every day | ORAL | Status: DC

## 2023-11-14 MED ORDER — INSULIN ASPART 100 UNIT/ML IJ SOLN
0.0000 [IU] | Freq: Three times a day (TID) | INTRAMUSCULAR | Status: DC
  Administered 2023-11-17: 1 [IU] via SUBCUTANEOUS
  Filled 2023-11-14: qty 0.06

## 2023-11-14 MED ORDER — SERTRALINE HCL 25 MG PO TABS
25.0000 mg | ORAL_TABLET | Freq: Every day | ORAL | Status: DC
  Administered 2023-11-15 – 2023-11-17 (×3): 25 mg via ORAL
  Filled 2023-11-14 (×3): qty 1

## 2023-11-14 MED ORDER — HYDROCODONE-ACETAMINOPHEN 7.5-325 MG PO TABS
1.0000 | ORAL_TABLET | Freq: Once | ORAL | Status: AC | PRN
  Administered 2023-11-15: 1 via ORAL
  Filled 2023-11-14: qty 1

## 2023-11-14 MED ORDER — ATORVASTATIN CALCIUM 40 MG PO TABS
80.0000 mg | ORAL_TABLET | Freq: Every evening | ORAL | Status: DC
  Administered 2023-11-14 – 2023-11-16 (×3): 80 mg via ORAL
  Filled 2023-11-14 (×3): qty 2

## 2023-11-14 MED ORDER — LEVOTHYROXINE SODIUM 100 MCG PO TABS: 200.0000 ug | ORAL_TABLET | Freq: Every day | ORAL | Status: DC

## 2023-11-14 MED ORDER — METOPROLOL SUCCINATE ER 50 MG PO TB24
50.0000 mg | ORAL_TABLET | Freq: Every day | ORAL | Status: DC
  Administered 2023-11-15 – 2023-11-17 (×3): 50 mg via ORAL
  Filled 2023-11-14 (×3): qty 1

## 2023-11-14 MED ORDER — ONDANSETRON HCL 4 MG/2ML IJ SOLN: 4.0000 mg | Freq: Four times a day (QID) | INTRAMUSCULAR | Status: DC | PRN

## 2023-11-14 MED ORDER — ESCITALOPRAM OXALATE 10 MG PO TABS
5.0000 mg | ORAL_TABLET | Freq: Every day | ORAL | Status: DC
  Administered 2023-11-14 – 2023-11-17 (×4): 5 mg via ORAL
  Filled 2023-11-14 (×4): qty 1

## 2023-11-14 MED ORDER — ENOXAPARIN SODIUM 40 MG/0.4ML IJ SOSY
40.0000 mg | PREFILLED_SYRINGE | INTRAMUSCULAR | Status: DC
  Administered 2023-11-14 – 2023-11-16 (×3): 40 mg via SUBCUTANEOUS
  Filled 2023-11-14 (×3): qty 0.4

## 2023-11-14 MED ORDER — NICOTINE POLACRILEX 2 MG MT GUM
2.0000 mg | CHEWING_GUM | OROMUCOSAL | Status: DC | PRN
  Administered 2023-11-14 – 2023-11-17 (×5): 2 mg via ORAL
  Filled 2023-11-14 (×4): qty 1
  Filled 2023-11-14: qty 5
  Filled 2023-11-14 (×3): qty 1

## 2023-11-14 NOTE — ED Notes (Signed)
ED TO INPATIENT HANDOFF REPORT  Name/Age/Gender Dustin Sampson 58 y.o. male  Code Status    Code Status Orders  (From admission, onward)           Start     Ordered   11/14/23 2017  Full code  Continuous       Question:  By:  Answer:  Consent: discussion documented in EHR   11/14/23 2017           Code Status History     Date Active Date Inactive Code Status Order ID Comments User Context   09/04/2020 1311 09/04/2020 2031 Full Code [id number]  Sherren Kerns, MD Inpatient       Home/SNF/Other Home  Chief Complaint Acute exacerbation of CHF (congestive heart failure) (HCC) [I50.9]  Level of Care/Admitting Diagnosis ED Disposition     ED Disposition  Admit   Condition  --   Comment  Hospital Area: [facility] [[id number]]  Level of Care: Telemetry [5]  Admit to tele based on following criteria: Other see comments  Comments: Monitor for arrhythmia  May admit patient to [facility] or [facility] if equivalent level of care is available:: No  Covid Evaluation: Asymptomatic - no recent exposure (last 10 days) testing not required  Diagnosis: Acute exacerbation of CHF (congestive heart failure) [facility]) [[id number]]  Admitting Physician: Tereasa Coop [[id number]]  Attending Physician: Tereasa Coop [[id number]]  Certification:: I certify this patient will need inpatient services for at least 2 midnights  Expected Medical Readiness: 11/18/2023          Medical History Past Medical History:  Diagnosis Date   Anxiety    Arthritis    Diabetes mellitus without complication (HCC)    Gout    Hyperlipemia    Hypertension    Hypothyroidism    Insomnia    Pneumonia    Stroke [facility])    Thyroid disease     Allergies 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; "it makes me  bleed"   Moxifloxacin Nausea And Vomiting    "made me real sick"  "made me real sick",   Avelox [Moxifloxacin Hcl In Nacl] Nausea And Vomiting and Other (See Comments)    "made me really sick"   Colchicine Diarrhea   Latex Other (See Comments)    Irritates the skin  Other Reaction(s): Other (See Comments)    Irritates the skin   Nsaids Other (See Comments)    Avoids due to kidney issues  Avoid due to kidney issues   Penicillins Other (See Comments)    Severe GI upset- occurred in childhood    IV Location/Drains/Wounds Patient Lines/Drains/Airways Status     Active Line/Drains/Airways     Name Placement date Placement time Site Days   Peripheral IV 11/14/23 Left Antecubital 11/14/23  1604  Antecubital  less than 1   Sheath 09/04/20 Left Arterial;Femoral 09/04/20  0801  Arterial;Femoral  1166            Labs/Imaging Results for orders placed or performed during the hospital encounter of 11/14/23 (from the past 48 hours)  Basic metabolic panel     Status: Abnormal   Collection Time: 11/14/23  2:19 PM  Result Value Ref Range   Sodium 133 (L) 135 - 145 mmol/L   Potassium 3.7 3.5 -  5.1 mmol/L   Chloride 101 98 - 111 mmol/L   CO2 22 22 - 32 mmol/L   Glucose, Bld 166 (H) 70 - 99 mg/dL    Comment: Glucose reference range applies only to samples taken after fasting for at least 8 hours.   BUN 24 (H) 6 - 20 mg/dL   Creatinine, Ser 4.03 (H) 0.61 - 1.24 mg/dL   Calcium 9.3 8.9 - 47.4 mg/dL   GFR, Estimated 56 (L) >60 mL/min    Comment: (NOTE) Calculated using the CKD-EPI Creatinine Equation (2021)    Anion gap 10 5 - 15    Comment: Performed at [facility], [address], [place], [place] [address]  CBC     Status: Abnormal   Collection Time: 11/14/23  2:19 PM  Result Value Ref Range   WBC 10.6 (H) 4.0 - 10.5 K/uL   RBC 4.40 4.22 - 5.81 MIL/uL   Hemoglobin 13.0 13.0 - 17.0 g/dL   HCT 38.7 56.4 - 33.2 %   MCV 89.3 80.0 - 100.0 fL   MCH 29.5 26.0  - 34.0 pg   MCHC 33.1 30.0 - 36.0 g/dL   RDW 95.1 88.4 - 16.6 %   Platelets 340 150 - 400 K/uL   nRBC 0.0 0.0 - 0.2 %    Comment: Performed at [facility], [address] [place], [place] [address]  Brain natriuretic peptide     Status: Abnormal   Collection Time: 11/14/23  2:19 PM  Result Value Ref Range   B Natriuretic Peptide 1,179.6 (H) 0.0 - 100.0 pg/mL    Comment: Performed at [facility], [address] [place], [place] [address]  D-dimer, quantitative     Status: Abnormal   Collection Time: 11/14/23  4:27 PM  Result Value Ref Range   D-Dimer, Quant 1.43 (H) 0.00 - 0.50 ug/mL-FEU    Comment: (NOTE) At the manufacturer cut-off value of 0.5 g/mL FEU, this assay has a negative predictive value of 95-100%.This assay is intended for use in conjunction with a clinical pretest probability (PTP) assessment model to exclude pulmonary embolism (PE) and deep venous thrombosis (DVT) in outpatients suspected of PE or DVT. Results should be correlated with clinical presentation. Performed at [facility], [address], [place], [place] [address]    CT Angio Chest PE W and/or Wo Contrast Result Date: 11/14/2023 CLINICAL DATA:  Pulmonary embolism (PE) suspected, high prob. Shortness of breath EXAM: CT ANGIOGRAPHY CHEST WITH CONTRAST TECHNIQUE: Multidetector CT imaging of the chest was performed using the standard protocol during bolus administration of intravenous contrast. Multiplanar CT image reconstructions and MIPs were obtained to evaluate the vascular anatomy. RADIATION DOSE REDUCTION: This exam was performed according to the departmental dose-optimization program which includes automated exposure control, adjustment of the mA and/or kV according to patient size and/or use of iterative reconstruction technique. CONTRAST:  75mL OMNIPAQUE IOHEXOL 350 MG/ML SOLN COMPARISON:  06/20/2022 FINDINGS: Cardiovascular: No filling defects in  the pulmonary arteries to suggest pulmonary emboli. Heart is normal size. Aorta is normal caliber. Scattered coronary artery and aortic calcifications. Mediastinum/Nodes: No mediastinal, hilar, or axillary adenopathy. Trachea and esophagus are unremarkable. Thyroid unremarkable. Lungs/Pleura: Small to moderate bilateral pleural effusions. No confluent airspace opacities. Upper Abdomen: No acute findings Musculoskeletal: Chest wall soft tissues are unremarkable. No acute bony abnormality. Review of the MIP images confirms the above findings. IMPRESSION: No evidence of pulmonary embolus. Small to moderate bilateral pleural effusions. Scattered coronary artery  disease. Aortic Atherosclerosis (ICD10-I70.0). Electronically Signed   By: Charlett Nose M.D.   On: 11/14/2023 18:40   DG Chest 2 View Result Date: 11/14/2023 CLINICAL DATA:  Shortness of breath. EXAM: CHEST - 2 VIEW COMPARISON:  04/02/2013 FINDINGS: The heart size and mediastinal contours are within normal limits. There is no evidence of pulmonary edema, consolidation, pneumothorax, nodule or pleural fluid. The visualized skeletal structures are unremarkable. IMPRESSION: No active cardiopulmonary disease. Electronically Signed   By: Irish Lack M.D.   On: 11/14/2023 15:01    Pending Labs Unresulted Labs (From admission, onward)     Start     Ordered   11/15/23 0500  Comprehensive metabolic panel  Tomorrow morning,   R        11/14/23 2017   11/15/23 0500  CBC  Tomorrow morning,   R        11/14/23 2017   11/14/23 2018  HIV Antibody (routine testing w rflx)  (HIV Antibody (Routine testing w reflex) panel)  Once,   R        11/14/23 2017            Vitals/Pain Today's Vitals   11/14/23 1353 11/14/23 1400 11/14/23 1411  BP: 130/83 125/83   Pulse: (!) 104 99   Resp: 16 15   Temp:  98 F (36.7 C)   SpO2: 100% 99%   Weight:   216 lb 0.8 oz (98 kg)  Height:   6\' 1"  (1.854 m)  PainSc:   0-No pain    Isolation Precautions No  active isolations  Medications Medications  atorvastatin (LIPITOR) tablet 80 mg (has no administration in time range)  metoprolol succinate (TOPROL-XL) 24 hr tablet 50 mg (has no administration in time range)  escitalopram (LEXAPRO) tablet 5 mg (has no administration in time range)  sertraline (ZOLOFT) tablet 25 mg (has no administration in time range)  temazepam (RESTORIL) capsule 30 mg (has no administration in time range)  levothyroxine (SYNTHROID) tablet 200 mcg (has no administration in time range)  clopidogrel (PLAVIX) tablet 75 mg (has no administration in time range)  furosemide (LASIX) injection 20 mg (has no administration in time range)  enoxaparin (LOVENOX) injection 40 mg (has no administration in time range)  sodium chloride flush (NS) 0.9 % injection 3 mL (has no administration in time range)  sodium chloride flush (NS) 0.9 % injection 3 mL (has no administration in time range)  0.9 %  sodium chloride infusion (has no administration in time range)  acetaminophen (TYLENOL) tablet 650 mg (has no administration in time range)    Or  acetaminophen (TYLENOL) suppository 650 mg (has no administration in time range)  ondansetron (ZOFRAN) tablet 4 mg (has no administration in time range)    Or  ondansetron (ZOFRAN) injection 4 mg (has no administration in time range)  iohexol (OMNIPAQUE) 350 MG/ML injection 75 mL (75 mLs Intravenous Contrast Given 11/14/23 1808)  furosemide (LASIX) injection 80 mg (80 mg Intravenous Given 11/14/23 2015)  potassium chloride SA (KLOR-CON M) CR tablet 40 mEq (40 mEq Oral Given 11/14/23 2016)    Mobility walks

## 2023-11-14 NOTE — ED Notes (Signed)
Patient transported to floor via wheelchair at this time

## 2023-11-14 NOTE — Telephone Encounter (Signed)
Spoke with pt, for the last week he has been SOB  and yesterday he developed stomach pain. He was seen at urgent care and had an ECG and cxr. He had extra beats on the ECG and the cxr had a lot of white on it and he was told to go to the ER because they thought he was in heart failure. He did not go to the ER. Today he reports feeling some better. He was having orthopnea but last night, me slept fine with no problems. He has no swelling in his feet or ankles or abdomen and his weight is the sable. He does not have any palpitations or heart racing but he has taken the metoprolol twice daily for the last 3 days and thinks that is helping his SOB. He thinks this started after he fell and broke some ribs about 2 months ago and then he fell again in the same area. Follow up scheduled and Patient voiced understanding to go to the ER if his symptoms worsen or change prior to the appointment.

## 2023-11-14 NOTE — Telephone Encounter (Signed)
Patient stated he was seen at urgent care last night and was told to go to the ER. Patient stated he wanted to speak with [name]. Patient stated he had a hard time breathing yesterday and his stomach was upset. Patient wouldn't express any other concerns into why they asked him to go to the ER. Please advise.

## 2023-11-14 NOTE — ED Provider Notes (Signed)
[facility] EMERG[facility]Y DEPARTMENT AT [facility] Provider Note   CSN: [id number] Arrival date & time: 11/14/23  1341     History {Add pertinent medical, surgical, social history, OB history to HPI:1} Chief Complaint  Patient presents with   Shortness of Breath    Dustin Sampson is a 58 y.o. male.  This is a 58 year old male presenting emergency department with a chief complaint of shortness of breath reports worsening dyspnea on exertion x 2 weeks with orthopnea as well.  No lower extremity edema.  Denies chest pain.  Reports fall 4 months ago, unsure if that is related or not.  Not having significant chest wall pain.  No cough.  Does have history of cancer   Shortness of Breath      Home Medications Prior to Admission medications   Medication Sig Start Date End Date Taking? Authorizing Provider  acetaminophen (TYLENOL) 500 MG tablet Take 500-1,000 mg by mouth every 6 (six) hours as needed (for pain.).    [provider]  aspirin EC 81 MG tablet Take 1 tablet (81 mg total) by mouth daily. Patient taking differently: Take 81 mg by mouth 3 (three) times a week. 07/30/20   Sherren Kerns, MD  atorvastatin (LIPITOR) 80 MG tablet Take 80 mg by mouth every evening.    [provider]  clopidogrel (PLAVIX) 75 MG tablet Take 1 tablet (75 mg total) by mouth daily. 09/28/23   Nada Libman, MD  escitalopram (LEXAPRO) 5 MG tablet Take 5 mg by mouth daily. 05/23/22   [provider]  Febuxostat 80 MG TABS Take 80 mg by mouth every evening.    [provider]  gabapentin (NEURONTIN) 300 MG capsule Take 300 mg by mouth 3 (three) times daily. 02/26/19   [provider]  levothyroxine (SYNTHROID, LEVOTHROID) 200 MCG tablet Take 200 mcg by mouth at bedtime.    [provider]  losartan (COZAAR) 100 MG tablet Take 100 mg by mouth every evening.  07/20/20   [provider]  metFORMIN (GLUCOPHAGE-XR) 500 MG 24 hr tablet Take 500 mg  by mouth 2 (two) times daily.    [provider]  metoprolol succinate (TOPROL-XL) 50 MG 24 hr tablet Take 1 tablet (50 mg total) by mouth daily. 05/01/23   Jodelle Red, MD  sertraline (ZOLOFT) 25 MG tablet Take 1 tablet (25 mg total) by mouth daily. 06/16/21   Drema Dallas, DO  tadalafil (CIALIS) 20 MG tablet Take 20 mg by mouth daily as needed for erectile dysfunction.     [provider]  temazepam (RESTORIL) 30 MG capsule Take 30 mg by mouth at bedtime.    [provider]      Allergies    Aspirin, Moxifloxacin, Avelox [moxifloxacin hcl in nacl], Colchicine, Latex, Nsaids, and Penicillins    Review of Systems   Review of Systems  Respiratory:  Positive for shortness of breath.     Physical Exam Updated Vital Signs BP 125/83   Pulse 99   Temp 98 F (36.7 C)   Resp 15   Ht 6\' 1"  (1.854 m)   Wt 98 kg   SpO2 99%   BMI 28.50 kg/m  Physical Exam Vitals and nursing note reviewed.  Constitutional:      General: He is not in acute distress.    Appearance: He is obese. He is not toxic-appearing.  HENT:     Head: Normocephalic.  Cardiovascular:     Rate and Rhythm: Normal  rate and regular rhythm.  Pulmonary:     Effort: Pulmonary effort is normal.     Breath sounds: Normal breath sounds. No wheezing, rhonchi or rales.  Musculoskeletal:     Cervical back: Normal range of motion.     Right lower leg: No edema.     Left lower leg: No edema.  Skin:    General: Skin is warm.     Capillary Refill: Capillary refill takes less than 2 seconds.  Neurological:     General: No focal deficit present.     Mental Status: He is alert.  Psychiatric:        Mood and Affect: Mood normal.        Behavior: Behavior normal.     ED Results / Procedures / Treatments   Labs (all labs ordered are listed, but only abnormal results are displayed) Labs Reviewed  BASIC METABOLIC PANEL - Abnormal; Notable for the following components:      Result Value    Sodium 133 (*)    Glucose, Bld 166 (*)    BUN 24 (*)    Creatinine, Ser 1.44 (*)    GFR, Estimated 56 (*)    All other components within normal limits  CBC - Abnormal; Notable for the following components:   WBC 10.6 (*)    All other components within normal limits  BRAIN NATRIURETIC PEPTIDE  D-DIMER, QUANTITATIVE    EKG EKG Interpretation Date/Time:  Tuesday November 14 2023 13:50:36 EST Ventricular Rate:  107 PR Interval:  150 QRS Duration:  108 QT Interval:  356 QTC Calculation: 475 R Axis:   82  Text Interpretation: Sinus tachycardia Probable left atrial enlargement Probable anterolateral infarct, old Baseline wander in lead(s) I Confirmed by Estanislado Pandy [phone]) on 11/14/2023 3:01:47 PM  Radiology DG Chest 2 View Result Date: 11/14/2023 CLINICAL DATA:  Shortness of breath. EXAM: CHEST - 2 VIEW COMPARISON:  04/02/2013 FINDINGS: The heart size and mediastinal contours are within normal limits. There is no evidence of pulmonary edema, consolidation, pneumothorax, nodule or pleural fluid. The visualized skeletal structures are unremarkable. IMPRESSION: No active cardiopulmonary disease. Electronically Signed   By: Irish Lack M.D.   On: 11/14/2023 15:01    Procedures Procedures  {Document cardiac monitor, telemetry assessment procedure when appropriate:1}  Medications Ordered in ED Medications - No data to display  ED Course/ Medical Decision Making/ A&P Clinical Course as of 11/14/23 1536  Tue Nov 14, 2023  1536 Per chart review echo in November 2021 :"1. Left ventricular ejection fraction, by estimation, is 45%. The left  ventricle has mildly decreased function. The left ventricle demonstrates  regional wall motion abnormalities with inferoseptal and inferior  hypokinesis. Left ventricular diastolic  parameters are consistent with Grade I diastolic dysfunction (impaired  relaxation).   2. Right ventricular systolic function is normal. The right ventricular  size  is normal. Tricuspid regurgitation signal is inadequate for assessing  PA pressure.   3. The mitral valve is normal in structure. No evidence of mitral valve  regurgitation. No evidence of mitral stenosis.   4. The aortic valve is tricuspid. Aortic valve regurgitation is not  visualized. No aortic stenosis is present.   5. The inferior vena cava is normal in size with greater than 50%  respiratory variability, suggesting right atrial pressure of 3 mmHg.   " [TY]  1536 Basic metabolic panel(!) Hyponatremia noted.  Does have an elevation in his BUN and creatinine compared to priors. [TY]  1536 CBC(!) Mild  leukocytosis.  No anemia that would explain his shortness of breath. [TY]  1536 DG Chest 2 View IMPRESSION: No active cardiopulmonary disease.   [TY]    Clinical Course User Index [TY] Coral Spikes, DO   {   Click here for ABCD2, HEART and other calculatorsREFRESH Note before signing :1}                              Medical Decision Making This is a 58 year old male presenting emergency department with shortness of breath x 2 weeks.  Reports history of 1 part of his heart not beating well, but denies history of CHF; but does reportedly follow with cardiology.  Has an appointment upcoming with them on the third.  Symptoms seemingly aligning with heart failure, however does not clinically appear volume overloaded.  No lower extremity edema.  Lungs clear.  Workup as noted in ED course.  Patient does have history of cancer, therefore will get D-dimer to help exclude PE.  Amount and/or Complexity of Data Reviewed Labs: ordered. Decision-making details documented in ED Course. Radiology: ordered. Decision-making details documented in ED Course.   ***  {Document critical care time when appropriate:1} {Document review of labs and clinical decision tools ie heart score, Chads2Vasc2 etc:1}  {Document your independent review of radiology images, and any outside records:1} {Document your  discussion with family members, caretakers, and with consultants:1} {Document social determinants of health affecting pt's care:1} {Document your decision making why or why not admission, treatments were needed:1} Final Clinical Impression(s) / ED Diagnoses Final diagnoses:  None    Rx / DC Orders ED Discharge Orders     None

## 2023-11-14 NOTE — H&P (Addendum)
History and Physical    Dustin Sampson NUU:[id number] DOB: [birth year] DOA: 11/14/2023  PCP: Jarrett Soho, PA-C   Patient coming from: Home   Chief Complaint:  Chief Complaint  Patient presents with   Shortness of Breath   ED TRIAGE note:  C/o sob that is worse laying flat and abd pain and referred to ER for new onset of CHF. Denies extremity swelling. Denies cp.  Pt reports starting metoprolol 3 days ago with relief of sob.       HPI:  Dustin Sampson is a 58 y.o. 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  Reported that he he has exertional dyspnea for 2 weeks as well as orthopnea.  Denies lower extremity edema.  Denies any chest pain and palpitation during my evaluation at the bedside patient reported that he has not been compliant with his home medications except he is taking metoprolol and he is antidepressant medications.  Patient denies any fever, cough, sputum production, and headache.  No other complaint at this time.   ED Course:  At presentation to ED patient is hemodynamically stable.  Blood pressure is well-controlled. BMP showing low sodium 133, elevated blood glucose 166, elevated BUN 24, elevated creatinine 1.4 and GFR 56. CBC showing mild leukocytosis 10.6. Elevated elevated elevated BNP around 1200 and elevated D-dimer 1.43.  CTA chest no evidence of pulmonary embolism.  It showed small to moderate bilateral pleural effusion scattered coronary artery disease and aortic atherosclerosis.  Chest x-ray no acute cardiopulmonary disease.  ED physician Dr. Maple Hudson has been spoken with on-call cardiology recommended to give  Lasix 80 mg with oral KCl.  Cardiology will evaluate patient for formal consult. In the ED patient has been given IV Lasix 80 mg and oral KCl 40 mEq.  Hospitalist has been contacted for further evaluation management of CHF.   Significant labs in the ED: Lab Orders         Basic metabolic panel         CBC         Brain natriuretic peptide         D-dimer, quantitative         HIV Antibody (routine testing w rflx)         Comprehensive metabolic panel         CBC         Hemoglobin A1c         Glucose, capillary       Review of Systems:  Review of Systems  Constitutional:  Negative for chills, fever, malaise/fatigue and weight loss.  Respiratory:  Positive for shortness of breath. Negative for cough and sputum production.   Cardiovascular:  Positive for orthopnea. Negative for chest pain, claudication, leg swelling and PND.  Gastrointestinal:  Negative for abdominal pain, diarrhea, heartburn, nausea and vomiting.  Musculoskeletal:  Negative for myalgias and neck pain.  Neurological:  Negative for dizziness and headaches.  Endo/Heme/Allergies:  Does not bruise/bleed easily.  Psychiatric/Behavioral:  The patient is nervous/anxious.   All other systems reviewed and are negative.   Past Medical History:  Diagnosis Date   Anxiety    Arthritis    Diabetes mellitus without complication (HCC)    Gout  Hyperlipemia    Hypertension    Hypothyroidism    Insomnia    Pneumonia    Stroke [facility])    Thyroid disease     Past Surgical History:  Procedure Laterality Date   ABDOMINAL AORTOGRAM W/LOWER EXTREMITY Bilateral 08/21/2020   Procedure: ABDOMINAL AORTOGRAM W/LOWER EXTREMITY;  Surgeon: Sherren Kerns, MD;  Location: [facility];  Service: Cardiovascular;  Laterality: Bilateral;   ABDOMINAL AORTOGRAM W/LOWER EXTREMITY N/A 09/04/2020   Procedure: ABDOMINAL AORTOGRAM W/LOWER EXTREMITY;  Surgeon: Sherren Kerns, MD;  Location: [facility];  Service:  Cardiovascular;  Laterality: N/A;   COLONOSCOPY     x2   FEMORAL ARTERY EXPLORATION Left 09/04/2020   Procedure: FEMORAL ARTERY EXPLORATION;  Surgeon: Chuck Hint, MD;  Location: [facility] OR;  Service: Vascular;  Laterality: Left;   NASAL SINUS SURGERY     PERIPHERAL VASCULAR INTERVENTION Left 08/21/2020   Procedure: PERIPHERAL VASCULAR INTERVENTION;  Surgeon: Sherren Kerns, MD;  Location: [facility];  Service: Cardiovascular;  Laterality: Left;  sfa stent x 3   PERIPHERAL VASCULAR INTERVENTION  09/04/2020   Procedure: PERIPHERAL VASCULAR INTERVENTION;  Surgeon: Sherren Kerns, MD;  Location: [facility];  Service: Cardiovascular;;  Right SFA   SHOULDER ARTHROSCOPY WITH ROTATOR CUFF REPAIR AND SUBACROMIAL DECOMPRESSION Left 02/04/2021   Procedure: SHOULDER ARTHROSCOPY WITH ROTATOR CUFF REPAIR AND SUBACROMIAL DECOMPRESSION;  Surgeon: Jones Broom, MD;  Location: [facility];  Service: Orthopedics;  Laterality: Left;   WISDOM TOOTH EXTRACTION     WRIST SURGERY     bilat     reports that he has been smoking cigarettes. He has never been exposed to tobacco smoke. He has quit using smokeless tobacco. He reports that he does not currently use alcohol. He reports that he does not use drugs.  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; "it makes me bleed"   Moxifloxacin Nausea And Vomiting    "made me real sick"  "made me real sick",   Avelox [Moxifloxacin Hcl In Nacl] Nausea And Vomiting and Other (See Comments)    "made me really sick"   Colchicine Diarrhea   Latex Other (See Comments)    Irritates the skin  Other Reaction(s): Other (See Comments)    Irritates the skin   Nsaids Other (See Comments)    Avoids due to kidney issues  Avoid due to kidney issues   Penicillins Other (See  Comments)    Severe GI upset- occurred in childhood  Other Reaction(s): Not available  Other Reaction(s): GI Intolerance  REACTION: severe GI upset    Family History  Problem Relation Age of Onset   Colon cancer Father    Lung cancer Father    Atrial fibrillation Mother    Autoimmune disease Sister    Healthy Daughter    Healthy Son     Prior to Admission medications   Medication Sig Start Date End Date Taking? Authorizing Provider  acetaminophen (TYLENOL) 500 MG tablet Take 500-1,000 mg by mouth every 6 (six) hours as needed (for pain.).    [provider]  aspirin EC 81 MG tablet Take 1 tablet (81 mg total) by mouth daily. Patient taking differently: Take 81 mg by mouth 3 (three) times a week. 07/30/20   Sherren Kerns, MD  atorvastatin (LIPITOR) 80 MG tablet Take 80 mg by mouth every evening.    [provider]  clopidogrel (PLAVIX) 75 MG tablet Take 1 tablet (75 mg total) by mouth daily. 09/28/23   Nada Libman, MD  escitalopram (LEXAPRO) 5 MG tablet Take 5 mg by mouth daily. 05/23/22   [provider]  Febuxostat 80 MG TABS Take 80 mg by mouth every evening.    [provider]  gabapentin (NEURONTIN) 300 MG capsule Take 300 mg by mouth 3 (three) times daily. 02/26/19   [provider]  levothyroxine (SYNTHROID, LEVOTHROID) 200 MCG tablet Take 200 mcg by mouth at bedtime.    [provider]  losartan (COZAAR) 100 MG tablet Take 100 mg by mouth every evening.  07/20/20   [provider]  metFORMIN (GLUCOPHAGE-XR) 500 MG 24 hr tablet Take 500 mg by mouth 2 (two) times daily.    [provider]  metoprolol succinate (TOPROL-XL) 50 MG 24 hr tablet Take 1 tablet (50 mg total) by mouth daily. 05/01/23   Jodelle Red, MD  sertraline (ZOLOFT) 25 MG tablet Take 1 tablet (25 mg total) by mouth daily. 06/16/21   Drema Dallas, DO  tadalafil (CIALIS) 20 MG tablet Take 20 mg by mouth daily as needed for erectile  dysfunction.     [provider]  temazepam (RESTORIL) 30 MG capsule Take 30 mg by mouth at bedtime.    [provider]     Physical Exam: Vitals:   11/14/23 1411 11/14/23 2031 11/14/23 2033 11/14/23 2109  BP:   101/68 (!) 102/90  Pulse:   91 93  Resp:   16 17  Temp:  (!) 97.5 F (36.4 C)  (!) 97.5 F (36.4 C)  TempSrc:  Oral  Oral  SpO2:   94% 100%  Weight: 98 kg     Height: 6\' 1"  (1.854 m)       Physical Exam Vitals and nursing note reviewed.  Constitutional:      Appearance: He is not ill-appearing.  Cardiovascular:     Rate and Rhythm: Normal rate and regular rhythm.  Pulmonary:     Effort: Pulmonary effort is normal.     Breath sounds: Examination of the right-lower field reveals decreased breath sounds. Examination of the left-lower field reveals decreased breath sounds. Decreased breath sounds present. No wheezing, rhonchi or rales.  Musculoskeletal:     Cervical back: Neck supple.     Right lower leg: No edema.     Left lower leg: No edema.  Skin:    General: Skin is warm.     Capillary Refill: Capillary refill takes less than 2 seconds.  Neurological:     Mental Status: He is alert and oriented to person, place, and time.  Psychiatric:        Mood and Affect: Mood is anxious.        Behavior: Behavior is not agitated.      Labs on Admission: I have personally reviewed following labs and imaging studies  CBC: Recent Labs  Lab 11/14/23 1419  WBC 10.6*  HGB 13.0  HCT 39.3  MCV 89.3  PLT 340   Basic Metabolic Panel: Recent Labs  Lab 11/14/23 1419  NA 133*  K 3.7  CL 101  CO2 22  GLUCOSE 166*  BUN 24*  CREATININE 1.44*  CALCIUM 9.3   GFR: Estimated Creatinine Clearance: 68.9 mL/min (A) (by C-G formula based on SCr of 1.44 mg/dL (H)). Liver Function Tests:  No results for input(s): "AST", "ALT", "ALKPHOS", "BILITOT", "PROT", "ALBUMIN" in the last 168 hours. No results for input(s): "LIPASE", "AMYLASE" in the last 168  hours. No results for input(s): "AMMONIA" in the last 168 hours. Coagulation Profile: No results for input(s): "INR", "PROTIME" in the last 168 hours. Cardiac Enzymes: No results for input(s): "CKTOTAL", "CKMB", "CKMBINDEX", "TROPONINI", "TROPONINIHS" in the last 168 hours. BNP (last 3 results) Recent Labs    11/14/23 1419  BNP 1,179.6*   HbA1C: No results for input(s): "HGBA1C" in the last 72 hours. CBG: Recent Labs  Lab 11/14/23 2102  GLUCAP 186*   Lipid Profile: No results for input(s): "CHOL", "HDL", "LDLCALC", "TRIG", "CHOLHDL", "LDLDIRECT" in the last 72 hours. Thyroid Function Tests: No results for input(s): "TSH", "T4TOTAL", "FREET4", "T3FREE", "THYROIDAB" in the last 72 hours. Anemia Panel: No results for input(s): "VITAMINB12", "FOLATE", "FERRITIN", "TIBC", "IRON", "RETICCTPCT" in the last 72 hours. Urine analysis: No results found for: "COLORURINE", "APPEARANCEUR", "LABSPEC", "PHURINE", "GLUCOSEU", "HGBUR", "BILIRUBINUR", "KETONESUR", "PROTEINUR", "UROBILINOGEN", "NITRITE", "LEUKOCYTESUR"  Radiological Exams on Admission: I have personally reviewed images CT Angio Chest PE W and/or Wo Contrast Result Date: 11/14/2023 CLINICAL DATA:  Pulmonary embolism (PE) suspected, high prob. Shortness of breath EXAM: CT ANGIOGRAPHY CHEST WITH CONTRAST TECHNIQUE: Multidetector CT imaging of the chest was performed using the standard protocol during bolus administration of intravenous contrast. Multiplanar CT image reconstructions and MIPs were obtained to evaluate the vascular anatomy. RADIATION DOSE REDUCTION: This exam was performed according to the departmental dose-optimization program which includes automated exposure control, adjustment of the mA and/or kV according to patient size and/or use of iterative reconstruction technique. CONTRAST:  75mL OMNIPAQUE IOHEXOL 350 MG/ML SOLN COMPARISON:  06/20/2022 FINDINGS: Cardiovascular: No filling defects in the pulmonary arteries to suggest  pulmonary emboli. Heart is normal size. Aorta is normal caliber. Scattered coronary artery and aortic calcifications. Mediastinum/Nodes: No mediastinal, hilar, or axillary adenopathy. Trachea and esophagus are unremarkable. Thyroid unremarkable. Lungs/Pleura: Small to moderate bilateral pleural effusions. No confluent airspace opacities. Upper Abdomen: No acute findings Musculoskeletal: Chest wall soft tissues are unremarkable. No acute bony abnormality. Review of the MIP images confirms the above findings. IMPRESSION: No evidence of pulmonary embolus. Small to moderate bilateral pleural effusions. Scattered coronary artery disease. Aortic Atherosclerosis (ICD10-I70.0). Electronically Signed   By: Charlett Nose M.D.   On: 11/14/2023 18:40   DG Chest 2 View Result Date: 11/14/2023 CLINICAL DATA:  Shortness of breath. EXAM: CHEST - 2 VIEW COMPARISON:  04/02/2013 FINDINGS: The heart size and mediastinal contours are within normal limits. There is no evidence of pulmonary edema, consolidation, pneumothorax, nodule or pleural fluid. The visualized skeletal structures are unremarkable. IMPRESSION: No active cardiopulmonary disease. Electronically Signed   By: Irish Lack M.D.   On: 11/14/2023 15:01     EKG: My personal interpretation of EKG shows: Sinus tachycardia heart rate 107.    Assessment/Plan: Principal Problem:   CHF exacerbation (HCC) Active Problems:   Non-insulin dependent type 2 diabetes mellitus (HCC)   Essential hypertension   Hypothyroidism   PVD (peripheral vascular disease) (HCC)   Hyperlipidemia   History of CVA (cerebrovascular accident)   History of frequent PVCs   MGUS (monoclonal gammopathy of unknown significance)   Acute kidney injury superimposed on chronic kidney disease (HCC)   Gout   GAD (generalized anxiety disorder)   Acute exacerbation of CHF (congestive heart failure) (HCC)    Assessment and Plan: Acute systolic heart failure exacerbation Essential  hypertension -Patient presented to emergency department complaining of  shortness of orthopnea of dyspnea which has been progressively getting worse. - At presentation to ED patient is hemodynamically stable - CBC showing mild leukocytosis.  BMP showing slightly low sodium 133, elevated creatinine 1.44 and GFR 56 otherwise unremarkable findings.  Elevated D-dimer around 1.43 and elevated BNP around 1200. -Chest x-ray no cardiopulmonary process. - CTA chest rule out PE however it showed small to moderate bilateral pleural effusion, scattered coronary disease and aortic atherosclerosis. - Per chart review patient has been following up with outpatient cardiology Dr. Cristal Deer, patient has a nuclear stress test which showed EF 45%, due to concern for ischemic cardiomyopathy in the setting of history of severe peripheral vascular disease patient has been offered heart cath to assess CAD however patient declined. -Currently outpatient patient is is on losartan and Toprol-XL.  ARNI and MRA has not been started yet as cardiology was waiting to discuss with nephrology given patient has CKD stage II. - ED physician Dr. Maple Hudson has been discussed case with on-call cardiology recommended to give Lasix IV 48 mg with KCl 40 mEq cardiology will evaluate patient for formal consult. - Physical exam patient is euvolemic however concern for CHF in the setting of elevated BNP and bilateral pleural effusion also patient possibly has underlying nonischemic cardiomyopathy and will benefit from heart cath. -Plan to continue treat patient with IV Lasix for 20 mEq twice daily, strict I's/O, daily weight and monitor urine output - Holding losartan in the setting of borderline low blood pressure. - Continue Toprol-XL 50 mg daily. -Obtaining echocardiogram. -Continue cardia monitoring. -Will follow-up with cardiology formal recommendation.   Hyponatremia - Hyponatremia secondary to AKI and volume overload continue to treat  with IV diuretics.  Continue to monitor  Peripheral vascular disease -Continue Lipitor and Plavix.  History of CVA -Continue Lipitor and Plavix  History of frequent PVC -EKG showing sinus tachycardia heart rate 107.  Continue Toprol-XL 50 mg daily.  History of MGUS -Patient has been following up with outpatient oncology for further workup.  AKI superimposed CKD 2 -Elevated creatinine 1.4 and GFR 54.  Baseline creatinine around 1.1 and GFR above 60. -Prerenal acute kidney injury in the setting of CHF exacerbation. - Continue to monitor renal function.  Avoid nephrotoxic agents and renally adjust medications.  History of gout - Patient reported not taking febuxostat.  Generalized anxiety disorder -Continue Lexapro, Zoloft and Restoril at bedtime.  Non-insulin-dependent DM type II -At home patient is on metformin.  A1c 6.1 in 01/2021.  Checking A1c level.  Holding for in the setting of AKI - Continue low sliding scale SSI and at bedtime insulin as needed coverage with mealtime.  Hypothyroidism -Continue levothyroxine 200 mcg daily.  Chronic smoking of cigarettes - Patient currently smoking half pack to 1 pack cigarettes per day - Patient is declining nicotine patch.  Patient wants to use nicotine gum which has been ordered - Counseled  patient at the bedside for smoking cessation for long term.   DVT prophylaxis:  Lovenox Code Status:  Full Code Diet: Heart healthy carb modified diet Family Communication:   None present Disposition Plan: Continue to monitor for urine output, daily weight and improvement of shortness of breath. Consults: Cardiology and heart failure team Admission status:   Inpatient, Telemetry bed  Severity of Illness: The appropriate patient status for this patient is INPATIENT. Inpatient status is judged to be reasonable and necessary in order to provide the required intensity of service to ensure the patient's safety. The patient's presenting symptoms,  physical  exam findings, and initial radiographic and laboratory data in the context of their chronic comorbidities is felt to place them at high risk for further clinical deterioration. Furthermore, it is not anticipated that the patient will be medically stable for discharge from the hospital within 2 midnights of admission.   * I certify that at the point of admission it is my clinical judgment that the patient will require inpatient hospital care spanning beyond 2 midnights from the point of admission due to high intensity of service, high risk for further deterioration and high frequency of surveillance required.Marland Kitchen    Tereasa Coop, MD [facility]  How to contact the [facility] Attending or Consulting provider 7A - 7P or covering provider during after hours 7P -7A, for this patient.  Check the care team in [facility] and look for a) attending/consulting [facility] provider listed and b) the [facility] team listed Log into [email or web address] and use [facility]'s universal password to access. If you do not have the password, please contact the hospital operator. Locate the [facility] provider you are looking for under [facility] and page to a number that you can be directly reached. If you still have difficulty reaching the provider, please page the [facility] (Director on Call) for the Hospitalists listed on amion for assistance.  11/14/2023, 9:34 PM

## 2023-11-14 NOTE — ED Triage Notes (Signed)
C/o sob that is worse laying flat and abd pain and referred to ER for new onset of CHF. Denies extremity swelling. Denies cp.  Pt reports starting metoprolol 3 days ago with relief of sob.

## 2023-11-15 ENCOUNTER — Inpatient Hospital Stay (HOSPITAL_COMMUNITY): Payer: 59

## 2023-11-15 DIAGNOSIS — I5030 Unspecified diastolic (congestive) heart failure: Secondary | ICD-10-CM | POA: Diagnosis not present

## 2023-11-15 DIAGNOSIS — I509 Heart failure, unspecified: Secondary | ICD-10-CM | POA: Diagnosis not present

## 2023-11-15 DIAGNOSIS — I5043 Acute on chronic combined systolic (congestive) and diastolic (congestive) heart failure: Secondary | ICD-10-CM

## 2023-11-15 LAB — COMPREHENSIVE METABOLIC PANEL
ALT: 17 U/L (ref 0–44)
AST: 19 U/L (ref 15–41)
Albumin: 3.8 g/dL (ref 3.5–5.0)
Alkaline Phosphatase: 62 U/L (ref 38–126)
Anion gap: 9 (ref 5–15)
BUN: 27 mg/dL — ABNORMAL HIGH (ref 6–20)
CO2: 24 mmol/L (ref 22–32)
Calcium: 8.8 mg/dL — ABNORMAL LOW (ref 8.9–10.3)
Chloride: 103 mmol/L (ref 98–111)
Creatinine, Ser: 1.32 mg/dL — ABNORMAL HIGH (ref 0.61–1.24)
GFR, Estimated: >60 mL/min (ref >60)
Glucose, Bld: 131 mg/dL — ABNORMAL HIGH (ref 70–99)
Potassium: 3.5 mmol/L (ref 3.5–5.1)
Sodium: 136 mmol/L (ref 135–145)
Total Bilirubin: 0.6 mg/dL (ref 0.0–1.2)
Total Protein: 6.8 g/dL (ref 6.5–8.1)

## 2023-11-15 LAB — CBC
HCT: 38 % — ABNORMAL LOW (ref 39.0–52.0)
Hemoglobin: 12.4 g/dL — ABNORMAL LOW (ref 13.0–17.0)
MCH: 29.8 pg (ref 26.0–34.0)
MCHC: 32.6 g/dL (ref 30.0–36.0)
MCV: 91.3 fL (ref 80.0–100.0)
Platelets: 278 10*3/uL (ref 150–400)
RBC: 4.16 MIL/uL — ABNORMAL LOW (ref 4.22–5.81)
RDW: 14.5 % (ref 11.5–15.5)
WBC: 8.3 10*3/uL (ref 4.0–10.5)
nRBC: 0 % (ref 0.0–0.2)

## 2023-11-15 LAB — ECHOCARDIOGRAM COMPLETE
Area-P 1/2: 5.54 cm2
Calc EF: 9.8 %
Height: 73 in
S' Lateral: 5.2 cm
Single Plane A2C EF: 13.5 %
Single Plane A4C EF: 18.4 %
Weight: 3435.65 [oz_av]

## 2023-11-15 LAB — GLUCOSE, CAPILLARY
Glucose-Capillary: 131 mg/dL — ABNORMAL HIGH (ref 70–99)
Glucose-Capillary: 132 mg/dL — ABNORMAL HIGH (ref 70–99)
Glucose-Capillary: 152 mg/dL — ABNORMAL HIGH (ref 70–99)

## 2023-11-15 LAB — HIV ANTIBODY (ROUTINE TESTING W REFLEX): HIV Screen 4th Generation wRfx: NONREACTIVE

## 2023-11-15 MED ORDER — CLONAZEPAM 0.5 MG PO TABS
0.5000 mg | ORAL_TABLET | Freq: Every day | ORAL | Status: DC | PRN
  Administered 2023-11-15 – 2023-11-17 (×3): 0.5 mg via ORAL
  Filled 2023-11-15 (×3): qty 1

## 2023-11-15 MED ORDER — EMPAGLIFLOZIN 10 MG PO TABS
10.0000 mg | ORAL_TABLET | Freq: Every day | ORAL | Status: DC
  Filled 2023-11-15: qty 1

## 2023-11-15 MED ORDER — PERFLUTREN LIPID MICROSPHERE
1.0000 mL | INTRAVENOUS | Status: AC | PRN
  Administered 2023-11-15: 2 mL via INTRAVENOUS

## 2023-11-15 MED ORDER — FUROSEMIDE 40 MG PO TABS: 40.0000 mg | ORAL_TABLET | Freq: Every day | ORAL | Status: DC

## 2023-11-15 MED ORDER — HYDROCODONE-ACETAMINOPHEN 7.5-325 MG PO TABS
1.0000 | ORAL_TABLET | Freq: Four times a day (QID) | ORAL | Status: AC | PRN
  Administered 2023-11-15: 1 via ORAL
  Filled 2023-11-15: qty 1

## 2023-11-15 NOTE — TOC Initial Note (Signed)
Transition of Care [facility]) - Initial/Assessment Note    Patient Details  Name: Dustin Sampson MRN: [id number] Date of Birth: 1966-06-27  Transition of Care [facility]) CM/SW Contact:    Lanier Clam, RN Phone Number: 11/15/2023, 3:53 PM  Clinical Narrative:  d/c plan home.                 Expected Discharge Plan: Home/Self Care Barriers to Discharge: Continued Medical Work up   Patient Goals and CMS Choice Patient states their goals for this hospitalization and ongoing recovery are:: Home CMS Medicare.gov Compare Post Acute Care list provided to:: Patient Choice offered to / list presented to : Patient [place] ownership interest in [facility].provided to:: Patient    Expected Discharge Plan and Services                                              Prior Living Arrangements/Services                       Activities of Daily Living   ADL Screening (condition at time of admission) Independently performs ADLs?: Yes (appropriate for developmental age) Is the patient deaf or have difficulty hearing?: Yes Does the patient have difficulty seeing, even when wearing glasses/contacts?: Yes Does the patient have difficulty concentrating, remembering, or making decisions?: Yes  Permission Sought/Granted                  Emotional Assessment              Admission diagnosis:  Acute exacerbation of CHF (congestive heart failure) (HCC) [I50.9] Congestive heart failure, unspecified HF chronicity, unspecified heart failure type [facility]) [I50.9] Patient Active Problem List   Diagnosis Date Noted   Diastolic congestive heart failure (HCC) 11/15/2023   CHF exacerbation (HCC) 11/14/2023   Hyperlipidemia 11/14/2023   History of CVA (cerebrovascular accident) 11/14/2023   History of frequent PVCs 11/14/2023   MGUS (monoclonal gammopathy of unknown significance) 11/14/2023   Acute kidney injury superimposed on chronic kidney disease (HCC) 11/14/2023   Gout  11/14/2023   GAD (generalized anxiety disorder) 11/14/2023   Acute exacerbation of CHF (congestive heart failure) (HCC) 11/14/2023   PVD (peripheral vascular disease) (HCC) 05/03/2021   Presumed CAD 05/03/2021   Ischemic cardiomyopathy 05/03/2021   Non-insulin dependent type 2 diabetes mellitus (HCC) 03/26/2020   Essential hypertension 03/26/2020   Hypothyroidism 03/26/2020   Chronic pain of left ankle 07/11/2019   Strain of left Achilles tendon 07/11/2019   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 [place] [address] Phone: [phone] Fax: [phone]     Social Drivers of Health (SDOH) Social History: SDOH Screenings   Food Insecurity: No Food Insecurity (11/14/2023)  Housing: Low Risk  (11/14/2023)  Transportation Needs: No Transportation Needs (11/14/2023)  Utilities: Not At Risk (11/14/2023)  Social Connections: Moderately Integrated (11/14/2023)  Tobacco Use: High Risk (11/14/2023)   SDOH Interventions:     Readmission Risk Interventions     No data to display

## 2023-11-15 NOTE — Consult Note (Addendum)
Cardiology Consultation   Patient ID: Dustin Sampson MRN: [id number]; DOB: Apr 02, 1966  Admit date: 11/14/2023 Date of Consult: 11/15/2023  PCP:  Jarrett Soho, PA-C   [facility] Providers Cardiologist:  Jodelle Red, MD   {  Patient Profile:   Dustin Sampson is a 58 y.o. male with a hx of presumed CAD and ischemic cardiomyopathy, PAD with claudication status post stenting of bilateral SFAs in November 2021, 10% PVC burden on monitor in January 2023, diffuse large B-cell lymphoma, CVA, ataxia, hypertension, hyperlipidemia, type 2 diabetes, hypothyroidism, MGUS, nicotine dependence 1 pack/day, CKD who is being seen 11/15/2023 for the evaluation of CHF exacerbation at the request of Dr. Lowell Guitar.  History of Present Illness:   Mr. Bradford patient has presumed history of CAD and possibly ischemic cardiomyopathy with an abnormal stress test in October 2021 while getting worked up for a peripheral angiogram.  EF at that time was around 45%, stress test showed a inferior/inferoseptal perfusion defect suggestive of infarct with peri-infarct ischemia.  He also has significant PVC burden and started on Toprol-XL for GDMT.  Patient was offered cardiac catheterization but given his lack of symptoms and mildly reduced EF he declined this at the time.  Reportedly he had an echocardiogram in September 2023 that showed EF 50 to 55% at [facility].  Currently patient admitted for CHF exacerbation.  He reports being overall well compensated and does not require oral diuretics.  However suddenly in the past 1 to 2 weeks he has noted progressive shortness of breath, orthopnea, but denies any lower extremity edema.  He had gone to the urgent care who recommended to be seen in the ED but he had refused this at the time.  Eventually his wife convinced him to come to the emergency room yesterday but at that time he started to feel significantly better suddenly.  Yesterday he was started on IV Lasix 80 mg for 1 dose  with improvement in symptoms.  At this time he feels that he is back at his baseline and could possibly discharge.  Also reports not being compliant with all of his home medications but still takes his metoprolol and antidepressants.  Otherwise no other significant complaints such as chest pain, palpitations, syncope, falls.  Denies any weight gain.  He does describe some nonspecific abdominal pain that also spontaneously goes away.  CTA shows no PE but with small to moderate bilateral pleural effusions with scattered CAD and aortic atherosclerosis.  BNP over 1100.  A1c 6.0.  Creatinine 1.32.  Hemoglobin 12.4.  Past Medical History:  Diagnosis Date   Anxiety    Arthritis    Diabetes mellitus without complication (HCC)    Gout    Hyperlipemia    Hypertension    Hypothyroidism    Insomnia    Pneumonia    Stroke [facility])    Thyroid disease     Past Surgical History:  Procedure Laterality Date   ABDOMINAL AORTOGRAM W/LOWER EXTREMITY Bilateral 08/21/2020   Procedure: ABDOMINAL AORTOGRAM W/LOWER EXTREMITY;  Surgeon: Sherren Kerns, MD;  Location: [facility] INVASIVE CV LAB;  Service: Cardiovascular;  Laterality: Bilateral;   ABDOMINAL AORTOGRAM W/LOWER EXTREMITY N/A 09/04/2020   Procedure: ABDOMINAL AORTOGRAM W/LOWER EXTREMITY;  Surgeon: Sherren Kerns, MD;  Location: [facility] INVASIVE CV LAB;  Service: Cardiovascular;  Laterality: N/A;   COLONOSCOPY     x2   FEMORAL ARTERY EXPLORATION Left 09/04/2020   Procedure: FEMORAL ARTERY EXPLORATION;  Surgeon: Chuck Hint, MD;  Location: [facility] OR;  Service:  Vascular;  Laterality: Left;   NASAL SINUS SURGERY     PERIPHERAL VASCULAR INTERVENTION Left 08/21/2020   Procedure: PERIPHERAL VASCULAR INTERVENTION;  Surgeon: Sherren Kerns, MD;  Location: [facility] INVASIVE CV LAB;  Service: Cardiovascular;  Laterality: Left;  sfa stent x 3   PERIPHERAL VASCULAR INTERVENTION  09/04/2020   Procedure: PERIPHERAL VASCULAR INTERVENTION;  Surgeon: Sherren Kerns, MD;   Location: [facility] INVASIVE CV LAB;  Service: Cardiovascular;;  Right SFA   SHOULDER ARTHROSCOPY WITH ROTATOR CUFF REPAIR AND SUBACROMIAL DECOMPRESSION Left 02/04/2021   Procedure: SHOULDER ARTHROSCOPY WITH ROTATOR CUFF REPAIR AND SUBACROMIAL DECOMPRESSION;  Surgeon: Jones Broom, MD;  Location: [facility] ORS;  Service: Orthopedics;  Laterality: Left;   WISDOM TOOTH EXTRACTION     WRIST SURGERY     bilat    Inpatient Medications: Scheduled Meds:  atorvastatin  80 mg Oral QPM   clopidogrel  75 mg Oral Daily   enoxaparin (LOVENOX) injection  40 mg Subcutaneous Q24H   escitalopram  5 mg Oral Daily   furosemide  20 mg Intravenous BID   insulin aspart  0-5 Units Subcutaneous QHS   insulin aspart  0-6 Units Subcutaneous TID WC   levothyroxine  200 mcg Oral QAC breakfast   metoprolol succinate  50 mg Oral Daily   sertraline  25 mg Oral Daily   sodium chloride flush  3 mL Intravenous Q12H   temazepam  30 mg Oral QHS   Continuous Infusions:  sodium chloride     PRN Meds: sodium chloride, acetaminophen **OR** acetaminophen, nicotine polacrilex, ondansetron **OR** ondansetron (ZOFRAN) IV, sodium chloride flush  Allergies:    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; "it makes me bleed" and caused abdominal pain -Pt can take in small amounts(??)    Avelox [Moxifloxacin Hcl In Nacl] Nausea And Vomiting and Other (See Comments)    "made me really sick"   Colchicine Diarrhea   Gabapentin Other (See Comments)    Affected balance- no longer takes this   Latex Other (See Comments)    Irritates the skin   Nsaids Other (See Comments)    Avoids due to kidney issues   Penicillins Other (See Comments)    Severe GI upset- occurred in childhood    Social History:   Social History   Socioeconomic History   Marital status: Married    Spouse name: Not on file   Number of children: 2   Years of education: BS   Highest education  level: Bachelor's degree (e.g., BA, AB, BS)  Occupational History   Occupation: Nutritional therapist  Tobacco Use   Smoking status: Every Day    Current packs/day: 0.50    Types: Cigarettes    Passive exposure: Never   Smokeless tobacco: Former   Tobacco comments:    1/2 PACK A DAY  Vaping Use   Vaping status: Never Used  Substance and Sexual Activity   Alcohol use: Not Currently   Drug use: No   Sexual activity: Not on file  Other Topics Concern   Not on file  Social History Narrative   Married, lives with wife and two teenage children. He and his sister alternate with their parents living with them every other month. His parents are in their upper 80's. He drinks 4-5 cups of coffee a day and 2-3 sodas or glasses of tea. He does not get any regular exercise. Right handed   One story home  Social Drivers of Corporate investment banker Strain: Not on file  Food Insecurity: No Food Insecurity (11/14/2023)   Hunger Vital Sign    Worried About Running Out of Food in the Last Year: Never true    Ran Out of Food in the Last Year: Never true  Transportation Needs: No Transportation Needs (11/14/2023)   PRAPARE - Administrator, Civil Service (Medical): No    Lack of Transportation (Non-Medical): No  Physical Activity: Not on file  Stress: Not on file  Social Connections: Moderately Integrated (11/14/2023)   Social Connection and Isolation Panel [NHANES]    Frequency of Communication with Friends and Family: More than three times a week    Frequency of Social Gatherings with Friends and Family: Twice a week    Attends Religious Services: 1 to 4 times per year    Active Member of [employer] or Organizations: No    Attends Banker Meetings: Never    Marital Status: Married  Catering manager Violence: Not At Risk (11/14/2023)   Humiliation, Afraid, Rape, and Kick questionnaire    Fear of Current or Ex-Partner: No    Emotionally Abused: No    Physically Abused: No     Sexually Abused: No    Family History:   Family History  Problem Relation Age of Onset   Colon cancer Father    Lung cancer Father    Atrial fibrillation Mother    Autoimmune disease Sister    Healthy Daughter    Healthy Son      ROS:  Please see the history of present illness.  All other ROS reviewed and negative.     Physical Exam/Data:   Vitals:   11/14/23 2109 11/15/23 0117 11/15/23 0357 11/15/23 0500  BP: (!) 102/90 (!) 117/97 120/71   Pulse: 93 97 62   Resp: 17 18 19    Temp: (!) 97.5 F (36.4 C) 98.2 F (36.8 C) 98.1 F (36.7 C)   TempSrc: Oral Oral Oral   SpO2: 100% 92% 96%   Weight:    97.4 kg  Height:        Intake/Output Summary (Last 24 hours) at 11/15/2023 0847 Last data filed at 11/15/2023 0839 Gross per 24 hour  Intake 480 ml  Output 2025 ml  Net -1545 ml      11/15/2023    5:00 AM 11/14/2023    2:11 PM 05/04/2023    2:37 PM  Last 3 Weights  Weight (lbs) 214 lb 11.7 oz 216 lb 0.8 oz 218 lb  Weight (kg) 97.4 kg 98 kg 98.884 kg     Body mass index is 28.33 kg/m.  General:  Well nourished, well developed, in no acute distress HEENT: normal Neck: mild JVD Vascular: No carotid bruits; Distal pulses 2+ bilaterally Cardiac:  normal S1, S2; RRR; no murmur  Lungs:  clear to auscultation bilaterally, somewhat decreased Abd: soft, nontender, no hepatomegaly  Ext: no edema Musculoskeletal:  No deformities, BUE and BLE strength normal and equal Skin: warm and dry  Neuro:  CNs 2-12 intact, no focal abnormalities noted Psych:  Normal affect   EKG:  The EKG was personally reviewed and demonstrates: Sinus tachycardia, heart rate 107.  No acute ST-T wave changes. Telemetry:  Telemetry was personally reviewed and demonstrates: Sinus rhythm borderline tachycardic in the low 100s.  Frequent PVCs. Quadgeminy and bigeminy   Relevant CV Studies:  Laboratory Data:  High Sensitivity Troponin:  No results for input(s): "TROPONINIHS" in the  last 720 hours.    Chemistry Recent Labs  Lab 11/14/23 1419 11/15/23 0456  NA 133* 136  K 3.7 3.5  CL 101 103  CO2 22 24  GLUCOSE 166* 131*  BUN 24* 27*  CREATININE 1.44* 1.32*  CALCIUM 9.3 8.8*  GFRNONAA 56* >60  ANIONGAP 10 9    Recent Labs  Lab 11/15/23 0456  PROT 6.8  ALBUMIN 3.8  AST 19  ALT 17  ALKPHOS 62  BILITOT 0.6   Lipids No results for input(s): "CHOL", "TRIG", "HDL", "LABVLDL", "LDLCALC", "CHOLHDL" in the last 168 hours.  Hematology Recent Labs  Lab 11/14/23 1419 11/15/23 0456  WBC 10.6* 8.3  RBC 4.40 4.16*  HGB 13.0 12.4*  HCT 39.3 38.0*  MCV 89.3 91.3  MCH 29.5 29.8  MCHC 33.1 32.6  RDW 14.4 14.5  PLT 340 278   Thyroid No results for input(s): "TSH", "FREET4" in the last 168 hours.  BNP Recent Labs  Lab 11/14/23 1419  BNP 1,179.6*    DDimer  Recent Labs  Lab 11/14/23 1627  DDIMER 1.43*     Radiology/Studies:  CT Angio Chest PE W and/or Wo Contrast Result Date: 11/14/2023 CLINICAL DATA:  Pulmonary embolism (PE) suspected, high prob. Shortness of breath EXAM: CT ANGIOGRAPHY CHEST WITH CONTRAST TECHNIQUE: Multidetector CT imaging of the chest was performed using the standard protocol during bolus administration of intravenous contrast. Multiplanar CT image reconstructions and MIPs were obtained to evaluate the vascular anatomy. RADIATION DOSE REDUCTION: This exam was performed according to the departmental dose-optimization program which includes automated exposure control, adjustment of the mA and/or kV according to patient size and/or use of iterative reconstruction technique. CONTRAST:  75mL OMNIPAQUE IOHEXOL 350 MG/ML SOLN COMPARISON:  06/20/2022 FINDINGS: Cardiovascular: No filling defects in the pulmonary arteries to suggest pulmonary emboli. Heart is normal size. Aorta is normal caliber. Scattered coronary artery and aortic calcifications. Mediastinum/Nodes: No mediastinal, hilar, or axillary adenopathy. Trachea and esophagus are unremarkable. Thyroid  unremarkable. Lungs/Pleura: Small to moderate bilateral pleural effusions. No confluent airspace opacities. Upper Abdomen: No acute findings Musculoskeletal: Chest wall soft tissues are unremarkable. No acute bony abnormality. Review of the MIP images confirms the above findings. IMPRESSION: No evidence of pulmonary embolus. Small to moderate bilateral pleural effusions. Scattered coronary artery disease. Aortic Atherosclerosis (ICD10-I70.0). Electronically Signed   By: Charlett Nose M.D.   On: 11/14/2023 18:40   DG Chest 2 View Result Date: 11/14/2023 CLINICAL DATA:  Shortness of breath. EXAM: CHEST - 2 VIEW COMPARISON:  04/02/2013 FINDINGS: The heart size and mediastinal contours are within normal limits. There is no evidence of pulmonary edema, consolidation, pneumothorax, nodule or pleural fluid. The visualized skeletal structures are unremarkable. IMPRESSION: No active cardiopulmonary disease. Electronically Signed   By: Irish Lack M.D.   On: 11/14/2023 15:01     Assessment and Plan:   CAD with presumed ischemic cardiomyopathy Previous abnormal nuclear stress test in 2021 that showed inferior/inferoseptal perfusion defect.  Cardiac catheterization deferred by patient at that time. EF 45%. Reportedly EF normalized in September 2023 though actual report is not viewable.  He has had sudden increase in DOE and orthopnea but feels back to baseline after 1 dose of IV Lasix yesterday, -2 L.  Mild to moderate pleural effusion noted on CT. Looks generally euvolemic.  Discussed with MD, he is amenable for cath, will plan for tomorrow.    Check echocardiogram, suspect he will have a drop in EF. Will hold for now but tomorrow's plan will be  the following below: GDMT: start Jardiance 10 mg, transition to p.o. Lasix 40 mg daily versus as needed, Jardiance has mild diuretic effect so he may not need Lasix daily.  On Toprol-XL 50 mg.  Reports being noncompliant with his other medications (because of his SOB).   PTA  was on losartan 100mg , but so may need to restart at lower dose. Pressures soft, so will hold off for now.  Appears to be at baseline weight compared to outpatient office numbers around 215.  Currently 214.  PVCs Asymptomatic, he has frequent PVCs with previous monitor in 2023 indicating 10% burden.  This might be contributing to his cardiomyopathy. Currently on Toprol XL 50 mg, may need to increase dose to suppress ectopy better.  PAD status post stenting to bilateral SFAs 08/2020 Prior CVA On DAPT with aspirin and Plavix, high intensity statin  Diabetes A1c 6.0%.  CKD Creatinine elevated from baseline but downtrending with diuresis.  Currently 1.32.    Nicotine dependence Still smokes about 1 pack/day.  Counseled on cessation  Informed Consent   Shared Decision Making/Informed Consent  The risks [stroke (1 in 1000), death (1 in 1000), kidney failure [usually temporary] (1 in 500), bleeding (1 in 200), allergic reaction [possibly serious] (1 in 200)], benefits (diagnostic support and management of coronary artery disease) and alternatives of a cardiac catheterization were discussed in detail with Mr. Stachowicz and he is willing to proceed.   Risk Assessment/Risk Scores:     New York Heart Association (NYHA) Functional Class NYHA Class III        For questions or updates, please contact [facility] Please consult [email or web address] for contact info under    Signed, Abagail Kitchens, PA-C  11/15/2023 8:47 AM

## 2023-11-15 NOTE — Progress Notes (Signed)
  Echocardiogram 2D Echocardiogram has been performed.  Janalyn Harder 11/15/2023, 2:35 PM

## 2023-11-15 NOTE — Progress Notes (Signed)
Mobility Specialist - Progress Note   11/15/23 1322  Mobility  Activity Ambulated with assistance in hallway  Level of Assistance Contact guard assist, steadying assist  Assistive Device Front wheel walker  Distance Ambulated (ft) 250 ft  Activity Response Tolerated well  Mobility Referral Yes  Mobility visit 1 Mobility  Mobility Specialist Start Time (ACUTE ONLY) 1312  Mobility Specialist Stop Time (ACUTE ONLY) 1321  Mobility Specialist Time Calculation (min) (ACUTE ONLY) 9 min   Pt received EOB and agreeable to mobility. Pt was CG d/t unsteadiness. No complaints during session.  Pt to EOB for meal after session with all needs met.    [facility]

## 2023-11-15 NOTE — Progress Notes (Addendum)
PROGRESS NOTE    Dustin Sampson  UUV:[id number] DOB: 14-Mar-1966 DOA: 11/14/2023 PCP: Jarrett Soho, PA-C  Chief Complaint  Patient presents with   Shortness of Breath    Brief Narrative:   Dustin Sampson is Dustin Sampson 58 y.o. 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    He's been admitted with HF exacerbation.   Plan for cath 11/16/2023.  Assessment & Plan:   Principal Problem:   CHF exacerbation (HCC) Active Problems:   Non-insulin dependent type 2 diabetes mellitus (HCC)   Essential hypertension   Hypothyroidism   PVD (peripheral vascular disease) (HCC)   Hyperlipidemia   History of CVA (cerebrovascular accident)   History of frequent PVCs   MGUS (monoclonal gammopathy of unknown significance)   Acute kidney injury superimposed on chronic kidney disease (HCC)   Gout   GAD (generalized anxiety disorder)   Acute exacerbation of CHF (congestive heart failure) (HCC)   Diastolic congestive heart failure (HCC)  Acute systolic heart failure exacerbation Coronary Artery Disease - CT PE protocol negative for PE, notable for small to moderate bilateral effusions -- echo with EF 20-25% -- intermediate risk stress test 08/2020  -- plan for catheterization with cardiology on 11/16/23 -- currently appears more euvolemic, GDMT per cardiology    Hyponatremia - monitor with diuresis   Peripheral vascular disease -Continue Lipitor and Plavix.   History of CVA -Continue Lipitor and Plavix   History of frequent PVC -Continue Toprol-XL 50 mg daily.   History of MGUS -Patient has been following up with outpatient oncology for further  workup.   AKI superimposed CKD 2 -creatinine 1.4 on presentation.  Baseline creatinine around 1.1  - trend   History of gout - Patient reported not taking febuxostat.   Generalized anxiety disorder -Continue Lexapro, Zoloft and Restoril at bedtime.   Non-insulin-dependent DM type II -At home patient is on metformin.   -- A1c 6.0 - Continue low sliding scale SSI and at bedtime insulin as needed coverage with mealtime.   Hypothyroidism -Continue levothyroxine 200 mcg daily. -- follow TSH   Chronic smoking of cigarettes - Patient currently smoking half pack to 1 pack cigarettes per day - nicotine gum  - encourage cessation   Ataxia Known hx for several years, undergoing neuro w/u     DVT prophylaxis: lovenox Code Status: full Family Communication: none Disposition:   Status is: Inpatient Remains inpatient appropriate because: need for continued inpatient care   Consultants:  cardiology  Procedures:   Echo IMPRESSIONS     1. Left ventricular ejection fraction, by estimation, is 20 to 25%. The  left ventricle has severely decreased function. The left ventricle  demonstrates global hypokinesis. The left ventricular internal cavity size  was moderately dilated. Left  ventricular diastolic parameters are indeterminate.   2. Right ventricular systolic function is normal. The right ventricular  size is normal. Tricuspid regurgitation signal is inadequate for assessing  PA pressure.   3. The mitral valve is normal in structure. Trivial mitral valve  regurgitation. No evidence of mitral stenosis.   4. The aortic valve was not well visualized. Aortic valve regurgitation  is not visualized. No aortic stenosis is present.   5. The inferior vena cava is  normal in size with greater than 50%  respiratory variability, suggesting right atrial pressure of 3 mmHg.    Antimicrobials:  Anti-infectives (From admission, onward)    None       Subjective: No new  complaints Feels better  Objective: Vitals:   11/15/23 0357 11/15/23 0500 11/15/23 0937 11/15/23 1031  BP: 120/71  112/77 112/77  Pulse: 62  93 93  Resp: 19  19   Temp: 98.1 F (36.7 C)  98.8 F (37.1 C)   TempSrc: Oral  Oral   SpO2: 96%  97%   Weight:  97.4 kg    Height:        Intake/Output Summary (Last 24 hours) at 11/15/2023 1616 Last data filed at 11/15/2023 0839 Gross per 24 hour  Intake 480 ml  Output 2025 ml  Net -1545 ml   Filed Weights   11/14/23 1411 11/15/23 0500  Weight: 98 kg 97.4 kg    Examination:  General exam: Appears calm and comfortable  Respiratory system: Clear to auscultation. Respiratory effort normal. Cardiovascular system: RRR Gastrointestinal system: Abdomen is nondistended, soft and nontender.  Central nervous system: Alert and oriented. No focal neurological deficits. Extremities: no LEE    Data Reviewed: I have personally reviewed following labs and imaging studies  CBC: Recent Labs  Lab 11/14/23 1419 11/15/23 0456  WBC 10.6* 8.3  HGB 13.0 12.4*  HCT 39.3 38.0*  MCV 89.3 91.3  PLT 340 278    Basic Metabolic Panel: Recent Labs  Lab 11/14/23 1419 11/15/23 0456  NA 133* 136  K 3.7 3.5  CL 101 103  CO2 22 24  GLUCOSE 166* 131*  BUN 24* 27*  CREATININE 1.44* 1.32*  CALCIUM 9.3 8.8*    GFR: Estimated Creatinine Clearance: 75 mL/min (Derya Dettmann) (by C-G formula based on SCr of 1.32 mg/dL (H)).  Liver Function Tests: Recent Labs  Lab 11/15/23 0456  AST 19  ALT 17  ALKPHOS 62  BILITOT 0.6  PROT 6.8  ALBUMIN 3.8    CBG: Recent Labs  Lab 11/14/23 2102 11/15/23 0743  GLUCAP 186* 131*     No results found for this or any previous visit (from the past 240 hours).       Radiology Studies: ECHOCARDIOGRAM COMPLETE Result Date: 11/15/2023    ECHOCARDIOGRAM REPORT   Patient Name:   Dustin Sampson  Date of Exam: 11/15/2023 Medical Rec #:  [id number]  Height:       73.0 in Accession #:    [id number] Weight:       214.7 lb  Date of Birth:  08-Oct-1966 BSA:          2.218 m Patient Age:    57 years   BP:           120/71 mmHg Patient Gender: M          HR:           98 bpm. Exam Location:  Inpatient Procedure: 2D Echo, Cardiac Doppler, Color Doppler and Intracardiac            Opacification Agent REPORT CONTAINS CRITICAL RESULT Indications:    I50.40* Unspecified combined systolic (congestive) and diastolic                 (congestive) heart failure  History:        Patient has prior history of Echocardiogram examinations, most                 recent 09/21/2020. CHF and  Cardiomyopathy, CAD, Stroke; Risk                 Factors:Hypertension and Diabetes.  Sonographer:    Sheralyn Boatman RDCS Referring Phys: [id number] SUBRINA SUNDIL IMPRESSIONS  1. Left ventricular ejection fraction, by estimation, is 20 to 25%. The left ventricle has severely decreased function. The left ventricle demonstrates global hypokinesis. The left ventricular internal cavity size was moderately dilated. Left ventricular diastolic parameters are indeterminate.  2. Right ventricular systolic function is normal. The right ventricular size is normal. Tricuspid regurgitation signal is inadequate for assessing PA pressure.  3. The mitral valve is normal in structure. Trivial mitral valve regurgitation. No evidence of mitral stenosis.  4. The aortic valve was not well visualized. Aortic valve regurgitation is not visualized. No aortic stenosis is present.  5. The inferior vena cava is normal in size with greater than 50% respiratory variability, suggesting right atrial pressure of 3 mmHg. FINDINGS  Left Ventricle: Left ventricular ejection fraction, by estimation, is 20 to 25%. The left ventricle has severely decreased function. The left ventricle demonstrates global hypokinesis. Definity contrast agent was given IV to delineate the left ventricular endocardial borders. The left ventricular internal cavity size was moderately dilated. There is no left ventricular hypertrophy.  Left ventricular diastolic parameters are indeterminate. Right Ventricle: The right ventricular size is normal. No increase in right ventricular wall thickness. Right ventricular systolic function is normal. Tricuspid regurgitation signal is inadequate for assessing PA pressure. Left Atrium: Left atrial size was normal in size. Right Atrium: Right atrial size was normal in size. Pericardium: There is no evidence of pericardial effusion. Mitral Valve: The mitral valve is normal in structure. Trivial mitral valve regurgitation. No evidence of mitral valve stenosis. Tricuspid Valve: The tricuspid valve is normal in structure. Tricuspid valve regurgitation is trivial. Aortic Valve: The aortic valve was not well visualized. Aortic valve regurgitation is not visualized. No aortic stenosis is present. Pulmonic Valve: The pulmonic valve was not well visualized. Pulmonic valve regurgitation is trivial. Aorta: The aortic root and ascending aorta are structurally normal, with no evidence of dilitation. Venous: The inferior vena cava is normal in size with greater than 50% respiratory variability, suggesting right atrial pressure of 3 mmHg. IAS/Shunts: The interatrial septum was not well visualized.  LEFT VENTRICLE PLAX 2D LVIDd:         6.40 cm      Diastology LVIDs:         5.20 cm      LV e' medial:    4.13 cm/s LV PW:         1.00 cm      LV E/e' medial:  26.2 LV IVS:        0.90 cm      LV e' lateral:   6.42 cm/s LVOT diam:     2.10 cm      LV E/e' lateral: 16.8 LV SV:         47 LV SV Index:   21 LVOT Area:     3.46 cm  LV Volumes (MOD) LV vol d, MOD A2C: 178.0 ml LV vol d, MOD A4C: 136.0 ml LV vol s, MOD A2C: 154.0 ml LV vol s, MOD A4C: 111.0 ml LV SV MOD A2C:     24.0 ml LV SV MOD A4C:     136.0 ml LV SV MOD BP:      15.3 ml RIGHT VENTRICLE  IVC RV S prime:     12.30 cm/s  IVC diam: 2.00 cm TAPSE (M-mode): 2.1 cm LEFT ATRIUM             Index        RIGHT ATRIUM           Index LA diam:        4.90 cm 2.21  cm/m   RA Area:     13.50 cm LA Vol (A2C):   92.8 ml 41.85 ml/m  RA Volume:   33.60 ml  15.15 ml/m LA Vol (A4C):   33.1 ml 14.93 ml/m LA Biplane Vol: 54.9 ml 24.76 ml/m  AORTIC VALVE LVOT Vmax:   89.70 cm/s LVOT Vmean:  55.800 cm/s LVOT VTI:    0.135 m  AORTA Ao Root diam: 3.10 cm Ao Asc diam:  3.20 cm MITRAL VALVE MV Area (PHT): 5.54 cm     SHUNTS MV Decel Time: 137 msec     Systemic VTI:  0.14 m MV E velocity: 108.00 cm/s  Systemic Diam: 2.10 cm Epifanio Lesches MD Electronically signed by Epifanio Lesches MD Signature Date/Time: 11/15/2023/3:06:26 PM    Final    CT Angio Chest PE W and/or Wo Contrast Result Date: 11/14/2023 CLINICAL DATA:  Pulmonary embolism (PE) suspected, high prob. Shortness of breath EXAM: CT ANGIOGRAPHY CHEST WITH CONTRAST TECHNIQUE: Multidetector CT imaging of the chest was performed using the standard protocol during bolus administration of intravenous contrast. Multiplanar CT image reconstructions and MIPs were obtained to evaluate the vascular anatomy. RADIATION DOSE REDUCTION: This exam was performed according to the departmental dose-optimization program which includes automated exposure control, adjustment of the mA and/or kV according to patient size and/or use of iterative reconstruction technique. CONTRAST:  75mL OMNIPAQUE IOHEXOL 350 MG/ML SOLN COMPARISON:  06/20/2022 FINDINGS: Cardiovascular: No filling defects in the pulmonary arteries to suggest pulmonary emboli. Heart is normal size. Aorta is normal caliber. Scattered coronary artery and aortic calcifications. Mediastinum/Nodes: No mediastinal, hilar, or axillary adenopathy. Trachea and esophagus are unremarkable. Thyroid unremarkable. Lungs/Pleura: Small to moderate bilateral pleural effusions. No confluent airspace opacities. Upper Abdomen: No acute findings Musculoskeletal: Chest wall soft tissues are unremarkable. No acute bony abnormality. Review of the MIP images confirms the above findings. IMPRESSION:  No evidence of pulmonary embolus. Small to moderate bilateral pleural effusions. Scattered coronary artery disease. Aortic Atherosclerosis (ICD10-I70.0). Electronically Signed   By: Charlett Nose M.D.   On: 11/14/2023 18:40   DG Chest 2 View Result Date: 11/14/2023 CLINICAL DATA:  Shortness of breath. EXAM: CHEST - 2 VIEW COMPARISON:  04/02/2013 FINDINGS: The heart size and mediastinal contours are within normal limits. There is no evidence of pulmonary edema, consolidation, pneumothorax, nodule or pleural fluid. The visualized skeletal structures are unremarkable. IMPRESSION: No active cardiopulmonary disease. Electronically Signed   By: Irish Lack M.D.   On: 11/14/2023 15:01        Scheduled Meds:  atorvastatin  80 mg Oral QPM   clopidogrel  75 mg Oral Daily   enoxaparin (LOVENOX) injection  40 mg Subcutaneous Q24H   escitalopram  5 mg Oral Daily   insulin aspart  0-5 Units Subcutaneous QHS   insulin aspart  0-6 Units Subcutaneous TID WC   levothyroxine  200 mcg Oral QAC breakfast   metoprolol succinate  50 mg Oral Daily   sertraline  25 mg Oral Daily   sodium chloride flush  3 mL Intravenous Q12H   temazepam  30 mg Oral QHS   Continuous  Infusions:  sodium chloride       LOS: 1 day    Time spent: over 30 min    Lacretia Nicks, MD [facility]   To contact the attending provider between 7A-7P or the covering provider during after hours 7P-7A, please log into the web site [email or web address] and access using universal [place] password for that web site. If you do not have the password, please call the hospital operator.  11/15/2023, 4:16 PM

## 2023-11-15 NOTE — Progress Notes (Signed)
ASPIRIN ALLERGY!!!  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  Interventionalists cardiologist informed.  Will update tomorrow's progress note to reflect this and update allergy list.

## 2023-11-16 ENCOUNTER — Encounter (HOSPITAL_COMMUNITY)
Admission: EM | Disposition: A | Discharge status: Home / Self Care | Payer: Self-pay | Source: Home / Self Care | Attending: Family Medicine

## 2023-11-16 DIAGNOSIS — I5043 Acute on chronic combined systolic (congestive) and diastolic (congestive) heart failure: Secondary | ICD-10-CM

## 2023-11-16 DIAGNOSIS — I2510 Atherosclerotic heart disease of native coronary artery without angina pectoris: Secondary | ICD-10-CM

## 2023-11-16 DIAGNOSIS — I509 Heart failure, unspecified: Secondary | ICD-10-CM | POA: Diagnosis not present

## 2023-11-16 DIAGNOSIS — I5042 Chronic combined systolic (congestive) and diastolic (congestive) heart failure: Secondary | ICD-10-CM

## 2023-11-16 DIAGNOSIS — I5022 Chronic systolic (congestive) heart failure: Secondary | ICD-10-CM

## 2023-11-16 HISTORY — PX: RIGHT/LEFT HEART CATH AND CORONARY ANGIOGRAPHY: CATH118266

## 2023-11-16 LAB — POCT I-STAT 7, (LYTES, BLD GAS, ICA,H+H)
Acid-base deficit: 7 mmol/L — ABNORMAL HIGH (ref 0.0–2.0)
Bicarbonate: 19.8 mmol/L — ABNORMAL LOW (ref 20.0–28.0)
Calcium, Ion: 1.12 mmol/L — ABNORMAL LOW (ref 1.15–1.40)
HCT: 37 % — ABNORMAL LOW (ref 39.0–52.0)
Hemoglobin: 12.6 g/dL — ABNORMAL LOW (ref 13.0–17.0)
O2 Saturation: 93 %
Potassium: 3.4 mmol/L — ABNORMAL LOW (ref 3.5–5.1)
Sodium: 120 mmol/L — ABNORMAL LOW (ref 135–145)
TCO2: 21 mmol/L — ABNORMAL LOW (ref 22–32)
pCO2 arterial: 42.6 mm[Hg] (ref 32–48)
pH, Arterial: 7.275 — ABNORMAL LOW (ref 7.35–7.45)
pO2, Arterial: 76 mm[Hg] — ABNORMAL LOW (ref 83–108)

## 2023-11-16 LAB — GLUCOSE, CAPILLARY
Glucose-Capillary: 127 mg/dL — ABNORMAL HIGH (ref 70–99)
Glucose-Capillary: 139 mg/dL — ABNORMAL HIGH (ref 70–99)
Glucose-Capillary: 144 mg/dL — ABNORMAL HIGH (ref 70–99)
Glucose-Capillary: 119 mg/dL — ABNORMAL HIGH (ref 70–99)

## 2023-11-16 LAB — POCT I-STAT EG7
Acid-Base Excess: 2 mmol/L (ref 0.0–2.0)
Acid-Base Excess: 3 mmol/L — ABNORMAL HIGH (ref 0.0–2.0)
Bicarbonate: 27.1 mmol/L (ref 20.0–28.0)
Bicarbonate: 27.6 mmol/L (ref 20.0–28.0)
Calcium, Ion: 1.16 mmol/L (ref 1.15–1.40)
Calcium, Ion: 1.19 mmol/L (ref 1.15–1.40)
HCT: 37 % — ABNORMAL LOW (ref 39.0–52.0)
HCT: 37 % — ABNORMAL LOW (ref 39.0–52.0)
Hemoglobin: 12.6 g/dL — ABNORMAL LOW (ref 13.0–17.0)
Hemoglobin: 12.6 g/dL — ABNORMAL LOW (ref 13.0–17.0)
O2 Saturation: 61 %
O2 Saturation: 64 %
Potassium: 4.2 mmol/L (ref 3.5–5.1)
Potassium: 4.2 mmol/L (ref 3.5–5.1)
Sodium: 139 mmol/L (ref 135–145)
Sodium: 139 mmol/L (ref 135–145)
TCO2: 28 mmol/L (ref 22–32)
TCO2: 29 mmol/L (ref 22–32)
pCO2, Ven: 41.6 mm[Hg] — ABNORMAL LOW (ref 44–60)
pCO2, Ven: 42.2 mm[Hg] — ABNORMAL LOW (ref 44–60)
pH, Ven: 7.421 (ref 7.25–7.43)
pH, Ven: 7.424 (ref 7.25–7.43)
pO2, Ven: 31 mm[Hg] — CL (ref 32–45)
pO2, Ven: 33 mm[Hg] (ref 32–45)

## 2023-11-16 LAB — COMPREHENSIVE METABOLIC PANEL
ALT: 15 U/L (ref 0–44)
AST: 15 U/L (ref 15–41)
Albumin: 3.5 g/dL (ref 3.5–5.0)
Alkaline Phosphatase: 54 U/L (ref 38–126)
Anion gap: 10 (ref 5–15)
BUN: 25 mg/dL — ABNORMAL HIGH (ref 6–20)
CO2: 22 mmol/L (ref 22–32)
Calcium: 8.7 mg/dL — ABNORMAL LOW (ref 8.9–10.3)
Chloride: 105 mmol/L (ref 98–111)
Creatinine, Ser: 1.35 mg/dL — ABNORMAL HIGH (ref 0.61–1.24)
GFR, Estimated: >60 mL/min (ref >60)
Glucose, Bld: 115 mg/dL — ABNORMAL HIGH (ref 70–99)
Potassium: 3.8 mmol/L (ref 3.5–5.1)
Sodium: 137 mmol/L (ref 135–145)
Total Bilirubin: 0.5 mg/dL (ref 0.0–1.2)
Total Protein: 6.8 g/dL (ref 6.5–8.1)

## 2023-11-16 LAB — TSH: TSH: 11.524 u[IU]/mL — ABNORMAL HIGH (ref 0.350–4.500)

## 2023-11-16 LAB — LIPID PANEL
Cholesterol: 146 mg/dL (ref 0–200)
HDL: 24 mg/dL — ABNORMAL LOW (ref >40)
LDL Cholesterol: 62 mg/dL (ref 0–99)
Total CHOL/HDL Ratio: 6.1 {ratio}
Triglycerides: 301 mg/dL — ABNORMAL HIGH (ref <150)
VLDL: 60 mg/dL — ABNORMAL HIGH (ref 0–40)

## 2023-11-16 SURGERY — RIGHT/LEFT HEART CATH AND CORONARY ANGIOGRAPHY
Anesthesia: LOCAL

## 2023-11-16 MED ORDER — LIDOCAINE HCL (PF) 1 % IJ SOLN
INTRAMUSCULAR | Status: AC
Start: 2023-11-16 — End: ?
  Filled 2023-11-16: qty 30

## 2023-11-16 MED ORDER — HEPARIN (PORCINE) IN NACL 1000-0.9 UT/500ML-% IV SOLN
INTRAVENOUS | Status: DC | PRN
  Administered 2023-11-16: 1000 mL

## 2023-11-16 MED ORDER — LIDOCAINE HCL (PF) 1 % IJ SOLN
INTRAMUSCULAR | Status: DC | PRN
  Administered 2023-11-16 (×2): 2 mL

## 2023-11-16 MED ORDER — MIDAZOLAM HCL 2 MG/2ML IJ SOLN
INTRAMUSCULAR | Status: DC | PRN
  Administered 2023-11-16: 2 mg via INTRAVENOUS

## 2023-11-16 MED ORDER — SODIUM CHLORIDE 0.9 % IV SOLN: INTRAVENOUS | Status: DC

## 2023-11-16 MED ORDER — SACUBITRIL-VALSARTAN 24-26 MG PO TABS
1.0000 | ORAL_TABLET | Freq: Two times a day (BID) | ORAL | Status: DC
  Administered 2023-11-16 – 2023-11-17 (×2): 1 via ORAL
  Filled 2023-11-16 (×2): qty 1

## 2023-11-16 MED ORDER — SODIUM CHLORIDE 0.9 % IV SOLN: 250.0000 mL | INTRAVENOUS | Status: DC | PRN

## 2023-11-16 MED ORDER — VERAPAMIL HCL 2.5 MG/ML IV SOLN
INTRAVENOUS | Status: DC | PRN
  Administered 2023-11-16: 10 mL via INTRA_ARTERIAL

## 2023-11-16 MED ORDER — SACUBITRIL-VALSARTAN 97-103 MG PO TABS: 1.0000 | ORAL_TABLET | Freq: Two times a day (BID) | ORAL | Status: DC

## 2023-11-16 MED ORDER — VERAPAMIL HCL 2.5 MG/ML IV SOLN
INTRAVENOUS | Status: AC
  Filled 2023-11-16: qty 2

## 2023-11-16 MED ORDER — HYDROCODONE-ACETAMINOPHEN 7.5-325 MG PO TABS
1.0000 | ORAL_TABLET | Freq: Two times a day (BID) | ORAL | Status: DC | PRN
  Administered 2023-11-16 – 2023-11-17 (×2): 1 via ORAL
  Filled 2023-11-16 (×2): qty 1

## 2023-11-16 MED ORDER — HEPARIN SODIUM (PORCINE) 1000 UNIT/ML IJ SOLN
INTRAMUSCULAR | Status: AC
  Filled 2023-11-16: qty 10

## 2023-11-16 MED ORDER — FENTANYL CITRATE (PF) 100 MCG/2ML IJ SOLN
INTRAMUSCULAR | Status: DC | PRN
  Administered 2023-11-16 (×2): 25 ug via INTRAVENOUS

## 2023-11-16 MED ORDER — FUROSEMIDE 10 MG/ML IJ SOLN
20.0000 mg | Freq: Once | INTRAMUSCULAR | Status: AC
  Administered 2023-11-16: 20 mg via INTRAVENOUS
  Filled 2023-11-16: qty 2

## 2023-11-16 MED ORDER — HEPARIN SODIUM (PORCINE) 1000 UNIT/ML IJ SOLN
INTRAMUSCULAR | Status: DC | PRN
  Administered 2023-11-16: 5000 [IU] via INTRAVENOUS

## 2023-11-16 MED ORDER — HYDRALAZINE HCL 20 MG/ML IJ SOLN: 10.0000 mg | INTRAMUSCULAR | Status: AC | PRN

## 2023-11-16 MED ORDER — MIDAZOLAM HCL 2 MG/2ML IJ SOLN
INTRAMUSCULAR | Status: AC
  Filled 2023-11-16: qty 2

## 2023-11-16 MED ORDER — IOHEXOL 350 MG/ML SOLN
INTRAVENOUS | Status: DC | PRN
  Administered 2023-11-16: 45 mL

## 2023-11-16 MED ORDER — FENTANYL CITRATE (PF) 100 MCG/2ML IJ SOLN
INTRAMUSCULAR | Status: AC
  Filled 2023-11-16: qty 2

## 2023-11-16 SURGICAL SUPPLY — 10 items
CATH BALLN WEDGE 5F 110CM (CATHETERS) IMPLANT
CATH INFINITI AMBI 5FR TG (CATHETERS) IMPLANT
DEVICE RAD COMP TR BAND LRG (VASCULAR PRODUCTS) IMPLANT
GLIDESHEATH SLEND A-KIT 6F 22G (SHEATH) IMPLANT
GUIDEWIRE INQWIRE 1.5J.035X260 (WIRE) IMPLANT
INQWIRE 1.5J .035X260CM (WIRE) ×1
KIT SINGLE USE MANIFOLD (KITS) IMPLANT
KIT SYRINGE INJ CVI SPIKEX1 (MISCELLANEOUS) IMPLANT
PACK CARDIAC CATHETERIZATION (CUSTOM PROCEDURE TRAY) ×2 IMPLANT
SHEATH GLIDE SLENDER 4/5FR (SHEATH) IMPLANT

## 2023-11-16 NOTE — Progress Notes (Signed)
Pt and wife concerned about medication cost of entresto and jardiance. Wife looked on insurance plan and states they will not be able to afford these medications even after insurance.  RN instructed pt to verbalize to provider for a coupon opportunity to help pt be compliant.

## 2023-11-16 NOTE — Interval H&P Note (Signed)
History and Physical Interval Note:  11/16/2023 11:03 AM  Dustin Sampson  has presented today for surgery, with the diagnosis of cardiomyopathy.  The various methods of treatment have been discussed with the patient and family. After consideration of risks, benefits and other options for treatment, the patient has consented to  Procedure(s): RIGHT/LEFT HEART CATH AND CORONARY ANGIOGRAPHY (N/A) and possible coronary angioplasty as a surgical intervention.  The patient's history has been reviewed, patient examined, no change in status, stable for surgery.  I have reviewed the patient's chart and labs.  Questions were answered to the patient's satisfaction.    Cath Lab Visit (complete for each Cath Lab visit)  Clinical Evaluation Leading to the Procedure:   ACS: No.  Non-ACS:    Anginal Classification: CCS IV  Anti-ischemic medical therapy: Minimal Therapy (1 class of medications)  Non-Invasive Test Results: No non-invasive testing performed  Prior CABG: No previous CABG   Yates Decamp

## 2023-11-16 NOTE — Progress Notes (Signed)
Patient Name: Dustin Sampson Date of Encounter: 11/16/2023 [facility] Cardiologist: Jodelle Red, MD   Interval Summary  .    Feels good today no complaints no chest pain or shortness of breath.  Plan for cardiac catheterization  Vital Signs .    Vitals:   11/15/23 2036 11/15/23 2149 11/16/23 0448 11/16/23 0606  BP: 119/84   (!) 125/95  Pulse: 93   87  Resp: 20 15  18   Temp: 98.2 F (36.8 C)   97.7 F (36.5 C)  TempSrc: Oral   Oral  SpO2: 95%   96%  Weight:   96.8 kg   Height:        Intake/Output Summary (Last 24 hours) at 11/16/2023 0827 Last data filed at 11/16/2023 4098 Gross per 24 hour  Intake 483 ml  Output 600 ml  Net -117 ml      11/16/2023    4:48 AM 11/15/2023    5:00 AM 11/14/2023    2:11 PM  Last 3 Weights  Weight (lbs) 213 lb 4.8 oz 214 lb 11.7 oz 216 lb 0.8 oz  Weight (kg) 96.752 kg 97.4 kg 98 kg      Telemetry/ECG    Sinus rhythm heart rates in the 80s.  Frequent PVCs.  Sometimes bigeminy .- Personally Reviewed  CV Studies    Echocardiogram 11/15/2023 1. Left ventricular ejection fraction, by estimation, is 20 to 25%. The  left ventricle has severely decreased function. The left ventricle  demonstrates global hypokinesis. The left ventricular internal cavity size  was moderately dilated. Left  ventricular diastolic parameters are indeterminate.   2. Right ventricular systolic function is normal. The right ventricular  size is normal. Tricuspid regurgitation signal is inadequate for assessing  PA pressure.   3. The mitral valve is normal in structure. Trivial mitral valve  regurgitation. No evidence of mitral stenosis.   4. The aortic valve was not well visualized. Aortic valve regurgitation  is not visualized. No aortic stenosis is present.   5. The inferior vena cava is normal in size with greater than 50%  respiratory variability, suggesting right atrial pressure of 3 mmHg.   Physical Exam .   GEN: No acute distress.    Neck: No JVD Cardiac: RRR, no murmurs, rubs, or gallops.  Respiratory: Clear to auscultation bilaterally. GI: Soft, nontender, non-distended  MS: No edema  Patient Profile    Dustin Sampson is a 58 y.o. male has hx of   presumed CAD and ischemic cardiomyopathy, PAD with claudication status post stenting of bilateral SFAs in November 2021, 10% PVC burden on monitor in January 2023, diffuse large B-cell lymphoma, CVA, ataxia, hypertension, hyperlipidemia, type 2 diabetes, hypothyroidism, MGUS, nicotine dependence 1 pack/day, CKD.  Admitted for CHF exacerbation  Assessment & Plan .      Presumed ischemic cardiomyopathy with newly reduced EF Previous abnormal nuclear stress test in 2021 that showed inferior/inferoseptal perfusion defect.  Cardiac catheterization deferred by patient at that time. EF 45%. Reportedly EF normalized in September 2023 though actual report is not viewable. Echo this admission shows significant drop in EF 20 to 25% with global hypokinesis, normal RV function.  Plan for cardiac catheterization today.    Euvolemic with no complaints   Post cath plan depending on blood pressure and renal function will be as below: GDMT: start Jardiance 10 mg, transition to p.o. Lasix 40 mg daily versus as needed, Jardiance has mild diuretic effect so he may not need Lasix daily.  On  Toprol-XL 50 mg.   Reports being noncompliant with his other medications (because of his SOB).  PTA  was on losartan 100mg , but so may need to restart at lower dose or Entresto if able.  Appears to be at baseline weight compared to outpatient office numbers around 215.    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  This might be contributing to his cardiomyopathy. Currently on Toprol XL 50 mg, may need to increase dose to suppress ectopy better.   PAD status post stenting to bilateral SFAs  08/2020 Prior CVA On Plavix, high intensity statin Will order lipid panel and LP(a)   Diabetes A1c 6.0%.   CKD Stable but from baseline but okay for cardiac catheterization.  1.25.  Nicotine dependence Still smokes about 1 pack/day.  Counseled on cessation   Informed Consent Shared Decision Making/Informed Consent   The risks [stroke (1 in 1000), death (1 in 1000), kidney failure [usually temporary] (1 in 500), bleeding (1 in 200), allergic reaction [possibly serious] (1 in 200)], benefits (diagnostic support and management of coronary artery disease) and alternatives of a cardiac catheterization were discussed in detail with Dustin Sampson and he is willing to proceed. For questions or updates, please contact [facility] Please consult [email or web address] for contact info under        Signed, Abagail Kitchens, PA-C

## 2023-11-16 NOTE — Progress Notes (Signed)
[facility] at bedside, pt to return to [facility], report given and [facility] RN to be notified of pt return, safety maintained, right radial site wnl, drsg remains intact, no bleeding noted

## 2023-11-16 NOTE — H&P (View-Only) (Signed)
Patient Name: Dustin Sampson Date of Encounter: 11/16/2023 [facility] Cardiologist: Jodelle Red, MD   Interval Summary  .    Feels good today no complaints no chest pain or shortness of breath.  Plan for cardiac catheterization  Vital Signs .    Vitals:   11/15/23 2036 11/15/23 2149 11/16/23 0448 11/16/23 0606  BP: 119/84   (!) 125/95  Pulse: 93   87  Resp: 20 15  18   Temp: 98.2 F (36.8 C)   97.7 F (36.5 C)  TempSrc: Oral   Oral  SpO2: 95%   96%  Weight:   96.8 kg   Height:        Intake/Output Summary (Last 24 hours) at 11/16/2023 0827 Last data filed at 11/16/2023 4098 Gross per 24 hour  Intake 483 ml  Output 600 ml  Net -117 ml      11/16/2023    4:48 AM 11/15/2023    5:00 AM 11/14/2023    2:11 PM  Last 3 Weights  Weight (lbs) 213 lb 4.8 oz 214 lb 11.7 oz 216 lb 0.8 oz  Weight (kg) 96.752 kg 97.4 kg 98 kg      Telemetry/ECG    Sinus rhythm heart rates in the 80s.  Frequent PVCs.  Sometimes bigeminy .- Personally Reviewed  CV Studies    Echocardiogram 11/15/2023 1. Left ventricular ejection fraction, by estimation, is 20 to 25%. The  left ventricle has severely decreased function. The left ventricle  demonstrates global hypokinesis. The left ventricular internal cavity size  was moderately dilated. Left  ventricular diastolic parameters are indeterminate.   2. Right ventricular systolic function is normal. The right ventricular  size is normal. Tricuspid regurgitation signal is inadequate for assessing  PA pressure.   3. The mitral valve is normal in structure. Trivial mitral valve  regurgitation. No evidence of mitral stenosis.   4. The aortic valve was not well visualized. Aortic valve regurgitation  is not visualized. No aortic stenosis is present.   5. The inferior vena cava is normal in size with greater than 50%  respiratory variability, suggesting right atrial pressure of 3 mmHg.   Physical Exam .   GEN: No acute distress.    Neck: No JVD Cardiac: RRR, no murmurs, rubs, or gallops.  Respiratory: Clear to auscultation bilaterally. GI: Soft, nontender, non-distended  MS: No edema  Patient Profile    Dustin Sampson is a 58 y.o. male has hx of   presumed CAD and ischemic cardiomyopathy, PAD with claudication status post stenting of bilateral SFAs in November 2021, 10% PVC burden on monitor in January 2023, diffuse large B-cell lymphoma, CVA, ataxia, hypertension, hyperlipidemia, type 2 diabetes, hypothyroidism, MGUS, nicotine dependence 1 pack/day, CKD.  Admitted for CHF exacerbation  Assessment & Plan .      Presumed ischemic cardiomyopathy with newly reduced EF Previous abnormal nuclear stress test in 2021 that showed inferior/inferoseptal perfusion defect.  Cardiac catheterization deferred by patient at that time. EF 45%. Reportedly EF normalized in September 2023 though actual report is not viewable. Echo this admission shows significant drop in EF 20 to 25% with global hypokinesis, normal RV function.  Plan for cardiac catheterization today.    Euvolemic with no complaints   Post cath plan depending on blood pressure and renal function will be as below: GDMT: start Jardiance 10 mg, transition to p.o. Lasix 40 mg daily versus as needed, Jardiance has mild diuretic effect so he may not need Lasix daily.  On  Toprol-XL 50 mg.   Reports being noncompliant with his other medications (because of his SOB).  PTA  was on losartan 100mg , but so may need to restart at lower dose or Entresto if able.  Appears to be at baseline weight compared to outpatient office numbers around 215.    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  This might be contributing to his cardiomyopathy. Currently on Toprol XL 50 mg, may need to increase dose to suppress ectopy better.   PAD status post stenting to bilateral SFAs  08/2020 Prior CVA On Plavix, high intensity statin Will order lipid panel and LP(a)   Diabetes A1c 6.0%.   CKD Stable but from baseline but okay for cardiac catheterization.  1.25.  Nicotine dependence Still smokes about 1 pack/day.  Counseled on cessation   Informed Consent Shared Decision Making/Informed Consent   The risks [stroke (1 in 1000), death (1 in 1000), kidney failure [usually temporary] (1 in 500), bleeding (1 in 200), allergic reaction [possibly serious] (1 in 200)], benefits (diagnostic support and management of coronary artery disease) and alternatives of a cardiac catheterization were discussed in detail with [name] and he is willing to proceed. For questions or updates, please contact [facility] Please consult [email or web address] for contact info under        Signed, Abagail Kitchens, PA-C

## 2023-11-16 NOTE — Progress Notes (Addendum)
PROGRESS NOTE    Dustin Sampson  ZOX:[id number] DOB: December 25, 1965 DOA: 11/14/2023 PCP: Jarrett Soho, PA-C  Chief Complaint  Patient presents with   Shortness of Breath    Brief Narrative:   Dustin Sampson is Dustin Sampson 58 y.o. 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    He's been admitted with HF exacerbation.   Plan for cath 11/16/2023.  Assessment & Plan:   Principal Problem:   CHF exacerbation (HCC) Active Problems:   Non-insulin dependent type 2 diabetes mellitus (HCC)   Essential hypertension   Hypothyroidism   PVD (peripheral vascular disease) (HCC)   Hyperlipidemia   History of CVA (cerebrovascular accident)   History of frequent PVCs   MGUS (monoclonal gammopathy of unknown significance)   Acute kidney injury superimposed on chronic kidney disease (HCC)   Gout   GAD (generalized anxiety disorder)   Congestive heart failure (HCC)   Diastolic congestive heart failure (HCC)   Acute on chronic combined systolic and diastolic CHF (congestive heart failure) (HCC)  Acute systolic heart failure exacerbation Coronary Artery Disease - CT PE protocol negative for PE, notable for small to moderate bilateral effusions -- echo with EF 20-25% -- intermediate risk stress test 08/2020  -- plan for catheterization with cardiology on 11/16/23 (today) -- currently appears more euvolemic, GDMT per cardiology    Hyponatremia - monitor with diuresis   Peripheral vascular disease -Continue Lipitor and Plavix.   History of CVA -Continue Lipitor and Plavix   History of frequent PVC -Continue Toprol-XL 50 mg daily.   History of  MGUS -Patient has been following up with outpatient oncology for further workup.   AKI superimposed CKD 2 -creatinine 1.4 on presentation.  Baseline creatinine around 1.1  - trend - not quite back to baseline  History of gout - Patient reported not taking febuxostat.   Generalized anxiety disorder -Continue Lexapro, Zoloft and Restoril at bedtime.   Non-insulin-dependent DM type II -At home patient is on metformin.   -- A1c 6.0 - Continue low sliding scale SSI and at bedtime insulin as needed coverage with mealtime.   Hypothyroidism -Continue levothyroxine 200 mcg daily.  TSH is elevated, 200 mcg daily is high for his weight (1.6 mcg/kg would be around 150 mcg estimated dose for him -> would discuss adherence with him first prior to increasing his dose) -- follow TSH   Chronic smoking of cigarettes - Patient currently smoking half pack to 1 pack cigarettes per day - nicotine gum  - encourage cessation   Ataxia Known hx for several years, undergoing neuro w/u     DVT prophylaxis: lovenox Code Status: full Family Communication: none Disposition:   Status is: Inpatient Remains inpatient appropriate because: need for continued inpatient care   Consultants:  cardiology  Procedures:   Echo IMPRESSIONS     1. Left ventricular ejection fraction, by estimation, is 20 to 25%. The  left ventricle has severely decreased function. The left ventricle  demonstrates global hypokinesis. The left ventricular internal cavity size  was moderately dilated. Left  ventricular diastolic parameters are indeterminate.   2. Right ventricular systolic function is normal. The right ventricular  size is normal. Tricuspid regurgitation signal is inadequate for assessing  PA  pressure.   3. The mitral valve is normal in structure. Trivial mitral valve  regurgitation. No evidence of mitral stenosis.   4. The aortic valve was not well visualized. Aortic valve regurgitation  is not  visualized. No aortic stenosis is present.   5. The inferior vena cava is normal in size with greater than 50%  respiratory variability, suggesting right atrial pressure of 3 mmHg.    Antimicrobials:  Anti-infectives (From admission, onward)    None       Subjective: No complaints  Objective: Vitals:   11/16/23 1148 11/16/23 1153 11/16/23 1158 11/16/23 1203  BP: (!) 149/91 (!) 162/80 (!) 167/87 (!) 162/100  Pulse: 87 88 83 87  Resp: 16 (!) 23 14 18   Temp:      TempSrc:      SpO2: 97% 97% 99%   Weight:      Height:        Intake/Output Summary (Last 24 hours) at 11/16/2023 1207 Last data filed at 11/16/2023 0900 Gross per 24 hour  Intake 363 ml  Output 600 ml  Net -237 ml   Filed Weights   11/14/23 1411 11/15/23 0500 11/16/23 0448  Weight: 98 kg 97.4 kg 96.8 kg    Examination:  General: No acute distress. Cardiovascular: RRR Lungs: unlabored Neurological: Alert and oriented 3. Moves all extremities 4 with equal strength. Cranial nerves II through XII grossly intact. Extremities: No clubbing or cyanosis. No edema.   Data Reviewed: I have personally reviewed following labs and imaging studies  CBC: Recent Labs  Lab 11/14/23 1419 11/15/23 0456  WBC 10.6* 8.3  HGB 13.0 12.4*  HCT 39.3 38.0*  MCV 89.3 91.3  PLT 340 278    Basic Metabolic Panel: Recent Labs  Lab 11/14/23 1419 11/15/23 0456 11/16/23 0449  NA 133* 136 137  K 3.7 3.5 3.8  CL 101 103 105  CO2 22 24 22   GLUCOSE 166* 131* 115*  BUN 24* 27* 25*  CREATININE 1.44* 1.32* 1.35*  CALCIUM 9.3 8.8* 8.7*    GFR: Estimated Creatinine Clearance: 73.1 mL/min (Dustin Sampson) (by C-G formula based on SCr of 1.35 mg/dL (H)).  Liver Function Tests: Recent Labs  Lab 11/15/23 0456 11/16/23 0449  AST 19 15  ALT 17 15  ALKPHOS 62 54  BILITOT 0.6 0.5  PROT 6.8 6.8  ALBUMIN 3.8 3.5    CBG: Recent Labs  Lab 11/14/23 2102 11/15/23 0743 11/15/23 1725 11/15/23 2032 11/16/23 0759  GLUCAP 186* 131*  132* 152* 127*     No results found for this or any previous visit (from the past 240 hours).       Radiology Studies: ECHOCARDIOGRAM COMPLETE Result Date: 11/15/2023    ECHOCARDIOGRAM REPORT   Patient Name:   Dustin Sampson  Date of Exam: 11/15/2023 Medical Rec #:  [id number]  Height:       73.0 in Accession #:    [id number] Weight:       214.7 lb Date of Birth:  August 23, 1966 BSA:          2.218 m Patient Age:    57 years   BP:           120/71 mmHg Patient Gender: M          HR:           98 bpm. Exam Location:  Inpatient Procedure: 2D Echo, Cardiac Doppler, Color Doppler and Intracardiac            Opacification Agent REPORT  CONTAINS CRITICAL RESULT Indications:    I50.40* Unspecified combined systolic (congestive) and diastolic                 (congestive) heart failure  History:        Patient has prior history of Echocardiogram examinations, most                 recent 09/21/2020. CHF and Cardiomyopathy, CAD, Stroke; Risk                 Factors:Hypertension and Diabetes.  Sonographer:    Sheralyn Boatman RDCS Referring Phys: [id number] SUBRINA SUNDIL IMPRESSIONS  1. Left ventricular ejection fraction, by estimation, is 20 to 25%. The left ventricle has severely decreased function. The left ventricle demonstrates global hypokinesis. The left ventricular internal cavity size was moderately dilated. Left ventricular diastolic parameters are indeterminate.  2. Right ventricular systolic function is normal. The right ventricular size is normal. Tricuspid regurgitation signal is inadequate for assessing PA pressure.  3. The mitral valve is normal in structure. Trivial mitral valve regurgitation. No evidence of mitral stenosis.  4. The aortic valve was not well visualized. Aortic valve regurgitation is not visualized. No aortic stenosis is present.  5. The inferior vena cava is normal in size with greater than 50% respiratory variability, suggesting right atrial pressure of 3 mmHg. FINDINGS  Left Ventricle: Left  ventricular ejection fraction, by estimation, is 20 to 25%. The left ventricle has severely decreased function. The left ventricle demonstrates global hypokinesis. Definity contrast agent was given IV to delineate the left ventricular endocardial borders. The left ventricular internal cavity size was moderately dilated. There is no left ventricular hypertrophy. Left ventricular diastolic parameters are indeterminate. Right Ventricle: The right ventricular size is normal. No increase in right ventricular wall thickness. Right ventricular systolic function is normal. Tricuspid regurgitation signal is inadequate for assessing PA pressure. Left Atrium: Left atrial size was normal in size. Right Atrium: Right atrial size was normal in size. Pericardium: There is no evidence of pericardial effusion. Mitral Valve: The mitral valve is normal in structure. Trivial mitral valve regurgitation. No evidence of mitral valve stenosis. Tricuspid Valve: The tricuspid valve is normal in structure. Tricuspid valve regurgitation is trivial. Aortic Valve: The aortic valve was not well visualized. Aortic valve regurgitation is not visualized. No aortic stenosis is present. Pulmonic Valve: The pulmonic valve was not well visualized. Pulmonic valve regurgitation is trivial. Aorta: The aortic root and ascending aorta are structurally normal, with no evidence of dilitation. Venous: The inferior vena cava is normal in size with greater than 50% respiratory variability, suggesting right atrial pressure of 3 mmHg. IAS/Shunts: The interatrial septum was not well visualized.  LEFT VENTRICLE PLAX 2D LVIDd:         6.40 cm      Diastology LVIDs:         5.20 cm      LV e' medial:    4.13 cm/s LV PW:         1.00 cm      LV E/e' medial:  26.2 LV IVS:        0.90 cm      LV e' lateral:   6.42 cm/s LVOT diam:     2.10 cm      LV E/e' lateral: 16.8 LV SV:         47 LV SV Index:   21 LVOT Area:     3.46 cm  LV Volumes (MOD) LV vol  d, MOD A2C: 178.0 ml  LV vol d, MOD A4C: 136.0 ml LV vol s, MOD A2C: 154.0 ml LV vol s, MOD A4C: 111.0 ml LV SV MOD A2C:     24.0 ml LV SV MOD A4C:     136.0 ml LV SV MOD BP:      15.3 ml RIGHT VENTRICLE             IVC RV S prime:     12.30 cm/s  IVC diam: 2.00 cm TAPSE (M-mode): 2.1 cm LEFT ATRIUM             Index        RIGHT ATRIUM           Index LA diam:        4.90 cm 2.21 cm/m   RA Area:     13.50 cm LA Vol (A2C):   92.8 ml 41.85 ml/m  RA Volume:   33.60 ml  15.15 ml/m LA Vol (A4C):   33.1 ml 14.93 ml/m LA Biplane Vol: 54.9 ml 24.76 ml/m  AORTIC VALVE LVOT Vmax:   89.70 cm/s LVOT Vmean:  55.800 cm/s LVOT VTI:    0.135 m  AORTA Ao Root diam: 3.10 cm Ao Asc diam:  3.20 cm MITRAL VALVE MV Area (PHT): 5.54 cm     SHUNTS MV Decel Time: 137 msec     Systemic VTI:  0.14 m MV E velocity: 108.00 cm/s  Systemic Diam: 2.10 cm Dustin Lesches MD Electronically signed by Dustin Lesches MD Signature Date/Time: 11/15/2023/3:06:26 PM    Final    CT Angio Chest PE W and/or Wo Contrast Result Date: 11/14/2023 CLINICAL DATA:  Pulmonary embolism (PE) suspected, high prob. Shortness of breath EXAM: CT ANGIOGRAPHY CHEST WITH CONTRAST TECHNIQUE: Multidetector CT imaging of the chest was performed using the standard protocol during bolus administration of intravenous contrast. Multiplanar CT image reconstructions and MIPs were obtained to evaluate the vascular anatomy. RADIATION DOSE REDUCTION: This exam was performed according to the departmental dose-optimization program which includes automated exposure control, adjustment of the mA and/or kV according to patient size and/or use of iterative reconstruction technique. CONTRAST:  75mL OMNIPAQUE IOHEXOL 350 MG/ML SOLN COMPARISON:  06/20/2022 FINDINGS: Cardiovascular: No filling defects in the pulmonary arteries to suggest pulmonary emboli. Heart is normal size. Aorta is normal caliber. Scattered coronary artery and aortic calcifications. Mediastinum/Nodes: No mediastinal, hilar, or  axillary adenopathy. Trachea and esophagus are unremarkable. Thyroid unremarkable. Lungs/Pleura: Small to moderate bilateral pleural effusions. No confluent airspace opacities. Upper Abdomen: No acute findings Musculoskeletal: Chest wall soft tissues are unremarkable. No acute bony abnormality. Review of the MIP images confirms the above findings. IMPRESSION: No evidence of pulmonary embolus. Small to moderate bilateral pleural effusions. Scattered coronary artery disease. Aortic Atherosclerosis (ICD10-I70.0). Electronically Signed   By: Charlett Nose M.D.   On: 11/14/2023 18:40   DG Chest 2 View Result Date: 11/14/2023 CLINICAL DATA:  Shortness of breath. EXAM: CHEST - 2 VIEW COMPARISON:  04/02/2013 FINDINGS: The heart size and mediastinal contours are within normal limits. There is no evidence of pulmonary edema, consolidation, pneumothorax, nodule or pleural fluid. The visualized skeletal structures are unremarkable. IMPRESSION: No active cardiopulmonary disease. Electronically Signed   By: Irish Lack M.D.   On: 11/14/2023 15:01        Scheduled Meds:  [MAR Hold] atorvastatin  80 mg Oral QPM   [MAR Hold] clopidogrel  75 mg Oral Daily   [MAR Hold] enoxaparin (LOVENOX) injection  40  mg Subcutaneous Q24H   [MAR Hold] escitalopram  5 mg Oral Daily   [MAR Hold] insulin aspart  0-5 Units Subcutaneous QHS   [MAR Hold] insulin aspart  0-6 Units Subcutaneous TID WC   [MAR Hold] levothyroxine  200 mcg Oral QAC breakfast   [MAR Hold] metoprolol succinate  50 mg Oral Daily   [MAR Hold] sertraline  25 mg Oral Daily   [MAR Hold] sodium chloride flush  3 mL Intravenous Q12H   [MAR Hold] temazepam  30 mg Oral QHS   Continuous Infusions:  sodium chloride 10 mL/hr at 11/16/23 0847     LOS: 2 days    Time spent: over 30 min    Lacretia Nicks, MD [facility]   To contact the attending provider between 7A-7P or the covering provider during after hours 7P-7A, please log into the web  site [email or web address] and access using universal [place] password for that web site. If you do not have the password, please call the hospital operator.  11/16/2023, 12:07 PM

## 2023-11-17 ENCOUNTER — Other Ambulatory Visit (HOSPITAL_COMMUNITY): Payer: Self-pay

## 2023-11-17 ENCOUNTER — Encounter (HOSPITAL_COMMUNITY): Payer: Self-pay | Admitting: Cardiology

## 2023-11-17 DIAGNOSIS — I5043 Acute on chronic combined systolic (congestive) and diastolic (congestive) heart failure: Secondary | ICD-10-CM | POA: Diagnosis not present

## 2023-11-17 DIAGNOSIS — I509 Heart failure, unspecified: Secondary | ICD-10-CM | POA: Diagnosis not present

## 2023-11-17 LAB — GLUCOSE, CAPILLARY
Glucose-Capillary: 136 mg/dL — ABNORMAL HIGH (ref 70–99)
Glucose-Capillary: 162 mg/dL — ABNORMAL HIGH (ref 70–99)

## 2023-11-17 LAB — COMPREHENSIVE METABOLIC PANEL
ALT: 15 U/L (ref 0–44)
AST: 18 U/L (ref 15–41)
Albumin: 3.7 g/dL (ref 3.5–5.0)
Alkaline Phosphatase: 57 U/L (ref 38–126)
Anion gap: 9 (ref 5–15)
BUN: 24 mg/dL — ABNORMAL HIGH (ref 6–20)
CO2: 22 mmol/L (ref 22–32)
Calcium: 8.9 mg/dL (ref 8.9–10.3)
Chloride: 102 mmol/L (ref 98–111)
Creatinine, Ser: 1.07 mg/dL (ref 0.61–1.24)
GFR, Estimated: >60 mL/min (ref >60)
Glucose, Bld: 128 mg/dL — ABNORMAL HIGH (ref 70–99)
Potassium: 3.7 mmol/L (ref 3.5–5.1)
Sodium: 133 mmol/L — ABNORMAL LOW (ref 135–145)
Total Bilirubin: 0.7 mg/dL (ref 0.0–1.2)
Total Protein: 7 g/dL (ref 6.5–8.1)

## 2023-11-17 LAB — CBC WITH DIFFERENTIAL/PLATELET
Abs Immature Granulocytes: 0.03 10*3/uL (ref 0.00–0.07)
Basophils Absolute: 0.1 10*3/uL (ref 0.0–0.1)
Basophils Relative: 1 %
Eosinophils Absolute: 0.3 10*3/uL (ref 0.0–0.5)
Eosinophils Relative: 4 %
HCT: 38.9 % — ABNORMAL LOW (ref 39.0–52.0)
Hemoglobin: 12.7 g/dL — ABNORMAL LOW (ref 13.0–17.0)
Immature Granulocytes: 0 %
Lymphocytes Relative: 19 %
Lymphs Abs: 1.4 10*3/uL (ref 0.7–4.0)
MCH: 29.5 pg (ref 26.0–34.0)
MCHC: 32.6 g/dL (ref 30.0–36.0)
MCV: 90.5 fL (ref 80.0–100.0)
Monocytes Absolute: 0.6 10*3/uL (ref 0.1–1.0)
Monocytes Relative: 8 %
Neutro Abs: 5.1 10*3/uL (ref 1.7–7.7)
Neutrophils Relative %: 68 %
Platelets: 290 10*3/uL (ref 150–400)
RBC: 4.3 MIL/uL (ref 4.22–5.81)
RDW: 14.6 % (ref 11.5–15.5)
WBC: 7.6 10*3/uL (ref 4.0–10.5)
nRBC: 0 % (ref 0.0–0.2)

## 2023-11-17 LAB — PHOSPHORUS: Phosphorus: 3.5 mg/dL (ref 2.5–4.6)

## 2023-11-17 LAB — MAGNESIUM: Magnesium: 2.2 mg/dL (ref 1.7–2.4)

## 2023-11-17 MED ORDER — SPIRONOLACTONE 25 MG PO TABS
12.5000 mg | ORAL_TABLET | Freq: Every day | ORAL | 1 refills | Status: DC
  Filled 2023-11-17: qty 15, 30d supply, fill #0
  Filled 2023-12-18: qty 15, 30d supply, fill #1

## 2023-11-17 MED ORDER — SPIRONOLACTONE 12.5 MG HALF TABLET
12.5000 mg | ORAL_TABLET | Freq: Every day | ORAL | Status: DC
  Administered 2023-11-17: 12.5 mg via ORAL
  Filled 2023-11-17: qty 1

## 2023-11-17 MED ORDER — EMPAGLIFLOZIN 10 MG PO TABS
10.0000 mg | ORAL_TABLET | Freq: Every day | ORAL | 1 refills | Status: DC
  Filled 2023-11-17: qty 30, 30d supply, fill #0
  Filled 2023-12-18: qty 30, 30d supply, fill #1

## 2023-11-17 MED ORDER — EMPAGLIFLOZIN 10 MG PO TABS
10.0000 mg | ORAL_TABLET | Freq: Every day | ORAL | Status: DC
  Administered 2023-11-17: 10 mg via ORAL
  Filled 2023-11-17: qty 1

## 2023-11-17 MED ORDER — SACUBITRIL-VALSARTAN 24-26 MG PO TABS
1.0000 | ORAL_TABLET | Freq: Two times a day (BID) | ORAL | 1 refills | Status: DC
  Filled 2023-11-17: qty 60, 30d supply, fill #0
  Filled 2023-12-18: qty 60, 30d supply, fill #1

## 2023-11-17 NOTE — Progress Notes (Signed)
Patient Name: Dustin Sampson Date of Encounter: 11/17/2023 [facility] Cardiologist: Jodelle Red, MD   Interval Summary  .    Ambulating in halls with walker.  Discussed cath results  Vital Signs .    Vitals:   11/16/23 2123 11/17/23 0315 11/17/23 0409 11/17/23 0410  BP:    109/86  Pulse:    89  Resp: 11 10  18   Temp:    (!) 97.5 F (36.4 C)  TempSrc:    Oral  SpO2:    97%  Weight:   96.8 kg   Height:        Intake/Output Summary (Last 24 hours) at 11/17/2023 1008 Last data filed at 11/17/2023 0408 Gross per 24 hour  Intake 363 ml  Output 1350 ml  Net -987 ml      11/17/2023    4:09 AM 11/16/2023    4:48 AM 11/15/2023    5:00 AM  Last 3 Weights  Weight (lbs) 213 lb 8 oz 213 lb 4.8 oz 214 lb 11.7 oz  Weight (kg) 96.843 kg 96.752 kg 97.4 kg      Telemetry/ECG    Sinus rhythm heart rates in the 80s.  Frequent PVCs.  Sometimes bigeminy .- Personally Reviewed  CV Studies    Echocardiogram 11/15/2023 1. Left ventricular ejection fraction, by estimation, is 20 to 25%. The  left ventricle has severely decreased function. The left ventricle  demonstrates global hypokinesis. The left ventricular internal cavity size  was moderately dilated. Left  ventricular diastolic parameters are indeterminate.   2. Right ventricular systolic function is normal. The right ventricular  size is normal. Tricuspid regurgitation signal is inadequate for assessing  PA pressure.   3. The mitral valve is normal in structure. Trivial mitral valve  regurgitation. No evidence of mitral stenosis.   4. The aortic valve was not well visualized. Aortic valve regurgitation  is not visualized. No aortic stenosis is present.   5. The inferior vena cava is normal in size with greater than 50%  respiratory variability, suggesting right atrial pressure of 3 mmHg.   Physical Exam .   GEN: No acute distress.   Neck: No JVD Cardiac: RRR, no murmurs, rubs, or gallops.  Respiratory:  Clear to auscultation bilaterally. GI: Soft, nontender, non-distended  MS: No edema  Patient Profile    Dustin Sampson is a 58 y.o. male has hx of   presumed CAD and ischemic cardiomyopathy, PAD with claudication status post stenting of bilateral SFAs in November 2021, 10% PVC burden on monitor in January 2023, diffuse large B-cell lymphoma, CVA, ataxia, hypertension, hyperlipidemia, type 2 diabetes, hypothyroidism, MGUS, nicotine dependence 1 pack/day, CKD.  Admitted for CHF exacerbation  Assessment & Plan .      Presumed ischemic cardiomyopathy with newly reduced EF Previous abnormal nuclear stress test in 2021 that showed inferior/inferoseptal perfusion defect.  Cardiac catheterization deferred by patient at that time. EF 45%. Reportedly EF normalized in September 2023 though actual report is not viewable. Echo this admission shows significant drop in EF 20 to 25% with global hypokinesis, normal RV function.  Cardiomyopathy Mixed Ischemic/Non ischemic    Cath by Dr Jacinto Halim with occluded collateralized distal RCA and 90% ulcerated plaque in small OM Rx medically  On Toprol and entresto started yesterday Add Jardiance and aldactone today K, 3.7 , Cr 1.07   PVCs Asymptomatic, he has frequent PVCs with previous monitor in 2023 indicating 10% burden.  This might be contributing to his cardiomyopathy. Currently on  Toprol XL 50 mg,   PAD status post stenting to bilateral SFAs 08/2020 Prior CVA On Plavix, high intensity statin  Diabetes A1c 6.0%.   CKD Stable but from baseline but okay for cardiac catheterization.  1.07  Nicotine dependence Still smokes about 1 pack/day.  Counseled on cessation   Ok to d/c home will arrange BMET/BNP in a week and f/u with CHF clinic     Signed, Charlton Haws, MD

## 2023-11-17 NOTE — Progress Notes (Signed)
Mobility Specialist - Progress Note   11/17/23 0958  Mobility  Activity Ambulated with assistance in hallway  Level of Assistance Contact guard assist, steadying assist  Assistive Device Front wheel walker  Distance Ambulated (ft) 220 ft  Range of Motion/Exercises Active  Activity Response Tolerated well  Mobility Referral Yes  Mobility visit 1 Mobility  Mobility Specialist Start Time (ACUTE ONLY) [id number]  Mobility Specialist Stop Time (ACUTE ONLY) 0958  Mobility Specialist Time Calculation (min) (ACUTE ONLY) 10 min   Pt was found in bed and agreeable to ambulate. Grew fatigued with session. At EOS returned to bed with all needs met. Call bell in reach.  Billey Chang Mobility Specialist

## 2023-11-17 NOTE — TOC Transition Note (Signed)
Transition of Care [facility]) - Discharge Note   Patient Details  Name: Dustin Sampson MRN: [id number] Date of Birth: September 02, 1966  Transition of [facility]) CM/SW Contact:  Lanier Clam, RN Phone Number: 11/17/2023, 1:18 PM   Clinical Narrative:  d/c home no needs.     Final next level of care: Home/Self Care Barriers to Discharge: No Barriers Identified   Patient Goals and CMS Choice Patient states their goals for this hospitalization and ongoing recovery are:: Home CMS Medicare.gov Compare Post Acute Care list provided to:: Patient Choice offered to / list presented to : Patient Ruth ownership interest in [facility].provided to:: Patient    Discharge Placement                       Discharge Plan and Services Additional resources added to the After Visit Summary for                                       Social Drivers of Health (SDOH) Interventions SDOH Screenings   Food Insecurity: No Food Insecurity (11/14/2023)  Housing: Low Risk  (11/14/2023)  Transportation Needs: No Transportation Needs (11/14/2023)  Utilities: Not At Risk (11/14/2023)  Social Connections: Moderately Integrated (11/14/2023)  Tobacco Use: High Risk (11/14/2023)     Readmission Risk Interventions     No data to display

## 2023-11-17 NOTE — Progress Notes (Signed)
Heart Failure Navigator Progress Note  Assessed for Heart & Vascular TOC clinic readiness.  Patient has a scheduled [facility] appointment on .11/27/2023.   Navigator will sign off at this time.   Rhae Hammock, BSN, Scientist, clinical (histocompatibility and immunogenetics) Only

## 2023-11-17 NOTE — Discharge Summary (Signed)
Physician Discharge Summary  [name] MWU:[id number] DOB: 1966/05/24 DOA: 11/14/2023  PCP: Jarrett Soho, PA-C  Admit date: 11/14/2023 Discharge date: 11/17/2023  Time spent: 40 minutes  Recommendations for Outpatient Follow-up:  Follow outpatient CBC/CMP  Follow HF/volume outpatient Follow repeat CXR outpatient Needs repeat TSH in 4-6 weeks, discussed proper synthroid use  Needs cards follow up  Discharge Diagnoses:  Principal Problem:   CHF exacerbation (HCC) Active Problems:   Non-insulin dependent type 2 diabetes mellitus (HCC)   Essential hypertension   Hypothyroidism   PVD (peripheral vascular disease) (HCC)   Hyperlipidemia   History of CVA (cerebrovascular accident)   History of frequent PVCs   MGUS (monoclonal gammopathy of unknown significance)   Acute kidney injury superimposed on chronic kidney disease (HCC)   Gout   GAD (generalized anxiety disorder)   Congestive heart failure (HCC)   Diastolic congestive heart failure (HCC)   Acute on chronic combined systolic and diastolic CHF (congestive heart failure) (HCC)   Discharge Condition: stable  Diet recommendation: heart healthy, diabetic  Filed Weights   11/15/23 0500 11/16/23 0448 11/17/23 0409  Weight: 97.4 kg 96.8 kg 96.8 kg    History of present illness:   Dustin Sampson is Dustin Sampson 58 y.o. 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     He's been admitted with HF exacerbation.    Discharged 1/24 after cath 1/23 with plan for medical therapy.  See below and previous notes for additional details.  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  Ulcerated focal stenosis in OM1.  Symptoms and severe LV dysfunction with global hypokinesis does not correlate with underlying CAD.  Recommended medical therapy.  -- jardiance, entresto, metoprolol, spironolactone -- outpatient cards follow up   Hyponatremia monitor    Peripheral vascular disease Continue Lipitor and Plavix.   History of CVA Continue Lipitor and Plavix   History of frequent PVC Continue Toprol-XL 50 mg daily.   History of MGUS Patient has been following up with outpatient oncology for further workup.   AKI superimposed CKD 2 Back to baseline at discharge   History of gout febuxostat.   Generalized anxiety disorder -Continue Lexapro, Zoloft and Restoril at bedtime.   Non-insulin-dependent DM type II -At home patient is on metformin.  Marland Kitchen   Hypothyroidism -Continue levothyroxine 200 mcg daily.  TSH is elevated, 200 mcg daily is relatively high for his weight (1.6 mcg/kg would be around 150 mcg estimated dose for him -> per discussion today, he's not taking appropriately -> provided education, would follow repeat labs in Jymir Dunaj few weeks and consider dose adjustment)   Chronic smoking of cigarettes - Patient currently smoking half pack to 1 pack cigarettes per day - nicotine gum  - encourage cessation   Ataxia Known hx for several years, undergoing neuro w/u      Procedures: Echo IMPRESSIONS     1. Left ventricular ejection fraction, by estimation, is 20 to  25%. The  left ventricle has severely decreased function. The left ventricle  demonstrates global hypokinesis. The left ventricular internal cavity size  was moderately dilated. Left  ventricular diastolic parameters  are indeterminate.   2. Right ventricular systolic function is normal. The right ventricular  size is normal. Tricuspid regurgitation signal is inadequate for assessing  PA pressure.   3. The mitral valve is normal in structure. Trivial mitral valve  regurgitation. No evidence of mitral stenosis.   4. The aortic valve was not well visualized. Aortic valve regurgitation  is not visualized. No aortic stenosis is present.   5. The inferior vena cava is normal in size with greater than 50%  respiratory variability, suggesting right atrial pressure of 3 mmHg.   Catheterization Impression and recommendations: The thrombotic occlusion of the right coronary artery appears to be chronic and organized with collateralization of the distal RCA especially PL branch.  The ulcerated focal stenosis in OM1 although large vessel, overall his symptoms and presentation and severe LV systolic dysfunction noted on the echocardiogram with global hypokinesis does not correlate with underlying CAD.  Hence continued medical therapy with risk modification is indicated.   Consultations: cardiology  Discharge Exam: Vitals:   11/17/23 0315 11/17/23 0410  BP:  109/86  Pulse:  89  Resp: 10 18  Temp:  (!) 97.5 F (36.4 C)  SpO2:  97%   No complaints Eager to discharge home  General: No acute distress. Cardiovascular: RRR Lungs: Clear to auscultation bilaterally  Neurological: Alert and oriented 3. Moves all extremities 4 . Cranial nerves II through XII grossly intact. Skin: Warm and dry. No rashes or lesions. Extremities: No clubbing or cyanosis. No edema.   Discharge Instructions   Discharge Instructions     (HEART FAILURE PATIENTS) Call MD:  Anytime you have any of the following symptoms: 1) 3 pound weight gain in 24 hours or 5 pounds in 1 week 2) shortness of breath, with or without Margaretha Mahan dry hacking cough 3) swelling in the hands, feet or stomach 4) if you have to sleep on extra pillows at night in order  to breathe.   Complete by: As directed    Call MD for:  difficulty breathing, headache or visual disturbances   Complete by: As directed    Call MD for:  extreme fatigue   Complete by: As directed    Call MD for:  hives   Complete by: As directed    Call MD for:  persistant dizziness or light-headedness   Complete by: As directed    Call MD for:  persistant nausea and vomiting   Complete by: As directed    Call MD for:  redness, tenderness, or signs of infection (pain, swelling, redness, odor or green/yellow discharge around incision site)   Complete by: As directed    Call MD for:  severe uncontrolled pain   Complete by: As directed    Call MD for:  temperature >100.4   Complete by: As directed    Diet - low sodium heart healthy   Complete by: As directed    Discharge instructions   Complete by: As directed    You were seen for shortness of breath.  You've been diagnosed with heart failure.  Your echo showed an ejection fraction of 20-25% (normal is 55-60%).  You had Corde Antonini heart catheterization and cardiology recommended medical therapy.   We'll discharge you home with some new medicines for your heart failure.  We'll start you on entresto, jardiance,  and spironolactone.  Continue our metoprolol.    You should follow up with cardiology as an outpatient as scheduled.    Your TSH is elevated (suggesting underdosing).  Take the synthroid like we discussed (separately from other medications and 30 minutes to 1 hour before breakfast).  Follow these abnormal lab results up with your PCP outpatient.  You'll need repeat thyroid labs and consideration of dose adjustment in Merrillyn Ackerley few weeks.  Return for new, recurrent, or worsening symptoms.  Please ask your PCP to request records from this hospitalization so they know what was done and what the next steps will be.   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; "it makes me bleed" and caused abdominal pain -Pt can take in small amounts(??)   Avelox [moxifloxacin Hcl In Nacl] Nausea And Vomiting, Other (See Comments)   "made me really sick"   Colchicine Diarrhea   Gabapentin Other (See Comments)   Affected balance- no longer takes this   Latex Other (See Comments)   Irritates the skin   Nsaids Other (See Comments)   Avoids due to kidney issues   Penicillins Other (See Comments)   Severe GI upset- occurred in childhood        Medication List     STOP taking these medications    aspirin EC 81 MG tablet   losartan 100 MG tablet Commonly known as: COZAAR   sertraline 25 MG tablet Commonly known as: Zoloft       TAKE these medications    acetaminophen 500 MG tablet Commonly known as: TYLENOL Take 500-1,000 mg by mouth every 6 (six) hours as needed (for pain).   atorvastatin 80 MG tablet Commonly known as: LIPITOR Take 80 mg by mouth every evening.   clonazePAM 0.5 MG tablet Commonly known as: KLONOPIN Take 0.5 mg by mouth daily as needed for anxiety.   clopidogrel 75 MG tablet Commonly known as: PLAVIX Take 1 tablet (75 mg total) by mouth daily.   empagliflozin 10 MG Tabs tablet Commonly known as: JARDIANCE Take 1 tablet (10 mg total) by mouth daily. Start taking on: November 18, 2023   escitalopram 5 MG tablet Commonly known as: LEXAPRO Take 10 mg by mouth daily.   Excedrin Extra Strength 250-250-65 MG tablet Generic drug: aspirin-acetaminophen-caffeine Take 1 tablet by mouth every 6 (six) hours as needed for headache or migraine.   Febuxostat 80 MG Tabs Take 80 mg by mouth every evening.   HYDROcodone-acetaminophen 10-325 MG tablet Commonly known as: NORCO Take 1 tablet by mouth in the morning and at bedtime.   levothyroxine 200 MCG tablet Commonly known as: SYNTHROID Take 200 mcg by mouth at bedtime.   metFORMIN 500 MG 24 hr tablet Commonly known  as: GLUCOPHAGE-XR Take 500 mg by mouth 2 (two) times daily.   metoprolol succinate 50 MG 24 hr tablet Commonly known as: TOPROL-XL Take 1 tablet (50 mg total) by mouth daily. What changed: when to take this   Neo-Synephrine Cold/Allrg Mild 0.25 % nasal spray Generic drug: phenylephrine Place 1 spray into both nostrils every 6 (six) hours as needed for congestion.   nicotine polacrilex 4 MG gum Commonly known as: NICORETTE Take 4 mg by mouth as needed for smoking cessation.   pantoprazole 40 MG tablet Commonly known as: PROTONIX Take 40  mg by mouth at bedtime as needed (for heartburn).   pseudoephedrine 120 MG 12 hr tablet Commonly known as: SUDAFED Take 120 mg by mouth every 12 (twelve) hours as needed for congestion.   sacubitril-valsartan 24-26 MG Commonly known as: ENTRESTO Take 1 tablet by mouth 2 (two) times daily.   spironolactone 25 MG tablet Commonly known as: ALDACTONE Take 0.5 tablets (12.5 mg total) by mouth daily. Start taking on: November 18, 2023   tadalafil 20 MG tablet Commonly known as: CIALIS Take 20 mg by mouth daily as needed for erectile dysfunction.   temazepam 30 MG capsule Commonly known as: RESTORIL Take 30 mg by mouth at bedtime.       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; "it makes me bleed" and caused abdominal pain -Pt can take in small amounts(??)    Avelox [Moxifloxacin Hcl In Nacl] Nausea And Vomiting and Other (See Comments)    "made me really sick"   Colchicine Diarrhea   Gabapentin Other (See Comments)    Affected balance- no longer takes this   Latex Other (See Comments)    Irritates the skin   Nsaids Other (See Comments)    Avoids due to kidney issues   Penicillins Other (See Comments)    Severe GI upset- occurred in childhood      The results of significant diagnostics from this hospitalization (including imaging, microbiology, ancillary and  laboratory) are listed below for reference.    Significant Diagnostic Studies: CARDIAC CATHETERIZATION Result Date: 11/16/2023 Images from the original result were not included. Right & Left Heart Catheterization 11/16/23: Hemodynamic data: Right heart catheterization revealing mild pulmonary hypertension, PA 30/22 with Eveline Sauve mean of 25 mmHg with markedly elevated EDP at 35 mmHg.  There was no pressure gradient across the aortic valve. QP/QS was 1.00.  CO 5.81/CI 2.63, preserved by Fick. Angiographic data: RCA: Dominant.  Gives origin to large PL branch.  The RCA is subtotally occluded in the distal segment with thrombus which appears to be organized.  There is slow filling noted in the large PL branch which is collateralized.  There are faint ipsilateral collaterals from the proximal RCA and also contralateral collaterals collaterals from the LAD.  Mild disease in the proximal to mid segment in the RCA. LM: Very large caliber vessel and very short. LCx: Large-caliber vessel giving origin to large OM 2 which is an ulcerated 90% focal stenosis.  Otherwise Cx is mild disease. LAD: Large-caliber vessel.  Is got mild diffuse disease.  Gives origin to large D1 with Thomas Mabry proximal 40% stenosis. Impression and recommendations: The thrombotic occlusion of the right coronary artery appears to be chronic and organized with collateralization of the distal RCA especially PL branch.  The ulcerated focal stenosis in OM1 although large vessel, overall his symptoms and presentation and severe LV systolic dysfunction noted on the echocardiogram with global hypokinesis does not correlate with underlying CAD.  Hence continued medical therapy with risk modification is indicated.   ECHOCARDIOGRAM COMPLETE Result Date: 11/15/2023    ECHOCARDIOGRAM REPORT   Patient Name:   JESIAH GRISMER  Date of Exam: 11/15/2023 Medical Rec #:  [id number]  Height:       73.0 in Accession #:    [id number] Weight:       214.7 lb Date of Birth:  1966/02/27 BSA:           2.218 m Patient Age:  57 years   BP:           120/71 mmHg Patient Gender: M          HR:           98 bpm. Exam Location:  Inpatient Procedure: 2D Echo, Cardiac Doppler, Color Doppler and Intracardiac            Opacification Agent REPORT CONTAINS CRITICAL RESULT Indications:    I50.40* Unspecified combined systolic (congestive) and diastolic                 (congestive) heart failure  History:        Patient has prior history of Echocardiogram examinations, most                 recent 09/21/2020. CHF and Cardiomyopathy, CAD, Stroke; Risk                 Factors:Hypertension and Diabetes.  Sonographer:    Sheralyn Boatman RDCS Referring Phys: [id number] SUBRINA SUNDIL IMPRESSIONS  1. Left ventricular ejection fraction, by estimation, is 20 to 25%. The left ventricle has severely decreased function. The left ventricle demonstrates global hypokinesis. The left ventricular internal cavity size was moderately dilated. Left ventricular diastolic parameters are indeterminate.  2. Right ventricular systolic function is normal. The right ventricular size is normal. Tricuspid regurgitation signal is inadequate for assessing PA pressure.  3. The mitral valve is normal in structure. Trivial mitral valve regurgitation. No evidence of mitral stenosis.  4. The aortic valve was not well visualized. Aortic valve regurgitation is not visualized. No aortic stenosis is present.  5. The inferior vena cava is normal in size with greater than 50% respiratory variability, suggesting right atrial pressure of 3 mmHg. FINDINGS  Left Ventricle: Left ventricular ejection fraction, by estimation, is 20 to 25%. The left ventricle has severely decreased function. The left ventricle demonstrates global hypokinesis. Definity contrast agent was given IV to delineate the left ventricular endocardial borders. The left ventricular internal cavity size was moderately dilated. There is no left ventricular hypertrophy. Left ventricular diastolic parameters  are indeterminate. Right Ventricle: The right ventricular size is normal. No increase in right ventricular wall thickness. Right ventricular systolic function is normal. Tricuspid regurgitation signal is inadequate for assessing PA pressure. Left Atrium: Left atrial size was normal in size. Right Atrium: Right atrial size was normal in size. Pericardium: There is no evidence of pericardial effusion. Mitral Valve: The mitral valve is normal in structure. Trivial mitral valve regurgitation. No evidence of mitral valve stenosis. Tricuspid Valve: The tricuspid valve is normal in structure. Tricuspid valve regurgitation is trivial. Aortic Valve: The aortic valve was not well visualized. Aortic valve regurgitation is not visualized. No aortic stenosis is present. Pulmonic Valve: The pulmonic valve was not well visualized. Pulmonic valve regurgitation is trivial. Aorta: The aortic root and ascending aorta are structurally normal, with no evidence of dilitation. Venous: The inferior vena cava is normal in size with greater than 50% respiratory variability, suggesting right atrial pressure of 3 mmHg. IAS/Shunts: The interatrial septum was not well visualized.  LEFT VENTRICLE PLAX 2D LVIDd:         6.40 cm      Diastology LVIDs:         5.20 cm      LV e' medial:    4.13 cm/s LV PW:         1.00 cm      LV E/e' medial:  26.2 LV IVS:  0.90 cm      LV e' lateral:   6.42 cm/s LVOT diam:     2.10 cm      LV E/e' lateral: 16.8 LV SV:         47 LV SV Index:   21 LVOT Area:     3.46 cm  LV Volumes (MOD) LV vol d, MOD A2C: 178.0 ml LV vol d, MOD A4C: 136.0 ml LV vol s, MOD A2C: 154.0 ml LV vol s, MOD A4C: 111.0 ml LV SV MOD A2C:     24.0 ml LV SV MOD A4C:     136.0 ml LV SV MOD BP:      15.3 ml RIGHT VENTRICLE             IVC RV S prime:     12.30 cm/s  IVC diam: 2.00 cm TAPSE (M-mode): 2.1 cm LEFT ATRIUM             Index        RIGHT ATRIUM           Index LA diam:        4.90 cm 2.21 cm/m   RA Area:     13.50 cm LA Vol  (A2C):   92.8 ml 41.85 ml/m  RA Volume:   33.60 ml  15.15 ml/m LA Vol (A4C):   33.1 ml 14.93 ml/m LA Biplane Vol: 54.9 ml 24.76 ml/m  AORTIC VALVE LVOT Vmax:   89.70 cm/s LVOT Vmean:  55.800 cm/s LVOT VTI:    0.135 m  AORTA Ao Root diam: 3.10 cm Ao Asc diam:  3.20 cm MITRAL VALVE MV Area (PHT): 5.54 cm     SHUNTS MV Decel Time: 137 msec     Systemic VTI:  0.14 m MV E velocity: 108.00 cm/s  Systemic Diam: 2.10 cm Epifanio Lesches MD Electronically signed by Epifanio Lesches MD Signature Date/Time: 11/15/2023/3:06:26 PM    Final    CT Angio Chest PE W and/or Wo Contrast Result Date: 11/14/2023 CLINICAL DATA:  Pulmonary embolism (PE) suspected, high prob. Shortness of breath EXAM: CT ANGIOGRAPHY CHEST WITH CONTRAST TECHNIQUE: Multidetector CT imaging of the chest was performed using the standard protocol during bolus administration of intravenous contrast. Multiplanar CT image reconstructions and MIPs were obtained to evaluate the vascular anatomy. RADIATION DOSE REDUCTION: This exam was performed according to the departmental dose-optimization program which includes automated exposure control, adjustment of the mA and/or kV according to patient size and/or use of iterative reconstruction technique. CONTRAST:  75mL OMNIPAQUE IOHEXOL 350 MG/ML SOLN COMPARISON:  06/20/2022 FINDINGS: Cardiovascular: No filling defects in the pulmonary arteries to suggest pulmonary emboli. Heart is normal size. Aorta is normal caliber. Scattered coronary artery and aortic calcifications. Mediastinum/Nodes: No mediastinal, hilar, or axillary adenopathy. Trachea and esophagus are unremarkable. Thyroid unremarkable. Lungs/Pleura: Small to moderate bilateral pleural effusions. No confluent airspace opacities. Upper Abdomen: No acute findings Musculoskeletal: Chest wall soft tissues are unremarkable. No acute bony abnormality. Review of the MIP images confirms the above findings. IMPRESSION: No evidence of pulmonary embolus.  Small to moderate bilateral pleural effusions. Scattered coronary artery disease. Aortic Atherosclerosis (ICD10-I70.0). Electronically Signed   By: Charlett Nose M.D.   On: 11/14/2023 18:40   DG Chest 2 View Result Date: 11/14/2023 CLINICAL DATA:  Shortness of breath. EXAM: CHEST - 2 VIEW COMPARISON:  04/02/2013 FINDINGS: The heart size and mediastinal contours are within normal limits. There is no evidence of pulmonary edema, consolidation, pneumothorax, nodule or pleural fluid.  The visualized skeletal structures are unremarkable. IMPRESSION: No active cardiopulmonary disease. Electronically Signed   By: Irish Lack M.D.   On: 11/14/2023 15:01    Microbiology: No results found for this or any previous visit (from the past 240 hours).   Labs: Basic Metabolic Panel: Recent Labs  Lab 11/14/23 1419 11/15/23 0456 11/16/23 0449 11/16/23 1131 11/16/23 1148 11/17/23 0441  NA 133* 136 137 139  139 120* 133*  K 3.7 3.5 3.8 4.2  4.2 3.4* 3.7  CL 101 103 105  --   --  102  CO2 22 24 22   --   --  22  GLUCOSE 166* 131* 115*  --   --  128*  BUN 24* 27* 25*  --   --  24*  CREATININE 1.44* 1.32* 1.35*  --   --  1.07  CALCIUM 9.3 8.8* 8.7*  --   --  8.9  MG  --   --   --   --   --  2.2  PHOS  --   --   --   --   --  3.5   Liver Function Tests: Recent Labs  Lab 11/15/23 0456 11/16/23 0449 11/17/23 0441  AST 19 15 18   ALT 17 15 15   ALKPHOS 62 54 57  BILITOT 0.6 0.5 0.7  PROT 6.8 6.8 7.0  ALBUMIN 3.8 3.5 3.7   No results for input(s): "LIPASE", "AMYLASE" in the last 168 hours. No results for input(s): "AMMONIA" in the last 168 hours. CBC: Recent Labs  Lab 11/14/23 1419 11/15/23 0456 11/16/23 1131 11/16/23 1148 11/17/23 0441  WBC 10.6* 8.3  --   --  7.6  NEUTROABS  --   --   --   --  5.1  HGB 13.0 12.4* 12.6*  12.6* 12.6* 12.7*  HCT 39.3 38.0* 37.0*  37.0* 37.0* 38.9*  MCV 89.3 91.3  --   --  90.5  PLT 340 278  --   --  290   Cardiac Enzymes: No results for input(s):  "CKTOTAL", "CKMB", "CKMBINDEX", "TROPONINI" in the last 168 hours. BNP: BNP (last 3 results) Recent Labs    11/14/23 1419  BNP 1,179.6*    ProBNP (last 3 results) No results for input(s): "PROBNP" in the last 8760 hours.  CBG: Recent Labs  Lab 11/16/23 1234 11/16/23 1624 11/16/23 2022 11/17/23 0733 11/17/23 1122  GLUCAP 139* 144* 119* 136* 162*       Signed:  Lacretia Nicks MD.  [facility] 11/17/2023, 12:55 PM

## 2023-11-18 LAB — LIPOPROTEIN A (LPA): Lipoprotein (a): 158.3 nmol/L — ABNORMAL HIGH (ref <75.0)

## 2023-11-27 ENCOUNTER — Other Ambulatory Visit: Payer: Self-pay

## 2023-11-27 ENCOUNTER — Emergency Department (HOSPITAL_BASED_OUTPATIENT_CLINIC_OR_DEPARTMENT_OTHER): Payer: 59 | Admitting: Radiology

## 2023-11-27 ENCOUNTER — Emergency Department (HOSPITAL_BASED_OUTPATIENT_CLINIC_OR_DEPARTMENT_OTHER): Payer: 59

## 2023-11-27 ENCOUNTER — Ambulatory Visit (HOSPITAL_BASED_OUTPATIENT_CLINIC_OR_DEPARTMENT_OTHER): Payer: 59 | Admitting: Family

## 2023-11-27 ENCOUNTER — Emergency Department (HOSPITAL_BASED_OUTPATIENT_CLINIC_OR_DEPARTMENT_OTHER)
Admission: EM | Admit: 2023-11-27 | Discharge: 2023-11-27 | Disposition: A | Discharge status: Home / Self Care | Payer: 59

## 2023-11-27 ENCOUNTER — Other Ambulatory Visit (HOSPITAL_BASED_OUTPATIENT_CLINIC_OR_DEPARTMENT_OTHER): Payer: Self-pay

## 2023-11-27 ENCOUNTER — Encounter (HOSPITAL_BASED_OUTPATIENT_CLINIC_OR_DEPARTMENT_OTHER): Payer: Self-pay | Admitting: Family

## 2023-11-27 ENCOUNTER — Encounter (HOSPITAL_BASED_OUTPATIENT_CLINIC_OR_DEPARTMENT_OTHER): Payer: Self-pay | Admitting: Emergency Medicine

## 2023-11-27 VITALS — BP 92/68 | HR 86 | Ht 73.0 in | Wt 207.0 lb

## 2023-11-27 DIAGNOSIS — R0989 Other specified symptoms and signs involving the circulatory and respiratory systems: Secondary | ICD-10-CM | POA: Diagnosis not present

## 2023-11-27 DIAGNOSIS — I493 Ventricular premature depolarization: Secondary | ICD-10-CM | POA: Diagnosis not present

## 2023-11-27 DIAGNOSIS — R296 Repeated falls: Secondary | ICD-10-CM | POA: Diagnosis present

## 2023-11-27 DIAGNOSIS — Z91040 Latex allergy status: Secondary | ICD-10-CM | POA: Diagnosis not present

## 2023-11-27 DIAGNOSIS — E119 Type 2 diabetes mellitus without complications: Secondary | ICD-10-CM | POA: Diagnosis not present

## 2023-11-27 DIAGNOSIS — I5022 Chronic systolic (congestive) heart failure: Secondary | ICD-10-CM | POA: Diagnosis not present

## 2023-11-27 DIAGNOSIS — Z8673 Personal history of transient ischemic attack (TIA), and cerebral infarction without residual deficits: Secondary | ICD-10-CM

## 2023-11-27 DIAGNOSIS — E039 Hypothyroidism, unspecified: Secondary | ICD-10-CM | POA: Insufficient documentation

## 2023-11-27 DIAGNOSIS — R270 Ataxia, unspecified: Secondary | ICD-10-CM | POA: Insufficient documentation

## 2023-11-27 DIAGNOSIS — Z79899 Other long term (current) drug therapy: Secondary | ICD-10-CM | POA: Diagnosis not present

## 2023-11-27 DIAGNOSIS — I10 Essential (primary) hypertension: Secondary | ICD-10-CM | POA: Diagnosis not present

## 2023-11-27 DIAGNOSIS — Z720 Tobacco use: Secondary | ICD-10-CM

## 2023-11-27 LAB — URINALYSIS, W/ REFLEX TO CULTURE (INFECTION SUSPECTED)
Bacteria, UA: NONE SEEN
Bilirubin Urine: NEGATIVE
Glucose, UA: >1000 mg/dL — AB
Hgb urine dipstick: NEGATIVE
Ketones, ur: NEGATIVE mg/dL
Leukocytes,Ua: NEGATIVE
Nitrite: NEGATIVE
Protein, ur: 100 mg/dL — AB
Specific Gravity, Urine: 1.029 (ref 1.005–1.030)
pH: 5.5 (ref 5.0–8.0)

## 2023-11-27 LAB — CBC WITH DIFFERENTIAL/PLATELET
Abs Immature Granulocytes: 0.03 10*3/uL (ref 0.00–0.07)
Basophils Absolute: 0 10*3/uL (ref 0.0–0.1)
Basophils Relative: 0 %
Eosinophils Absolute: 0.2 10*3/uL (ref 0.0–0.5)
Eosinophils Relative: 3 %
HCT: 41 % (ref 39.0–52.0)
Hemoglobin: 13.3 g/dL (ref 13.0–17.0)
Immature Granulocytes: 0 %
Lymphocytes Relative: 14 %
Lymphs Abs: 1.1 10*3/uL (ref 0.7–4.0)
MCH: 28.7 pg (ref 26.0–34.0)
MCHC: 32.4 g/dL (ref 30.0–36.0)
MCV: 88.6 fL (ref 80.0–100.0)
Monocytes Absolute: 0.7 10*3/uL (ref 0.1–1.0)
Monocytes Relative: 8 %
Neutro Abs: 6.1 10*3/uL (ref 1.7–7.7)
Neutrophils Relative %: 75 %
Platelets: 267 10*3/uL (ref 150–400)
RBC: 4.63 MIL/uL (ref 4.22–5.81)
RDW: 14.3 % (ref 11.5–15.5)
WBC: 8.2 10*3/uL (ref 4.0–10.5)
nRBC: 0 % (ref 0.0–0.2)

## 2023-11-27 LAB — COMPREHENSIVE METABOLIC PANEL
ALT: 17 U/L (ref 0–44)
AST: 19 U/L (ref 15–41)
Albumin: 4.3 g/dL (ref 3.5–5.0)
Alkaline Phosphatase: 69 U/L (ref 38–126)
Anion gap: 11 (ref 5–15)
BUN: 29 mg/dL — ABNORMAL HIGH (ref 6–20)
CO2: 23 mmol/L (ref 22–32)
Calcium: 9 mg/dL (ref 8.9–10.3)
Chloride: 103 mmol/L (ref 98–111)
Creatinine, Ser: 1.35 mg/dL — ABNORMAL HIGH (ref 0.61–1.24)
GFR, Estimated: >60 mL/min (ref >60)
Glucose, Bld: 158 mg/dL — ABNORMAL HIGH (ref 70–99)
Potassium: 4.4 mmol/L (ref 3.5–5.1)
Sodium: 137 mmol/L (ref 135–145)
Total Bilirubin: 0.5 mg/dL (ref 0.0–1.2)
Total Protein: 8.2 g/dL — ABNORMAL HIGH (ref 6.5–8.1)

## 2023-11-27 LAB — AMMONIA: Ammonia: 12 umol/L (ref 9–35)

## 2023-11-27 LAB — PROTIME-INR
INR: 1 (ref 0.8–1.2)
Prothrombin Time: 13.8 s (ref 11.4–15.2)

## 2023-11-27 LAB — ETHANOL: Alcohol, Ethyl (B): <10 mg/dL (ref <10)

## 2023-11-27 NOTE — Discharge Instructions (Signed)
You have been seen today for your complaint of frequent falls. Your lab work was reassuring. Your imaging was reassuring. Home care instructions are as follows:  Use your walker until your symptoms resolve to prevent falls Follow up with: Your primary care provider.  I have placed an order for home health and home physical therapy to evaluate you.  Follow-up with your primary care provider regarding this if you have not heard back in a few days.  You may also call primary care provider to assist you in admission to a rehab facility if needed. Please seek immediate medical care if you develop any of the following symptoms: Lose consciousness or have trouble moving after a fall. Have a fall that causes a head injury. At this time there does not appear to be the presence of an emergent medical condition, however there is always the potential for conditions to change. Please read and follow the below instructions.  Do not take your medicine if  develop an itchy rash, swelling in your mouth or lips, or difficulty breathing; call 911 and seek immediate emergency medical attention if this occurs.  You may review your lab tests and imaging results in their entirety on your MyChart account.  Please discuss all results of fully with your primary care provider and other specialist at your follow-up visit.  Note: Portions of this text may have been transcribed using voice recognition software. Every effort was made to ensure accuracy; however, inadvertent computerized transcription errors may still be present.

## 2023-11-27 NOTE — ED Triage Notes (Addendum)
Pt bib wheelchair, endorses Falling "25 times", most recent fall last night. Pt endorses falling on tailbone last night, but also reports hitting head x 2 days pta. Reports feeling dizzy. Hx of ataxia. GCS 15. Endorses thinners

## 2023-11-27 NOTE — Patient Instructions (Addendum)
   Remain off Furosemide (Lasix).   Follow up with Dr Cristal Deer in 4 to 6 weeks  GO DOWNSTAIRS TO THE EMERGENCY ROOM FOR EVALUATION

## 2023-11-27 NOTE — Progress Notes (Signed)
Cardiology Office Note:  .   Date:  11/27/2023  ID:  Dustin Sampson, DOB [birth year], MRN [id number] PCP: Jarrett Soho, PA-C  [facility] Providers Cardiologist:  Jodelle Red, MD    History of Present Illness: .   Dustin Sampson is a 58 y.o. male with history of hypertension, hyperlipidemia, DM2, hypothyroidism, PAD s/p bilateral SFA stenting 08/2020, tobacco use, prior CVA, MGUS, PVC, HFrEF, CKD 2A, CAD, large B-cell lymphoma.  Previously abnormal nuclear stress test 2021 with inferior and septal perfusion defect.  LHC deferred by  Mr. Gaida at that time. Echo LVEF 45% at that time which reportedly normalized 06/2022. Prior monitor 2023 revealed 10% PVC burden.  Admitted 1/21 - 11/17/2023 with heart failure exacerbation.  Echo LVEF 20 to 25%.  LHC revealed thrombotic occlusion of RCA which appeared be chronic and ulcerated focal stenosis of OM1.  Recommended for medical therapy of symptoms and severe LV dysfunction did not correlate with underlying CAD.  Discharged on Jardiance, Entresto, metoprolol, spironolactone.  It was not taking levothyroxine appropriately and TSH elevated, recommended to continue present dose and repeat labs in a few weeks to consider dose adjustment.  Presents today for follow-up in wheelchair with his brother. Note he "banged his head" a lot the last week. On returning home from the hospital his wife and son both had flu and strep throat. Since hospital discharge his ataxia has worsened. Reports falling "25 times" since hospital discharge including "banged by head 3 times". Describes it as "an ataxia thing" but no lightheadedness, dizziness, syncope. His father also had ataxia per his report and follows routinely with neurology with no clear answers. Has not sought evaluation for these frequent falls. Has not been checking his blood pressure at home.   Weight on discharge 213 lbs. Weight today 207 lbs. Does note he has been taking Lasix intermittently last took  yesterday. Reviewed that was not recommended on hospital discharge. He took "a couple times".   Notes poor PO intake due to poor appetite. Has been drinking gatorade, discussed recommend avoidance of gatorade due to high sodium content. Reviewed 2L fluid restriction.   ROS:  Review of Systems  Constitutional: Positive for decreased appetite and fever (while having flu, now improved).  Cardiovascular:  Positive for dyspnea on exertion and orthopnea. Negative for chest pain, irregular heartbeat, leg swelling, near-syncope, palpitations, paroxysmal nocturnal dyspnea and syncope.  Respiratory:  Positive for cough, shortness of breath and wheezing.   Musculoskeletal:  Positive for falls.  Neurological:  Positive for loss of balance and weakness ("ataxia").     Studies Reviewed: Marland Kitchen   EKG Interpretation Date/Time:  Monday November 27 2023 09:11:51 EST Ventricular Rate:  86 PR Interval:  160 QRS Duration:  108 QT Interval:  402 QTC Calculation: 481 R Axis:   39  Text Interpretation: Normal sinus rhythm Prolonged QT When compared with ECG of 16-Nov-2023 08:05, Premature ventricular complexes are no longer Present Confirmed by Gillian Shields ([id number]) on 11/27/2023 9:13:32 AM       Risk Assessment/Calculations:             Physical Exam:   VS:  BP 92/68   Pulse (!) 38   Ht 6\' 1"  (1.854 m)   Wt 207 lb (93.9 kg)   SpO2 95%   BMI 27.31 kg/m    Wt Readings from Last 3 Encounters:  11/27/23 207 lb (93.9 kg)  11/17/23 213 lb 8 oz (96.8 kg)  05/04/23 218 lb (98.9 kg)    GEN:  Thin, well developed  NECK: No JVD; No carotid bruits CARDIAC: RRR, no murmurs, rubs, gallops RESPIRATORY:  Clear to auscultation without rales, wheezing. Bilateral lobes with diffuse rhonchi ABDOMEN: Soft, non-tender, non-distended EXTREMITIES:  No edema; No deformity   ASSESSMENT AND PLAN: .    Frequent falls - Reports 25 falls since hospital discharge but no lightheadedness, dizziness. He attributes his falls to  ataxia, prior neurology workup unremarkable.  Due to frequent falls and hitting his head multiple times, would recommend evaluation in the ED. May need HH on hsopital or ED discharge.   Rhonchi in bilateral lobes - Family members with flu and strep throat. Recommend evaluation in ED to assess for possible PNA.   CAD/HLD, LDL goal less than 70/PVD- No chest pain, dyspnea since discharge. CAD out of proportion to his HF. GDMT includes Jardiance, Atorvastatin, Plavix, Metoprolol.   HFrEF - Euvolemic and well compensated on exam. Has been taking Lasix at home, recommend discontinuing. Due to hypotension, will defer escalation of GDMT. Continue Entresto 24-26mg  BID, Spironolactone 12.5mg  daily, Jardiance 10mg  daily, Toprol 50mg  daily. Pending ED eval may need to discontinue Spironolactone if BP remains low.   History of CVA- Continue Atorvastatin, Plavix.   PVC- None noted on EKG today. Continue Metoprolol. Denies palpitations.   DM2- Continue to follow with PCP.         Dispo: follow up in 4-6 weeks with Dr. Cristal Deer  Signed, Alver Sorrow, NP

## 2023-11-27 NOTE — ED Provider Notes (Signed)
Dustin Sampson Arrival date & time: 11/27/23  5621     History  Chief Complaint  Patient presents with   Dustin Sampson is a 58 y.o. male.  With history of hypertension, NIDDM 2, arthritis, anxiety, previous CVA on Plavix, hypothyroidism, hyperlipidemia presenting to the ED for evaluation of frequent falls.  Was admitted to the hospital 11/13/2022 to 11/16/2022 for CHF exacerbation.  Since being discharged from the hospital he has fallen approximately 25 times.  He is typically using his walker when he falls.  States he can fall forward, backwards or to either side.  He denies any prodromal chest pain, abdominal pain, shortness of breath.  He denies any syncope.  No dizziness.  He has struck his head 3 times.  No loss of consciousness.  States his symptoms are due to his ataxia which he has been dealing with for 4 to 5 years.  States he develops the symptoms intermittently, however they typically only last an hour at a time.  He has consistently felt ataxic for the past 10 days.  States his father had similar symptoms for the majority of his life.  He has seen 2 neurologists and has no explanation for his ataxia symptoms.  He denies any alcohol use or recreational drug use.  States he stopped smoking cigarettes 3 days ago.   Fall       Home Medications Prior to Admission medications   Medication Sig Start Date End Date Taking? Authorizing Provider  acetaminophen (TYLENOL) 500 MG tablet Take 500-1,000 mg by mouth every 6 (six) hours as needed (for pain).    [provider]  atorvastatin (LIPITOR) 80 MG tablet Take 80 mg by mouth every evening.    [provider]  clonazePAM (KLONOPIN) 0.5 MG tablet Take 0.5 mg by mouth daily as needed for anxiety.    [provider]  clopidogrel (PLAVIX) 75 MG tablet Take 1 tablet (75 mg total) by mouth daily. 09/28/23   Nada Libman, MD  empagliflozin  (JARDIANCE) 10 MG TABS tablet Take 1 tablet (10 mg total) by mouth daily. 11/18/23 01/17/24  Zigmund Daniel., MD  escitalopram (LEXAPRO) 5 MG tablet Take 10 mg by mouth daily. 05/23/22   [provider]  EXCEDRIN EXTRA STRENGTH [phone] MG tablet Take 1 tablet by mouth every 6 (six) hours as needed for headache or migraine.    [provider]  Febuxostat 80 MG TABS Take 80 mg by mouth every evening.    [provider]  HYDROcodone-acetaminophen (NORCO) 10-325 MG tablet Take 1 tablet by mouth in the morning and at bedtime.    [provider]  levothyroxine (SYNTHROID, LEVOTHROID) 200 MCG tablet Take 200 mcg by mouth at bedtime.    [provider]  metFORMIN (GLUCOPHAGE-XR) 500 MG 24 hr tablet Take 500 mg by mouth 2 (two) times daily.    [provider]  metoprolol succinate (TOPROL-XL) 50 MG 24 hr tablet Take 1 tablet (50 mg total) by mouth daily. Patient taking differently: Take 50 mg by mouth at bedtime. 05/01/23   Jodelle Red, MD  NEO-SYNEPHRINE COLD/ALLRG MILD 0.25 % nasal spray Place 1 spray into both nostrils every 6 (six) hours as needed for congestion.    [provider]  nicotine polacrilex (NICORETTE) 4 MG gum Take 4 mg by mouth as needed for smoking cessation.    [provider]  pantoprazole (PROTONIX) 40  MG tablet Take 40 mg by mouth at bedtime as needed (for heartburn).    [provider]  pseudoephedrine (SUDAFED) 120 MG 12 hr tablet Take 120 mg by mouth every 12 (twelve) hours as needed for congestion.    [provider]  sacubitril-valsartan (ENTRESTO) 24-26 MG Take 1 tablet by mouth 2 (two) times daily. 11/17/23 01/16/24  Zigmund Daniel., MD  spironolactone (ALDACTONE) 25 MG tablet Take 0.5 tablets (12.5 mg total) by mouth daily. 11/18/23 01/17/24  Zigmund Daniel., MD  tadalafil (CIALIS) 20 MG tablet Take 20 mg by mouth daily as needed for erectile dysfunction.      [provider]  temazepam (RESTORIL) 30 MG capsule Take 30 mg by mouth at bedtime.    [provider]      Allergies    Aspirin, Avelox [moxifloxacin hcl in nacl], Colchicine, Gabapentin, Latex, Nsaids, and Penicillins    Review of Systems   Review of Systems  Musculoskeletal:  Positive for back pain.  All other systems reviewed and are negative.   Physical Exam Updated Vital Signs BP (!) 104/50   Pulse 91   Temp (!) 97.5 F (36.4 C)   Resp (!) 21   SpO2 99%  Physical Exam Vitals and nursing note reviewed.  Constitutional:      General: He is not in acute distress.    Appearance: Normal appearance. He is normal weight. He is not ill-appearing.  HENT:     Head: Normocephalic and atraumatic.  Pulmonary:     Effort: Pulmonary effort is normal. No respiratory distress.  Abdominal:     General: Abdomen is flat.  Musculoskeletal:        General: Normal range of motion.     Cervical back: Neck supple.  Skin:    General: Skin is warm and dry.  Neurological:     Mental Status: He is alert and oriented to person, place, and time.     Comments:   MENTAL STATUS: AAOx3   LANG/SPEECH: Fluent, intact naming, repetition & comprehension, slurred speech   CRANIAL NERVES:   II: Pupils equal and reactive   III, IV, VI: EOM intact, no gaze preference or deviation, no nystagmus   V: normal sensation of the face   VII: no facial asymmetry   VIII: normal hearing to speech   MOTOR: 5/5 in both upper and lower extremities   SENSORY: Normal to touch in all extremiteis   COORD: Slight ataxia with finger-to-nose on the right, normal on the left, normal heel to shin and shoulder shrug, no tremor, no dysmetria. No pronator drift   Psychiatric:        Mood and Affect: Mood normal.        Behavior: Behavior normal.     ED Results / Procedures / Treatments   Labs (all labs ordered are listed, but only abnormal results are displayed) Labs Reviewed  COMPREHENSIVE METABOLIC  PANEL - Abnormal; Notable for the following components:      Result Value   Glucose, Bld 158 (*)    BUN 29 (*)    Creatinine, Ser 1.35 (*)    Total Protein 8.2 (*)    All other components within normal limits  URINALYSIS, W/ REFLEX TO CULTURE (INFECTION SUSPECTED) - Abnormal; Notable for the following components:   Glucose, UA >1,000 (*)    Protein, ur 100 (*)    All other components within normal limits  CBC WITH DIFFERENTIAL/PLATELET  AMMONIA  ETHANOL  PROTIME-INR  EKG None  Radiology DG Hips Bilat W or Wo Pelvis 3-4 Views Result Date: 11/27/2023 CLINICAL DATA:  Repeated falls, sacral and bilateral hip pain. EXAM: SACRUM AND COCCYX - 2+ VIEW; DG HIP (WITH OR WITHOUT PELVIS) 3-4V BILAT COMPARISON:  None Available. FINDINGS: Pelvis and hips: Subjectively under mineralized. No evidence of acute or healing/healed fracture. Both femoral heads are well seated in the respective acetabula, no hip dislocation. Pubic rami are intact. Mild left hip joint space narrowing. Mild bilateral acetabular spurring. No erosive change or evidence of avascular necrosis. Bilateral lower extremity vascular stents. Pubic symphysis is congruent. Sacrum and coccyx: No evidence of sacrococcygeal fracture. Sacral ala are maintained. The sacroiliac joints are congruent. Pelvic vascular calcifications are seen. IMPRESSION: 1. No acute fracture of the pelvis, hips, sacrum or coccyx. 2. Mild bilateral hip degenerative change. Electronically Signed   By: Narda Rutherford M.D.   On: 11/27/2023 12:21   DG Sacrum/Coccyx Result Date: 11/27/2023 CLINICAL DATA:  Repeated falls, sacral and bilateral hip pain. EXAM: SACRUM AND COCCYX - 2+ VIEW; DG HIP (WITH OR WITHOUT PELVIS) 3-4V BILAT COMPARISON:  None Available. FINDINGS: Pelvis and hips: Subjectively under mineralized. No evidence of acute or healing/healed fracture. Both femoral heads are well seated in the respective acetabula, no hip dislocation. Pubic rami are intact. Mild  left hip joint space narrowing. Mild bilateral acetabular spurring. No erosive change or evidence of avascular necrosis. Bilateral lower extremity vascular stents. Pubic symphysis is congruent. Sacrum and coccyx: No evidence of sacrococcygeal fracture. Sacral ala are maintained. The sacroiliac joints are congruent. Pelvic vascular calcifications are seen. IMPRESSION: 1. No acute fracture of the pelvis, hips, sacrum or coccyx. 2. Mild bilateral hip degenerative change. Electronically Signed   By: Narda Rutherford M.D.   On: 11/27/2023 12:21   CT Cervical Spine Wo Contrast Result Date: 11/27/2023 CLINICAL DATA:  Multiple falls EXAM: CT CERVICAL SPINE WITHOUT CONTRAST TECHNIQUE: Multidetector CT imaging of the cervical spine was performed without intravenous contrast. Multiplanar CT image reconstructions were also generated. RADIATION DOSE REDUCTION: This exam was performed according to the departmental dose-optimization program which includes automated exposure control, adjustment of the mA and/or kV according to patient size and/or use of iterative reconstruction technique. COMPARISON:  None Available. FINDINGS: Alignment: No traumatic listhesis. Straightening and mild reversal of the normal cervical lordosis. Skull base and vertebrae: No acute fracture. No primary bone lesion or focal pathologic process. Soft tissues and spinal canal: No prevertebral fluid or swelling. No visible canal hematoma. Disc levels: Degenerative changes in the cervical spine. No high-grade spinal canal stenosis. Upper chest: No focal pulmonary opacity or pleural effusion. Emphysema. IMPRESSION: No acute fracture or traumatic listhesis in the cervical spine. Electronically Signed   By: Wiliam Ke M.D.   On: 11/27/2023 12:03   CT Head Wo Contrast Result Date: 11/27/2023 CLINICAL DATA:  Head trauma, coagulopathy EXAM: CT HEAD WITHOUT CONTRAST TECHNIQUE: Contiguous axial images were obtained from the base of the skull through the vertex  without intravenous contrast. RADIATION DOSE REDUCTION: This exam was performed according to the departmental dose-optimization program which includes automated exposure control, adjustment of the mA and/or kV according to patient size and/or use of iterative reconstruction technique. COMPARISON:  02/03/2021 CT head FINDINGS: Brain: No evidence of acute infarction, hemorrhage, mass, mass effect, or midline shift. No hydrocephalus or extra-axial fluid collection. Vascular: No hyperdense vessel. Skull: Negative for fracture or focal lesion. Sinuses/Orbits: No acute finding. Other: The mastoid air cells are well aerated. IMPRESSION: No acute  intracranial process. Electronically Signed   By: Wiliam Ke M.D.   On: 11/27/2023 11:28    Procedures Procedures    Medications Ordered in ED Medications - No data to display  ED Course/ Medical Decision Making/ A&P                                 Medical Decision Making Amount and/or Complexity of Data Reviewed Labs: ordered. Radiology: ordered.  This patient presents to the ED for concern of frequent falls, this involves an extensive number of treatment options, and is a complaint that carries with it a high risk of complications and morbidity.  The differential diagnosis includes CVA, vestibulopathy  My initial workup includes labs, imaging, EKG  Additional history obtained from: Nursing notes from this visit. Previous records within EMR system MRA brain on 11/02/2023 no occlusion or significant stenosis of the visualized proximal intracranial large vessels  I ordered, reviewed and interpreted labs which include: CBC, CMP, ammonia, ethanol, INR, urinalysis.  Urine without evidence of infection.  No leukocytosis or anemia.  Normal INR.  Alcohol level negative.  Ammonia negative.  Hyperglycemia 158.  BUN and creatinine near baseline.  No LFT abnormality or significant electrolyte derangement  I ordered imaging studies including CT head, x-ray of  bilateral hips, sacrum/coccyx I independently visualized and interpreted imaging which showed negative CT head, C-spine, x-ray of bilateral hips, pelvis, sacrum, coccyx I agree with the radiologist interpretation  Cardiac Monitoring:  The patient was maintained on a cardiac monitor.  I personally viewed and interpreted the cardiac monitored which showed an underlying rhythm of: NSR  Afebrile, hemodynamically stable.  58 year old male presenting to the ED for evaluation of frequent falls.  These appear to be mechanical in nature.  He attributes his falls to his ataxia which has been bothering him for the past 4 to 5 years.  No known source of the ataxia.  Recently had an MRI of the brain which was overall negative.  He has mild pain to his coccyx but otherwise denies symptoms.  On initial evaluation he did have some slurred speech which seems to have resolved spontaneously on reevaluation.  He has mild ataxia with the right arm, no other focal neurologic deficits.  Lab workup and imaging negative.  Do not see any indication for admission at this time.  Had a shared decision-making conversation with the patient regarding preferred management.  He strongly prefers to be discharged home.  I encouraged him to use his wheelchair and to limit his ambulation until his symptoms resolve.  I placed an ambulatory referral to home health.  He was encouraged to follow-up with his primary care provider soon as possible.  He was also informed that he may speak with his primary care provider regarding admission to a rehab facility but does not want this at this time.  He was given strict return precautions.  Stable at discharge.  At this time there does not appear to be any evidence of an acute emergency medical condition and the patient appears stable for discharge with appropriate outpatient follow up. Diagnosis was discussed with patient who verbalizes understanding of care plan and is agreeable to discharge. I have  discussed return precautions with patient who verbalizes understanding. Patient encouraged to follow-up with their PCP within 3 days. All questions answered.  Patient's case discussed with Dr. Maple Hudson who agrees with plan to discharge with follow-up.   Note: Portions of  this report may have been transcribed using voice recognition software. Every effort was made to ensure accuracy; however, inadvertent computerized transcription errors may still be present.         Final Clinical Impression(s) / ED Diagnoses Final diagnoses:  Frequent falls  Ataxia    Rx / DC Orders ED Discharge Orders          Ordered    Home Health        11/27/23 1250    Face-to-face encounter (required for Medicare/Medicaid patients)       Comments: I Michelle Piper certify that this patient is under my care and that I, or a nurse practitioner or physician's assistant working with me, had a face-to-face encounter that meets the physician face-to-face encounter requirements with this patient on 11/27/2023. The encounter with the patient was in whole, or in part for the following medical condition(s) which is the primary reason for home health care (List medical condition): ataxia, frequent falls   11/27/23 1250              Michelle Piper, PA-C 11/27/23 1257    Coral Spikes, DO 11/27/23 [phone]

## 2023-12-04 ENCOUNTER — Telehealth (HOSPITAL_BASED_OUTPATIENT_CLINIC_OR_DEPARTMENT_OTHER): Payer: 59 | Admitting: Cardiology

## 2023-12-04 NOTE — Telephone Encounter (Signed)
 Called and spoke to patient. Informed patient that copay card will be left at front desk for pick up at earliest convenience. He verbalized understanding.

## 2023-12-04 NOTE — Telephone Encounter (Signed)
 Pt c/o medication issue:  1. Name of Medication:   sacubitril -valsartan  (ENTRESTO ) 24-26 MG  empagliflozin  (JARDIANCE ) 10 MG TABS tablet   2. How are you currently taking this medication (dosage and times per day)? As prescribed  3. Are you having a reaction (difficulty breathing--STAT)? No   4. What is your medication issue? Patient is calling requesting assistance with the cost of these medications. He reports the hospital advised him to reach out to the office regarding this. Please advise.

## 2023-12-18 ENCOUNTER — Other Ambulatory Visit (HOSPITAL_COMMUNITY): Payer: Self-pay

## 2023-12-19 ENCOUNTER — Emergency Department (HOSPITAL_COMMUNITY): Payer: 59

## 2023-12-19 ENCOUNTER — Encounter (HOSPITAL_COMMUNITY): Payer: Self-pay | Admitting: Emergency Medicine

## 2023-12-19 ENCOUNTER — Other Ambulatory Visit: Payer: Self-pay

## 2023-12-19 ENCOUNTER — Inpatient Hospital Stay (HOSPITAL_COMMUNITY)
Admission: EM | Admit: 2023-12-19 | Discharge: 2023-12-25 | DRG: 083 | Disposition: A | Discharge status: Rehabilitation Facility | Payer: 59 | Attending: Internal Medicine | Admitting: Internal Medicine

## 2023-12-19 DIAGNOSIS — S065XAA Traumatic subdural hemorrhage with loss of consciousness status unknown, initial encounter: Principal | ICD-10-CM | POA: Diagnosis present

## 2023-12-19 DIAGNOSIS — F411 Generalized anxiety disorder: Secondary | ICD-10-CM | POA: Diagnosis present

## 2023-12-19 DIAGNOSIS — Z888 Allergy status to other drugs, medicaments and biological substances status: Secondary | ICD-10-CM

## 2023-12-19 DIAGNOSIS — Z79890 Hormone replacement therapy (postmenopausal): Secondary | ICD-10-CM

## 2023-12-19 DIAGNOSIS — Z7902 Long term (current) use of antithrombotics/antiplatelets: Secondary | ICD-10-CM

## 2023-12-19 DIAGNOSIS — F419 Anxiety disorder, unspecified: Secondary | ICD-10-CM | POA: Diagnosis present

## 2023-12-19 DIAGNOSIS — E039 Hypothyroidism, unspecified: Secondary | ICD-10-CM | POA: Diagnosis present

## 2023-12-19 DIAGNOSIS — L219 Seborrheic dermatitis, unspecified: Secondary | ICD-10-CM | POA: Diagnosis present

## 2023-12-19 DIAGNOSIS — Z800 Family history of malignant neoplasm of digestive organs: Secondary | ICD-10-CM

## 2023-12-19 DIAGNOSIS — I5042 Chronic combined systolic (congestive) and diastolic (congestive) heart failure: Secondary | ICD-10-CM | POA: Diagnosis present

## 2023-12-19 DIAGNOSIS — Y92009 Unspecified place in unspecified non-institutional (private) residence as the place of occurrence of the external cause: Secondary | ICD-10-CM

## 2023-12-19 DIAGNOSIS — R402142 Coma scale, eyes open, spontaneous, at arrival to emergency department: Secondary | ICD-10-CM | POA: Diagnosis present

## 2023-12-19 DIAGNOSIS — R402362 Coma scale, best motor response, obeys commands, at arrival to emergency department: Secondary | ICD-10-CM | POA: Diagnosis present

## 2023-12-19 DIAGNOSIS — R296 Repeated falls: Secondary | ICD-10-CM | POA: Diagnosis not present

## 2023-12-19 DIAGNOSIS — Z7984 Long term (current) use of oral hypoglycemic drugs: Secondary | ICD-10-CM

## 2023-12-19 DIAGNOSIS — R402252 Coma scale, best verbal response, oriented, at arrival to emergency department: Secondary | ICD-10-CM | POA: Diagnosis present

## 2023-12-19 DIAGNOSIS — R270 Ataxia, unspecified: Secondary | ICD-10-CM | POA: Diagnosis not present

## 2023-12-19 DIAGNOSIS — I10 Essential (primary) hypertension: Secondary | ICD-10-CM | POA: Diagnosis present

## 2023-12-19 DIAGNOSIS — Z881 Allergy status to other antibiotic agents status: Secondary | ICD-10-CM

## 2023-12-19 DIAGNOSIS — Z79899 Other long term (current) drug therapy: Secondary | ICD-10-CM

## 2023-12-19 DIAGNOSIS — I5022 Chronic systolic (congestive) heart failure: Secondary | ICD-10-CM | POA: Diagnosis present

## 2023-12-19 DIAGNOSIS — E785 Hyperlipidemia, unspecified: Secondary | ICD-10-CM | POA: Diagnosis present

## 2023-12-19 DIAGNOSIS — S43015A Anterior dislocation of left humerus, initial encounter: Secondary | ICD-10-CM | POA: Diagnosis present

## 2023-12-19 DIAGNOSIS — S022XXB Fracture of nasal bones, initial encounter for open fracture: Principal | ICD-10-CM | POA: Diagnosis present

## 2023-12-19 DIAGNOSIS — D472 Monoclonal gammopathy: Secondary | ICD-10-CM | POA: Diagnosis present

## 2023-12-19 DIAGNOSIS — Z8673 Personal history of transient ischemic attack (TIA), and cerebral infarction without residual deficits: Secondary | ICD-10-CM

## 2023-12-19 DIAGNOSIS — Z8249 Family history of ischemic heart disease and other diseases of the circulatory system: Secondary | ICD-10-CM

## 2023-12-19 DIAGNOSIS — E119 Type 2 diabetes mellitus without complications: Secondary | ICD-10-CM

## 2023-12-19 DIAGNOSIS — I739 Peripheral vascular disease, unspecified: Secondary | ICD-10-CM | POA: Diagnosis present

## 2023-12-19 DIAGNOSIS — W010XXA Fall on same level from slipping, tripping and stumbling without subsequent striking against object, initial encounter: Secondary | ICD-10-CM | POA: Diagnosis present

## 2023-12-19 DIAGNOSIS — Z23 Encounter for immunization: Secondary | ICD-10-CM

## 2023-12-19 DIAGNOSIS — Z801 Family history of malignant neoplasm of trachea, bronchus and lung: Secondary | ICD-10-CM

## 2023-12-19 DIAGNOSIS — G4700 Insomnia, unspecified: Secondary | ICD-10-CM | POA: Diagnosis present

## 2023-12-19 DIAGNOSIS — S20212A Contusion of left front wall of thorax, initial encounter: Secondary | ICD-10-CM | POA: Diagnosis present

## 2023-12-19 DIAGNOSIS — B372 Candidiasis of skin and nail: Secondary | ICD-10-CM | POA: Diagnosis present

## 2023-12-19 DIAGNOSIS — Z880 Allergy status to penicillin: Secondary | ICD-10-CM

## 2023-12-19 DIAGNOSIS — Z886 Allergy status to analgesic agent status: Secondary | ICD-10-CM

## 2023-12-19 DIAGNOSIS — Z91040 Latex allergy status: Secondary | ICD-10-CM

## 2023-12-19 DIAGNOSIS — F17210 Nicotine dependence, cigarettes, uncomplicated: Secondary | ICD-10-CM | POA: Diagnosis present

## 2023-12-19 DIAGNOSIS — E1151 Type 2 diabetes mellitus with diabetic peripheral angiopathy without gangrene: Secondary | ICD-10-CM | POA: Diagnosis present

## 2023-12-19 DIAGNOSIS — Z8489 Family history of other specified conditions: Secondary | ICD-10-CM

## 2023-12-19 DIAGNOSIS — I110 Hypertensive heart disease with heart failure: Secondary | ICD-10-CM | POA: Diagnosis present

## 2023-12-19 LAB — BASIC METABOLIC PANEL
Anion gap: 7 (ref 5–15)
BUN: 16 mg/dL (ref 6–20)
CO2: 23 mmol/L (ref 22–32)
Calcium: 9.1 mg/dL (ref 8.9–10.3)
Chloride: 104 mmol/L (ref 98–111)
Creatinine, Ser: 1.24 mg/dL (ref 0.61–1.24)
GFR, Estimated: >60 mL/min (ref >60)
Glucose, Bld: 192 mg/dL — ABNORMAL HIGH (ref 70–99)
Potassium: 4.8 mmol/L (ref 3.5–5.1)
Sodium: 134 mmol/L — ABNORMAL LOW (ref 135–145)

## 2023-12-19 LAB — CBC
HCT: 42.5 % (ref 39.0–52.0)
Hemoglobin: 14.4 g/dL (ref 13.0–17.0)
MCH: 29.4 pg (ref 26.0–34.0)
MCHC: 33.9 g/dL (ref 30.0–36.0)
MCV: 86.7 fL (ref 80.0–100.0)
Platelets: 250 10*3/uL (ref 150–400)
RBC: 4.9 MIL/uL (ref 4.22–5.81)
RDW: 13.8 % (ref 11.5–15.5)
WBC: 12.6 10*3/uL — ABNORMAL HIGH (ref 4.0–10.5)
nRBC: 0 % (ref 0.0–0.2)

## 2023-12-19 LAB — CBG MONITORING, ED: Glucose-Capillary: 117 mg/dL — ABNORMAL HIGH (ref 70–99)

## 2023-12-19 MED ORDER — FENTANYL CITRATE PF 50 MCG/ML IJ SOSY
50.0000 ug | PREFILLED_SYRINGE | Freq: Once | INTRAMUSCULAR | Status: AC
  Administered 2023-12-19: 50 ug via INTRAVENOUS
  Filled 2023-12-19: qty 1

## 2023-12-19 MED ORDER — SODIUM CHLORIDE 0.9 % IV BOLUS
500.0000 mL | Freq: Once | INTRAVENOUS | Status: AC
  Administered 2023-12-19: 500 mL via INTRAVENOUS

## 2023-12-19 MED ORDER — PROPOFOL 10 MG/ML IV BOLUS
0.5000 mg/kg | Freq: Once | INTRAVENOUS | Status: DC
  Filled 2023-12-19: qty 20

## 2023-12-19 MED ORDER — HYDROCODONE-ACETAMINOPHEN 5-325 MG PO TABS: 1.0000 | ORAL_TABLET | Freq: Once | ORAL | Status: DC

## 2023-12-19 MED ORDER — POLYETHYLENE GLYCOL 3350 17 G PO PACK: 17.0000 g | PACK | Freq: Every day | ORAL | Status: DC | PRN

## 2023-12-19 MED ORDER — TETANUS-DIPHTH-ACELL PERTUSSIS 5-2.5-18.5 LF-MCG/0.5 IM SUSY
0.5000 mL | PREFILLED_SYRINGE | Freq: Once | INTRAMUSCULAR | Status: AC
  Administered 2023-12-19: 0.5 mL via INTRAMUSCULAR
  Filled 2023-12-19: qty 0.5

## 2023-12-19 MED ORDER — PROPOFOL 10 MG/ML IV BOLUS
INTRAVENOUS | Status: AC | PRN
Start: 2023-12-19 — End: 2023-12-19
  Administered 2023-12-19: 50 mg via INTRAVENOUS

## 2023-12-19 MED ORDER — OXYCODONE HCL 5 MG PO TABS
5.0000 mg | ORAL_TABLET | ORAL | Status: DC | PRN
  Administered 2023-12-20 – 2023-12-25 (×9): 5 mg via ORAL
  Filled 2023-12-19 (×9): qty 1

## 2023-12-19 MED ORDER — CEPHALEXIN 250 MG PO CAPS
500.0000 mg | ORAL_CAPSULE | Freq: Once | ORAL | Status: AC
  Administered 2023-12-19: 500 mg via ORAL
  Filled 2023-12-19: qty 2

## 2023-12-19 MED ORDER — CEPHALEXIN 500 MG PO CAPS: 500.0000 mg | ORAL_CAPSULE | Freq: Four times a day (QID) | ORAL | 0 refills | Status: DC

## 2023-12-19 MED ORDER — EMPAGLIFLOZIN 10 MG PO TABS
10.0000 mg | ORAL_TABLET | Freq: Every day | ORAL | Status: DC
  Administered 2023-12-20 – 2023-12-25 (×6): 10 mg via ORAL
  Filled 2023-12-19 (×6): qty 1

## 2023-12-19 MED ORDER — MORPHINE SULFATE (PF) 4 MG/ML IV SOLN
4.0000 mg | Freq: Once | INTRAVENOUS | Status: AC
  Administered 2023-12-19: 4 mg via INTRAVENOUS
  Filled 2023-12-19: qty 1

## 2023-12-19 MED ORDER — SODIUM CHLORIDE 0.9% FLUSH
3.0000 mL | Freq: Two times a day (BID) | INTRAVENOUS | Status: DC
  Administered 2023-12-19 – 2023-12-25 (×12): 3 mL via INTRAVENOUS

## 2023-12-19 MED ORDER — OXYCODONE-ACETAMINOPHEN 5-325 MG PO TABS
1.0000 | ORAL_TABLET | Freq: Once | ORAL | Status: AC
  Administered 2023-12-19: 1 via ORAL
  Filled 2023-12-19: qty 1

## 2023-12-19 MED ORDER — OXYMETAZOLINE HCL 0.05 % NA SOLN
2.0000 | Freq: Once | NASAL | Status: AC
  Administered 2023-12-19: 2 via NASAL
  Filled 2023-12-19: qty 30

## 2023-12-19 MED ORDER — CLONAZEPAM 0.5 MG PO TABS
0.5000 mg | ORAL_TABLET | Freq: Every day | ORAL | Status: DC | PRN
  Administered 2023-12-20 – 2023-12-21 (×2): 0.5 mg via ORAL
  Filled 2023-12-19 (×2): qty 1

## 2023-12-19 MED ORDER — LACTATED RINGERS IV BOLUS
1000.0000 mL | Freq: Once | INTRAVENOUS | Status: AC
  Administered 2023-12-19: 1000 mL via INTRAVENOUS

## 2023-12-19 MED ORDER — INSULIN ASPART 100 UNIT/ML IJ SOLN
0.0000 [IU] | Freq: Every day | INTRAMUSCULAR | Status: DC
  Administered 2023-12-23: 2 [IU] via SUBCUTANEOUS

## 2023-12-19 MED ORDER — TEMAZEPAM 7.5 MG PO CAPS
30.0000 mg | ORAL_CAPSULE | Freq: Every day | ORAL | Status: DC
  Administered 2023-12-19 – 2023-12-24 (×6): 30 mg via ORAL
  Filled 2023-12-19 (×4): qty 4
  Filled 2023-12-19: qty 2
  Filled 2023-12-19: qty 4

## 2023-12-19 MED ORDER — LEVOTHYROXINE SODIUM 100 MCG PO TABS
200.0000 ug | ORAL_TABLET | Freq: Every day | ORAL | Status: DC
  Administered 2023-12-19 – 2023-12-24 (×6): 200 ug via ORAL
  Filled 2023-12-19: qty 4
  Filled 2023-12-19 (×2): qty 2
  Filled 2023-12-19 (×2): qty 4
  Filled 2023-12-19 (×2): qty 2

## 2023-12-19 MED ORDER — HYDROMORPHONE HCL 1 MG/ML IJ SOLN
0.5000 mg | INTRAMUSCULAR | Status: DC | PRN
  Administered 2023-12-19 – 2023-12-25 (×15): 0.5 mg via INTRAVENOUS
  Filled 2023-12-19: qty 0.5
  Filled 2023-12-19: qty 1
  Filled 2023-12-19 (×2): qty 0.5
  Filled 2023-12-19: qty 1
  Filled 2023-12-19 (×8): qty 0.5
  Filled 2023-12-19: qty 1
  Filled 2023-12-19: qty 0.5

## 2023-12-19 MED ORDER — ONDANSETRON HCL 4 MG/2ML IJ SOLN
4.0000 mg | Freq: Once | INTRAMUSCULAR | Status: AC
  Administered 2023-12-19: 4 mg via INTRAVENOUS
  Filled 2023-12-19: qty 2

## 2023-12-19 MED ORDER — FEBUXOSTAT 40 MG PO TABS
80.0000 mg | ORAL_TABLET | Freq: Every evening | ORAL | Status: DC
  Administered 2023-12-19 – 2023-12-24 (×6): 80 mg via ORAL
  Filled 2023-12-19 (×7): qty 2

## 2023-12-19 MED ORDER — INSULIN ASPART 100 UNIT/ML IJ SOLN
0.0000 [IU] | Freq: Three times a day (TID) | INTRAMUSCULAR | Status: DC
  Administered 2023-12-21 (×2): 1 [IU] via SUBCUTANEOUS
  Administered 2023-12-22 (×2): 3 [IU] via SUBCUTANEOUS
  Administered 2023-12-22: 1 [IU] via SUBCUTANEOUS

## 2023-12-19 MED ORDER — LIDOCAINE HCL 2 % IJ SOLN
5.0000 mL | Freq: Once | INTRAMUSCULAR | Status: AC
  Administered 2023-12-19: 100 mg
  Filled 2023-12-19: qty 20

## 2023-12-19 MED ORDER — GABAPENTIN 300 MG PO CAPS
300.0000 mg | ORAL_CAPSULE | Freq: Three times a day (TID) | ORAL | Status: DC
  Filled 2023-12-19: qty 1

## 2023-12-19 MED ORDER — SACUBITRIL-VALSARTAN 24-26 MG PO TABS
1.0000 | ORAL_TABLET | Freq: Two times a day (BID) | ORAL | Status: DC
Start: 2023-12-19 — End: 2023-12-25
  Administered 2023-12-19 – 2023-12-25 (×12): 1 via ORAL
  Filled 2023-12-19 (×12): qty 1

## 2023-12-19 MED ORDER — LIDOCAINE 4 % EX PTCH: 1.0000 | MEDICATED_PATCH | CUTANEOUS | 1 refills | Status: DC

## 2023-12-19 MED ORDER — ACETAMINOPHEN 650 MG RE SUPP: 650.0000 mg | Freq: Four times a day (QID) | RECTAL | Status: DC | PRN

## 2023-12-19 MED ORDER — PRAVASTATIN SODIUM 40 MG PO TABS
80.0000 mg | ORAL_TABLET | Freq: Every day | ORAL | Status: DC
  Administered 2023-12-20 – 2023-12-25 (×6): 80 mg via ORAL
  Filled 2023-12-19 (×6): qty 2

## 2023-12-19 MED ORDER — SPIRONOLACTONE 12.5 MG HALF TABLET
12.5000 mg | ORAL_TABLET | Freq: Every day | ORAL | Status: DC
  Administered 2023-12-20 – 2023-12-25 (×6): 12.5 mg via ORAL
  Filled 2023-12-19 (×7): qty 1

## 2023-12-19 MED ORDER — ACETAMINOPHEN 325 MG PO TABS
650.0000 mg | ORAL_TABLET | Freq: Four times a day (QID) | ORAL | Status: DC | PRN
  Administered 2023-12-20: 650 mg via ORAL
  Filled 2023-12-19: qty 2

## 2023-12-19 MED ORDER — METOPROLOL SUCCINATE ER 50 MG PO TB24
50.0000 mg | ORAL_TABLET | Freq: Every day | ORAL | Status: DC
Start: 2023-12-19 — End: 2023-12-25
  Administered 2023-12-19 – 2023-12-24 (×6): 50 mg via ORAL
  Filled 2023-12-19 (×3): qty 1
  Filled 2023-12-19: qty 2
  Filled 2023-12-19 (×2): qty 1

## 2023-12-19 MED ORDER — OXYCODONE HCL 5 MG PO TABS
5.0000 mg | ORAL_TABLET | Freq: Once | ORAL | Status: AC
  Administered 2023-12-19: 5 mg via ORAL
  Filled 2023-12-19: qty 1

## 2023-12-19 NOTE — Discharge Instructions (Addendum)
 It did appear that you have a small bleed on your CT scan.  This was stable on repeat scanning.  Please hold your Plavix for 1 week.  It does appear that you have a nasal bone fracture.  Please follow-up in 1 week to have this reevaluated and to see if surgery is warranted or needed.  Please take the Keflex which is an antibiotic to prevent infections.  Do not blow your nose as this can cause issues until you are able to follow-up with the ENT doctor.  Please keep your arm in the sling and follow-up with the orthopedic surgeon at the number provided.  Please follow-up with your doctor or the ear nose and throat doctor to have the sutures removed in 1 week.  Please take your home hydrocodone for pain.  He may also try the lidocaine patches over the ribs see if this helps.  Return to the ER for worsening symptoms.

## 2023-12-19 NOTE — ED Triage Notes (Signed)
 Standing fall on left side with blood thinners. Lac to nose, deformed left shoulder.

## 2023-12-19 NOTE — H&P (Signed)
 Dustin Sampson

## 2023-12-19 NOTE — ED Provider Notes (Signed)
 Care of patient received from prior provider at 7:45 PM, please see their note for complete H/P and care plan.  Received handoff per ED course.  Clinical Course as of 12/19/23 2010  Tue Dec 19, 2023  1505 Stable HO ART Repeat CTH at 6pm. Fall on Plavix. Shoulder fixed per prior provider. Lacs fixed. If 2nd ct negative ok to go.  [CC]  2002 I went to evaluate at bedside. His repeat CT scan does not show any focal pathology however he endorses severe lightheadedness and near syncope.  He has a history of cerebral ataxia and thinks that is the cause of the episode today that made him fall on the rug.  However he did not even attempt to catch himself so I am hesitant.  He hit his head hard enough to cause a subdural and did not even attempt to catch himself per bystander report via EMS earlier in the day. Had a 6-hour stability scan in the ER.  No other traumatic injuries.  However I believe patient would warrant evaluation for this near syncopal event with no attempt to catch himself.  He will likely require placement as he is not stable on his feet after his most recent fall and is requesting evaluation for possible physical therapy rehabilitation. [CC]    Clinical Course User Index [CC] Glyn Ade, MD   CRITICAL CARE Performed by: Glyn Ade  ?  Total critical care time: 45 minutes  Critical care time was exclusive of separately billable procedures and treating other patients.  Critical care was necessary to treat or prevent imminent or life-threatening deterioration.  Critical care was time spent personally by me on the following activities: development of treatment plan with patient and/or surrogate as well as nursing, discussions with consultants, evaluation of patient's response to treatment, examination of patient, obtaining history from patient or surrogate, ordering and performing treatments and interventions, ordering and review of laboratory studies, ordering and review  of radiographic studies, pulse oximetry and re-evaluation of patient's condition.  Disposition:   Based on the above findings, I believe this patient is stable for admission.    Patient/family educated about specific findings on our evaluation and explained exact reasons for admission.  Patient/family educated about clinical situation and time was allowed to answer questions.   Admission team communicated with and agreed with need for admission. Patient admitted. Patient  ready to move at this time.     Emergency Department Medication Summary:   Medications  febuxostat (ULORIC) tablet 80 mg (80 mg Oral Given 12/20/23 1734)  metoprolol succinate (TOPROL-XL) 24 hr tablet 50 mg (50 mg Oral Given 12/20/23 2114)  pravastatin (PRAVACHOL) tablet 80 mg (80 mg Oral Given 12/21/23 0847)  sacubitril-valsartan (ENTRESTO) 24-26 mg per tablet (1 tablet Oral Given 12/21/23 0847)  spironolactone (ALDACTONE) tablet 12.5 mg (12.5 mg Oral Given 12/21/23 0848)  temazepam (RESTORIL) capsule 30 mg (30 mg Oral Given 12/20/23 2113)  empagliflozin (JARDIANCE) tablet 10 mg (10 mg Oral Given 12/21/23 0847)  levothyroxine (SYNTHROID) tablet 200 mcg (200 mcg Oral Given 12/20/23 2114)  clonazePAM (KLONOPIN) tablet 0.5 mg (0.5 mg Oral Given 12/20/23 1737)  sodium chloride flush (NS) 0.9 % injection 3 mL (3 mLs Intravenous Given 12/21/23 0847)  acetaminophen (TYLENOL) tablet 650 mg (650 mg Oral Given 12/20/23 0746)    Or  acetaminophen (TYLENOL) suppository 650 mg ( Rectal See Alternative 12/20/23 0746)  oxyCODONE (Oxy IR/ROXICODONE) immediate release tablet 5 mg (5 mg Oral Given 12/21/23 0222)  HYDROmorphone (DILAUDID) injection 0.5  mg (0.5 mg Intravenous Given 12/21/23 0935)  insulin aspart (novoLOG) injection 0-6 Units (1 Units Subcutaneous Given 12/21/23 1237)  insulin aspart (novoLOG) injection 0-5 Units ( Subcutaneous Not Given 12/20/23 2146)  cephALEXin (KEFLEX) capsule 500 mg (500 mg Oral Given 12/21/23 1357)  metoprolol  tartrate (LOPRESSOR) injection 2.5 mg (2.5 mg Intravenous Given 12/20/23 1817)  benzonatate (TESSALON) capsule 200 mg (200 mg Oral Given 12/21/23 0550)  oxyCODONE-acetaminophen (PERCOCET) 7.5-325 MG per tablet 1 tablet (1 tablet Oral Given 12/21/23 1610)  polyethylene glycol (MIRALAX / GLYCOLAX) packet 17 g (17 g Oral Given 12/21/23 1000)  senna-docusate (Senokot-S) tablet 2 tablet (2 tablets Oral Given 12/21/23 1000)  oxymetazoline (AFRIN) 0.05 % nasal spray 2 spray (2 sprays Each Nare Given 12/19/23 1115)  Tdap (BOOSTRIX) injection 0.5 mL (0.5 mLs Intramuscular Given 12/19/23 1234)  morphine (PF) 4 MG/ML injection 4 mg (4 mg Intravenous Given 12/19/23 1114)  ondansetron (ZOFRAN) injection 4 mg (4 mg Intravenous Given 12/19/23 1114)  sodium chloride 0.9 % bolus 500 mL (0 mLs Intravenous Stopped 12/19/23 1431)  fentaNYL (SUBLIMAZE) injection 50 mcg (50 mcg Intravenous Given 12/19/23 1230)  lidocaine (XYLOCAINE) 2 % (with pres) injection 100 mg (100 mg Infiltration Given 12/19/23 1239)  propofol (DIPRIVAN) 10 mg/mL bolus/IV push (50 mg Intravenous Given 12/19/23 1304)  oxyCODONE-acetaminophen (PERCOCET/ROXICET) 5-325 MG per tablet 1 tablet (1 tablet Oral Given 12/19/23 1505)  cephALEXin (KEFLEX) capsule 500 mg (500 mg Oral Given 12/19/23 1505)  oxyCODONE (Oxy IR/ROXICODONE) immediate release tablet 5 mg (5 mg Oral Given 12/19/23 2120)  lactated ringers bolus 1,000 mL (0 mLs Intravenous Stopped 12/20/23 1341)  furosemide (LASIX) injection 40 mg (40 mg Intravenous Given 12/20/23 1805)            Glyn Ade, MD 12/21/23 1614

## 2023-12-19 NOTE — Progress Notes (Signed)
 58 year old male status post mechanical fall without significant loss of consciousness.  No neurologic deficits on examination.  CT scan demonstrates trace parafalcine subdural hematoma between his frontal lobes.  No mass effect.  No evidence of parenchymal injury.  Patient does take Plavix.  Trace interhemispheric subdural hematoma without mass effect.  Recommend follow-up head CT scan in 6 hours.  If stable patient okay for discharge home.  Patient should hold Plavix for 1 week.

## 2023-12-19 NOTE — ED Provider Notes (Signed)
 [place] EMERGENCY DEPARTMENT AT [facility] Provider Note   CSN: [id number] Arrival date & time: 12/19/23  0941     History  Chief Complaint  Patient presents with   Dustin Sampson is a 58 y.o. male.  58 year old man with past medical history of hypertension and hyperlipidemia who is on Plavix presenting to the emergency department today after a fall at home.  The patient states that he tripped and fell after losing his footing.  He fell and struck his face.  He also is complaining of pain on his left side.  He denies any associated abdominal pain.  This happened around 5:45 AM.  He states that he was having some bleeding from his nose but this seems to have resolved.   Fall       Home Medications Prior to Admission medications   Medication Sig Start Date End Date Taking? Authorizing Provider  cephALEXin (KEFLEX) 500 MG capsule Take 1 capsule (500 mg total) by mouth 4 (four) times daily. 12/19/23  Yes Durwin Glaze, MD  lidocaine (HM LIDOCAINE PATCH) 4 % Place 1 patch onto the skin daily. 12/19/23  Yes Durwin Glaze, MD  acetaminophen (TYLENOL) 500 MG tablet Take 500-1,000 mg by mouth every 6 (six) hours as needed (for pain).    [provider]  atorvastatin (LIPITOR) 80 MG tablet Take 80 mg by mouth every evening.    [provider]  clonazePAM (KLONOPIN) 0.5 MG tablet Take 0.5 mg by mouth daily as needed for anxiety.    [provider]  clopidogrel (PLAVIX) 75 MG tablet Take 1 tablet (75 mg total) by mouth daily. 09/28/23   Nada Libman, MD  empagliflozin (JARDIANCE) 10 MG TABS tablet Take 1 tablet (10 mg total) by mouth daily. 11/18/23 01/17/24  Zigmund Daniel., MD  escitalopram (LEXAPRO) 5 MG tablet Take 10 mg by mouth daily. 05/23/22   [provider]  EXCEDRIN EXTRA STRENGTH [phone] MG tablet Take 1 tablet by mouth every 6 (six) hours as needed for headache or migraine.    [provider]  Febuxostat 80  MG TABS Take 80 mg by mouth every evening.    [provider]  HYDROcodone-acetaminophen (NORCO) 10-325 MG tablet Take 1 tablet by mouth in the morning and at bedtime.    [provider]  levothyroxine (SYNTHROID, LEVOTHROID) 200 MCG tablet Take 200 mcg by mouth at bedtime.    [provider]  metFORMIN (GLUCOPHAGE-XR) 500 MG 24 hr tablet Take 500 mg by mouth 2 (two) times daily.    [provider]  metoprolol succinate (TOPROL-XL) 50 MG 24 hr tablet Take 1 tablet (50 mg total) by mouth daily. Patient taking differently: Take 50 mg by mouth at bedtime. 05/01/23   Jodelle Red, MD  NEO-SYNEPHRINE COLD/ALLRG MILD 0.25 % nasal spray Place 1 spray into both nostrils every 6 (six) hours as needed for congestion.    [provider]  nicotine polacrilex (NICORETTE) 4 MG gum Take 4 mg by mouth as needed for smoking cessation.    [provider]  pantoprazole (PROTONIX) 40 MG tablet Take 40 mg by mouth at bedtime as needed (for heartburn).    [provider]  pseudoephedrine (SUDAFED) 120 MG 12 hr tablet Take 120 mg by mouth every 12 (twelve) hours as needed for congestion.    [provider]  sacubitril-valsartan (ENTRESTO) 24-26 MG Take 1 tablet by mouth 2 (two) times daily. 11/17/23 01/16/24  Zigmund Daniel., MD  spironolactone (ALDACTONE) 25 MG tablet Take 0.5 tablets (12.5 mg total) by mouth daily. 11/18/23 01/17/24  Zigmund Daniel., MD  tadalafil (CIALIS) 20 MG tablet Take 20 mg by mouth daily as needed for erectile dysfunction.     [provider]  temazepam (RESTORIL) 30 MG capsule Take 30 mg by mouth at bedtime.    [provider]      Allergies    Aspirin, Avelox [moxifloxacin hcl in nacl], Colchicine, Gabapentin, Latex, Nsaids, and Penicillins    Review of Systems   Review of Systems  HENT:         Epistaxis  Respiratory:         Left rib pain  Musculoskeletal:        Left shoulder  pain  All other systems reviewed and are negative.   Physical Exam Updated Vital Signs BP (!) 132/91   Pulse (!) 102   Temp 97.9 F (36.6 C)   Resp 16   Ht 5\' 9"  (1.753 m)   Wt 89 kg   SpO2 96%   BMI 28.98 kg/m  Physical Exam Vitals and nursing note reviewed.   Gen: NAD Eyes: PERRL, EOMI HEENT: Dried blood in the posterior oropharynx with no active bleeding noted, there is blood in the bilateral nares with no active bleeding noted, no appreciable septal hematoma Neck: trachea midline, c-collar in place, tenderness over the mid cervical spine Resp: clear to auscultation bilaterally Card: RRR, no murmurs, rubs, or gallops Abd: nontender, nondistended Extremities: Patient is tender over the proximal left humerus with questionable deformity noted, the remainder of the extremities are atraumatic, Apartments are soft Vascular: 2+ radial pulses bilaterally, 2+ DP pulses bilaterally Neuro: No focal deficits Skin: no rashes Psyc: acting appropriately   ED Results / Procedures / Treatments   Labs (all labs ordered are listed, but only abnormal results are displayed) Labs Reviewed  CBC - Abnormal; Notable for the following components:      Result Value   WBC 12.6 (*)    All other components within normal limits  BASIC METABOLIC PANEL - Abnormal; Notable for the following components:   Sodium 134 (*)    Glucose, Bld 192 (*)    All other components within normal limits    EKG None  Radiology CT Head Wo Contrast Result Date: 12/19/2023 CLINICAL DATA:  Head trauma, coagulopathy (Age 58-64y); Facial trauma, blunt; Neck trauma, dangerous injury mechanism (Age 4-64y) EXAM: CT HEAD WITHOUT CONTRAST CT MAXILLOFACIAL WITHOUT CONTRAST CT CERVICAL SPINE WITHOUT CONTRAST TECHNIQUE: Multidetector CT imaging of the head, cervical spine, and maxillofacial structures were performed using the standard protocol without intravenous contrast. Multiplanar CT image reconstructions of the cervical  spine and maxillofacial structures were also generated. RADIATION DOSE REDUCTION: This exam was performed according to the departmental dose-optimization program which includes automated exposure control, adjustment of the mA and/or kV according to patient size and/or use of iterative reconstruction technique. COMPARISON:  None Available. FINDINGS: CT HEAD FINDINGS Brain: Small area of subdural hemorrhage along the anterior falx measuring 4 mm (series 5, image 46). No hydrocephalus. No mass effect. No mass lesion. 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 No CT evidence of an acute cortical infarct. Vascular: No hyperdense vessel or unexpected calcification. Skull: Normal. Negative for fracture or focal lesion. Other: None. CT MAXILLOFACIAL FINDINGS Osseous: Mildly displaced fracture of the nasal bone on the left with overlying soft tissue swelling. Fracture may also involve the bony nasal  septum. Orbits: Negative. No traumatic or inflammatory finding. Sinuses: No middle ear or mastoid effusion. Paranasal sinuses are notable for pansinus mucosal thickening. Soft tissues: Soft tissue swelling overlying the left-sided nasal bone fracture. CT CERVICAL SPINE FINDINGS Alignment: Normal. Skull base and vertebrae: No acute fracture. No primary bone lesion or focal pathologic process. Soft tissues and spinal canal: No prevertebral fluid or swelling. No visible canal hematoma. Disc levels:  No CT evidence of high-grade spinal canal stenosis Upper chest: Negative. Other: None IMPRESSION: 1. Small area of subdural hemorrhage along the anterior falx measuring 4 mm. No mass effect. 2. Mildly displaced fracture of the nasal bone on the left with overlying soft tissue swelling. Fracture may also involve the bony nasal septum. 3. No acute fracture or traumatic subluxation of the cervical spine. Electronically Signed   By: Lorenza Cambridge M.D.   On: 12/19/2023 12:34   CT Cervical Spine Wo Contrast Result Date:  12/19/2023 CLINICAL DATA:  Head trauma, coagulopathy (Age 70-64y); Facial trauma, blunt; Neck trauma, dangerous injury mechanism (Age 72-64y) EXAM: CT HEAD WITHOUT CONTRAST CT MAXILLOFACIAL WITHOUT CONTRAST CT CERVICAL SPINE WITHOUT CONTRAST TECHNIQUE: Multidetector CT imaging of the head, cervical spine, and maxillofacial structures were performed using the standard protocol without intravenous contrast. Multiplanar CT image reconstructions of the cervical spine and maxillofacial structures were also generated. RADIATION DOSE REDUCTION: This exam was performed according to the departmental dose-optimization program which includes automated exposure control, adjustment of the mA and/or kV according to patient size and/or use of iterative reconstruction technique. COMPARISON:  None Available. FINDINGS: CT HEAD FINDINGS Brain: Small area of subdural hemorrhage along the anterior falx measuring 4 mm (series 5, image 46). No hydrocephalus. No mass effect. No mass lesion. 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 No CT evidence of an acute cortical infarct. Vascular: No hyperdense vessel or unexpected calcification. Skull: Normal. Negative for fracture or focal lesion. Other: None. CT MAXILLOFACIAL FINDINGS Osseous: Mildly displaced fracture of the nasal bone on the left with overlying soft tissue swelling. Fracture may also involve the bony nasal septum. Orbits: Negative. No traumatic or inflammatory finding. Sinuses: No middle ear or mastoid effusion. Paranasal sinuses are notable for pansinus mucosal thickening. Soft tissues: Soft tissue swelling overlying the left-sided nasal bone fracture. CT CERVICAL SPINE FINDINGS Alignment: Normal. Skull base and vertebrae: No acute fracture. No primary bone lesion or focal pathologic process. Soft tissues and spinal canal: No prevertebral fluid or swelling. No visible canal hematoma. Disc levels:  No CT evidence of high-grade spinal canal stenosis Upper chest: Negative.  Other: None IMPRESSION: 1. Small area of subdural hemorrhage along the anterior falx measuring 4 mm. No mass effect. 2. Mildly displaced fracture of the nasal bone on the left with overlying soft tissue swelling. Fracture may also involve the bony nasal septum. 3. No acute fracture or traumatic subluxation of the cervical spine. Electronically Signed   By: Lorenza Cambridge M.D.   On: 12/19/2023 12:34   CT Maxillofacial Wo Contrast Result Date: 12/19/2023 CLINICAL DATA:  Head trauma, coagulopathy (Age 72-64y); Facial trauma, blunt; Neck trauma, dangerous injury mechanism (Age 28-64y) EXAM: CT HEAD WITHOUT CONTRAST CT MAXILLOFACIAL WITHOUT CONTRAST CT CERVICAL SPINE WITHOUT CONTRAST TECHNIQUE: Multidetector CT imaging of the head, cervical spine, and maxillofacial structures were performed using the standard protocol without intravenous contrast. Multiplanar CT image reconstructions of the cervical spine and maxillofacial structures were also generated. RADIATION DOSE REDUCTION: This exam was performed according to the departmental dose-optimization program which includes automated exposure control,  adjustment of the mA and/or kV according to patient size and/or use of iterative reconstruction technique. COMPARISON:  None Available. FINDINGS: CT HEAD FINDINGS Brain: Small area of subdural hemorrhage along the anterior falx measuring 4 mm (series 5, image 46). No hydrocephalus. No mass effect. No mass lesion. 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 No CT evidence of an acute cortical infarct. Vascular: No hyperdense vessel or unexpected calcification. Skull: Normal. Negative for fracture or focal lesion. Other: None. CT MAXILLOFACIAL FINDINGS Osseous: Mildly displaced fracture of the nasal bone on the left with overlying soft tissue swelling. Fracture may also involve the bony nasal septum. Orbits: Negative. No traumatic or inflammatory finding. Sinuses: No middle ear or mastoid effusion. Paranasal sinuses  are notable for pansinus mucosal thickening. Soft tissues: Soft tissue swelling overlying the left-sided nasal bone fracture. CT CERVICAL SPINE FINDINGS Alignment: Normal. Skull base and vertebrae: No acute fracture. No primary bone lesion or focal pathologic process. Soft tissues and spinal canal: No prevertebral fluid or swelling. No visible canal hematoma. Disc levels:  No CT evidence of high-grade spinal canal stenosis Upper chest: Negative. Other: None IMPRESSION: 1. Small area of subdural hemorrhage along the anterior falx measuring 4 mm. No mass effect. 2. Mildly displaced fracture of the nasal bone on the left with overlying soft tissue swelling. Fracture may also involve the bony nasal septum. 3. No acute fracture or traumatic subluxation of the cervical spine. Electronically Signed   By: Lorenza Cambridge M.D.   On: 12/19/2023 12:34   DG Shoulder Left Result Date: 12/19/2023 CLINICAL DATA:  Fall.  Left shoulder deformity. EXAM: LEFT SHOULDER - 2+ VIEW COMPARISON:  None Available. FINDINGS: The humeral head is anteriorly inferiorly and medially dislocated with respect to the glenoid fossa. Mild inferior humeral head-neck junction and mild inferior glenoid degenerative osteophytosis. Moderate acromioclavicular joint space narrowing with mild peripheral osteophytosis. Mild-to-moderate distal lateral subacromial spurring. No acute fracture is seen. The visualized portion of the left lung is unremarkable. IMPRESSION: 1. Anterior, inferior, and medial dislocation of the humeral head with respect to the glenoid fossa. 2. 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 Electronically Signed   By: Neita Garnet M.D.   On: 12/19/2023 12:00   DG Chest 1 View Result Date: 12/19/2023 CLINICAL DATA:  Fall.  Left shoulder deformity. EXAM: CHEST  1 VIEW COMPARISON:  Chest radiographs 11/14/2023 FINDINGS: Cardiac silhouette and mediastinal contours are within normal limits. The lungs are clear. No  pleural effusion or pneumothorax. Anterior dislocation of the left shoulder as also seen on dedicated radiographs. Moderate dextrocurvature of the midthoracic spine with mild-to-moderate multilevel degenerative disc changes. IMPRESSION: 1.  No acute cardiopulmonary disease process. 2. Anterior dislocation of the left shoulder. Electronically Signed   By: Neita Garnet M.D.   On: 12/19/2023 11:57    Procedures .Reduction of dislocation  Date/Time: 12/19/2023 2:51 PM  Performed by: Durwin Glaze, MD Authorized by: Durwin Glaze, MD  Consent: Verbal consent obtained. Risks and benefits: risks, benefits and alternatives were discussed Consent given by: patient Patient understanding: patient states understanding of the procedure being performed Patient consent: the patient's understanding of the procedure matches consent given Procedure consent: procedure consent matches procedure scheduled Relevant documents: relevant documents present and verified Test results: test results available and properly labeled Site marked: the operative site was marked Imaging studies: imaging studies available Patient identity confirmed: verbally with patient and arm band Time out: Immediately prior to procedure a "time out" was called to verify the correct  patient, procedure, equipment, support staff and site/side marked as required.  Sedation: Patient sedated: yes Sedatives: propofol Vitals: Vital signs were monitored during sedation.  Patient tolerance: patient tolerated the procedure well with no immediate complications   .Sedation  Date/Time: 12/19/2023 2:52 PM  Performed by: Durwin Glaze, MD Authorized by: Durwin Glaze, MD   Consent:    Consent obtained:  Verbal   Consent given by:  Patient   Risks discussed:  Allergic reaction, prolonged hypoxia resulting in organ damage, dysrhythmia, prolonged sedation necessitating reversal, inadequate sedation, respiratory compromise necessitating ventilatory  assistance and intubation, nausea and vomiting   Alternatives discussed:  Analgesia without sedation Universal protocol:    Immediately prior to procedure, a time out was called: yes   Indications:    Procedure necessitating sedation performed by:  Physician performing sedation Pre-sedation assessment:    Time since last food or drink:  0545   ASA classification: class 2 - patient with mild systemic disease     Mouth opening:  2 finger widths   Thyromental distance:  3 finger widths   Mallampati score:  III - soft palate, base of uvula visible   Pre-sedation assessments completed and reviewed: pre-procedure airway patency not reviewed, pre-procedure cardiovascular function not reviewed, pre-procedure hydration status not reviewed, pre-procedure mental status not reviewed, pre-procedure nausea and vomiting status not reviewed, pre-procedure pain level not reviewed, pre-procedure respiratory function not reviewed and pre-procedure temperature not reviewed   A pre-sedation assessment was completed prior to the start of the procedure Immediate pre-procedure details:    Reviewed: vital signs and relevant labs/tests   Procedure details (see MAR for exact dosages):    Sedation:  Propofol   Intended level of sedation: moderate (conscious sedation)   Total Provider sedation time (minutes):  25 Post-procedure details:   A post-sedation assessment was completed following the completion of the procedure.   Attendance: Constant attendance by certified staff until patient recovered     Recovery: Patient returned to pre-procedure baseline     Post-sedation assessments completed and reviewed: post-procedure airway patency not reviewed, post-procedure cardiovascular function not reviewed, post-procedure hydration status not reviewed, post-procedure mental status not reviewed, post-procedure nausea and vomiting status not reviewed, pain score not reviewed, post-procedure respiratory function not reviewed and  post-procedure temperature not reviewed     Procedure completion:  Tolerated well, no immediate complications .Laceration Repair  Date/Time: 12/19/2023 2:58 PM  Performed by: Durwin Glaze, MD Authorized by: Durwin Glaze, MD   Consent:    Consent obtained:  Verbal   Consent given by:  Patient   Risks discussed:  Infection, pain, retained foreign body, poor cosmetic result, need for additional repair, nerve damage, poor wound healing and vascular damage   Alternatives discussed:  No treatment Universal protocol:    Patient identity confirmed:  Verbally with patient Anesthesia:    Anesthesia method:  Local infiltration   Local anesthetic:  Lidocaine 2% w/o epi Laceration details:    Location:  Face   Face location:  Nose   Length (cm):  3.5   Depth (mm):  3 Pre-procedure details:    Preparation:  Patient was prepped and draped in usual sterile fashion and imaging obtained to evaluate for foreign bodies Exploration:    Limited defect created (wound extended): no     Hemostasis achieved with:  Direct pressure   Contaminated: no   Treatment:    Area cleansed with:  Soap and water   Amount of cleaning:  Standard Skin  repair:    Repair method:  Sutures   Suture size:  6-0   Suture material:  Prolene   Suture technique:  Simple interrupted   Number of sutures:  3 Approximation:    Approximation:  Close Repair type:    Repair type:  Simple Post-procedure details:    Dressing:  Open (no dressing)   Procedure completion:  Tolerated     Medications Ordered in ED Medications  propofol (DIPRIVAN) 10 mg/mL bolus/IV push 44.5 mg (44.5 mg Intravenous Not Given 12/19/23 1316)  oxyCODONE-acetaminophen (PERCOCET/ROXICET) 5-325 MG per tablet 1 tablet (has no administration in time range)  cephALEXin (KEFLEX) capsule 500 mg (has no administration in time range)  oxymetazoline (AFRIN) 0.05 % nasal spray 2 spray (2 sprays Each Nare Given 12/19/23 1115)  Tdap (BOOSTRIX) injection 0.5 mL  (0.5 mLs Intramuscular Given 12/19/23 1234)  morphine (PF) 4 MG/ML injection 4 mg (4 mg Intravenous Given 12/19/23 1114)  ondansetron (ZOFRAN) injection 4 mg (4 mg Intravenous Given 12/19/23 1114)  sodium chloride 0.9 % bolus 500 mL (0 mLs Intravenous Stopped 12/19/23 1431)  fentaNYL (SUBLIMAZE) injection 50 mcg (50 mcg Intravenous Given 12/19/23 1230)  lidocaine (XYLOCAINE) 2 % (with pres) injection 100 mg (100 mg Infiltration Given 12/19/23 1239)  propofol (DIPRIVAN) 10 mg/mL bolus/IV push (50 mg Intravenous Given 12/19/23 1304)    ED Course/ Medical Decision Making/ A&P                                 Medical Decision Making 58 year old male with past medical history of diabetes and hypertension who is on Plavix presenting to the emergency department today with obvious facial injury after a fall at home.  I will further evaluate patient here with a CT scan of his head, cervical spine, and maxillofacial bones to evaluate for acute traumatic injuries in addition to a chest x-ray and shoulder x-ray.  Will give patient morphine Zofran for symptoms and reevaluate for ultimate disposition.  The patient CT scan shows a nasal bone fracture.  He does not have any obvious septal hematoma here.  His laceration was ultimately repaired.  X-ray shows anterior shoulder dislocation.  This was reduced here.  CT scan of his head shows small subdural hematoma.  I did call and discussed this with Dr. Dutch Quint.  He recommends having repeat CT scan in 6 hours and if this is stable the patient can be discharged while holding his Plavix for 7 days.  I discussed the patient's open nasal bone fracture with Dr. Jearld Fenton.  He recommend starting the patient on Keflex and having him follow-up in the clinic in 1 week.  Repeat CT head is pending at the time of signout.  Plan is for discharge with specialist follow-up if CT imaging is unremarkable.  On reassessment after laceration repair I still do not appreciate any septal hematoma or any  active epistaxis.  CRITICAL CARE Performed by: Durwin Glaze   Total critical care time: 35 minutes  Critical care time was exclusive of separately billable procedures and treating other patients.  Critical care was necessary to treat or prevent imminent or life-threatening deterioration.  Critical care was time spent personally by me on the following activities: development of treatment plan with patient and/or surrogate as well as nursing, discussions with consultants, evaluation of patient's response to treatment, examination of patient, obtaining history from patient or surrogate, ordering and performing treatments and interventions, ordering and review  of laboratory studies, ordering and review of radiographic studies, pulse oximetry and re-evaluation of patient's condition.    Amount and/or Complexity of Data Reviewed Labs: ordered. Radiology: ordered.  Risk OTC drugs. Prescription drug management.          Final Clinical Impression(s) / ED Diagnoses Final diagnoses:  Open fracture of nasal bone, initial encounter  Anterior dislocation of left shoulder, initial encounter  Rib contusion, left, initial encounter  Subdural hematoma (HCC)    Rx / DC Orders ED Discharge Orders          Ordered    cephALEXin (KEFLEX) 500 MG capsule  4 times daily        12/19/23 1449    lidocaine (HM LIDOCAINE PATCH) 4 %  Every 24 hours        12/19/23 1449              Durwin Glaze, MD 12/19/23 1504

## 2023-12-20 ENCOUNTER — Other Ambulatory Visit (HOSPITAL_COMMUNITY): Payer: Self-pay

## 2023-12-20 DIAGNOSIS — S065XAA Traumatic subdural hemorrhage with loss of consciousness status unknown, initial encounter: Secondary | ICD-10-CM

## 2023-12-20 DIAGNOSIS — S43015A Anterior dislocation of left humerus, initial encounter: Secondary | ICD-10-CM | POA: Diagnosis not present

## 2023-12-20 DIAGNOSIS — S022XXB Fracture of nasal bones, initial encounter for open fracture: Secondary | ICD-10-CM

## 2023-12-20 DIAGNOSIS — I5022 Chronic systolic (congestive) heart failure: Secondary | ICD-10-CM | POA: Diagnosis not present

## 2023-12-20 LAB — BASIC METABOLIC PANEL
Anion gap: 7 (ref 5–15)
BUN: 15 mg/dL (ref 6–20)
CO2: 22 mmol/L (ref 22–32)
Calcium: 8.7 mg/dL — ABNORMAL LOW (ref 8.9–10.3)
Chloride: 106 mmol/L (ref 98–111)
Creatinine, Ser: 1.22 mg/dL (ref 0.61–1.24)
GFR, Estimated: >60 mL/min (ref >60)
Glucose, Bld: 153 mg/dL — ABNORMAL HIGH (ref 70–99)
Potassium: 4 mmol/L (ref 3.5–5.1)
Sodium: 135 mmol/L (ref 135–145)

## 2023-12-20 LAB — CBC
HCT: 34.8 % — ABNORMAL LOW (ref 39.0–52.0)
Hemoglobin: 12 g/dL — ABNORMAL LOW (ref 13.0–17.0)
MCH: 30.2 pg (ref 26.0–34.0)
MCHC: 34.5 g/dL (ref 30.0–36.0)
MCV: 87.7 fL (ref 80.0–100.0)
Platelets: 234 10*3/uL (ref 150–400)
RBC: 3.97 MIL/uL — ABNORMAL LOW (ref 4.22–5.81)
RDW: 13.9 % (ref 11.5–15.5)
WBC: 8.9 10*3/uL (ref 4.0–10.5)
nRBC: 0 % (ref 0.0–0.2)

## 2023-12-20 LAB — CBG MONITORING, ED
Glucose-Capillary: 126 mg/dL — ABNORMAL HIGH (ref 70–99)
Glucose-Capillary: 135 mg/dL — ABNORMAL HIGH (ref 70–99)

## 2023-12-20 LAB — GLUCOSE, CAPILLARY
Glucose-Capillary: 145 mg/dL — ABNORMAL HIGH (ref 70–99)
Glucose-Capillary: 167 mg/dL — ABNORMAL HIGH (ref 70–99)

## 2023-12-20 MED ORDER — CEPHALEXIN 500 MG PO CAPS
500.0000 mg | ORAL_CAPSULE | Freq: Four times a day (QID) | ORAL | Status: DC
  Administered 2023-12-20 – 2023-12-25 (×23): 500 mg via ORAL
  Filled 2023-12-20 (×7): qty 1
  Filled 2023-12-20 (×2): qty 2
  Filled 2023-12-20 (×9): qty 1
  Filled 2023-12-20: qty 2
  Filled 2023-12-20 (×4): qty 1

## 2023-12-20 MED ORDER — BENZONATATE 100 MG PO CAPS
200.0000 mg | ORAL_CAPSULE | Freq: Three times a day (TID) | ORAL | Status: DC | PRN
  Administered 2023-12-20 – 2023-12-24 (×5): 200 mg via ORAL
  Filled 2023-12-20 (×5): qty 2

## 2023-12-20 MED ORDER — METOPROLOL TARTRATE 5 MG/5ML IV SOLN
2.5000 mg | Freq: Four times a day (QID) | INTRAVENOUS | Status: DC | PRN
  Administered 2023-12-20: 2.5 mg via INTRAVENOUS
  Filled 2023-12-20: qty 5

## 2023-12-20 MED ORDER — FUROSEMIDE 10 MG/ML IJ SOLN
40.0000 mg | Freq: Once | INTRAMUSCULAR | Status: AC
  Administered 2023-12-20: 40 mg via INTRAVENOUS
  Filled 2023-12-20: qty 4

## 2023-12-20 NOTE — Evaluation (Signed)
 Occupational Therapy Evaluation Patient Details Name: Dustin Sampson MRN: [id number] DOB: [birth year] Today's Date: 12/20/2023   History of Present Illness   Pt is a 58 y.o. male presenting 12/19/23 s/p a fall onto his nose/face and right shoulder dislocation. Imaging: Depressed nasal bone fractures and subdermal hematoma. Of note, pt's shoulder was reducted 12/19/23. PMHx anxiety, insomnia, ataxia, history of CVA, hypothyroidism, hypertension, PAD, frequent falls, and chronic HFrEF     Clinical Impressions Pt c/o pain to L shoulder to elbow, 8/10, NWB in sling. Pt lives at home with wife and two children who work during the day. Pt uses RW around home, transport chair for community, frequent falls 25+ in the last year. Pt states there are days he needs help with all activities using transport chair, and days where he can get up and do things at mod I with RW. Currently Pt has poor, ataxic balance, x2 min-mod A to maintain standing balance. Pt mod-max A for UB/LB ADLs. Overall good strength and endurance for activity, limited due to poor balance, ataxic movements in BLEs. Pt would benefit greatly from postacute rehab >3hrs/day to quickly improve and return home, will continue to follow acutely to maximize progress.      If plan is discharge home, recommend the following:   A lot of help with walking and/or transfers;A lot of help with bathing/dressing/bathroom;Assistance with cooking/housework;Assist for transportation;Help with stairs or ramp for entrance     Functional Status Assessment   Patient has had a recent decline in their functional status and demonstrates the ability to make significant improvements in function in a reasonable and predictable amount of time.     Equipment Recommendations   Other (comment) (defer)     Recommendations for Other Services   Rehab consult     Precautions/Restrictions   Precautions Precautions: Fall Shoulder Interventions: Shoulder  sling/immobilizer Recall of Precautions/Restrictions: Intact Required Braces or Orthoses: Sling Restrictions Weight Bearing Restrictions Per Provider Order: Yes LUE Weight Bearing Per Provider Order: Non weight bearing Other Position/Activity Restrictions: sling for 2 weeks     Mobility Bed Mobility Overal bed mobility: Needs Assistance Bed Mobility: Supine to Sit, Sit to Supine     Supine to sit: HOB elevated, Mod assist Sit to supine: Contact guard assist, HOB elevated   General bed mobility comments: Pt required mod A for trunk elevation.    Transfers Overall transfer level: Needs assistance Equipment used: 1 person hand held assist Transfers: Sit to/from Stand Sit to Stand: Min assist, +2 physical assistance, +2 safety/equipment           General transfer comment: poor balance, ataxic with movements, x2 for safety and maintaining balance.      Balance Overall balance assessment: Needs assistance Sitting-balance support: No upper extremity supported, Single extremity supported, Feet supported Sitting balance-Leahy Scale: Fair Sitting balance - Comments: pt sits EOB with no UE support  . No LOB   Standing balance support: During functional activity, Reliant on assistive device for balance, No upper extremity supported, Single extremity supported Standing balance-Leahy Scale: Poor Standing balance comment: freuqent LOB, x2 for safety, min-mod A to maintain balance                           ADL either performed or assessed with clinical judgement   ADL Overall ADL's : Needs assistance/impaired Eating/Feeding: Set up;Sitting   Grooming: Set up;Sitting   Upper Body Bathing: Moderate assistance;Sitting   Lower Body Bathing: Moderate assistance;Sitting/lateral  leans   Upper Body Dressing : Moderate assistance;Sitting   Lower Body Dressing: Maximal assistance;Sitting/lateral leans   Toilet Transfer: Minimal assistance;+2 for safety/equipment    Toileting- Clothing Manipulation and Hygiene: Set up;Sitting/lateral lean         General ADL Comments: Pt LUE NWB in sling due to dislocation, will need assistance for UB ADLs, poor balance, usually uses RW, not able to safely use cane due to ataxic movements, poor balane, frequent falls. mod-max for LB ADLs     Vision Baseline Vision/History: 0 No visual deficits Ability to See in Adequate Light: 1 Impaired Patient Visual Report: Other (comment) (nystagmus)       Perception         Praxis         Pertinent Vitals/Pain Pain Assessment Pain Assessment: 0-10 Pain Score: 8  Pain Location: L shoulder Pain Descriptors / Indicators: Grimacing, Guarding, Constant, Aching Pain Intervention(s): Monitored during session     Extremity/Trunk Assessment Upper Extremity Assessment Upper Extremity Assessment: LUE deficits/detail LUE Deficits / Details: L shoulder reduced, dislocated from fall. NWB, slin for 2 weeks, pain 8/10 at rest LUE: Shoulder pain at rest;Shoulder pain with ROM LUE Sensation: WNL LUE Coordination: decreased gross motor;decreased fine motor   Lower Extremity Assessment Lower Extremity Assessment: Generalized weakness;RLE deficits/detail;LLE deficits/detail RLE Deficits / Details: Grossly MMT 5/5 RLE Coordination: decreased gross motor LLE Deficits / Details: Gross MMT 4/5 LLE Coordination: decreased gross motor   Cervical / Trunk Assessment Cervical / Trunk Assessment: Kyphotic   Communication Communication Communication: No apparent difficulties   Cognition Arousal: Alert Behavior During Therapy: WFL for tasks assessed/performed Cognition: No apparent impairments                               Following commands: Intact       Cueing  General Comments   Cueing Techniques: Verbal cues;Visual cues      Exercises     Shoulder Instructions      Home Living Family/patient expects to be discharged to:: Private residence Living  Arrangements: Spouse/significant other;Children Available Help at Discharge: Family;Friend(s);Available PRN/intermittently Type of Home: House Home Access: Stairs to enter [employer] of Steps: 2 Entrance Stairs-Rails: None Home Layout: One level     Bathroom Shower/Tub: Producer, television/film/video: Handicapped height     Home Equipment: Standard Walker;Cane - single point;Transport chair;Shower seat;Grab bars - tub/shower;Wheelchair - power   Additional Comments: Pt lives with wife and kids who all work during the day. Pt's power w/c is broken. Pt uses RW around home, transport chair for community      Prior Functioning/Environment Prior Level of Function : Needs assist;Driving;History of Falls (last six months)             Mobility Comments: Pt states off and on he needs help with bed mobility, uses w/c, and some days he is able to walk with RW mod I. Frequent falls 25+ in last year ADLs Comments: some days needs helps.    OT Problem List: Decreased strength;Decreased range of motion;Decreased activity tolerance;Impaired balance (sitting and/or standing);Decreased coordination;Decreased safety awareness;Pain;Impaired UE functional use   OT Treatment/Interventions: Self-care/ADL training;Therapeutic exercise;Energy conservation;DME and/or AE instruction;Therapeutic activities;Balance training      OT Goals(Current goals can be found in the care plan section)   Acute Rehab OT Goals Patient Stated Goal: to return home OT Goal Formulation: With patient Time For Goal Achievement: 01/04/24 Potential to  Achieve Goals: Good   OT Frequency:  Min 1X/week    Co-evaluation              AM-PAC OT "6 Clicks" Daily Activity     Outcome Measure Help from another person eating meals?: A Little Help from another person taking care of personal grooming?: A Little Help from another person toileting, which includes using toliet, bedpan, or urinal?: A Lot Help  from another person bathing (including washing, rinsing, drying)?: A Lot Help from another person to put on and taking off regular upper body clothing?: A Lot Help from another person to put on and taking off regular lower body clothing?: A Lot 6 Click Score: 14   End of Session Equipment Utilized During Treatment: Gait belt Nurse Communication: Mobility status  Activity Tolerance: Patient tolerated treatment well Patient left: in bed;with call bell/phone within reach  OT Visit Diagnosis: Unsteadiness on feet (R26.81);Other abnormalities of gait and mobility (R26.89);Repeated falls (R29.6);Muscle weakness (generalized) (M62.81);History of falling (Z91.81);Pain Pain - Right/Left: Left Pain - part of body: Shoulder;Arm                Time: 1055-1120 OT Time Calculation (min): 25 min Charges:  OT General Charges $OT Visit: 1 Visit OT Evaluation $OT Eval Moderate Complexity: 1 Mod OT Treatments $Self Care/Home Management : 8-22 mins  [place], OTR/L   Alexis Goodell 12/20/2023, 11:26 AM

## 2023-12-20 NOTE — Progress Notes (Signed)
 [facility] PROGRESS NOTE   Dustin Sampson GNF:[id number] DOB: Feb 26, 1966 DOA: 12/19/2023  PCP: Jarrett Soho, PA-C  Brief History: 58 y.o. male with medical history significant for anxiety, insomnia, ataxia, history of CVA, hypothyroidism, hypertension, PAD, and chronic HFrEF who presented with injuries to his nose and right shoulder after a fall at home.  Patient has had ataxia since 2018.  Uses a walker at home.  Had a mechanical fall.  Found to have small subdural hematoma, left shoulder dislocation and mildly displaced nasal bone fracture.  Consultants: Phone discussion with the ENT, orthopedics, neurosurgery.  Procedures: Suture of laceration over nose    Subjective/Interval History: Patient complains of 7 out of 10 pain in the left shoulder.  Denies any headache per se.  No chest pain or shortness of breath.  No nausea or vomiting.    Assessment/Plan:  Subdural hematoma Secondary to mechanical fall.  CT head was repeated and showed stable findings.  Neurologically patient is stable. Imaging studies were reviewed by Dr. Jordan Likes with neurosurgery.  He recommends holding Plavix for 1 week.  Outpatient follow-up with neurosurgery.  Nasal bone fracture with mild displacement Discussed with Dr. Jearld Fenton with ENT who recommends follow-up in 1 week.  Also recommended cephalexin which is being administered.  Laceration over her nose This was sutured.  Will need suture removal in 5 days.  He has been given Tdap.  Left shoulder dislocation This was reduced in the emergency department.  Discussed with orthopedics.  Has to be nonweightbearing and in a sling for 2 weeks.  Outpatient follow-up with orthopedics in 2 weeks.  Chronic systolic CHF LVEF is 20 to 25% based on echo done in January 2025.  Appears to be compensated.  Continue with home medications.  Noted to be on Aldactone Entresto Jardiance and metoprolol.  Renal function is stable.  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  Uses a walker at home.  Now with immobility of his left arm he will have significant difficulty at home.  Does not think he will be able to do well at home.  PT OT evaluation.  May need to go to rehab.  Hypothyroidism Continue with levothyroxine.  History of stroke Holding Plavix due to SDH.  Continue statin.  Peripheral artery disease Holding Plavix.  Continue statin.   DVT Prophylaxis: SCDs only for now Code Status: Full code Family Communication: Discussed with patient Disposition Plan: To be determined  Status is: Observation The patient remains OBS appropriate and will d/c before 2 midnights.      Medications: Scheduled:  cephALEXin  500 mg Oral Q6H   empagliflozin  10 mg Oral Daily   febuxostat  80 mg Oral QPM   insulin aspart  0-5 Units Subcutaneous QHS   insulin aspart  0-6 Units Subcutaneous TID WC   levothyroxine  200 mcg Oral QHS   metoprolol succinate  50 mg Oral QHS   pravastatin  80 mg Oral Daily   sacubitril-valsartan  1 tablet Oral BID   sodium chloride flush  3 mL Intravenous Q12H   spironolactone  12.5 mg Oral Daily   temazepam  30 mg Oral QHS   Continuous: QIO:[id number] **OR** acetaminophen, clonazePAM, HYDROmorphone (DILAUDID) injection, oxyCODONE, polyethylene glycol  Antibiotics: Anti-infectives (From admission, onward)    Start     Dose/Rate Route Frequency Ordered Stop   12/20/23 0100  cephALEXin (KEFLEX) capsule 500 mg        500 mg Oral Every 6 hours 12/20/23 0059  12/19/23 1445  cephALEXin (KEFLEX) capsule 500 mg        500 mg Oral  Once 12/19/23 1444 12/19/23 1505   12/19/23 0000  cephALEXin (KEFLEX) 500 MG capsule        500 mg Oral 4 times daily 12/19/23 1449         Objective:  Vital Signs  Vitals:   12/20/23 0303 12/20/23 0500 12/20/23 0600 12/20/23 0725  BP:  (!) 151/105 (!) 132/93 (!) 144/96  Pulse:  (!) 46 (!) 55 (!) 107  Resp:  19 14 19   Temp: 98.2 F (36.8 C)   98.8 F (37.1 C)   TempSrc:    Oral  SpO2:  92% 92% 92%  Weight:      Height:        Intake/Output Summary (Last 24 hours) at 12/20/2023 0958 Last data filed at 12/19/2023 1431 Gross per 24 hour  Intake 500 ml  Output --  Net 500 ml   Filed Weights   12/19/23 0954  Weight: 89 kg    General appearance: Awake alert.  In no distress Bruising noted over the face. Resp: Clear to auscultation bilaterally.  Normal effort Cardio: S1-S2 is normal regular.  No S3-S4.  No rubs murmurs or bruit GI: Abdomen is soft.  Nontender nondistended.  Bowel sounds are present normal.  No masses organomegaly Extremities: No edema.  Left arm is in a sling Neurologic: Alert and oriented x3.  No focal neurological deficits.    Lab Results:  Data Reviewed: I have personally reviewed following labs and reports of the imaging studies  CBC: Recent Labs  Lab 12/19/23 1114 12/20/23 0418  WBC 12.6* 8.9  HGB 14.4 12.0*  HCT 42.5 34.8*  MCV 86.7 87.7  PLT 250 234    Basic Metabolic Panel: Recent Labs  Lab 12/19/23 1114 12/20/23 0418  NA 134* 135  K 4.8 4.0  CL 104 106  CO2 23 22  GLUCOSE 192* 153*  BUN 16 15  CREATININE 1.24 1.22  CALCIUM 9.1 8.7*    GFR: Estimated Creatinine Clearance: 72.8 mL/min (by C-G formula based on SCr of 1.22 mg/dL).   CBG: Recent Labs  Lab 12/19/23 2330 12/20/23 0740  GLUCAP 117* 126*    Radiology Studies: CT Head Wo Contrast Result Date: 12/19/2023 CLINICAL DATA:  Head trauma with coagulopathy. Standing fall to the left side with blood thinners. Laceration to the nose. EXAM: CT HEAD WITHOUT CONTRAST TECHNIQUE: Contiguous axial images were obtained from the base of the skull through the vertex without intravenous contrast. RADIATION DOSE REDUCTION: This exam was performed according to the departmental dose-optimization program which includes automated exposure control, adjustment of the mA and/or kV according to patient size and/or use of iterative reconstruction  technique. COMPARISON:  12/19/2023 at 11:41 a.m. FINDINGS: Brain: Small focal area of increased attenuation again demonstrated in the right side of the falx measuring 4 mm in depth. This may represent a focal subdural hemorrhage. No change since the prior study from earlier today. No new hemorrhage or progression. No mass-effect or midline shift. Mild cerebral atrophy. Ventricular dilatation consistent with central atrophy. Low-attenuation changes in the deep white matter consistent with chronic small vessel ischemia. Old lacunar infarct in the right cerebellum. Vascular: No hyperdense vessel or unexpected calcification. Skull: Calvarium appears intact. No acute depressed skull fractures. Depressed nasal bone fractures are again demonstrated without progression. Circumscribed cystic lesion in the anterior maxilla is unchanged, possibly a dentigerous cyst. Sinuses/Orbits: Mucosal thickening in the paranasal sinuses.  Mastoid air cells are clear. Other: None. IMPRESSION: 1. No significant change since prior study from earlier today. 2. Again demonstrated is a small focal subdural hemorrhage along the right side of the falx measuring 4 mm. No progression or significant mass-effect. 3. Chronic atrophy and small vessel ischemic changes. 4. Depressed nasal bone fractures are again seen. 5. Cystic structure in the anterior maxilla likely representing dentigerous cyst. Electronically Signed   By: Burman Nieves M.D.   On: 12/19/2023 19:18   DG Shoulder Left Result Date: 12/19/2023 CLINICAL DATA:  Status post left shoulder dislocation reduction. EXAM: LEFT SHOULDER - 2+ VIEW COMPARISON:  Earlier today. FINDINGS: The previously demonstrated anterior shoulder dislocation has been reduced. No visible fracture. Mild greater tuberosity hyperostosis. IMPRESSION: 1. Reduction of the previously demonstrated anterior shoulder dislocation. 2. Mild greater tuberosity hyperostosis. This can be seen with chronic rotator cuff  tendinopathy. Electronically Signed   By: Beckie Salts M.D.   On: 12/19/2023 15:05   CT Head Wo Contrast Result Date: 12/19/2023 CLINICAL DATA:  Head trauma, coagulopathy (Age 25-64y); Facial trauma, blunt; Neck trauma, dangerous injury mechanism (Age 23-64y) EXAM: CT HEAD WITHOUT CONTRAST CT MAXILLOFACIAL WITHOUT CONTRAST CT CERVICAL SPINE WITHOUT CONTRAST TECHNIQUE: Multidetector CT imaging of the head, cervical spine, and maxillofacial structures were performed using the standard protocol without intravenous contrast. Multiplanar CT image reconstructions of the cervical spine and maxillofacial structures were also generated. RADIATION DOSE REDUCTION: This exam was performed according to the departmental dose-optimization program which includes automated exposure control, adjustment of the mA and/or kV according to patient size and/or use of iterative reconstruction technique. COMPARISON:  None Available. FINDINGS: CT HEAD FINDINGS Brain: Small area of subdural hemorrhage along the anterior falx measuring 4 mm (series 5, image 46). No hydrocephalus. No mass effect. No mass lesion. 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 No CT evidence of an acute cortical infarct. Vascular: No hyperdense vessel or unexpected calcification. Skull: Normal. Negative for fracture or focal lesion. Other: None. CT MAXILLOFACIAL FINDINGS Osseous: Mildly displaced fracture of the nasal bone on the left with overlying soft tissue swelling. Fracture may also involve the bony nasal septum. Orbits: Negative. No traumatic or inflammatory finding. Sinuses: No middle ear or mastoid effusion. Paranasal sinuses are notable for pansinus mucosal thickening. Soft tissues: Soft tissue swelling overlying the left-sided nasal bone fracture. CT CERVICAL SPINE FINDINGS Alignment: Normal. Skull base and vertebrae: No acute fracture. No primary bone lesion or focal pathologic process. Soft tissues and spinal canal: No prevertebral fluid or  swelling. No visible canal hematoma. Disc levels:  No CT evidence of high-grade spinal canal stenosis Upper chest: Negative. Other: None IMPRESSION: 1. Small area of subdural hemorrhage along the anterior falx measuring 4 mm. No mass effect. 2. Mildly displaced fracture of the nasal bone on the left with overlying soft tissue swelling. Fracture may also involve the bony nasal septum. 3. No acute fracture or traumatic subluxation of the cervical spine. Electronically Signed   By: Lorenza Cambridge M.D.   On: 12/19/2023 12:34   CT Cervical Spine Wo Contrast Result Date: 12/19/2023 CLINICAL DATA:  Head trauma, coagulopathy (Age 67-64y); Facial trauma, blunt; Neck trauma, dangerous injury mechanism (Age 13-64y) EXAM: CT HEAD WITHOUT CONTRAST CT MAXILLOFACIAL WITHOUT CONTRAST CT CERVICAL SPINE WITHOUT CONTRAST TECHNIQUE: Multidetector CT imaging of the head, cervical spine, and maxillofacial structures were performed using the standard protocol without intravenous contrast. Multiplanar CT image reconstructions of the cervical spine and maxillofacial structures were also generated. RADIATION DOSE REDUCTION: This  exam was performed according to the departmental dose-optimization program which includes automated exposure control, adjustment of the mA and/or kV according to patient size and/or use of iterative reconstruction technique. COMPARISON:  None Available. FINDINGS: CT HEAD FINDINGS Brain: Small area of subdural hemorrhage along the anterior falx measuring 4 mm (series 5, image 46). No hydrocephalus. No mass effect. No mass lesion. 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 No CT evidence of an acute cortical infarct. Vascular: No hyperdense vessel or unexpected calcification. Skull: Normal. Negative for fracture or focal lesion. Other: None. CT MAXILLOFACIAL FINDINGS Osseous: Mildly displaced fracture of the nasal bone on the left with overlying soft tissue swelling. Fracture may also involve the bony nasal  septum. Orbits: Negative. No traumatic or inflammatory finding. Sinuses: No middle ear or mastoid effusion. Paranasal sinuses are notable for pansinus mucosal thickening. Soft tissues: Soft tissue swelling overlying the left-sided nasal bone fracture. CT CERVICAL SPINE FINDINGS Alignment: Normal. Skull base and vertebrae: No acute fracture. No primary bone lesion or focal pathologic process. Soft tissues and spinal canal: No prevertebral fluid or swelling. No visible canal hematoma. Disc levels:  No CT evidence of high-grade spinal canal stenosis Upper chest: Negative. Other: None IMPRESSION: 1. Small area of subdural hemorrhage along the anterior falx measuring 4 mm. No mass effect. 2. Mildly displaced fracture of the nasal bone on the left with overlying soft tissue swelling. Fracture may also involve the bony nasal septum. 3. No acute fracture or traumatic subluxation of the cervical spine. Electronically Signed   By: Lorenza Cambridge M.D.   On: 12/19/2023 12:34   CT Maxillofacial Wo Contrast Result Date: 12/19/2023 CLINICAL DATA:  Head trauma, coagulopathy (Age 98-64y); Facial trauma, blunt; Neck trauma, dangerous injury mechanism (Age 70-64y) EXAM: CT HEAD WITHOUT CONTRAST CT MAXILLOFACIAL WITHOUT CONTRAST CT CERVICAL SPINE WITHOUT CONTRAST TECHNIQUE: Multidetector CT imaging of the head, cervical spine, and maxillofacial structures were performed using the standard protocol without intravenous contrast. Multiplanar CT image reconstructions of the cervical spine and maxillofacial structures were also generated. RADIATION DOSE REDUCTION: This exam was performed according to the departmental dose-optimization program which includes automated exposure control, adjustment of the mA and/or kV according to patient size and/or use of iterative reconstruction technique. COMPARISON:  None Available. FINDINGS: CT HEAD FINDINGS Brain: Small area of subdural hemorrhage along the anterior falx measuring 4 mm (series 5,  image 46). No hydrocephalus. No mass effect. No mass lesion. 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 No CT evidence of an acute cortical infarct. Vascular: No hyperdense vessel or unexpected calcification. Skull: Normal. Negative for fracture or focal lesion. Other: None. CT MAXILLOFACIAL FINDINGS Osseous: Mildly displaced fracture of the nasal bone on the left with overlying soft tissue swelling. Fracture may also involve the bony nasal septum. Orbits: Negative. No traumatic or inflammatory finding. Sinuses: No middle ear or mastoid effusion. Paranasal sinuses are notable for pansinus mucosal thickening. Soft tissues: Soft tissue swelling overlying the left-sided nasal bone fracture. CT CERVICAL SPINE FINDINGS Alignment: Normal. Skull base and vertebrae: No acute fracture. No primary bone lesion or focal pathologic process. Soft tissues and spinal canal: No prevertebral fluid or swelling. No visible canal hematoma. Disc levels:  No CT evidence of high-grade spinal canal stenosis Upper chest: Negative. Other: None IMPRESSION: 1. Small area of subdural hemorrhage along the anterior falx measuring 4 mm. No mass effect. 2. Mildly displaced fracture of the nasal bone on the left with overlying soft tissue swelling. Fracture may also involve the bony nasal  septum. 3. No acute fracture or traumatic subluxation of the cervical spine. Electronically Signed   By: Lorenza Cambridge M.D.   On: 12/19/2023 12:34   DG Shoulder Left Result Date: 12/19/2023 CLINICAL DATA:  Fall.  Left shoulder deformity. EXAM: LEFT SHOULDER - 2+ VIEW COMPARISON:  None Available. FINDINGS: The humeral head is anteriorly inferiorly and medially dislocated with respect to the glenoid fossa. Mild inferior humeral head-neck junction and mild inferior glenoid degenerative osteophytosis. Moderate acromioclavicular joint space narrowing with mild peripheral osteophytosis. Mild-to-moderate distal lateral subacromial spurring. No acute fracture is  seen. The visualized portion of the left lung is unremarkable. IMPRESSION: 1. Anterior, inferior, and medial dislocation of the humeral head with respect to the glenoid fossa. 2. 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 Electronically Signed   By: Neita Garnet M.D.   On: 12/19/2023 12:00   DG Chest 1 View Result Date: 12/19/2023 CLINICAL DATA:  Fall.  Left shoulder deformity. EXAM: CHEST  1 VIEW COMPARISON:  Chest radiographs 11/14/2023 FINDINGS: Cardiac silhouette and mediastinal contours are within normal limits. The lungs are clear. No pleural effusion or pneumothorax. Anterior dislocation of the left shoulder as also seen on dedicated radiographs. Moderate dextrocurvature of the midthoracic spine with mild-to-moderate multilevel degenerative disc changes. IMPRESSION: 1.  No acute cardiopulmonary disease process. 2. Anterior dislocation of the left shoulder. Electronically Signed   By: Neita Garnet M.D.   On: 12/19/2023 11:57       LOS: 0 days   Amora Sheehy Rito Ehrlich  [facility] Pager on [email or web address]  12/20/2023, 9:58 AM

## 2023-12-20 NOTE — Evaluation (Signed)
 Physical Therapy Evaluation Patient Details Name: Dustin Sampson MRN: [id number] DOB: 1966/03/11 Today's Date: 12/20/2023  History of Present Illness  Pt is a 58 y.o. male presenting 12/19/23 s/p a fall onto his nose/face and right shoulder dislocation. Imaging: Depressed nasal bone fractures and subdermal hematoma. Of note, pt's shoulder was reducted 12/19/23. PMHx anxiety, insomnia, ataxia, history of CVA, hypothyroidism, hypertension, PAD, frequent falls, and chronic HFrEF  Clinical Impression  Pt is a 58 y.o. male presenting on 12/19/23 with above conditions and deficits below, see PT Problem List. PTA pt lived in a one story home with his wife and two adult children who assisted him with some of his ADLs and mobility. Pt used a walker with tennis balls for mobility, drives, but doesn't work. Currently, pt is mod A for sit to supine bed mobility, min A x2 for transfers, and min-mod Ax2 for ambulation. Pt displays impairments in ambulation, fine and gross motor control, functional mobility, and LE strength. Given pt's impairments and home living situation he would benefit from continued acute PT care and intense rehab > 3 hours upon d/c. Will continue to follow acutely.         If plan is discharge home, recommend the following: Two people to help with walking and/or transfers;Two people to help with bathing/dressing/bathroom;Assistance with cooking/housework;Assist for transportation;Help with stairs or ramp for entrance   Can travel by private vehicle        Equipment Recommendations BSC/3in1  Recommendations for Other Services  Rehab consult;OT consult    Functional Status Assessment Patient has had a recent decline in their functional status and demonstrates the ability to make significant improvements in function in a reasonable and predictable amount of time.     Precautions / Restrictions Precautions Precautions: Fall Recall of Precautions/Restrictions: Intact Required Braces or  Orthoses: Sling Restrictions Weight Bearing Restrictions Per Provider Order: Yes Other Position/Activity Restrictions: L arm NWB in sling for 2 weeks, per Dr. Rito Ehrlich secure chat message on 12/20/23      Mobility  Bed Mobility Overal bed mobility: Needs Assistance Bed Mobility: Supine to Sit, Sit to Supine     Supine to sit: HOB elevated, Mod assist Sit to supine: Contact guard assist, HOB elevated   General bed mobility comments: Pt required mod A for trunk elevation.    Transfers Overall transfer level: Needs assistance Equipment used: Straight cane Transfers: Sit to/from Stand Sit to Stand: Min assist, +2 physical assistance           General transfer comment: pt required min A x2 s/t ataxic movements upon weightbearing.    Ambulation/Gait Ambulation/Gait assistance: Min assist, +2 physical assistance, Mod assist Gait Distance (Feet): 2 Feet Assistive device: Straight cane Gait Pattern/deviations: Step-to pattern, Decreased stride length, Ataxic, Staggering left, Staggering right, Trunk flexed, Wide base of support Gait velocity: reduced Gait velocity interpretation: <1.31 ft/sec, indicative of household ambulator Pre-gait activities: pt marches with severe ataxic, decreased foot clearance, wide BOS, and eccentric control. General Gait Details: Pt walked with greatly increased ataxic pattern, wide BOS, and had difficulty controlling SPC. Required min A cueing for increasing weight shifts and safety. mod A from falling posteriorly.  Stairs            Wheelchair Mobility     Tilt Bed    Modified Rankin (Stroke Patients Only)       Balance Overall balance assessment: Needs assistance Sitting-balance support: No upper extremity supported, Single extremity supported, Feet supported Sitting balance-Leahy Scale: Fair Sitting balance -  Comments: pt sits EOB with no UE support  . No LOB Postural control: Posterior lean Standing balance support: During  functional activity, Reliant on assistive device for balance, No upper extremity supported, Single extremity supported Standing balance-Leahy Scale: Poor Standing balance comment: Pt stands with wide BOS and sways posteriorly and anteriorly. More sway with eyes closed than open                             Pertinent Vitals/Pain Pain Assessment Pain Assessment: Faces Faces Pain Scale: Hurts little more Pain Location: L arm Pain Descriptors / Indicators: Grimacing, Guarding Pain Intervention(s): Monitored during session    Home Living Family/patient expects to be discharged to:: Private residence Living Arrangements: Spouse/significant other;Children Available Help at Discharge: Family;Friend(s);Available PRN/intermittently (kids 22-59 y.o.) Type of Home: House Home Access: Stairs to enter Entrance Stairs-Rails: None Entrance Stairs-Number of Steps: 2   Home Layout: One level Home Equipment: Standard Walker;Cane - single point;Transport chair;Shower seat;Grab bars - tub/shower;Wheelchair - power (power w/c broken)      Prior Function Prior Level of Function : Driving;History of Falls (last six months);Independent/Modified Independent             Mobility Comments: uses walker with tennis balls for daily ambulation. ADLs Comments: Drives but doesn't regularly do ADLs such as shopping     Extremity/Trunk Assessment   Upper Extremity Assessment Upper Extremity Assessment: Defer to OT evaluation    Lower Extremity Assessment Lower Extremity Assessment: Generalized weakness;RLE deficits/detail;LLE deficits/detail RLE Deficits / Details: Grossly MMT 5/5 RLE Coordination: decreased gross motor LLE Deficits / Details: Gross MMT 4/5 LLE Coordination: decreased gross motor    Cervical / Trunk Assessment Cervical / Trunk Assessment: Kyphotic  Communication   Communication Communication: No apparent difficulties (speech slightly garbled s/t ataxia)    Cognition  Arousal: Alert Behavior During Therapy: WFL for tasks assessed/performed   PT - Cognitive impairments: No apparent impairments                         Following commands: Intact       Cueing Cueing Techniques: Verbal cues, Visual cues     General Comments      Exercises     Assessment/Plan    PT Assessment Patient needs continued PT services  PT Problem List Decreased strength;Decreased range of motion;Decreased activity tolerance;Decreased balance;Decreased mobility;Decreased coordination;Decreased knowledge of use of DME;Decreased safety awareness;Cardiopulmonary status limiting activity       PT Treatment Interventions DME instruction;Gait training;Stair training;Functional mobility training;Therapeutic activities;Therapeutic exercise;Balance training;Neuromuscular re-education;Patient/family education    PT Goals (Current goals can be found in the Care Plan section)  Acute Rehab PT Goals Patient Stated Goal: get better PT Goal Formulation: With patient Time For Goal Achievement: 01/03/24 Potential to Achieve Goals: Good    Frequency Min 1X/week     Co-evaluation               AM-PAC PT "6 Clicks" Mobility  Outcome Measure Help needed turning from your back to your side while in a flat bed without using bedrails?: A Lot Help needed moving from lying on your back to sitting on the side of a flat bed without using bedrails?: A Lot Help needed moving to and from a bed to a chair (including a wheelchair)?: Total Help needed standing up from a chair using your arms (e.g., wheelchair or bedside chair)?: Total Help needed to walk in hospital  room?: Total Help needed climbing 3-5 steps with a railing? : Total 6 Click Score: 8    End of Session Equipment Utilized During Treatment: Gait belt Activity Tolerance: Patient tolerated treatment well Patient left: in bed;with call bell/phone within reach Nurse Communication: Mobility status PT Visit Diagnosis:  Unsteadiness on feet (R26.81);Other abnormalities of gait and mobility (R26.89);Repeated falls (R29.6);Muscle weakness (generalized) (M62.81);History of falling (Z91.81);Ataxic gait (R26.0);Difficulty in walking, not elsewhere classified (R26.2);Other symptoms and signs involving the nervous system (R29.898)    Time: 1610-9604 PT Time Calculation (min) (ACUTE ONLY): 32 min   Charges:   PT Evaluation $PT Eval Moderate Complexity: 1 Mod PT Treatments $Therapeutic Activity: 8-22 mins PT General Charges $$ ACUTE PT VISIT: 1 Visit         [address], SPT   [place] 12/20/2023, 9:29 AM

## 2023-12-20 NOTE — Progress Notes (Signed)
  Inpatient Rehab Admissions Coordinator :  Per therapy recommendations, patient was screened for CIR candidacy by Ottie Glazier RN MSN.  At this time patient appears to be a potential candidate for CIR. I will place a rehab consult per protocol for full assessment. Please call me with any questions.  Ottie Glazier RN MSN Admissions Coordinator [phone]

## 2023-12-20 NOTE — Plan of Care (Signed)

## 2023-12-21 ENCOUNTER — Inpatient Hospital Stay (HOSPITAL_COMMUNITY): Payer: 59

## 2023-12-21 DIAGNOSIS — G119 Hereditary ataxia, unspecified: Secondary | ICD-10-CM | POA: Diagnosis not present

## 2023-12-21 DIAGNOSIS — Z23 Encounter for immunization: Secondary | ICD-10-CM | POA: Diagnosis not present

## 2023-12-21 DIAGNOSIS — S065XAD Traumatic subdural hemorrhage with loss of consciousness status unknown, subsequent encounter: Secondary | ICD-10-CM | POA: Diagnosis not present

## 2023-12-21 DIAGNOSIS — S065XAA Traumatic subdural hemorrhage with loss of consciousness status unknown, initial encounter: Secondary | ICD-10-CM | POA: Diagnosis present

## 2023-12-21 DIAGNOSIS — E1151 Type 2 diabetes mellitus with diabetic peripheral angiopathy without gangrene: Secondary | ICD-10-CM | POA: Diagnosis present

## 2023-12-21 DIAGNOSIS — S43015A Anterior dislocation of left humerus, initial encounter: Secondary | ICD-10-CM | POA: Diagnosis present

## 2023-12-21 DIAGNOSIS — Z886 Allergy status to analgesic agent status: Secondary | ICD-10-CM | POA: Diagnosis not present

## 2023-12-21 DIAGNOSIS — F419 Anxiety disorder, unspecified: Secondary | ICD-10-CM | POA: Diagnosis present

## 2023-12-21 DIAGNOSIS — R296 Repeated falls: Secondary | ICD-10-CM | POA: Diagnosis present

## 2023-12-21 DIAGNOSIS — Y92009 Unspecified place in unspecified non-institutional (private) residence as the place of occurrence of the external cause: Secondary | ICD-10-CM | POA: Diagnosis not present

## 2023-12-21 DIAGNOSIS — D472 Monoclonal gammopathy: Secondary | ICD-10-CM | POA: Diagnosis present

## 2023-12-21 DIAGNOSIS — R0781 Pleurodynia: Secondary | ICD-10-CM | POA: Diagnosis not present

## 2023-12-21 DIAGNOSIS — W010XXA Fall on same level from slipping, tripping and stumbling without subsequent striking against object, initial encounter: Secondary | ICD-10-CM | POA: Diagnosis present

## 2023-12-21 DIAGNOSIS — G4700 Insomnia, unspecified: Secondary | ICD-10-CM | POA: Diagnosis present

## 2023-12-21 DIAGNOSIS — B372 Candidiasis of skin and nail: Secondary | ICD-10-CM | POA: Diagnosis present

## 2023-12-21 DIAGNOSIS — L219 Seborrheic dermatitis, unspecified: Secondary | ICD-10-CM | POA: Diagnosis present

## 2023-12-21 DIAGNOSIS — Z79890 Hormone replacement therapy (postmenopausal): Secondary | ICD-10-CM | POA: Diagnosis not present

## 2023-12-21 DIAGNOSIS — R402142 Coma scale, eyes open, spontaneous, at arrival to emergency department: Secondary | ICD-10-CM | POA: Diagnosis present

## 2023-12-21 DIAGNOSIS — R4701 Aphasia: Secondary | ICD-10-CM | POA: Diagnosis not present

## 2023-12-21 DIAGNOSIS — R402252 Coma scale, best verbal response, oriented, at arrival to emergency department: Secondary | ICD-10-CM | POA: Diagnosis present

## 2023-12-21 DIAGNOSIS — I110 Hypertensive heart disease with heart failure: Secondary | ICD-10-CM | POA: Diagnosis present

## 2023-12-21 DIAGNOSIS — Z7984 Long term (current) use of oral hypoglycemic drugs: Secondary | ICD-10-CM | POA: Diagnosis not present

## 2023-12-21 DIAGNOSIS — I5022 Chronic systolic (congestive) heart failure: Secondary | ICD-10-CM | POA: Diagnosis present

## 2023-12-21 DIAGNOSIS — R402362 Coma scale, best motor response, obeys commands, at arrival to emergency department: Secondary | ICD-10-CM | POA: Diagnosis present

## 2023-12-21 DIAGNOSIS — I69393 Ataxia following cerebral infarction: Secondary | ICD-10-CM | POA: Diagnosis not present

## 2023-12-21 DIAGNOSIS — S20212A Contusion of left front wall of thorax, initial encounter: Secondary | ICD-10-CM

## 2023-12-21 DIAGNOSIS — E785 Hyperlipidemia, unspecified: Secondary | ICD-10-CM | POA: Diagnosis present

## 2023-12-21 DIAGNOSIS — S069X0A Unspecified intracranial injury without loss of consciousness, initial encounter: Secondary | ICD-10-CM | POA: Diagnosis not present

## 2023-12-21 DIAGNOSIS — F17210 Nicotine dependence, cigarettes, uncomplicated: Secondary | ICD-10-CM | POA: Diagnosis present

## 2023-12-21 DIAGNOSIS — E039 Hypothyroidism, unspecified: Secondary | ICD-10-CM | POA: Diagnosis present

## 2023-12-21 DIAGNOSIS — Z8673 Personal history of transient ischemic attack (TIA), and cerebral infarction without residual deficits: Secondary | ICD-10-CM | POA: Diagnosis not present

## 2023-12-21 DIAGNOSIS — R42 Dizziness and giddiness: Secondary | ICD-10-CM | POA: Diagnosis not present

## 2023-12-21 DIAGNOSIS — S022XXB Fracture of nasal bones, initial encounter for open fracture: Secondary | ICD-10-CM | POA: Diagnosis present

## 2023-12-21 LAB — BASIC METABOLIC PANEL
Anion gap: 14 (ref 5–15)
BUN: 16 mg/dL (ref 6–20)
CO2: 24 mmol/L (ref 22–32)
Calcium: 9.3 mg/dL (ref 8.9–10.3)
Chloride: 98 mmol/L (ref 98–111)
Creatinine, Ser: 1.27 mg/dL — ABNORMAL HIGH (ref 0.61–1.24)
GFR, Estimated: >60 mL/min (ref >60)
Glucose, Bld: 129 mg/dL — ABNORMAL HIGH (ref 70–99)
Potassium: 4.1 mmol/L (ref 3.5–5.1)
Sodium: 136 mmol/L (ref 135–145)

## 2023-12-21 LAB — GLUCOSE, CAPILLARY
Glucose-Capillary: 144 mg/dL — ABNORMAL HIGH (ref 70–99)
Glucose-Capillary: 169 mg/dL — ABNORMAL HIGH (ref 70–99)
Glucose-Capillary: 187 mg/dL — ABNORMAL HIGH (ref 70–99)
Glucose-Capillary: 176 mg/dL — ABNORMAL HIGH (ref 70–99)
Glucose-Capillary: 133 mg/dL — ABNORMAL HIGH (ref 70–99)

## 2023-12-21 MED ORDER — POLYETHYLENE GLYCOL 3350 17 G PO PACK
17.0000 g | PACK | Freq: Every day | ORAL | Status: DC
  Administered 2023-12-21 – 2023-12-25 (×5): 17 g via ORAL
  Filled 2023-12-21 (×5): qty 1

## 2023-12-21 MED ORDER — OXYCODONE-ACETAMINOPHEN 7.5-325 MG PO TABS
1.0000 | ORAL_TABLET | Freq: Three times a day (TID) | ORAL | Status: DC
  Administered 2023-12-21 – 2023-12-23 (×7): 1 via ORAL
  Filled 2023-12-21 (×7): qty 1

## 2023-12-21 MED ORDER — SENNOSIDES-DOCUSATE SODIUM 8.6-50 MG PO TABS
2.0000 | ORAL_TABLET | Freq: Two times a day (BID) | ORAL | Status: DC
  Administered 2023-12-21 – 2023-12-25 (×8): 2 via ORAL
  Filled 2023-12-21 (×9): qty 2

## 2023-12-21 NOTE — Progress Notes (Addendum)
 [facility] PROGRESS NOTE   Dickson Kostelnik MWU:[id number] DOB: August 22, 1966 DOA: 12/19/2023  PCP: Jarrett Soho, PA-C  Brief History: 58 y.o. male with medical history significant for anxiety, insomnia, ataxia, history of CVA, hypothyroidism, hypertension, PAD, and chronic HFrEF who presented with injuries to his nose and right shoulder after a fall at home.  Patient has had ataxia since 2018.  Uses a walker at home.  Had a mechanical fall.  Found to have small subdural hematoma, left shoulder dislocation and mildly displaced nasal bone fracture.  Consultants: Phone discussion with the ENT, orthopedics, neurosurgery.  Procedures: Suture of laceration over nose    Subjective/Interval History: Patient developed some shortness of breath overnight.  Complains of pain in the left side of his chest where he sustained injury to his ribs.  Pain worsens when he coughs or takes a deep breath.     Assessment/Plan:  Subdural hematoma Secondary to mechanical fall.  CT head was repeated and showed stable findings.  Neurologically patient is stable. Imaging studies were reviewed by Dr. Jordan Likes with neurosurgery.  He recommends holding Plavix for 1 week.    Nasal bone fracture with mild displacement Discussed with Dr. Jearld Fenton with ENT who recommends follow-up in 1 week.  Also recommended cephalexin which is being administered.  Laceration over nose This was sutured.  Will need suture removal in 5 days.  He has been given Tdap.  Left shoulder dislocation This was reduced in the emergency department.  Discussed with orthopedics.  Has to be nonweightbearing and in a sling for 2 weeks.  Outpatient follow-up with orthopedics in 2 weeks. Pain is poorly controlled.  Will add scheduled Percocet.  Will add bowel regimen.  Cough with left-sided chest pain Most likely due to injury sustained by his fall.  Will repeat chest x-ray.  He was given a dose of IV furosemide last night with which she had good  diuresis. Incentive spirometry.  Pain control.  Chronic systolic CHF LVEF is 20 to 25% based on echo done in January 2025.  Appears to be compensated.  Continue with home medications.  Noted to be on Aldactone Entresto Jardiance and metoprolol.   Was given a dose of IV furosemide yesterday with which he diuresed well.  Respiratory status is stable.  Noted to be on oxygen.  Renal function stable for the most part.   Telemetry shows sinus tachycardia.  EKG from last night reviewed which showed PVCs as well.  Heart rate likely elevated due to pain issues.  Continue to monitor on telemetry for now.  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  Uses a walker at home.  Now with immobility of his left arm he will have significant difficulty at home.  Does not think he will be able to do well at home. Seen by physical therapy who recommends inpatient rehab.  Hypothyroidism Continue with levothyroxine.  TSH was 11.5 in January.  History of stroke Holding Plavix due to SDH.  Continue statin.  Peripheral artery disease Holding Plavix.  Continue statin.   DVT Prophylaxis: SCDs only for now Code Status: Full code Family Communication: Discussed with patient Disposition Plan: To be determined  Due to persistent pain requiring parenteral agents he likely needs to be inpatient status.    Medications: Scheduled:  cephALEXin  500 mg Oral Q6H   empagliflozin  10 mg Oral Daily   febuxostat  80 mg Oral QPM   insulin aspart  0-5 Units Subcutaneous QHS   insulin aspart  0-6  Units Subcutaneous TID WC   levothyroxine  200 mcg Oral QHS   metoprolol succinate  50 mg Oral QHS   oxyCODONE-acetaminophen  1 tablet Oral TID   polyethylene glycol  17 g Oral Daily   pravastatin  80 mg Oral Daily   sacubitril-valsartan  1 tablet Oral BID   senna-docusate  2 tablet Oral BID   sodium chloride flush  3 mL Intravenous Q12H   spironolactone  12.5 mg Oral Daily   temazepam  30 mg Oral QHS    Continuous: QMV:[id number] **OR** acetaminophen, benzonatate, clonazePAM, HYDROmorphone (DILAUDID) injection, metoprolol tartrate, oxyCODONE  Antibiotics: Anti-infectives (From admission, onward)    Start     Dose/Rate Route Frequency Ordered Stop   12/20/23 0100  cephALEXin (KEFLEX) capsule 500 mg        500 mg Oral Every 6 hours 12/20/23 0059     12/19/23 1445  cephALEXin (KEFLEX) capsule 500 mg        500 mg Oral  Once 12/19/23 1444 12/19/23 1505   12/19/23 0000  cephALEXin (KEFLEX) 500 MG capsule        500 mg Oral 4 times daily 12/19/23 1449         Objective:  Vital Signs  Vitals:   12/21/23 0435 12/21/23 0534 12/21/23 0637 12/21/23 0758  BP: 138/85  (!) 146/87 (!) 118/93  Pulse: (!) 115  (!) 115 (!) 110  Resp: 18  18 18   Temp: 98.5 F (36.9 C)  98.6 F (37 C) 98.1 F (36.7 C)  TempSrc:   Oral Oral  SpO2: 90% 94% 96% 94%  Weight:      Height:        Intake/Output Summary (Last 24 hours) at 12/21/2023 0932 Last data filed at 12/21/2023 0553 Gross per 24 hour  Intake 480 ml  Output 1550 ml  Net -1070 ml   Filed Weights   12/19/23 0954  Weight: 89 kg    General appearance: Awake alert.  In no distress Resp: Diminished entry at the bases with few crackles bilaterally. Tender to palpation over the left chest wall.  No bruising noted. Cardio: S1-S2 is normal regular.  No S3-S4.  No rubs murmurs or bruit GI: Abdomen is soft.  Nontender nondistended.  Bowel sounds are present normal.  No masses organomegaly Extremities: No edema.  Left arm is in a sling Neurologic: Alert and oriented x3.  No focal neurological deficits.     Lab Results:  Data Reviewed: I have personally reviewed following labs and reports of the imaging studies  CBC: Recent Labs  Lab 12/19/23 1114 12/20/23 0418  WBC 12.6* 8.9  HGB 14.4 12.0*  HCT 42.5 34.8*  MCV 86.7 87.7  PLT 250 234    Basic Metabolic Panel: Recent Labs  Lab 12/19/23 1114 12/20/23 0418 12/21/23 0710   NA 134* 135 136  K 4.8 4.0 4.1  CL 104 106 98  CO2 23 22 24   GLUCOSE 192* 153* 129*  BUN 16 15 16   CREATININE 1.24 1.22 1.27*  CALCIUM 9.1 8.7* 9.3    GFR: Estimated Creatinine Clearance: 69.9 mL/min (A) (by C-G formula based on SCr of 1.27 mg/dL (H)).   CBG: Recent Labs  Lab 12/20/23 0740 12/20/23 1254 12/20/23 1453 12/20/23 1922 12/21/23 0755  GLUCAP 126* 135* 145* 167* 144*    Radiology Studies: CT Head Wo Contrast Result Date: 12/19/2023 CLINICAL DATA:  Head trauma with coagulopathy. Standing fall to the left side with blood thinners. Laceration to the nose. EXAM:  CT HEAD WITHOUT CONTRAST TECHNIQUE: Contiguous axial images were obtained from the base of the skull through the vertex without intravenous contrast. RADIATION DOSE REDUCTION: This exam was performed according to the departmental dose-optimization program which includes automated exposure control, adjustment of the mA and/or kV according to patient size and/or use of iterative reconstruction technique. COMPARISON:  12/19/2023 at 11:41 a.m. FINDINGS: Brain: Small focal area of increased attenuation again demonstrated in the right side of the falx measuring 4 mm in depth. This may represent a focal subdural hemorrhage. No change since the prior study from earlier today. No new hemorrhage or progression. No mass-effect or midline shift. Mild cerebral atrophy. Ventricular dilatation consistent with central atrophy. Low-attenuation changes in the deep white matter consistent with chronic small vessel ischemia. Old lacunar infarct in the right cerebellum. Vascular: No hyperdense vessel or unexpected calcification. Skull: Calvarium appears intact. No acute depressed skull fractures. Depressed nasal bone fractures are again demonstrated without progression. Circumscribed cystic lesion in the anterior maxilla is unchanged, possibly a dentigerous cyst. Sinuses/Orbits: Mucosal thickening in the paranasal sinuses. Mastoid air cells are  clear. Other: None. IMPRESSION: 1. No significant change since prior study from earlier today. 2. Again demonstrated is a small focal subdural hemorrhage along the right side of the falx measuring 4 mm. No progression or significant mass-effect. 3. Chronic atrophy and small vessel ischemic changes. 4. Depressed nasal bone fractures are again seen. 5. Cystic structure in the anterior maxilla likely representing dentigerous cyst. Electronically Signed   By: Burman Nieves M.D.   On: 12/19/2023 19:18   DG Shoulder Left Result Date: 12/19/2023 CLINICAL DATA:  Status post left shoulder dislocation reduction. EXAM: LEFT SHOULDER - 2+ VIEW COMPARISON:  Earlier today. FINDINGS: The previously demonstrated anterior shoulder dislocation has been reduced. No visible fracture. Mild greater tuberosity hyperostosis. IMPRESSION: 1. Reduction of the previously demonstrated anterior shoulder dislocation. 2. Mild greater tuberosity hyperostosis. This can be seen with chronic rotator cuff tendinopathy. Electronically Signed   By: Beckie Salts M.D.   On: 12/19/2023 15:05   CT Head Wo Contrast Result Date: 12/19/2023 CLINICAL DATA:  Head trauma, coagulopathy (Age 73-64y); Facial trauma, blunt; Neck trauma, dangerous injury mechanism (Age 57-64y) EXAM: CT HEAD WITHOUT CONTRAST CT MAXILLOFACIAL WITHOUT CONTRAST CT CERVICAL SPINE WITHOUT CONTRAST TECHNIQUE: Multidetector CT imaging of the head, cervical spine, and maxillofacial structures were performed using the standard protocol without intravenous contrast. Multiplanar CT image reconstructions of the cervical spine and maxillofacial structures were also generated. RADIATION DOSE REDUCTION: This exam was performed according to the departmental dose-optimization program which includes automated exposure control, adjustment of the mA and/or kV according to patient size and/or use of iterative reconstruction technique. COMPARISON:  None Available. FINDINGS: CT HEAD FINDINGS Brain:  Small area of subdural hemorrhage along the anterior falx measuring 4 mm (series 5, image 46). No hydrocephalus. No mass effect. No mass lesion. 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 No CT evidence of an acute cortical infarct. Vascular: No hyperdense vessel or unexpected calcification. Skull: Normal. Negative for fracture or focal lesion. Other: None. CT MAXILLOFACIAL FINDINGS Osseous: Mildly displaced fracture of the nasal bone on the left with overlying soft tissue swelling. Fracture may also involve the bony nasal septum. Orbits: Negative. No traumatic or inflammatory finding. Sinuses: No middle ear or mastoid effusion. Paranasal sinuses are notable for pansinus mucosal thickening. Soft tissues: Soft tissue swelling overlying the left-sided nasal bone fracture. CT CERVICAL SPINE FINDINGS Alignment: Normal. Skull base and vertebrae: No acute fracture. No  primary bone lesion or focal pathologic process. Soft tissues and spinal canal: No prevertebral fluid or swelling. No visible canal hematoma. Disc levels:  No CT evidence of high-grade spinal canal stenosis Upper chest: Negative. Other: None IMPRESSION: 1. Small area of subdural hemorrhage along the anterior falx measuring 4 mm. No mass effect. 2. Mildly displaced fracture of the nasal bone on the left with overlying soft tissue swelling. Fracture may also involve the bony nasal septum. 3. No acute fracture or traumatic subluxation of the cervical spine. Electronically Signed   By: Lorenza Cambridge M.D.   On: 12/19/2023 12:34   CT Cervical Spine Wo Contrast Result Date: 12/19/2023 CLINICAL DATA:  Head trauma, coagulopathy (Age 73-64y); Facial trauma, blunt; Neck trauma, dangerous injury mechanism (Age 42-64y) EXAM: CT HEAD WITHOUT CONTRAST CT MAXILLOFACIAL WITHOUT CONTRAST CT CERVICAL SPINE WITHOUT CONTRAST TECHNIQUE: Multidetector CT imaging of the head, cervical spine, and maxillofacial structures were performed using the standard protocol without  intravenous contrast. Multiplanar CT image reconstructions of the cervical spine and maxillofacial structures were also generated. RADIATION DOSE REDUCTION: This exam was performed according to the departmental dose-optimization program which includes automated exposure control, adjustment of the mA and/or kV according to patient size and/or use of iterative reconstruction technique. COMPARISON:  None Available. FINDINGS: CT HEAD FINDINGS Brain: Small area of subdural hemorrhage along the anterior falx measuring 4 mm (series 5, image 46). No hydrocephalus. No mass effect. No mass lesion. 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 No CT evidence of an acute cortical infarct. Vascular: No hyperdense vessel or unexpected calcification. Skull: Normal. Negative for fracture or focal lesion. Other: None. CT MAXILLOFACIAL FINDINGS Osseous: Mildly displaced fracture of the nasal bone on the left with overlying soft tissue swelling. Fracture may also involve the bony nasal septum. Orbits: Negative. No traumatic or inflammatory finding. Sinuses: No middle ear or mastoid effusion. Paranasal sinuses are notable for pansinus mucosal thickening. Soft tissues: Soft tissue swelling overlying the left-sided nasal bone fracture. CT CERVICAL SPINE FINDINGS Alignment: Normal. Skull base and vertebrae: No acute fracture. No primary bone lesion or focal pathologic process. Soft tissues and spinal canal: No prevertebral fluid or swelling. No visible canal hematoma. Disc levels:  No CT evidence of high-grade spinal canal stenosis Upper chest: Negative. Other: None IMPRESSION: 1. Small area of subdural hemorrhage along the anterior falx measuring 4 mm. No mass effect. 2. Mildly displaced fracture of the nasal bone on the left with overlying soft tissue swelling. Fracture may also involve the bony nasal septum. 3. No acute fracture or traumatic subluxation of the cervical spine. Electronically Signed   By: Lorenza Cambridge M.D.   On:  12/19/2023 12:34   CT Maxillofacial Wo Contrast Result Date: 12/19/2023 CLINICAL DATA:  Head trauma, coagulopathy (Age 20-64y); Facial trauma, blunt; Neck trauma, dangerous injury mechanism (Age 47-64y) EXAM: CT HEAD WITHOUT CONTRAST CT MAXILLOFACIAL WITHOUT CONTRAST CT CERVICAL SPINE WITHOUT CONTRAST TECHNIQUE: Multidetector CT imaging of the head, cervical spine, and maxillofacial structures were performed using the standard protocol without intravenous contrast. Multiplanar CT image reconstructions of the cervical spine and maxillofacial structures were also generated. RADIATION DOSE REDUCTION: This exam was performed according to the departmental dose-optimization program which includes automated exposure control, adjustment of the mA and/or kV according to patient size and/or use of iterative reconstruction technique. COMPARISON:  None Available. FINDINGS: CT HEAD FINDINGS Brain: Small area of subdural hemorrhage along the anterior falx measuring 4 mm (series 5, image 46). No hydrocephalus. No mass effect. No mass  lesion. 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 No CT evidence of an acute cortical infarct. Vascular: No hyperdense vessel or unexpected calcification. Skull: Normal. Negative for fracture or focal lesion. Other: None. CT MAXILLOFACIAL FINDINGS Osseous: Mildly displaced fracture of the nasal bone on the left with overlying soft tissue swelling. Fracture may also involve the bony nasal septum. Orbits: Negative. No traumatic or inflammatory finding. Sinuses: No middle ear or mastoid effusion. Paranasal sinuses are notable for pansinus mucosal thickening. Soft tissues: Soft tissue swelling overlying the left-sided nasal bone fracture. CT CERVICAL SPINE FINDINGS Alignment: Normal. Skull base and vertebrae: No acute fracture. No primary bone lesion or focal pathologic process. Soft tissues and spinal canal: No prevertebral fluid or swelling. No visible canal hematoma. Disc levels:  No CT  evidence of high-grade spinal canal stenosis Upper chest: Negative. Other: None IMPRESSION: 1. Small area of subdural hemorrhage along the anterior falx measuring 4 mm. No mass effect. 2. Mildly displaced fracture of the nasal bone on the left with overlying soft tissue swelling. Fracture may also involve the bony nasal septum. 3. No acute fracture or traumatic subluxation of the cervical spine. Electronically Signed   By: Lorenza Cambridge M.D.   On: 12/19/2023 12:34   DG Shoulder Left Result Date: 12/19/2023 CLINICAL DATA:  Fall.  Left shoulder deformity. EXAM: LEFT SHOULDER - 2+ VIEW COMPARISON:  None Available. FINDINGS: The humeral head is anteriorly inferiorly and medially dislocated with respect to the glenoid fossa. Mild inferior humeral head-neck junction and mild inferior glenoid degenerative osteophytosis. Moderate acromioclavicular joint space narrowing with mild peripheral osteophytosis. Mild-to-moderate distal lateral subacromial spurring. No acute fracture is seen. The visualized portion of the left lung is unremarkable. IMPRESSION: 1. Anterior, inferior, and medial dislocation of the humeral head with respect to the glenoid fossa. 2. 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 Electronically Signed   By: Neita Garnet M.D.   On: 12/19/2023 12:00   DG Chest 1 View Result Date: 12/19/2023 CLINICAL DATA:  Fall.  Left shoulder deformity. EXAM: CHEST  1 VIEW COMPARISON:  Chest radiographs 11/14/2023 FINDINGS: Cardiac silhouette and mediastinal contours are within normal limits. The lungs are clear. No pleural effusion or pneumothorax. Anterior dislocation of the left shoulder as also seen on dedicated radiographs. Moderate dextrocurvature of the midthoracic spine with mild-to-moderate multilevel degenerative disc changes. IMPRESSION: 1.  No acute cardiopulmonary disease process. 2. Anterior dislocation of the left shoulder. Electronically Signed   By: Neita Garnet M.D.   On:  12/19/2023 11:57       LOS: 0 days   Beldon Nowling Rito Ehrlich  [facility] Pager on [email or web address]  12/21/2023, 9:32 AM

## 2023-12-21 NOTE — Progress Notes (Signed)
 Inpatient Rehab Coordinator Note:  I met with patient and his family at bedside to discuss CIR recommendations and goals/expectations of CIR stay.  We reviewed 3 hrs/day of therapy, physician follow up, and average length of stay 2 weeks (dependent upon progress) with goals of supervision.  Family can provide 24/7 supervision at discharge.  We reviewed need for prior auth with [employer] and I will start that process today.   Estill Dooms, PT, DPT Admissions Coordinator [phone] 12/21/23  5:12 PM

## 2023-12-21 NOTE — Progress Notes (Signed)
Incentive spirometry introduced with teach back 

## 2023-12-21 NOTE — Progress Notes (Signed)
 Transition of Care [facility]) - CAGE-AID Screening   Patient Details  Name: Dustin Sampson MRN: [id number] Date of Birth: 11/22/1965  [facility] of [facility]) CM/SW Contact:    Leota Sauers, RN Phone Number: 12/21/2023, 5:57 AM   Clinical Narrative:  Patient denies the use of alcohol and illicit substances. Resources not given at this time.  CAGE-AID Screening:    Have You Ever Felt You Ought to Cut Down on Your Drinking or Drug Use?: No Have People Annoyed You By Critizing Your Drinking Or Drug Use?: No Have You Felt Bad Or Guilty About Your Drinking Or Drug Use?: No Have You Ever Had a Drink or Used Drugs First Thing In The Morning to Steady Your Nerves or to Get Rid of a Hangover?: No CAGE-AID Score: 0  Substance Abuse Education Offered: No

## 2023-12-21 NOTE — Plan of Care (Signed)
  Problem: Health Behavior/Discharge Planning: Goal: Ability to identify and utilize available resources and services will improve Outcome: Progressing Goal: Ability to manage health-related needs will improve Outcome: Progressing   Problem: Skin Integrity: Goal: Risk for impaired skin integrity will decrease Outcome: Progressing   Problem: Education: Goal: Knowledge of General Education information will improve Description: Including pain rating scale, medication(s)/side effects and non-pharmacologic comfort measures Outcome: Progressing   Problem: Health Behavior/Discharge Planning: Goal: Ability to manage health-related needs will improve Outcome: Progressing   Problem: Activity: Goal: Risk for activity intolerance will decrease Outcome: Progressing   Problem: Coping: Goal: Level of anxiety will decrease Outcome: Progressing   Problem: Safety: Goal: Ability to remain free from injury will improve Outcome: Progressing   Problem: Skin Integrity: Goal: Risk for impaired skin integrity will decrease Outcome: Progressing

## 2023-12-21 NOTE — Progress Notes (Signed)
 Mobility Specialist: Progress Note   12/21/23 1619  Mobility  Activity Transferred from chair to bed  Level of Assistance Minimal assist, patient does 75% or more  Assistive Device Other (Comment) (HHA)  LUE Weight Bearing Per Provider Order NWB  Activity Response Tolerated well  Mobility Referral Yes  Mobility visit 1 Mobility  Mobility Specialist Start Time (ACUTE ONLY) 1415  Mobility Specialist Stop Time (ACUTE ONLY) 1418  Mobility Specialist Time Calculation (min) (ACUTE ONLY) 3 min    Pt requesting to get back to bed - received in chair. MinA for STS and stand pivot to bed with HHA. No complaints. Left in bed with all needs met, call bell in reach.   Maurene Capes Mobility Specialist Please contact via SecureChat or Rehab office at [phone]

## 2023-12-21 NOTE — Progress Notes (Signed)
 Occupational Therapy Treatment Patient Details Name: Dustin Sampson MRN: [id number] DOB: [birth year] Today's Date: 12/21/2023   History of present illness Pt is a 58 y.o. male presenting 12/19/23 s/p a fall onto his nose/face and right shoulder dislocation. Imaging: Depressed nasal bone fractures and subdermal hematoma. Of note, pt's shoulder was reducted 12/19/23. PMHx anxiety, insomnia, ataxia, history of CVA, hypothyroidism, hypertension, PAD, frequent falls, and chronic HFrEF   OT comments  Pt making good progress towards OT goals and eager to participate. With LUE restrictions, introduced hemiwalker with pt able to mobilize in room with Min A x 2 and cues for DME use. Pt able to manage basic UB ADLs seated at sink with Min A. Pt reports ataxia impacts vary from day to day but glad to see improvements from yesterday. Son entering during session. Both hopeful for intensive rehab services to maximize safety and independence as pt reports he has "never been this bad before".       If plan is discharge home, recommend the following:  A lot of help with walking and/or transfers;A lot of help with bathing/dressing/bathroom;Assistance with cooking/housework;Assist for transportation;Help with stairs or ramp for entrance   Equipment Recommendations  Other (comment) (TBD; potentially hemiwalker)    Recommendations for Other Services Rehab consult    Precautions / Restrictions Precautions Precautions: Fall Shoulder Interventions: Shoulder sling/immobilizer Recall of Precautions/Restrictions: Intact Precaution/Restrictions Comments: ataxia Required Braces or Orthoses: Sling Restrictions Weight Bearing Restrictions Per Provider Order: Yes LUE Weight Bearing Per Provider Order: Non weight bearing Other Position/Activity Restrictions: sling for 2 weeks       Mobility Bed Mobility Overal bed mobility: Needs Assistance Bed Mobility: Supine to Sit     Supine to sit: Min assist, HOB elevated      General bed mobility comments: handheld assist to lift trunk    Transfers Overall transfer level: Needs assistance Equipment used: Hemi-walker Transfers: Sit to/from Stand, Bed to chair/wheelchair/BSC Sit to Stand: Min assist, +2 safety/equipment     Step pivot transfers: Min assist, +2 physical assistance, +2 safety/equipment     General transfer comment: Cues for hemiwalker use to stand and pivot to recliner, Min A x 2 for safety and intermittent +2 physical assist to correct balance     Balance Overall balance assessment: Needs assistance Sitting-balance support: No upper extremity supported, Single extremity supported, Feet supported Sitting balance-Leahy Scale: Fair     Standing balance support: During functional activity, Reliant on assistive device for balance, No upper extremity supported, Single extremity supported Standing balance-Leahy Scale: Poor                             ADL either performed or assessed with clinical judgement   ADL Overall ADL's : Needs assistance/impaired     Grooming: Minimal assistance;Sitting;Oral care;Wash/dry face Grooming Details (indicate cue type and reason): assist to open small containers but pt able to manage brushing teeth seated at sink.         Upper Body Dressing : Moderate assistance;Sitting                   Functional mobility during ADLs: Minimal assistance;+2 for safety/equipment (hemiwalker) General ADL Comments: Pt able to mobilize to sink in room with trial of hemiwalker and close chair follow. pt reporting ataxia varies and feeling better than yesterday. good carryover of cues on hemiwalker use    Extremity/Trunk Assessment Upper Extremity Assessment Upper Extremity Assessment: LUE deficits/detail;Right hand dominant  LUE Deficits / Details: L shoulder reduced, dislocated from fall. NWB. encouraged gentle AROM of hand, wrist and elbow as tolerated to decrease stiffness. educated on positioning  and assisted with sling adjustment LUE: Unable to fully assess due to pain   Lower Extremity Assessment Lower Extremity Assessment: Defer to PT evaluation        Vision   Vision Assessment?: No apparent visual deficits   Perception     Praxis     Communication Communication Communication: No apparent difficulties   Cognition Arousal: Alert Behavior During Therapy: WFL for tasks assessed/performed Cognition: No apparent impairments                               Following commands: Intact        Cueing   Cueing Techniques: Verbal cues  Exercises      Shoulder Instructions       General Comments Son entering during session, supportive    Pertinent Vitals/ Pain       Pain Assessment Pain Assessment: Faces Faces Pain Scale: Hurts little more Pain Location: L shoulder, L ribs when coughing Pain Descriptors / Indicators: Grimacing, Guarding, Constant, Aching Pain Intervention(s): Monitored during session, Limited activity within patient's tolerance, RN gave pain meds during session  Home Living                                          Prior Functioning/Environment              Frequency  Min 1X/week        Progress Toward Goals  OT Goals(current goals can now be found in the care plan section)  Progress towards OT goals: Progressing toward goals  Acute Rehab OT Goals Patient Stated Goal: do whatever i need to do to get better OT Goal Formulation: With patient Time For Goal Achievement: 01/04/24 Potential to Achieve Goals: Good ADL Goals Pt Will Perform Upper Body Dressing: with contact guard assist;sitting Pt Will Perform Lower Body Dressing: with min assist;sit to/from stand Pt Will Transfer to Toilet: with contact guard assist Pt Will Perform Toileting - Clothing Manipulation and hygiene: with set-up;with supervision  Plan      Co-evaluation                 AM-PAC OT "6 Clicks" Daily Activity      Outcome Measure   Help from another person eating meals?: A Little Help from another person taking care of personal grooming?: A Little Help from another person toileting, which includes using toliet, bedpan, or urinal?: A Lot Help from another person bathing (including washing, rinsing, drying)?: A Lot Help from another person to put on and taking off regular upper body clothing?: A Lot Help from another person to put on and taking off regular lower body clothing?: A Lot 6 Click Score: 14    End of Session Equipment Utilized During Treatment: Gait belt;Other (comment) (hemiwalker)  OT Visit Diagnosis: Unsteadiness on feet (R26.81);Other abnormalities of gait and mobility (R26.89);Repeated falls (R29.6);Muscle weakness (generalized) (M62.81);History of falling (Z91.81);Pain Pain - Right/Left: Left Pain - part of body: Shoulder;Arm   Activity Tolerance Patient tolerated treatment well   Patient Left in chair;with call bell/phone within reach;with chair alarm set;with family/visitor present   Nurse Communication Mobility status        Time: 1191-4782  OT Time Calculation (min): 32 min  Charges: OT General Charges $OT Visit: 1 Visit OT Treatments $Self Care/Home Management : 8-22 mins $Therapeutic Activity: 8-22 mins  Bradd Canary, OTR/L Acute Rehab Services Office: [phone]   Lorre Munroe 12/21/2023, 12:25 PM

## 2023-12-22 DIAGNOSIS — S20212A Contusion of left front wall of thorax, initial encounter: Secondary | ICD-10-CM | POA: Diagnosis not present

## 2023-12-22 DIAGNOSIS — S065XAA Traumatic subdural hemorrhage with loss of consciousness status unknown, initial encounter: Secondary | ICD-10-CM | POA: Diagnosis not present

## 2023-12-22 DIAGNOSIS — S43015A Anterior dislocation of left humerus, initial encounter: Secondary | ICD-10-CM | POA: Diagnosis not present

## 2023-12-22 LAB — CBC
HCT: 37.7 % — ABNORMAL LOW (ref 39.0–52.0)
Hemoglobin: 12.6 g/dL — ABNORMAL LOW (ref 13.0–17.0)
MCH: 28.8 pg (ref 26.0–34.0)
MCHC: 33.4 g/dL (ref 30.0–36.0)
MCV: 86.3 fL (ref 80.0–100.0)
Platelets: 290 10*3/uL (ref 150–400)
RBC: 4.37 MIL/uL (ref 4.22–5.81)
RDW: 14 % (ref 11.5–15.5)
WBC: 10 10*3/uL (ref 4.0–10.5)
nRBC: 0 % (ref 0.0–0.2)

## 2023-12-22 LAB — T4, FREE: Free T4: 1.38 ng/dL — ABNORMAL HIGH (ref 0.61–1.12)

## 2023-12-22 LAB — BASIC METABOLIC PANEL
Anion gap: 14 (ref 5–15)
BUN: 23 mg/dL — ABNORMAL HIGH (ref 6–20)
CO2: 23 mmol/L (ref 22–32)
Calcium: 9 mg/dL (ref 8.9–10.3)
Chloride: 99 mmol/L (ref 98–111)
Creatinine, Ser: 1.32 mg/dL — ABNORMAL HIGH (ref 0.61–1.24)
GFR, Estimated: >60 mL/min (ref >60)
Glucose, Bld: 154 mg/dL — ABNORMAL HIGH (ref 70–99)
Potassium: 3.9 mmol/L (ref 3.5–5.1)
Sodium: 136 mmol/L (ref 135–145)

## 2023-12-22 LAB — TSH: TSH: 5.203 u[IU]/mL — ABNORMAL HIGH (ref 0.350–4.500)

## 2023-12-22 LAB — GLUCOSE, CAPILLARY
Glucose-Capillary: 193 mg/dL — ABNORMAL HIGH (ref 70–99)
Glucose-Capillary: 173 mg/dL — ABNORMAL HIGH (ref 70–99)
Glucose-Capillary: 168 mg/dL — ABNORMAL HIGH (ref 70–99)
Glucose-Capillary: 164 mg/dL — ABNORMAL HIGH (ref 70–99)

## 2023-12-22 MED ORDER — ENSURE ENLIVE PO LIQD
237.0000 mL | Freq: Two times a day (BID) | ORAL | Status: DC
  Administered 2023-12-23: 237 mL via ORAL

## 2023-12-22 NOTE — H&P (Shared)
 Physical Medicine and Rehabilitation Admission H&P    Chief Complaint  Patient presents with   Functional deficits due to SDH/gait d/o    HPI:  Dustin Sampson is a 58 year old  male with history of CVA with ataxia, HFrEF, insomnia, anxiety d/o, PAD, frequent falls who was admitted on 12/19/23 after tripping and falling onto and complaints of nose and right shoulder pain with epistaxis. He was not using walker when he fell and noted to have shoulder deformity at admission. Work up revealed small area of SDH along anterior falx and minimally displaced nasal Fx on left. Dr.Pool recommended repeat CT for monitoring and if stable OK for d/c as well as holding Plavix for 7 days. Nasal laceration repaired and patient started on Keflex per discussion with Dr. Jearld Fenton who recommends follow up in one week. Left shoulder dislocation reduced and to be NWB with ortho follow up in 2 weeks.   He developed SOB requiring oxygen on 02/27 with reports of left chest pain as well as cough. Has also had tachycardia felt to be pain mediated. CXR was negative. PT/OT consulted and patient noted to require min to mod assist due to wide based staggering gait as well as anxiety. Marland Kitchen CIR recommended due to functional decline.    .   Review of Systems  Musculoskeletal:  Positive for falls.    Past Medical History:  Diagnosis Date   Anxiety    Arthritis    Diabetes mellitus without complication (HCC)    Gout    Hyperlipemia    Hypertension    Hypothyroidism    Insomnia    Pneumonia    Stroke [facility])    Thyroid disease     Past Surgical History:  Procedure Laterality Date   ABDOMINAL AORTOGRAM W/LOWER EXTREMITY Bilateral 08/21/2020   Procedure: ABDOMINAL AORTOGRAM W/LOWER EXTREMITY;  Surgeon: Sherren Kerns, MD;  Location: [facility];  Service: Cardiovascular;  Laterality: Bilateral;   ABDOMINAL AORTOGRAM W/LOWER EXTREMITY N/A 09/04/2020   Procedure: ABDOMINAL AORTOGRAM W/LOWER EXTREMITY;  Surgeon:  Sherren Kerns, MD;  Location: [facility];  Service: Cardiovascular;  Laterality: N/A;   COLONOSCOPY     x2   FEMORAL ARTERY EXPLORATION Left 09/04/2020   Procedure: FEMORAL ARTERY EXPLORATION;  Surgeon: Chuck Hint, MD;  Location: [facility];  Service: Vascular;  Laterality: Left;   NASAL SINUS SURGERY     PERIPHERAL VASCULAR INTERVENTION Left 08/21/2020   Procedure: PERIPHERAL VASCULAR INTERVENTION;  Surgeon: Sherren Kerns, MD;  Location: [facility];  Service: Cardiovascular;  Laterality: Left;  sfa stent x 3   PERIPHERAL VASCULAR INTERVENTION  09/04/2020   Procedure: PERIPHERAL VASCULAR INTERVENTION;  Surgeon: Sherren Kerns, MD;  Location: [facility];  Service: Cardiovascular;;  Right SFA   RIGHT/LEFT HEART CATH AND CORONARY ANGIOGRAPHY N/A 11/16/2023   Procedure: RIGHT/LEFT HEART CATH AND CORONARY ANGIOGRAPHY;  Surgeon: Yates Decamp, MD;  Location: [facility];  Service: Cardiovascular;  Laterality: N/A;   SHOULDER ARTHROSCOPY WITH ROTATOR CUFF REPAIR AND SUBACROMIAL DECOMPRESSION Left 02/04/2021   Procedure: SHOULDER ARTHROSCOPY WITH ROTATOR CUFF REPAIR AND SUBACROMIAL DECOMPRESSION;  Surgeon: Jones Broom, MD;  Location: [facility] ORS;  Service: Orthopedics;  Laterality: Left;   WISDOM TOOTH EXTRACTION     WRIST SURGERY     bilat    Family History  Problem Relation Age of Onset   Colon cancer Father    Lung cancer Father    Atrial fibrillation  Mother    Autoimmune disease Sister    Healthy Daughter    Healthy Son     Social History:  reports that he has been smoking cigarettes. He has never been exposed to tobacco smoke. He has quit using smokeless tobacco. He reports that he does not currently use alcohol. He reports that he does not use drugs.   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; "it makes me bleed" and caused abdominal pain -Pt can take in small  amounts(??)    Ibuprofen Other (See Comments)    CKD   Nsaids Other (See Comments)    Avoids due to kidney issues   Avelox [Moxifloxacin Hcl In Nacl] Nausea And Vomiting and Other (See Comments)    "made me really sick"   Colchicine Diarrhea   Gabapentin Other (See Comments)    Affected balance- no longer takes this   Latex Other (See Comments)    Irritates the skin   Penicillins Other (See Comments)    Severe GI upset- occurred in childhood    Medications Prior to Admission  Medication Sig Dispense Refill   acetaminophen (TYLENOL) 500 MG tablet Take 500-1,000 mg by mouth every 6 (six) hours as needed (for pain).     clonazePAM (KLONOPIN) 0.5 MG tablet Take 0.5 mg by mouth daily as needed for anxiety.     clopidogrel (PLAVIX) 75 MG tablet Take 1 tablet (75 mg total) by mouth daily. 30 tablet 11   empagliflozin (JARDIANCE) 10 MG TABS tablet Take 1 tablet (10 mg total) by mouth daily. 30 tablet 1   EXCEDRIN EXTRA STRENGTH 250-250-65 MG tablet Take 1 tablet by mouth every 6 (six) hours as needed for headache or migraine.     Febuxostat 80 MG TABS Take 80 mg by mouth every evening.     fenofibrate micronized (LOFIBRA) 200 MG capsule Take 200 mg by mouth daily.     gabapentin (NEURONTIN) 300 MG capsule Take 300 mg by mouth 3 (three) times daily.     HYDROcodone-acetaminophen (NORCO) 10-325 MG tablet Take 1 tablet by mouth in the morning and at bedtime.     levothyroxine (SYNTHROID, LEVOTHROID) 200 MCG tablet Take 200 mcg by mouth at bedtime.     metFORMIN (GLUCOPHAGE-XR) 500 MG 24 hr tablet Take 500 mg by mouth 2 (two) times daily.     metoprolol succinate (TOPROL-XL) 50 MG 24 hr tablet Take 1 tablet (50 mg total) by mouth daily. (Patient taking differently: Take 50 mg by mouth at bedtime.) 90 tablet 3   NEO-SYNEPHRINE COLD/ALLRG MILD 0.25 % nasal spray Place 1 spray into both nostrils every 6 (six) hours as needed for congestion.     nicotine polacrilex (NICORETTE) 4 MG gum Take 4 mg by  mouth as needed for smoking cessation.     pantoprazole (PROTONIX) 40 MG tablet Take 40 mg by mouth at bedtime as needed (for heartburn).     pravastatin (PRAVACHOL) 80 MG tablet Take 80 mg by mouth daily.     pseudoephedrine (SUDAFED) 120 MG 12 hr tablet Take 120 mg by mouth every 12 (twelve) hours as needed for congestion.     sacubitril-valsartan (ENTRESTO) 24-26 MG Take 1 tablet by mouth 2 (two) times daily. 60 tablet 1   spironolactone (ALDACTONE) 25 MG tablet Take 0.5 tablets (12.5 mg total) by mouth daily. 15 tablet 1   tadalafil (CIALIS) 20 MG tablet Take 20 mg by mouth  daily as needed for erectile dysfunction.      temazepam (RESTORIL) 30 MG capsule Take 30 mg by mouth at bedtime.     escitalopram (LEXAPRO) 5 MG tablet Take 10 mg by mouth daily. (Patient not taking: Reported on 12/19/2023)        Home: Home Living Family/patient expects to be discharged to:: Private residence Living Arrangements: Spouse/significant other Available Help at Discharge: Family, Friend(s), Available PRN/intermittently Type of Home: House Home Access: Stairs to enter Secretary/administrator of Steps: 2 Entrance Stairs-Rails: None Home Layout: One level Bathroom Shower/Tub: Health visitor: Handicapped height Home Equipment: Standard Environmental consultant, Medical laboratory scientific officer - single point, IT sales professional, Information systems manager, Grab bars - tub/shower, Wheelchair - power Additional Comments: Pt lives with wife and kids who all work during the day. Pt's power w/c is broken. Pt uses RW around home, transport chair for community   Functional History: Prior Function Prior Level of Function : Needs assist, Driving, History of Falls (last six months) Mobility Comments: Pt states off and on he needs help with bed mobility, uses w/c, and some days he is able to walk with RW mod I. Frequent falls 25+ in last year ADLs Comments: some days needs helps.  Functional Status:  Mobility: Bed Mobility Overal bed mobility: Needs  Assistance Bed Mobility: Sit to Supine Supine to sit: HOB elevated, Contact guard, Used rails Sit to supine: Contact guard assist General bed mobility comments: CGA, modified roll towards Rt side, cues for technique, slow and effortful due to rib pain. Transfers Overall transfer level: Needs assistance Equipment used: Hemi-walker Transfers: Sit to/from Stand Sit to Stand: Contact guard assist, Min assist Bed to/from chair/wheelchair/BSC transfer type:: Step pivot Step pivot transfers: Min assist, +2 physical assistance, +2 safety/equipment General transfer comment: verbal cues for hand placement. minA from recliner for initial transfer, CGA from bed Ambulation/Gait Ambulation/Gait assistance: Min assist Gait Distance (Feet): 30 Feet (additional trial of 20') Assistive device: Hemi-walker Gait Pattern/deviations: Step-to pattern, Wide base of support, Ataxic General Gait Details: pt with slowed step-to gait, widened BOS. Verbal cues from PT to maintain hemiwalker more lateral to BOS than anterior to BOS Gait velocity: reduced Gait velocity interpretation: <1.31 ft/sec, indicative of household ambulator Pre-gait activities: pt marches with severe ataxic, decreased foot clearance, wide BOS, and eccentric control.    ADL: ADL Overall ADL's : Needs assistance/impaired Eating/Feeding: Set up, Sitting Grooming: Minimal assistance, Sitting, Oral care, Wash/dry face Grooming Details (indicate cue type and reason): assist to open small containers but pt able to manage brushing teeth seated at sink. Upper Body Bathing: Moderate assistance, Sitting Lower Body Bathing: Moderate assistance, Sitting/lateral leans Upper Body Dressing : Moderate assistance, Sitting Lower Body Dressing: Maximal assistance, Sitting/lateral leans Toilet Transfer: Minimal assistance, +2 for safety/equipment Toileting- Clothing Manipulation and Hygiene: Set up, Sitting/lateral lean Functional mobility during ADLs:  Minimal assistance, +2 for safety/equipment (hemiwalker) General ADL Comments: Pt able to mobilize to sink in room with trial of hemiwalker and close chair follow. pt reporting ataxia varies and feeling better than yesterday. good carryover of cues on hemiwalker use  Cognition: Cognition Orientation Level: Oriented X4 Cognition Arousal: Alert Behavior During Therapy: WFL for tasks assessed/performed   Blood pressure 115/86, pulse 97, temperature 98.5 F (36.9 C), resp. rate 17, height 5\' 9"  (1.753 m), weight 89 kg, SpO2 93%. Physical Exam  Results for orders placed or performed during the hospital encounter of 12/19/23 (from the past 48 hours)  Glucose, capillary     Status: Abnormal  Collection Time: 12/23/23  3:33 PM  Result Value Ref Range   Glucose-Capillary 139 (H) 70 - 99 mg/dL    Comment: Glucose reference range applies only to samples taken after fasting for at least 8 hours.  Glucose, capillary     Status: Abnormal   Collection Time: 12/23/23  7:59 PM  Result Value Ref Range   Glucose-Capillary 212 (H) 70 - 99 mg/dL    Comment: Glucose reference range applies only to samples taken after fasting for at least 8 hours.   Comment 1 Notify RN   Glucose, capillary     Status: Abnormal   Collection Time: 12/24/23  8:00 AM  Result Value Ref Range   Glucose-Capillary 120 (H) 70 - 99 mg/dL    Comment: Glucose reference range applies only to samples taken after fasting for at least 8 hours.  Glucose, capillary     Status: Abnormal   Collection Time: 12/24/23 11:58 AM  Result Value Ref Range   Glucose-Capillary 121 (H) 70 - 99 mg/dL    Comment: Glucose reference range applies only to samples taken after fasting for at least 8 hours.  Glucose, capillary     Status: Abnormal   Collection Time: 12/24/23  4:45 PM  Result Value Ref Range   Glucose-Capillary 115 (H) 70 - 99 mg/dL    Comment: Glucose reference range applies only to samples taken after fasting for at least 8 hours.   Glucose, capillary     Status: None   Collection Time: 12/24/23  9:19 PM  Result Value Ref Range   Glucose-Capillary 92 70 - 99 mg/dL    Comment: Glucose reference range applies only to samples taken after fasting for at least 8 hours.  Glucose, capillary     Status: Abnormal   Collection Time: 12/25/23  6:10 AM  Result Value Ref Range   Glucose-Capillary 126 (H) 70 - 99 mg/dL    Comment: Glucose reference range applies only to samples taken after fasting for at least 8 hours.  Glucose, capillary     Status: Abnormal   Collection Time: 12/25/23  7:44 AM  Result Value Ref Range   Glucose-Capillary 117 (H) 70 - 99 mg/dL    Comment: Glucose reference range applies only to samples taken after fasting for at least 8 hours.  Glucose, capillary     Status: Abnormal   Collection Time: 12/25/23 11:55 AM  Result Value Ref Range   Glucose-Capillary 113 (H) 70 - 99 mg/dL    Comment: Glucose reference range applies only to samples taken after fasting for at least 8 hours.  CBC     Status: Abnormal   Collection Time: 12/25/23 12:12 PM  Result Value Ref Range   WBC 9.8 4.0 - 10.5 K/uL   RBC 4.34 4.22 - 5.81 MIL/uL   Hemoglobin 12.6 (L) 13.0 - 17.0 g/dL   HCT 57.8 (L) 46.9 - 62.9 %   MCV 86.4 80.0 - 100.0 fL   MCH 29.0 26.0 - 34.0 pg   MCHC 33.6 30.0 - 36.0 g/dL   RDW 52.8 41.3 - 24.4 %   Platelets 333 150 - 400 K/uL   nRBC 0.0 0.0 - 0.2 %    Comment: Performed at [facility], [address], [place], [place] [address]  Basic metabolic panel     Status: Abnormal   Collection Time: 12/25/23 12:12 PM  Result Value Ref Range   Sodium 133 (L) 135 - 145 mmol/L   Potassium 4.3 3.5 -  5.1 mmol/L   Chloride 96 (L) 98 - 111 mmol/L   CO2 22 22 - 32 mmol/L   Glucose, Bld 115 (H) 70 - 99 mg/dL    Comment: Glucose reference range applies only to samples taken after fasting for at least 8 hours.   BUN 23 (H) 6 - 20 mg/dL   Creatinine, Ser 4.09 0.61 - 1.24 mg/dL   Calcium 9.6 8.9 - 81.1 mg/dL    GFR, Estimated >91 >47 mL/min    Comment: (NOTE) Calculated using the CKD-EPI Creatinine Equation (2021)    Anion gap 15 5 - 15    Comment: Performed at [facility], [address] [place], [place] [address]   DG Chest 1V REPEAT Same Day Result Date: 12/25/2023 CLINICAL DATA:  Dyspnea and cough. EXAM: CHEST - 1 VIEW SAME DAY COMPARISON:  12/19/2023 FINDINGS: Interval development of patchy airspace opacity in the left mid lung and left base as well as the right base, suspicious for multifocal pneumonia. No substantial pleural effusion. Cardiopericardial silhouette is at upper limits of normal for size. No acute bony abnormality. IMPRESSION: Interval development of patchy airspace opacity in the left mid lung and left base as well as the right base, suspicious for multifocal pneumonia. Electronically Signed   By: Kennith Center M.D.   On: 12/25/2023 10:05      Blood pressure 115/86, pulse 97, temperature 98.5 F (36.9 C), resp. rate 17, height 5\' 9"  (1.753 m), weight 89 kg, SpO2 93%.  Medical Problem List and Plan: 1. Functional deficits secondary to ***  -patient may *** shower  -ELOS/Goals: *** 2.  Antithrombotics: -DVT/anticoagulation:  Mechanical: Sequential compression devices, below knee Bilateral lower extremities  -antiplatelet therapy: N/A--Plavix to be held for a week per Dr. Jordan Likes --Resume on 03/04 3. Pain Management:  On IV hydromorphone and Oxycodone prn.   --will d/c hydromorphone   4. Mood/Behavior/Sleep: LCSW to follow for evaluation and support.   -antipsychotic agents: N/A  5. Neuropsych/cognition: This patient *** capable of making decisions on *** own behalf. 6. Skin/Wound Care: Routine pressure measures 7. Fluids/Electrolytes/Nutrition: Monitor I/O. Check CMET in am 8.  Left shoulder dislocation: NWB with sling X 2 weeks. Follow up with ortho in 2 weeks. 9.  Nasal Fracture: Continue Keflex thru 03/05-->follow up with ENT in a week 10.  Left chest wall  pain/SOB: Wean oxygen. Encourage pulmonary hygiene 11. Chronic HFrEF: Compensated. Daily weight and strict I/O  --ON Jardiance, Metoprolol, Pravastatin, Spironolactone, Entresto 12. Anxiety d/o: Continue Klonopin prn 13. Insomnia: On Restoril 30 mg/hs 14. Wheezing: Flutter valve qid for pulmonary hygiene. Will schedule  duo nebs qid for now.     ***  Jacquelynn Cree, PA-C 12/25/2023

## 2023-12-22 NOTE — Progress Notes (Signed)
 [facility] PROGRESS NOTE   Gloyd Happ BJY:[id number] DOB: June 16, 1966 DOA: 12/19/2023  PCP: Jarrett Soho, PA-C  Brief History: 58 y.o. male with medical history significant for anxiety, insomnia, ataxia, history of CVA, hypothyroidism, hypertension, PAD, and chronic HFrEF who presented with injuries to his nose and right shoulder after a fall at home.  Patient has had ataxia since 2018.  Uses a walker at home.  Had a mechanical fall.  Found to have small subdural hematoma, left shoulder dislocation and mildly displaced nasal bone fracture.  Consultants: Phone discussion with the ENT, orthopedics, neurosurgery.  Procedures: Suture of laceration over nose    Subjective/Interval History: Patient mentions that pain is better controlled compared to yesterday.  Still about 7 out of 10 in intensity.  Cough is better.  No new complaints offered.       Assessment/Plan:  Subdural hematoma Secondary to mechanical fall.  CT head was repeated and showed stable findings.  Neurologically patient is stable. Imaging studies were reviewed by Dr. Jordan Likes with neurosurgery.  He recommends holding Plavix for 1 week.    Nasal bone fracture with mild displacement Discussed with Dr. Jearld Fenton with ENT who recommends follow-up in 1 week.  Also recommended cephalexin which is being administered.  Laceration over nose This was sutured.  Will need suture removal in 5 days, on March 3.  He has been given Tdap.  Left shoulder dislocation/left-sided rib cage pain This was reduced in the emergency department.  Discussed with orthopedics.  Has to be nonweightbearing and in a sling for 2 weeks.  Outpatient follow-up with orthopedics in 2 weeks. Pain was poorly controlled and so was scheduled Percocet was added.  Bowel regimen.  Pain is better controlled today.  Continue to monitor.    Cough with left-sided chest pain Most likely due to injury sustained by his fall.  Chest x-ray was repeated on 2/27 and did  not show any acute findings.  Continue with incentive spirometry and pain control.    Chronic systolic CHF LVEF is 20 to 25% based on echo done in January 2025.  Appears to be compensated.  Continue with home medications.  Noted to be on Aldactone Entresto Jardiance and metoprolol.   Respiratory status is stable.  Telemetry shows sinus tachycardia at times.  Heart rate is better controlled.  Elevated heart rate most likely due to pain.  Continue to monitor on telemetry for now. Continue metoprolol, Entresto, spironolactone and Jardiance. Creatinine noted to be 1.32 today.  This appears to be close to his baseline.  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  Uses a walker at home.  Now with immobility of his left arm he will have significant difficulty at home.  Does not think he will be able to do well at home. Seen by physical therapy who recommends inpatient rehab.  Hypothyroidism Continue with levothyroxine.  TSH was 11.5 in January.  Thyroid function test were repeated and shows that TSH is 5.2 and a free T4 is 1.38.  Continue current dose of levothyroxine.  Will recommend thyroid function test be rechecked in 4 to 6 weeks.  History of stroke Holding Plavix due to SDH.  Continue statin.  Peripheral artery disease Holding Plavix.  Continue statin.   DVT Prophylaxis: SCDs only for now Code Status: Full code Family Communication: Discussed with patient Disposition Plan: CIR is being pursued     Medications: Scheduled:  cephALEXin  500 mg Oral Q6H   empagliflozin  10 mg Oral Daily  febuxostat  80 mg Oral QPM   insulin aspart  0-5 Units Subcutaneous QHS   insulin aspart  0-6 Units Subcutaneous TID WC   levothyroxine  200 mcg Oral QHS   metoprolol succinate  50 mg Oral QHS   oxyCODONE-acetaminophen  1 tablet Oral TID   polyethylene glycol  17 g Oral Daily   pravastatin  80 mg Oral Daily   sacubitril-valsartan  1 tablet Oral BID   senna-docusate  2 tablet Oral BID    sodium chloride flush  3 mL Intravenous Q12H   spironolactone  12.5 mg Oral Daily   temazepam  30 mg Oral QHS   Continuous: ZOX:[id number] **OR** acetaminophen, benzonatate, clonazePAM, HYDROmorphone (DILAUDID) injection, metoprolol tartrate, oxyCODONE  Antibiotics: Anti-infectives (From admission, onward)    Start     Dose/Rate Route Frequency Ordered Stop   12/20/23 0100  cephALEXin (KEFLEX) capsule 500 mg        500 mg Oral Every 6 hours 12/20/23 0059 12/27/23 0159   12/19/23 1445  cephALEXin (KEFLEX) capsule 500 mg        500 mg Oral  Once 12/19/23 1444 12/19/23 1505   12/19/23 0000  cephALEXin (KEFLEX) 500 MG capsule        500 mg Oral 4 times daily 12/19/23 1449         Objective:  Vital Signs  Vitals:   12/21/23 1556 12/21/23 1917 12/22/23 0548 12/22/23 0838  BP: 101/70 95/73 (!) 124/99 110/86  Pulse: (!) 59 (!) 55 (!) 54 (!) 52  Resp: 18 18 19    Temp: 97.6 F (36.4 C) 98.1 F (36.7 C) 98.1 F (36.7 C) 98.3 F (36.8 C)  TempSrc: Oral Oral Oral   SpO2: 94% 92% 96% 94%  Weight:      Height:        Intake/Output Summary (Last 24 hours) at 12/22/2023 1051 Last data filed at 12/22/2023 1478 Gross per 24 hour  Intake 5 ml  Output 1000 ml  Net -995 ml   Filed Weights   12/19/23 0954  Weight: 89 kg    General appearance: Awake alert.  In no distress Resp: Clear to auscultation bilaterally.  Normal effort Cardio: S1-S2 is normal regular.  No S3-S4.  No rubs murmurs or bruit GI: Abdomen is soft.  Nontender nondistended.  Bowel sounds are present normal.  No masses organomegaly Extremities: No edema.  Full range of motion of lower extremities. Neurologic: Alert and oriented x3.  No focal neurological deficits.     Lab Results:  Data Reviewed: I have personally reviewed following labs and reports of the imaging studies  CBC: Recent Labs  Lab 12/19/23 1114 12/20/23 0418 12/22/23 0748  WBC 12.6* 8.9 10.0  HGB 14.4 12.0* 12.6*  HCT 42.5 34.8* 37.7*   MCV 86.7 87.7 86.3  PLT 250 234 290    Basic Metabolic Panel: Recent Labs  Lab 12/19/23 1114 12/20/23 0418 12/21/23 0710 12/22/23 0748  NA 134* 135 136 136  K 4.8 4.0 4.1 3.9  CL 104 106 98 99  CO2 23 22 24 23   GLUCOSE 192* 153* 129* 154*  BUN 16 15 16  23*  CREATININE 1.24 1.22 1.27* 1.32*  CALCIUM 9.1 8.7* 9.3 9.0    GFR: Estimated Creatinine Clearance: 67.3 mL/min (A) (by C-G formula based on SCr of 1.32 mg/dL (H)).   CBG: Recent Labs  Lab 12/21/23 1233 12/21/23 1553 12/21/23 1655 12/21/23 1915 12/22/23 0841  GLUCAP 169* 187* 176* 133* 193*    Radiology Studies:  DG CHEST PORT 1 VIEW Result Date: 12/21/2023 CLINICAL DATA:  Cough EXAM: PORTABLE CHEST 1 VIEW COMPARISON:  12/19/2023, 11/14/2023 FINDINGS: The heart size and mediastinal contours are within normal limits. No focal airspace consolidation, pleural effusion, or pneumothorax. Callus formation of the posterolateral left sixth rib related to prior fracture. IMPRESSION: No active disease. Electronically Signed   By: Duanne Guess D.O.   On: 12/21/2023 14:43       LOS: 1 day   [employer]  [facility] Pager on [email or web address]  12/22/2023, 10:51 AM

## 2023-12-22 NOTE — PMR Pre-admission (Signed)
 PMR Admission Coordinator Pre-Admission Assessment  Patient: Dustin Sampson is an 58 y.o., male MRN: [id number] DOB: [birth year] Height: 5\' 9"  (175.3 cm) Weight: 89 kg  Insurance Information HMO:     PPO:      PCP:      IPA:      80/20:      OTHER:  PRIMARY: Estate agent [employer]       Policy#: Policy #: [id number],     Subscriber:  CM Name: Dondra Spry      Phone#: [phone]     Fax#: [phone] Pre-Cert#: [id number] auth for CIR from Dondra Spry with updates due to fax listed above on 3/10      Employer:  Benefits:  Phone #: [phone]     Name:  Eff. Date: 10/25/23     Deduct: $1500 (met)      Out of Pocket Max: $5900 (met)      Life Max: n/a CIR: $337 copay, then 70% coinsurance      SNF: 70% Outpatient:      Co-Pay: $72/visit Home Health: 70%      Co-Pay: 30% DME: 70%     Co-Pay: 30% Providers:  SECONDARY:       Policy#:      Phone#:   Artist:       Phone#:   The Engineer, materials Information Summary" for patients in Inpatient Rehabilitation Facilities with attached "Privacy Act Statement-Health Care Records" was provided and verbally reviewed with: Patient and Family  Emergency Contact Information Contact Information     Name Relation Home Work Mobile   Captain,KIM Spouse [phone]        Other Contacts   None on File     Current Medical History  Patient Admitting Diagnosis: SDH   History of Present Illness: Pt is a 58 y/o male with PMH of ataxia, anxiety, insomnia, CVA, hypothyroidism, HTN, PAD, HFrEF, and frequent falls who presented to [facility] on 12/19/23 following a fall at home.  He is on plavix and with obvious facial injury.  Imaging revealed open nasal bone fracture, L anterior shoulder dislocation, and SDH.  Neurosurgery consulted and recommended repeat imaging.  ED labs notable for WBC 12.6, Na 134, and glucose 192.  Shoulder reduced in the ED and pt to be NWB with sling.  Follow up CT was stable and neurosurgery recommended holding plavix.      Patient's  medical record from [facility] has been reviewed by the rehabilitation admission coordinator and physician.  Past Medical History  Past Medical History:  Diagnosis Date   Anxiety    Arthritis    Diabetes mellitus without complication (HCC)    Gout    Hyperlipemia    Hypertension    Hypothyroidism    Insomnia    Pneumonia    Stroke [facility])    Thyroid disease     Has the patient had major surgery during 100 days prior to admission? No  Family History   family history includes Atrial fibrillation in his mother; Autoimmune disease in his sister; Colon cancer in his father; Healthy in his daughter and son; Lung cancer in his father.  Current Medications  Current Facility-Administered Medications:    acetaminophen (TYLENOL) tablet 650 mg, 650 mg, Oral, Q6H PRN, 650 mg at 12/20/23 0746 **OR** acetaminophen (TYLENOL) suppository 650 mg, 650 mg, Rectal, Q6H PRN, Opyd, Lavone Neri, MD   benzonatate (TESSALON) capsule 200 mg, 200 mg, Oral, TID PRN, Dolly Rias, MD, 200 mg at 12/21/23 1707   cephALEXin (  KEFLEX) capsule 500 mg, 500 mg, Oral, Q6H, Osvaldo Shipper, MD, 500 mg at 12/22/23 0844   clonazePAM (KLONOPIN) tablet 0.5 mg, 0.5 mg, Oral, Daily PRN, Opyd, Lavone Neri, MD, 0.5 mg at 12/21/23 1706   empagliflozin (JARDIANCE) tablet 10 mg, 10 mg, Oral, Daily, Opyd, Lavone Neri, MD, 10 mg at 12/22/23 0851   febuxostat (ULORIC) tablet 80 mg, 80 mg, Oral, QPM, Opyd, Lavone Neri, MD, 80 mg at 12/21/23 1706   HYDROmorphone (DILAUDID) injection 0.5 mg, 0.5 mg, Intravenous, Q4H PRN, Opyd, Lavone Neri, MD, 0.5 mg at 12/22/23 0202   insulin aspart (novoLOG) injection 0-5 Units, 0-5 Units, Subcutaneous, QHS, Opyd, Timothy S, MD   insulin aspart (novoLOG) injection 0-6 Units, 0-6 Units, Subcutaneous, TID WC, Opyd, Lavone Neri, MD, 1 Units at 12/22/23 1308   levothyroxine (SYNTHROID) tablet 200 mcg, 200 mcg, Oral, QHS, Opyd, Timothy S, MD, 200 mcg at 12/21/23 2157   metoprolol succinate (TOPROL-XL) 24 hr tablet  50 mg, 50 mg, Oral, QHS, Opyd, Timothy S, MD, 50 mg at 12/21/23 2158   metoprolol tartrate (LOPRESSOR) injection 2.5 mg, 2.5 mg, Intravenous, Q6H PRN, Osvaldo Shipper, MD, 2.5 mg at 12/20/23 1817   oxyCODONE (Oxy IR/ROXICODONE) immediate release tablet 5 mg, 5 mg, Oral, Q4H PRN, Opyd, Lavone Neri, MD, 5 mg at 12/22/23 6578   oxyCODONE-acetaminophen (PERCOCET) 7.5-325 MG per tablet 1 tablet, 1 tablet, Oral, TID, Osvaldo Shipper, MD, 1 tablet at 12/22/23 0850   polyethylene glycol (MIRALAX / GLYCOLAX) packet 17 g, 17 g, Oral, Daily, Osvaldo Shipper, MD, 17 g at 12/22/23 4696   pravastatin (PRAVACHOL) tablet 80 mg, 80 mg, Oral, Daily, Opyd, Lavone Neri, MD, 80 mg at 12/22/23 0850   sacubitril-valsartan (ENTRESTO) 24-26 mg per tablet, 1 tablet, Oral, BID, Opyd, Lavone Neri, MD, 1 tablet at 12/22/23 2952   senna-docusate (Senokot-S) tablet 2 tablet, 2 tablet, Oral, BID, Osvaldo Shipper, MD, 2 tablet at 12/22/23 8413   sodium chloride flush (NS) 0.9 % injection 3 mL, 3 mL, Intravenous, Q12H, Opyd, Timothy S, MD, 3 mL at 12/22/23 2440   spironolactone (ALDACTONE) tablet 12.5 mg, 12.5 mg, Oral, Daily, Opyd, Timothy S, MD, 12.5 mg at 12/22/23 0851   temazepam (RESTORIL) capsule 30 mg, 30 mg, Oral, QHS, Opyd, Lavone Neri, MD, 30 mg at 12/21/23 2157  Patients Current Diet:  Diet Order             Diet Heart Room service appropriate? Yes; Fluid consistency: Thin  Diet effective now                   Precautions / Restrictions Precautions Precautions: Fall Precaution/Restrictions Comments: ataxia Restrictions Weight Bearing Restrictions Per Provider Order: Yes LUE Weight Bearing Per Provider Order: Non weight bearing Other Position/Activity Restrictions: sling for 2 weeks   Has the patient had 2 or more falls or a fall with injury in the past year? Yes  Prior Activity Level Household: PMH of ataxia, has good days and bad days, mostly able to be mod I at home with DME, does not drive  Prior  Functional Level Self Care: Did the patient need help bathing, dressing, using the toilet or eating? Needed some help  Indoor Mobility: Did the patient need assistance with walking from room to room (with or without device)? Independent  Stairs: Did the patient need assistance with internal or external stairs (with or without device)? Needed some help  Functional Cognition: Did the patient need help planning regular tasks such as shopping or remembering to  take medications? Needed some help  Patient Information Are you of Hispanic, Latino/a,or Spanish origin?: A. No, not of Hispanic, Latino/a, or Spanish origin What is your race?: A. White Do you need or want an interpreter to communicate with a doctor or health care staff?: 0. No  Patient's Response To:  Health Literacy and Transportation Is the patient able to respond to health literacy and transportation needs?: Yes Health Literacy - How often do you need to have someone help you when you read instructions, pamphlets, or other written material from your doctor or pharmacy?: Never In the past 12 months, has lack of transportation kept you from medical appointments or from getting medications?: No In the past 12 months, has lack of transportation kept you from meetings, work, or from getting things needed for daily living?: No  Journalist, newspaper / Equipment Home Equipment: Standard Environmental consultant, Medical laboratory scientific officer - single point, IT sales professional, Information systems manager, Grab bars - tub/shower, Wheelchair - power  Prior Device Use: Indicate devices/aids used by the patient prior to current illness, exacerbation or injury?  Walker or [employer]  Current Functional Level Cognition  Orientation Level: Oriented X4    Extremity Assessment (includes Sensation/Coordination)  Upper Extremity Assessment: LUE deficits/detail, Right hand dominant LUE Deficits / Details: L shoulder reduced, dislocated from fall. NWB. encouraged gentle AROM of hand, wrist and elbow as tolerated  to decrease stiffness. educated on positioning and assisted with sling adjustment LUE: Unable to fully assess due to pain LUE Sensation: WNL LUE Coordination: decreased gross motor, decreased fine motor  Lower Extremity Assessment: Defer to PT evaluation RLE Deficits / Details: Grossly MMT 5/5 RLE Coordination: decreased gross motor LLE Deficits / Details: Gross MMT 4/5 LLE Coordination: decreased gross motor    ADLs  Overall ADL's : Needs assistance/impaired Eating/Feeding: Set up, Sitting Grooming: Minimal assistance, Sitting, Oral care, Wash/dry face Grooming Details (indicate cue type and reason): assist to open small containers but pt able to manage brushing teeth seated at sink. Upper Body Bathing: Moderate assistance, Sitting Lower Body Bathing: Moderate assistance, Sitting/lateral leans Upper Body Dressing : Moderate assistance, Sitting Lower Body Dressing: Maximal assistance, Sitting/lateral leans Toilet Transfer: Minimal assistance, +2 for safety/equipment Toileting- Clothing Manipulation and Hygiene: Set up, Sitting/lateral lean Functional mobility during ADLs: Minimal assistance, +2 for safety/equipment (hemiwalker) General ADL Comments: Pt able to mobilize to sink in room with trial of hemiwalker and close chair follow. pt reporting ataxia varies and feeling better than yesterday. good carryover of cues on hemiwalker use    Mobility  Overal bed mobility: Needs Assistance Bed Mobility: Supine to Sit Supine to sit: Min assist, HOB elevated Sit to supine: Contact guard assist, HOB elevated General bed mobility comments: handheld assist to lift trunk    Transfers  Overall transfer level: Needs assistance Equipment used: Hemi-walker Transfers: Sit to/from Stand, Bed to chair/wheelchair/BSC Sit to Stand: Min assist, +2 safety/equipment Bed to/from chair/wheelchair/BSC transfer type:: Step pivot Step pivot transfers: Min assist, +2 physical assistance, +2  safety/equipment General transfer comment: Cues for hemiwalker use to stand and pivot to recliner, Min A x 2 for safety and intermittent +2 physical assist to correct balance    Ambulation / Gait / Stairs / Wheelchair Mobility  Ambulation/Gait Ambulation/Gait assistance: Min assist, +2 physical assistance, Mod assist Gait Distance (Feet): 2 Feet Assistive device: Straight cane Gait Pattern/deviations: Step-to pattern, Decreased stride length, Ataxic, Staggering left, Staggering right, Trunk flexed, Wide base of support General Gait Details: Pt walked with greatly increased ataxic pattern, wide BOS,  and had difficulty controlling SPC. Required min A cueing for increasing weight shifts and safety. mod A from falling posteriorly. Gait velocity: reduced Gait velocity interpretation: <1.31 ft/sec, indicative of household ambulator Pre-gait activities: pt marches with severe ataxic, decreased foot clearance, wide BOS, and eccentric control.    Posture / Balance Dynamic Sitting Balance Sitting balance - Comments: pt sits EOB with no UE support  . No LOB Balance Overall balance assessment: Needs assistance Sitting-balance support: No upper extremity supported, Single extremity supported, Feet supported Sitting balance-Leahy Scale: Fair Sitting balance - Comments: pt sits EOB with no UE support  . No LOB Postural control: Posterior lean Standing balance support: During functional activity, Reliant on assistive device for balance, No upper extremity supported, Single extremity supported Standing balance-Leahy Scale: Poor Standing balance comment: freuqent LOB, x2 for safety, min-mod A to maintain balance    Special needs/care consideration N/a    Previous Home Environment (from acute therapy documentation) Living Arrangements: Spouse/significant other Available Help at Discharge: Family, Friend(s), Available PRN/intermittently Type of Home: House Home Layout: One level Home Access: Stairs to  enter Entrance Stairs-Rails: None Entrance Stairs-Number of Steps: 2 Bathroom Shower/Tub: Health visitor: Handicapped height Home Care Services: Yes Type of Home Care Services: Home PT, Home OT Home Care Agency (if known): [facility] Additional Comments: Pt lives with wife and kids who all work during the day. Pt's power w/c is broken. Pt uses RW around home, transport chair for community  Discharge Living Setting Plans for Discharge Living Setting: Patient's home, Lives with (comment) (spouse, 2 adult children, and MIL) Type of Home at Discharge: House Discharge Home Layout: One level Discharge Home Access: Stairs to enter Entrance Stairs-Rails: None Entrance Stairs-Number of Steps: 2 Discharge Bathroom Shower/Tub: Walk-in shower Discharge Bathroom Toilet: Handicapped height Discharge Bathroom Accessibility: Yes How Accessible: Accessible via walker Does the patient have any problems obtaining your medications?: No  Social/Family/Support Systems Patient Roles: Spouse, Parent Anticipated Caregiver: spouse will be primary contact: Westley Gambles Anticipated Caregiver's Contact Information: [phone] Ability/Limitations of Caregiver: Selena Batten works, will have coverage from 2 adult children and friends Caregiver Availability: 24/7 Discharge Plan Discussed with Primary Caregiver: Yes Is Caregiver In Agreement with Plan?: Yes Does Caregiver/Family have Issues with Lodging/Transportation while Pt is in Rehab?: No  Goals Patient/Family Goal for Rehab: PT/OT supervision, SLP mod I Expected length of stay: 14-18 days Additional Information: Discharge plan: home with family to provide 24/7 supervision Pt/Family Agrees to Admission and willing to participate: Yes Program Orientation Provided & Reviewed with Pt/Caregiver Including Roles  & Responsibilities: Yes  Decrease burden of Care through IP rehab admission: n/a  Possible need for SNF placement upon discharge:   Not anticipated.  Plan home with 24/7 assist from multiple family members.    Patient Condition: I have reviewed medical records from [facility], spoken with CM, and patient, spouse, and daughter. I met with patient at the bedside for inpatient rehabilitation assessment.  Patient will benefit from ongoing PT, OT, and SLP, can actively participate in 3 hours of therapy a day 5 days of the week, and can make measurable gains during the admission.  Patient will also benefit from the coordinated team approach during an Inpatient Acute Rehabilitation admission.  The patient will receive intensive therapy as well as Rehabilitation physician, nursing, social worker, and care management interventions.  Due to safety, skin/wound care, disease management, medication administration, pain management, and patient education the patient requires 24 hour a day rehabilitation nursing.  The patient is currently min assist with mobility and basic ADLs.  Discharge setting and therapy post discharge at home with home health is anticipated.  Patient has agreed to participate in the Acute Inpatient Rehabilitation Program and will admit today.  Preadmission Screen Completed By:  Stephania Fragmin, PT, DPT 12/22/2023 11:57 AM ______________________________________________________________________   Discussed status with Dr. Wynn Banker on 12/25/23  at 1:46 PM  and received approval for admission today.  Admission Coordinator:  Stephania Fragmin, PT, time 1:46 PM Dorna Bloom 12/25/23    Assessment/Plan: Diagnosis: TBI Does the need for close, 24 hr/day Medical supervision in concert with the patient's rehab needs make it unreasonable for this patient to be served in a less intensive setting? 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 Yes Does the patient require coordinated care of a physician, rehab nurse, PT, OT, and SLP to address physical and functional deficits in the context of the above medical diagnosis(es)? Yes Addressing deficits in the following areas: balance, endurance, locomotion, strength, transferring, bowel/bladder control, bathing, dressing, toileting, cognition, and psychosocial support Can the patient actively participate in an intensive therapy program of at least 3 hrs of therapy 5 days a week? Yes The potential for patient to make measurable gains while on inpatient rehab is good Anticipated functional outcomes upon discharge from inpatient rehab: supervision PT, supervision OT, supervision SLP Estimated rehab length of stay to reach the above functional goals is: 14-18d Anticipated discharge destination: Home 10. Overall Rehab/Functional Prognosis: good   MD Signature: Erick Colace M.D. [facility] Fellow Am Acad of Phys Med and Rehab Diplomate Am Board of Electrodiagnostic Med Fellow Am Board of Interventional Pain

## 2023-12-22 NOTE — Progress Notes (Signed)
SCD placed per order. 

## 2023-12-22 NOTE — Plan of Care (Signed)

## 2023-12-22 NOTE — Progress Notes (Signed)
 Inpatient Rehab Admissions Coordinator:   Insurance auth pending.  Will follow.  Estill Dooms, PT, DPT Admissions Coordinator [phone] 12/22/23  11:27 AM

## 2023-12-22 NOTE — Progress Notes (Signed)
 Physical Therapy Treatment Patient Details Name: Dustin Sampson MRN: [id number] DOB: 09-Sep-1966 Today's Date: 12/22/2023   History of Present Illness Pt is a 58 y.o. male presenting 12/19/23 s/p a fall onto his nose/face and right shoulder dislocation. Imaging: Depressed nasal bone fractures and subdermal hematoma. Of note, pt's shoulder was reducted 12/19/23. PMHx anxiety, insomnia, ataxia, history of CVA, hypothyroidism, hypertension, PAD, frequent falls, and chronic HFrEF    PT Comments  Very pleasant, eager to work with therapy. Required min assist for bed mobility, modified log roll technique to minimize rib pain which is his primary complaint today. Discussed bracing with pillow, positioning, and IS use for pulmonary hygiene and symptom management. Utilized hemi-walker. Wide BOS, ataxic, staggering frequently, needs min assist at all times for balance, max cues for hemi-walker placement and appropriate use for adequate support. Pt nervous, only being able to use one UE for support, as he is used to and more comfortably with BIL support on RW. Overall, tolerated and did very well during session. SpO2 93% on room air, 97% on 2L supplemental O2. Patient will benefit from intensive inpatient follow-up therapy, >3 hours/day.    If plan is discharge home, recommend the following: Assistance with cooking/housework;Assist for transportation;Help with stairs or ramp for entrance;A lot of help with walking and/or transfers;A lot of help with bathing/dressing/bathroom   Can travel by private vehicle        Equipment Recommendations  Other (comment) (TBD next venue; prior PT suggested BSC/3in1)    Recommendations for Other Services Rehab consult;OT consult     Precautions / Restrictions Precautions Precautions: Fall Shoulder Interventions: Shoulder sling/immobilizer Recall of Precautions/Restrictions: Intact Precaution/Restrictions Comments: ataxia Required Braces or Orthoses:  Sling Restrictions Weight Bearing Restrictions Per Provider Order: Yes LUE Weight Bearing Per Provider Order: Non weight bearing Other Position/Activity Restrictions: sling for 2 weeks     Mobility  Bed Mobility Overal bed mobility: Needs Assistance Bed Mobility: Supine to Sit     Supine to sit: HOB elevated, Contact guard, Used rails     General bed mobility comments: CGA, modified roll towards Rt side, cues for technique, slow and effortful due to rib pain.    Transfers Overall transfer level: Needs assistance Equipment used: Hemi-walker Transfers: Sit to/from Stand Sit to Stand: Min assist, From elevated surface           General transfer comment: Min assist for balance while rising from bed with cues for technique. Leaning posteriorly, wide BOS. Hemi-walker for support. Max cues for approach to chair and alignment prior to sitting.    Ambulation/Gait Ambulation/Gait assistance: Min assist Gait Distance (Feet): 28 Feet Assistive device: Hemi-walker Gait Pattern/deviations: Step-to pattern, Decreased stride length, Ataxic, Staggering left, Staggering right, Trunk flexed, Wide base of support Gait velocity: reduced Gait velocity interpretation: <1.31 ft/sec, indicative of household ambulator   General Gait Details: Required min assist for balance, cues for hemi-walker placement and use. Wide BOS, ataxic with very shortended stride, staggering.   Stairs             Wheelchair Mobility     Tilt Bed    Modified Rankin (Stroke Patients Only)       Balance Overall balance assessment: Needs assistance Sitting-balance support: Feet supported, No upper extremity supported Sitting balance-Leahy Scale: Fair Sitting balance - Comments: EOB safely but reports there are times where he loses trunk control.   Standing balance support: During functional activity, Reliant on assistive device for balance, Single extremity supported Standing balance-Leahy Scale:  Poor  Standing balance comment: Single UE support statically with hemi-walker, CGA.                            Communication Communication Communication: No apparent difficulties  Cognition Arousal: Alert Behavior During Therapy: WFL for tasks assessed/performed   PT - Cognitive impairments: No apparent impairments                         Following commands: Intact      Cueing Cueing Techniques: Verbal cues  Exercises General Exercises - Lower Extremity Ankle Circles/Pumps: AROM, Both, 5 reps, Seated Long Arc Quad: Strengthening, Both, 5 reps, Seated    General Comments General comments (skin integrity, edema, etc.): SpO2 97% on 2L at rest, ambulating on RA 93%.      Pertinent Vitals/Pain Pain Assessment Pain Assessment: Faces Faces Pain Scale: Hurts even more Pain Location: L shoulder, L ribs when coughing Pain Descriptors / Indicators: Grimacing, Guarding, Constant, Aching Pain Intervention(s): Monitored during session, Repositioned    Home Living                          Prior Function            PT Goals (current goals can now be found in the care plan section) Acute Rehab PT Goals Patient Stated Goal: get better PT Goal Formulation: With patient Time For Goal Achievement: 01/03/24 Potential to Achieve Goals: Good Progress towards PT goals: Progressing toward goals    Frequency    Min 1X/week      PT Plan      Co-evaluation              AM-PAC PT "6 Clicks" Mobility   Outcome Measure  Help needed turning from your back to your side while in a flat bed without using bedrails?: A Little Help needed moving from lying on your back to sitting on the side of a flat bed without using bedrails?: A Little Help needed moving to and from a bed to a chair (including a wheelchair)?: A Little Help needed standing up from a chair using your arms (e.g., wheelchair or bedside chair)?: A Little Help needed to walk in hospital  room?: A Lot Help needed climbing 3-5 steps with a railing? : Total 6 Click Score: 15    End of Session Equipment Utilized During Treatment: Gait belt Activity Tolerance: Patient tolerated treatment well Patient left: with call bell/phone within reach;in chair;with chair alarm set Nurse Communication: Mobility status PT Visit Diagnosis: Unsteadiness on feet (R26.81);Other abnormalities of gait and mobility (R26.89);Repeated falls (R29.6);Muscle weakness (generalized) (M62.81);History of falling (Z91.81);Ataxic gait (R26.0);Difficulty in walking, not elsewhere classified (R26.2);Other symptoms and signs involving the nervous system (R29.898);Pain Pain - part of body:  (Ribs, lt shoulder)     Time: 1308-6578 PT Time Calculation (min) (ACUTE ONLY): 38 min  Charges:    $Gait Training: 8-22 mins $Therapeutic Activity: 23-37 mins PT General Charges $$ ACUTE PT VISIT: 1 Visit                     Kathlyn Sacramento, PT, DPT [facility]  Rehabilitation Services Physical Therapist Office: [phone] Website: [email or web address]    Berton Mount 12/22/2023, 1:07 PM

## 2023-12-23 DIAGNOSIS — S065XAA Traumatic subdural hemorrhage with loss of consciousness status unknown, initial encounter: Secondary | ICD-10-CM | POA: Diagnosis not present

## 2023-12-23 DIAGNOSIS — S43015A Anterior dislocation of left humerus, initial encounter: Secondary | ICD-10-CM | POA: Diagnosis not present

## 2023-12-23 DIAGNOSIS — S20212A Contusion of left front wall of thorax, initial encounter: Secondary | ICD-10-CM | POA: Diagnosis not present

## 2023-12-23 LAB — BASIC METABOLIC PANEL
Anion gap: 13 (ref 5–15)
BUN: 27 mg/dL — ABNORMAL HIGH (ref 6–20)
CO2: 22 mmol/L (ref 22–32)
Calcium: 8.6 mg/dL — ABNORMAL LOW (ref 8.9–10.3)
Chloride: 99 mmol/L (ref 98–111)
Creatinine, Ser: 1.32 mg/dL — ABNORMAL HIGH (ref 0.61–1.24)
GFR, Estimated: >60 mL/min (ref >60)
Glucose, Bld: 121 mg/dL — ABNORMAL HIGH (ref 70–99)
Potassium: 3.9 mmol/L (ref 3.5–5.1)
Sodium: 134 mmol/L — ABNORMAL LOW (ref 135–145)

## 2023-12-23 LAB — GLUCOSE, CAPILLARY
Glucose-Capillary: 118 mg/dL — ABNORMAL HIGH (ref 70–99)
Glucose-Capillary: 139 mg/dL — ABNORMAL HIGH (ref 70–99)
Glucose-Capillary: 139 mg/dL — ABNORMAL HIGH (ref 70–99)
Glucose-Capillary: 212 mg/dL — ABNORMAL HIGH (ref 70–99)

## 2023-12-23 MED ORDER — OXYCODONE-ACETAMINOPHEN 7.5-325 MG PO TABS
1.0000 | ORAL_TABLET | Freq: Three times a day (TID) | ORAL | Status: DC
  Administered 2023-12-23 – 2023-12-25 (×7): 1 via ORAL
  Filled 2023-12-23 (×7): qty 1

## 2023-12-23 NOTE — Progress Notes (Signed)
 Mobility Specialist: Progress Note   12/23/23 1243  Mobility  Activity Transferred from bed to chair  Level of Assistance Contact guard assist, steadying assist  Assistive Device Other (Comment) (hemiwalker)  LUE Weight Bearing Per Provider Order NWB  Activity Response Tolerated well  Mobility Referral Yes  Mobility visit 1 Mobility  Mobility Specialist Start Time (ACUTE ONLY) 0855  Mobility Specialist Stop Time (ACUTE ONLY) 0902  Mobility Specialist Time Calculation (min) (ACUTE ONLY) 7 min    Pt was agreeable to mobility session - received in bed. Not feeling too well today - politely declined further ambulation but agreeable to sit in the chair for a while. CG for bed mobility, minG for STS and stand pivot to chair. C/o rib pain. Left in chair with all needs met, call bell in reach. Chair alarm on.    Maurene Capes Mobility Specialist Please contact via SecureChat or Rehab office at [phone]

## 2023-12-23 NOTE — Progress Notes (Signed)
 [facility] PROGRESS NOTE   Dustin Sampson ZOX:[id number] DOB: 1966-02-16 DOA: 12/19/2023  PCP: Jarrett Soho, PA-C  Brief History: 58 y.o. male with medical history significant for anxiety, insomnia, ataxia, history of CVA, hypothyroidism, hypertension, PAD, and chronic HFrEF who presented with injuries to his nose and right shoulder after a fall at home.  Patient has had ataxia since 2018.  Uses a walker at home.  Had a mechanical fall.  Found to have small subdural hematoma, left shoulder dislocation and mildly displaced nasal bone fracture.  Consultants: Phone discussion with the ENT, orthopedics, neurosurgery.  Procedures: Suture of laceration over nose    Subjective/Interval History: Pain is better controlled though still not completely resolved.  Pain is mainly when he changes body position and mainly in the left chest area.  No other complaints offered.  He had a bowel movement yesterday.   Assessment/Plan:  Subdural hematoma Secondary to mechanical fall.  CT head was repeated and showed stable findings.  Neurologically patient is stable. Imaging studies were reviewed by Dr. Jordan Likes with neurosurgery.  He recommends holding Plavix for 1 week, can be resumed on March 5.    Nasal bone fracture with mild displacement This was discussed with Dr. Jearld Fenton with ENT who recommends follow-up in 1 week.  Also recommended cephalexin which is being administered.  Laceration over nose This was sutured.  Will need suture removal in 5 days, on March 3.  He has been given Tdap.  Left shoulder dislocation/left-sided rib cage pain This was reduced in the emergency department.  Discussed with orthopedics.  Has to be nonweightbearing and in a sling for 2 weeks.  Outpatient follow-up with orthopedics in 2 weeks. Patient was placed on scheduled Percocet due to poor pain control.  Seems to be better now.  Had a bowel movement yesterday.  Continue with scheduled Percocet for now.   Cough with  left-sided chest pain Most likely due to injury sustained by his fall.  Chest x-ray was repeated on 2/27 and did not show any acute findings.  Continue with incentive spirometry and pain control.    Chronic systolic CHF LVEF is 20 to 25% based on echo done in January 2025.  Appears to be compensated.  Continue with home medications.  Noted to be on Aldactone Entresto Jardiance and metoprolol.   Respiratory status is stable.  Telemetry shows sinus tachycardia at times.  Heart rate is better controlled.  Elevated heart rate most likely due to pain.  Continue to monitor on telemetry for now. Continue metoprolol, Entresto, spironolactone and Jardiance. Creatinine has been at baseline.  Labs are pending from today.  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  Uses a walker at home.  Now with immobility of his left arm he will have significant difficulty at home.  Does not think he will be able to do well at home. Seen by physical therapy who recommends inpatient rehab.  Hypothyroidism Continue with levothyroxine.  TSH was 11.5 in January.  Thyroid function test were repeated and shows that TSH is 5.2 and a free T4 is 1.38.  Continue current dose of levothyroxine.  Will recommend thyroid function test be rechecked in 4 to 6 weeks.  History of stroke Holding Plavix due to SDH.  Continue statin.  Peripheral artery disease Holding Plavix.  Continue statin.   DVT Prophylaxis: SCDs only for now Code Status: Full code Family Communication: Discussed with patient Disposition Plan: CIR is being pursued     Medications: Scheduled:  cephALEXin  500 mg Oral Q6H   empagliflozin  10 mg Oral Daily   febuxostat  80 mg Oral QPM   feeding supplement  237 mL Oral BID BM   insulin aspart  0-5 Units Subcutaneous QHS   insulin aspart  0-6 Units Subcutaneous TID WC   levothyroxine  200 mcg Oral QHS   metoprolol succinate  50 mg Oral QHS   oxyCODONE-acetaminophen  1 tablet Oral TID   polyethylene  glycol  17 g Oral Daily   pravastatin  80 mg Oral Daily   sacubitril-valsartan  1 tablet Oral BID   senna-docusate  2 tablet Oral BID   sodium chloride flush  3 mL Intravenous Q12H   spironolactone  12.5 mg Oral Daily   temazepam  30 mg Oral QHS   Continuous: [id number] **OR** acetaminophen, benzonatate, clonazePAM, HYDROmorphone (DILAUDID) injection, metoprolol tartrate, oxyCODONE  Antibiotics: Anti-infectives (From admission, onward)    Start     Dose/Rate Route Frequency Ordered Stop   12/20/23 0100  cephALEXin (KEFLEX) capsule 500 mg        500 mg Oral Every 6 hours 12/20/23 0059 12/27/23 0159   12/19/23 1445  cephALEXin (KEFLEX) capsule 500 mg        500 mg Oral  Once 12/19/23 1444 12/19/23 1505   12/19/23 0000  cephALEXin (KEFLEX) 500 MG capsule        500 mg Oral 4 times daily 12/19/23 1449         Objective:  Vital Signs  Vitals:   12/22/23 2010 12/22/23 2133 12/23/23 0522 12/23/23 0730  BP: (!) 148/110 (!) 138/99 118/77 106/82  Pulse: (!) 50 62 82 90  Resp:   18 17  Temp:   97.8 F (36.6 C) 97.8 F (36.6 C)  TempSrc:   Oral   SpO2: 97%  96% 98%  Weight:      Height:        Intake/Output Summary (Last 24 hours) at 12/23/2023 0916 Last data filed at 12/23/2023 0600 Gross per 24 hour  Intake 540 ml  Output 1550 ml  Net -1010 ml   Filed Weights   12/19/23 0954  Weight: 89 kg    General appearance: Awake alert.  In no distress Resp: Clear to auscultation bilaterally.  Normal effort Cardio: S1-S2 is normal regular.  No S3-S4.  No rubs murmurs or bruit GI: Abdomen is soft.  Nontender nondistended.  Bowel sounds are present normal.  No masses organomegaly    Lab Results:  Data Reviewed: I have personally reviewed following labs and reports of the imaging studies  CBC: Recent Labs  Lab 12/19/23 1114 12/20/23 0418 12/22/23 0748  WBC 12.6* 8.9 10.0  HGB 14.4 12.0* 12.6*  HCT 42.5 34.8* 37.7*  MCV 86.7 87.7 86.3  PLT 250 234 290    Basic  Metabolic Panel: Recent Labs  Lab 12/19/23 1114 12/20/23 0418 12/21/23 0710 12/22/23 0748  NA 134* 135 136 136  K 4.8 4.0 4.1 3.9  CL 104 106 98 99  CO2 23 22 24 23   GLUCOSE 192* 153* 129* 154*  BUN 16 15 16  23*  CREATININE 1.24 1.22 1.27* 1.32*  CALCIUM 9.1 8.7* 9.3 9.0    GFR: Estimated Creatinine Clearance: 67.3 mL/min (A) (by C-G formula based on SCr of 1.32 mg/dL (H)).   CBG: Recent Labs  Lab 12/22/23 0841 12/22/23 1146 12/22/23 1604 12/22/23 2019 12/23/23 0728  GLUCAP 193* 173* 168* 164* 118*    Radiology Studies: DG CHEST PORT 1 VIEW Result  Date: 12/21/2023 CLINICAL DATA:  Cough EXAM: PORTABLE CHEST 1 VIEW COMPARISON:  12/19/2023, 11/14/2023 FINDINGS: The heart size and mediastinal contours are within normal limits. No focal airspace consolidation, pleural effusion, or pneumothorax. Callus formation of the posterolateral left sixth rib related to prior fracture. IMPRESSION: No active disease. Electronically Signed   By: Duanne Guess D.O.   On: 12/21/2023 14:43       LOS: 2 days   Dustin Sampson Rito Ehrlich  [facility] Pager on [email or web address]  12/23/2023, 9:16 AM

## 2023-12-23 NOTE — Progress Notes (Signed)
 Mobility Specialist: Progress Note   12/23/23 1245  Mobility  Activity Transferred from chair to bed  Level of Assistance Minimal assist, patient does 75% or more  Assistive Device Other (Comment) (HHA)  LUE Weight Bearing Per Provider Order NWB  Activity Response Tolerated well  Mobility Referral Yes  Mobility visit 1 Mobility  Mobility Specialist Start Time (ACUTE ONLY) 1120  Mobility Specialist Stop Time (ACUTE ONLY) 1128  Mobility Specialist Time Calculation (min) (ACUTE ONLY) 8 min    Pt requesting to get back to bed - received in chair. MinA for STS and stand pivot to bed with HHA. SV for bed mobility. Left in bed with all needs met, call bell in reach. Bed alarm on.   Maurene Capes Mobility Specialist Please contact via SecureChat or Rehab office at [phone]

## 2023-12-23 NOTE — Plan of Care (Signed)
  Problem: Coping: Goal: Ability to adjust to condition or change in health will improve Outcome: Progressing   Problem: Fluid Volume: Goal: Ability to maintain a balanced intake and output will improve Outcome: Progressing   Problem: Health Behavior/Discharge Planning: Goal: Ability to manage health-related needs will improve Outcome: Progressing   Problem: Metabolic: Goal: Ability to maintain appropriate glucose levels will improve Outcome: Progressing   Problem: Nutritional: Goal: Maintenance of adequate nutrition will improve Outcome: Progressing   Problem: Skin Integrity: Goal: Risk for impaired skin integrity will decrease Outcome: Progressing   Problem: Education: Goal: Knowledge of General Education information will improve Description: Including pain rating scale, medication(s)/side effects and non-pharmacologic comfort measures Outcome: Progressing   Problem: Health Behavior/Discharge Planning: Goal: Ability to manage health-related needs will improve Outcome: Progressing   Problem: Clinical Measurements: Goal: Will remain free from infection Outcome: Progressing   Problem: Activity: Goal: Risk for activity intolerance will decrease Outcome: Progressing   Problem: Nutrition: Goal: Adequate nutrition will be maintained Outcome: Progressing   Problem: Coping: Goal: Level of anxiety will decrease Outcome: Progressing   Problem: Elimination: Goal: Will not experience complications related to bowel motility Outcome: Progressing Goal: Will not experience complications related to urinary retention Outcome: Progressing   Problem: Safety: Goal: Ability to remain free from injury will improve Outcome: Progressing   Problem: Skin Integrity: Goal: Risk for impaired skin integrity will decrease Outcome: Progressing

## 2023-12-23 NOTE — Plan of Care (Signed)
  Problem: Coping: Goal: Ability to adjust to condition or change in health will improve Outcome: Progressing   Problem: Fluid Volume: Goal: Ability to maintain a balanced intake and output will improve Outcome: Progressing   Problem: Metabolic: Goal: Ability to maintain appropriate glucose levels will improve Outcome: Progressing   Problem: Skin Integrity: Goal: Risk for impaired skin integrity will decrease Outcome: Progressing

## 2023-12-23 NOTE — Plan of Care (Signed)

## 2023-12-23 NOTE — Progress Notes (Signed)
 Mobility Specialist: Progress Note   12/23/23 1540  Mobility  Activity Transferred to/from [facility]  Level of Assistance Minimal assist, patient does 75% or more  Assistive Device Other (Comment) (hemiwalker)  LUE Weight Bearing Per Provider Order NWB  Activity Response Tolerated well  Mobility Referral Yes  Mobility visit 1 Mobility  Mobility Specialist Start Time (ACUTE ONLY) 1525  Mobility Specialist Stop Time (ACUTE ONLY) 1535  Mobility Specialist Time Calculation (min) (ACUTE ONLY) 10 min    Pt requesting to return to bed - received on BSC. MinA for STS and stand pivot with hemiwalker. A little more unsteady than previous sessions. Left in bed with all needs met, call bell in reach. NT in room.   Maurene Capes Mobility Specialist Please contact via SecureChat or Rehab office at [phone]

## 2023-12-24 DIAGNOSIS — S065XAA Traumatic subdural hemorrhage with loss of consciousness status unknown, initial encounter: Secondary | ICD-10-CM | POA: Diagnosis not present

## 2023-12-24 DIAGNOSIS — S43015A Anterior dislocation of left humerus, initial encounter: Secondary | ICD-10-CM | POA: Diagnosis not present

## 2023-12-24 DIAGNOSIS — S20212A Contusion of left front wall of thorax, initial encounter: Secondary | ICD-10-CM | POA: Diagnosis not present

## 2023-12-24 LAB — GLUCOSE, CAPILLARY
Glucose-Capillary: 120 mg/dL — ABNORMAL HIGH (ref 70–99)
Glucose-Capillary: 121 mg/dL — ABNORMAL HIGH (ref 70–99)
Glucose-Capillary: 115 mg/dL — ABNORMAL HIGH (ref 70–99)
Glucose-Capillary: 92 mg/dL (ref 70–99)

## 2023-12-24 NOTE — Progress Notes (Signed)
 Mobility Specialist: Progress Note   12/24/23 1232  Mobility  Activity Ambulated with assistance in room  Level of Assistance Contact guard assist, steadying assist  Assistive Device Other (Comment) (Hemiwalker)  Distance Ambulated (ft) 40 ft  LUE Weight Bearing Per Provider Order NWB  Activity Response Tolerated well  Mobility Referral Yes  Mobility visit 1 Mobility  Mobility Specialist Start Time (ACUTE ONLY) 0935  Mobility Specialist Stop Time (ACUTE ONLY) 0955  Mobility Specialist Time Calculation (min) (ACUTE ONLY) 20 min    Pt was agreeable to mobility session - received in bed. SV for bed mobility, c/o rib pain. MinG for STS and ambulation. More steady than previous sessions this weekend. Ambulated to the door then back to the chair. Left in chair with all needs met, call bell in reach.   Maurene Capes Mobility Specialist Please contact via SecureChat or Rehab office at [phone]

## 2023-12-24 NOTE — Plan of Care (Signed)

## 2023-12-24 NOTE — Progress Notes (Signed)
 [facility] PROGRESS NOTE   [name] ZHY:[id number] DOB: Sep 25, 1966 DOA: 12/19/2023  PCP: Jarrett Soho, PA-C  Brief History: 58 y.o. male with medical history significant for anxiety, insomnia, ataxia, history of CVA, hypothyroidism, hypertension, PAD, and chronic HFrEF who presented with injuries to his nose and right shoulder after a fall at home.  Patient has had ataxia since 2018.  Uses a walker at home.  Had a mechanical fall.  Found to have small subdural hematoma, left shoulder dislocation and mildly displaced nasal bone fracture.  Consultants: Phone discussion with the ENT, orthopedics, neurosurgery.  Procedures: Suture of laceration over nose    Subjective/Interval History: Patient feels about the same.  Pain is reasonably well-controlled on oral pain medicines.     Assessment/Plan:  Subdural hematoma Secondary to mechanical fall.  CT head was repeated and showed stable findings.  Neurologically patient is stable. Imaging studies were reviewed by Dr. Jordan Likes with neurosurgery.  He recommends holding Plavix for 1 week, can be resumed on March 5.    Nasal bone fracture with mild displacement This was discussed with Dr. Jearld Fenton with ENT who recommends follow-up in 1 week.  Also recommended cephalexin which is being administered.  Laceration over nose This was sutured.  Will need suture removal in 5 days, on March 3.  He has been given Tdap.  Left shoulder dislocation/left-sided rib cage pain This was reduced in the emergency department.  Discussed with orthopedics.  Has to be nonweightbearing and in a sling for 2 weeks.  Outpatient follow-up with orthopedics in 2 weeks. Patient was placed on scheduled Percocet due to poor pain control.  Seems to be better now.  Can continue with bowel regimen. Repeat chest x-ray did not show any abnormalities.  Cough with left-sided chest pain Most likely due to injury sustained by his fall.  Chest x-ray was repeated on 2/27 and  did not show any acute findings.  Continue with incentive spirometry and pain control.    Chronic systolic CHF LVEF is 20 to 25% based on echo done in January 2025.  Appears to be compensated.  Continue with home medications.  Noted to be on Aldactone Entresto Jardiance and metoprolol.   Respiratory status is stable.  Telemetry shows sinus tachycardia at times.  Heart rate is better controlled.  Elevated heart rate most likely due to pain.  Continue to monitor on telemetry for now. Continue metoprolol, Entresto, spironolactone and Jardiance. Creatinine has been at baseline.   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  Uses a walker at home.  Now with immobility of his left arm he will have significant difficulty at home.  Does not think he will be able to do well at home. Seen by physical therapy who recommends inpatient rehab.  Hypothyroidism Continue with levothyroxine.  TSH was 11.5 in January.  Thyroid function test were repeated and shows that TSH is 5.2 and a free T4 is 1.38.  Continue current dose of levothyroxine.  Will recommend thyroid function test be rechecked in 4 to 6 weeks.  History of stroke Holding Plavix due to SDH.  Continue statin.  Peripheral artery disease Holding Plavix.  Continue statin.   DVT Prophylaxis: SCDs only for now Code Status: Full code Family Communication: Discussed with patient Disposition Plan: CIR is being pursued     Medications: Scheduled:  cephALEXin  500 mg Oral Q6H   empagliflozin  10 mg Oral Daily   febuxostat  80 mg Oral QPM   feeding supplement  237 mL Oral BID BM   insulin aspart  0-5 Units Subcutaneous QHS   insulin aspart  0-6 Units Subcutaneous TID WC   levothyroxine  200 mcg Oral QHS   metoprolol succinate  50 mg Oral QHS   oxyCODONE-acetaminophen  1 tablet Oral TID   polyethylene glycol  17 g Oral Daily   pravastatin  80 mg Oral Daily   sacubitril-valsartan  1 tablet Oral BID   senna-docusate  2 tablet Oral BID    sodium chloride flush  3 mL Intravenous Q12H   spironolactone  12.5 mg Oral Daily   temazepam  30 mg Oral QHS   Continuous: GNF:AOZHYQMVHQION **OR** acetaminophen, benzonatate, clonazePAM, HYDROmorphone (DILAUDID) injection, metoprolol tartrate, oxyCODONE  Antibiotics: Anti-infectives (From admission, onward)    Start     Dose/Rate Route Frequency Ordered Stop   12/20/23 0100  cephALEXin (KEFLEX) capsule 500 mg        500 mg Oral Every 6 hours 12/20/23 0059 12/27/23 0159   12/19/23 1445  cephALEXin (KEFLEX) capsule 500 mg        500 mg Oral  Once 12/19/23 1444 12/19/23 1505   12/19/23 0000  cephALEXin (KEFLEX) 500 MG capsule        500 mg Oral 4 times daily 12/19/23 1449         Objective:  Vital Signs  Vitals:   12/23/23 1211 12/23/23 1536 12/23/23 1956 12/24/23 0801  BP: 91/66 117/75 (!) 118/96 118/87  Pulse: 83 74 78 89  Resp:  18 17 17   Temp:  97.6 F (36.4 C) (!) 97.4 F (36.3 C) 98.1 F (36.7 C)  TempSrc:    Oral  SpO2: 100% 96% 94% 97%  Weight:      Height:        Intake/Output Summary (Last 24 hours) at 12/24/2023 0921 Last data filed at 12/24/2023 0819 Gross per 24 hour  Intake 295 ml  Output 700 ml  Net -405 ml   Filed Weights   12/19/23 0954  Weight: 89 kg    General appearance: Awake alert.  In no distress Resp: Clear to auscultation bilaterally.  Normal effort Cardio: S1-S2 is normal regular.  No S3-S4.  No rubs murmurs or bruit GI: Abdomen is soft.  Nontender nondistended.  Bowel sounds are present normal.  No masses organomegaly     Lab Results:  Data Reviewed: I have personally reviewed following labs and reports of the imaging studies  CBC: Recent Labs  Lab 12/19/23 1114 12/20/23 0418 12/22/23 0748  WBC 12.6* 8.9 10.0  HGB 14.4 12.0* 12.6*  HCT 42.5 34.8* 37.7*  MCV 86.7 87.7 86.3  PLT 250 234 290    Basic Metabolic Panel: Recent Labs  Lab 12/19/23 1114 12/20/23 0418 12/21/23 0710 12/22/23 0748 12/23/23 0734  NA 134*  135 136 136 134*  K 4.8 4.0 4.1 3.9 3.9  CL 104 106 98 99 99  CO2 23 22 24 23 22   GLUCOSE 192* 153* 129* 154* 121*  BUN 16 15 16  23* 27*  CREATININE 1.24 1.22 1.27* 1.32* 1.32*  CALCIUM 9.1 8.7* 9.3 9.0 8.6*    GFR: Estimated Creatinine Clearance: 67.3 mL/min (A) (by C-G formula based on SCr of 1.32 mg/dL (H)).   CBG: Recent Labs  Lab 12/23/23 0728 12/23/23 1135 12/23/23 1533 12/23/23 1959 12/24/23 0800  GLUCAP 118* 139* 139* 212* 120*    Radiology Studies: No results found.      LOS: 3 days   [employer]  [facility]  Pager on [email or web address]  12/24/2023, 9:21 AM

## 2023-12-25 ENCOUNTER — Other Ambulatory Visit: Payer: Self-pay

## 2023-12-25 ENCOUNTER — Inpatient Hospital Stay (HOSPITAL_COMMUNITY)

## 2023-12-25 ENCOUNTER — Inpatient Hospital Stay (HOSPITAL_COMMUNITY)
Admission: AD | Admit: 2023-12-25 | Discharge: 2024-01-10 | DRG: 945 | Disposition: A | Discharge status: Home Health | Source: Intra-hospital | Attending: Physical Medicine and Rehabilitation | Admitting: Physical Medicine and Rehabilitation

## 2023-12-25 ENCOUNTER — Encounter (HOSPITAL_COMMUNITY): Payer: Self-pay | Admitting: Physical Medicine and Rehabilitation

## 2023-12-25 DIAGNOSIS — Z79890 Hormone replacement therapy (postmenopausal): Secondary | ICD-10-CM

## 2023-12-25 DIAGNOSIS — Z886 Allergy status to analgesic agent status: Secondary | ICD-10-CM

## 2023-12-25 DIAGNOSIS — N179 Acute kidney failure, unspecified: Secondary | ICD-10-CM | POA: Diagnosis present

## 2023-12-25 DIAGNOSIS — Z95820 Peripheral vascular angioplasty status with implants and grafts: Secondary | ICD-10-CM | POA: Diagnosis not present

## 2023-12-25 DIAGNOSIS — Z880 Allergy status to penicillin: Secondary | ICD-10-CM

## 2023-12-25 DIAGNOSIS — D472 Monoclonal gammopathy: Secondary | ICD-10-CM | POA: Diagnosis present

## 2023-12-25 DIAGNOSIS — I10 Essential (primary) hypertension: Secondary | ICD-10-CM | POA: Diagnosis present

## 2023-12-25 DIAGNOSIS — E1122 Type 2 diabetes mellitus with diabetic chronic kidney disease: Secondary | ICD-10-CM | POA: Diagnosis present

## 2023-12-25 DIAGNOSIS — E039 Hypothyroidism, unspecified: Secondary | ICD-10-CM | POA: Diagnosis present

## 2023-12-25 DIAGNOSIS — S065XAA Traumatic subdural hemorrhage with loss of consciousness status unknown, initial encounter: Secondary | ICD-10-CM | POA: Diagnosis not present

## 2023-12-25 DIAGNOSIS — H532 Diplopia: Secondary | ICD-10-CM | POA: Diagnosis present

## 2023-12-25 DIAGNOSIS — Z7984 Long term (current) use of oral hypoglycemic drugs: Secondary | ICD-10-CM

## 2023-12-25 DIAGNOSIS — G119 Hereditary ataxia, unspecified: Secondary | ICD-10-CM | POA: Diagnosis not present

## 2023-12-25 DIAGNOSIS — I110 Hypertensive heart disease with heart failure: Secondary | ICD-10-CM | POA: Diagnosis present

## 2023-12-25 DIAGNOSIS — I69393 Ataxia following cerebral infarction: Principal | ICD-10-CM

## 2023-12-25 DIAGNOSIS — S022XXD Fracture of nasal bones, subsequent encounter for fracture with routine healing: Secondary | ICD-10-CM | POA: Diagnosis not present

## 2023-12-25 DIAGNOSIS — R4701 Aphasia: Secondary | ICD-10-CM | POA: Diagnosis not present

## 2023-12-25 DIAGNOSIS — R296 Repeated falls: Secondary | ICD-10-CM | POA: Diagnosis present

## 2023-12-25 DIAGNOSIS — I951 Orthostatic hypotension: Secondary | ICD-10-CM | POA: Diagnosis present

## 2023-12-25 DIAGNOSIS — Z8673 Personal history of transient ischemic attack (TIA), and cerebral infarction without residual deficits: Secondary | ICD-10-CM | POA: Diagnosis not present

## 2023-12-25 DIAGNOSIS — S069X0A Unspecified intracranial injury without loss of consciousness, initial encounter: Secondary | ICD-10-CM

## 2023-12-25 DIAGNOSIS — S43005D Unspecified dislocation of left shoulder joint, subsequent encounter: Secondary | ICD-10-CM | POA: Diagnosis not present

## 2023-12-25 DIAGNOSIS — B370 Candidal stomatitis: Secondary | ICD-10-CM | POA: Diagnosis present

## 2023-12-25 DIAGNOSIS — Z800 Family history of malignant neoplasm of digestive organs: Secondary | ICD-10-CM

## 2023-12-25 DIAGNOSIS — R42 Dizziness and giddiness: Secondary | ICD-10-CM | POA: Diagnosis not present

## 2023-12-25 DIAGNOSIS — Z79899 Other long term (current) drug therapy: Secondary | ICD-10-CM

## 2023-12-25 DIAGNOSIS — M109 Gout, unspecified: Secondary | ICD-10-CM | POA: Diagnosis present

## 2023-12-25 DIAGNOSIS — W19XXXD Unspecified fall, subsequent encounter: Secondary | ICD-10-CM | POA: Diagnosis present

## 2023-12-25 DIAGNOSIS — J189 Pneumonia, unspecified organism: Secondary | ICD-10-CM | POA: Diagnosis present

## 2023-12-25 DIAGNOSIS — R0781 Pleurodynia: Secondary | ICD-10-CM

## 2023-12-25 DIAGNOSIS — Z8249 Family history of ischemic heart disease and other diseases of the circulatory system: Secondary | ICD-10-CM

## 2023-12-25 DIAGNOSIS — Z888 Allergy status to other drugs, medicaments and biological substances status: Secondary | ICD-10-CM

## 2023-12-25 DIAGNOSIS — I255 Ischemic cardiomyopathy: Secondary | ICD-10-CM | POA: Diagnosis present

## 2023-12-25 DIAGNOSIS — E119 Type 2 diabetes mellitus without complications: Secondary | ICD-10-CM | POA: Diagnosis present

## 2023-12-25 DIAGNOSIS — E785 Hyperlipidemia, unspecified: Secondary | ICD-10-CM | POA: Diagnosis present

## 2023-12-25 DIAGNOSIS — R059 Cough, unspecified: Secondary | ICD-10-CM | POA: Diagnosis present

## 2023-12-25 DIAGNOSIS — I5022 Chronic systolic (congestive) heart failure: Secondary | ICD-10-CM | POA: Diagnosis present

## 2023-12-25 DIAGNOSIS — F17210 Nicotine dependence, cigarettes, uncomplicated: Secondary | ICD-10-CM | POA: Diagnosis present

## 2023-12-25 DIAGNOSIS — K5900 Constipation, unspecified: Secondary | ICD-10-CM | POA: Diagnosis present

## 2023-12-25 DIAGNOSIS — Z801 Family history of malignant neoplasm of trachea, bronchus and lung: Secondary | ICD-10-CM

## 2023-12-25 DIAGNOSIS — G4700 Insomnia, unspecified: Secondary | ICD-10-CM | POA: Diagnosis present

## 2023-12-25 DIAGNOSIS — S2249XA Multiple fractures of ribs, unspecified side, initial encounter for closed fracture: Secondary | ICD-10-CM | POA: Insufficient documentation

## 2023-12-25 DIAGNOSIS — Z91040 Latex allergy status: Secondary | ICD-10-CM

## 2023-12-25 DIAGNOSIS — T7840XA Allergy, unspecified, initial encounter: Secondary | ICD-10-CM | POA: Diagnosis present

## 2023-12-25 DIAGNOSIS — S065XAD Traumatic subdural hemorrhage with loss of consciousness status unknown, subsequent encounter: Principal | ICD-10-CM

## 2023-12-25 DIAGNOSIS — Z7902 Long term (current) use of antithrombotics/antiplatelets: Secondary | ICD-10-CM

## 2023-12-25 DIAGNOSIS — F419 Anxiety disorder, unspecified: Secondary | ICD-10-CM | POA: Diagnosis present

## 2023-12-25 DIAGNOSIS — Z881 Allergy status to other antibiotic agents status: Secondary | ICD-10-CM

## 2023-12-25 DIAGNOSIS — R270 Ataxia, unspecified: Secondary | ICD-10-CM

## 2023-12-25 DIAGNOSIS — Z8489 Family history of other specified conditions: Secondary | ICD-10-CM

## 2023-12-25 LAB — BASIC METABOLIC PANEL
Anion gap: 15 (ref 5–15)
BUN: 23 mg/dL — ABNORMAL HIGH (ref 6–20)
CO2: 22 mmol/L (ref 22–32)
Calcium: 9.6 mg/dL (ref 8.9–10.3)
Chloride: 96 mmol/L — ABNORMAL LOW (ref 98–111)
Creatinine, Ser: 1.12 mg/dL (ref 0.61–1.24)
GFR, Estimated: >60 mL/min (ref >60)
Glucose, Bld: 115 mg/dL — ABNORMAL HIGH (ref 70–99)
Potassium: 4.3 mmol/L (ref 3.5–5.1)
Sodium: 133 mmol/L — ABNORMAL LOW (ref 135–145)

## 2023-12-25 LAB — CBC
HCT: 37.5 % — ABNORMAL LOW (ref 39.0–52.0)
Hemoglobin: 12.6 g/dL — ABNORMAL LOW (ref 13.0–17.0)
MCH: 29 pg (ref 26.0–34.0)
MCHC: 33.6 g/dL (ref 30.0–36.0)
MCV: 86.4 fL (ref 80.0–100.0)
Platelets: 333 10*3/uL (ref 150–400)
RBC: 4.34 MIL/uL (ref 4.22–5.81)
RDW: 13.7 % (ref 11.5–15.5)
WBC: 9.8 10*3/uL (ref 4.0–10.5)
nRBC: 0 % (ref 0.0–0.2)

## 2023-12-25 LAB — GLUCOSE, CAPILLARY
Glucose-Capillary: 126 mg/dL — ABNORMAL HIGH (ref 70–99)
Glucose-Capillary: 117 mg/dL — ABNORMAL HIGH (ref 70–99)
Glucose-Capillary: 113 mg/dL — ABNORMAL HIGH (ref 70–99)
Glucose-Capillary: 160 mg/dL — ABNORMAL HIGH (ref 70–99)
Glucose-Capillary: 131 mg/dL — ABNORMAL HIGH (ref 70–99)

## 2023-12-25 MED ORDER — METOPROLOL SUCCINATE ER 50 MG PO TB24
50.0000 mg | ORAL_TABLET | Freq: Every day | ORAL | Status: DC
  Administered 2023-12-25 – 2024-01-09 (×16): 50 mg via ORAL
  Filled 2023-12-25 (×16): qty 1

## 2023-12-25 MED ORDER — PROCHLORPERAZINE EDISYLATE 10 MG/2ML IJ SOLN: 5.0000 mg | Freq: Four times a day (QID) | INTRAMUSCULAR | Status: DC | PRN

## 2023-12-25 MED ORDER — SPIRONOLACTONE 12.5 MG HALF TABLET: 12.5000 mg | ORAL_TABLET | Freq: Every day | ORAL | Status: DC

## 2023-12-25 MED ORDER — ACETAMINOPHEN 325 MG PO TABS
325.0000 mg | ORAL_TABLET | ORAL | Status: DC | PRN
  Administered 2024-01-01 – 2024-01-03 (×2): 650 mg via ORAL
  Filled 2023-12-25 (×2): qty 2

## 2023-12-25 MED ORDER — KETOCONAZOLE 2 % EX CREA
TOPICAL_CREAM | Freq: Two times a day (BID) | CUTANEOUS | Status: DC
  Filled 2023-12-25: qty 15

## 2023-12-25 MED ORDER — DOXYCYCLINE HYCLATE 100 MG PO TABS
100.0000 mg | ORAL_TABLET | Freq: Two times a day (BID) | ORAL | Status: DC
  Administered 2023-12-25: 100 mg via ORAL
  Filled 2023-12-25: qty 1

## 2023-12-25 MED ORDER — FEBUXOSTAT 40 MG PO TABS
80.0000 mg | ORAL_TABLET | Freq: Every evening | ORAL | Status: DC
  Administered 2023-12-25 – 2024-01-09 (×16): 80 mg via ORAL
  Filled 2023-12-25 (×16): qty 2

## 2023-12-25 MED ORDER — NICOTINE POLACRILEX 2 MG MT GUM: 2.0000 mg | CHEWING_GUM | OROMUCOSAL | Status: DC | PRN

## 2023-12-25 MED ORDER — SELENIUM SULFIDE 1 % EX LOTN
TOPICAL_LOTION | Freq: Every day | CUTANEOUS | Status: DC
  Filled 2023-12-25 (×2): qty 207

## 2023-12-25 MED ORDER — PROCHLORPERAZINE 25 MG RE SUPP: 12.5000 mg | Freq: Four times a day (QID) | RECTAL | Status: DC | PRN

## 2023-12-25 MED ORDER — INSULIN ASPART 100 UNIT/ML IJ SOLN
0.0000 [IU] | Freq: Three times a day (TID) | INTRAMUSCULAR | Status: DC
  Administered 2024-01-03 – 2024-01-05 (×2): 1 [IU] via SUBCUTANEOUS
  Administered 2024-01-06 – 2024-01-07 (×2): 2 [IU] via SUBCUTANEOUS

## 2023-12-25 MED ORDER — OXYCODONE-ACETAMINOPHEN 7.5-325 MG PO TABS
1.0000 | ORAL_TABLET | Freq: Two times a day (BID) | ORAL | Status: DC
  Administered 2023-12-26 – 2024-01-10 (×30): 1 via ORAL
  Filled 2023-12-25 (×32): qty 1

## 2023-12-25 MED ORDER — ALUM & MAG HYDROXIDE-SIMETH 200-200-20 MG/5ML PO SUSP: 30.0000 mL | ORAL | Status: DC | PRN

## 2023-12-25 MED ORDER — OXYCODONE HCL 5 MG PO TABS
5.0000 mg | ORAL_TABLET | ORAL | Status: DC | PRN
  Administered 2023-12-25 – 2024-01-03 (×7): 5 mg via ORAL
  Filled 2023-12-25 (×8): qty 1

## 2023-12-25 MED ORDER — ENSURE MAX PROTEIN PO LIQD
330.0000 mL | Freq: Three times a day (TID) | ORAL | Status: DC
  Administered 2023-12-26 – 2024-01-10 (×15): 330 mL via ORAL

## 2023-12-25 MED ORDER — EMPAGLIFLOZIN 10 MG PO TABS: 10.0000 mg | ORAL_TABLET | Freq: Every day | ORAL | Status: DC

## 2023-12-25 MED ORDER — PRAVASTATIN SODIUM 40 MG PO TABS
80.0000 mg | ORAL_TABLET | Freq: Every day | ORAL | Status: DC
  Administered 2023-12-25 – 2024-01-10 (×17): 80 mg via ORAL
  Filled 2023-12-25 (×17): qty 2

## 2023-12-25 MED ORDER — PHENYLEPHRINE HCL 1 % NA SOLN
1.0000 [drp] | Freq: Four times a day (QID) | NASAL | Status: DC | PRN
  Filled 2023-12-25: qty 15

## 2023-12-25 MED ORDER — PROCHLORPERAZINE MALEATE 5 MG PO TABS: 5.0000 mg | ORAL_TABLET | Freq: Four times a day (QID) | ORAL | Status: DC | PRN

## 2023-12-25 MED ORDER — SACUBITRIL-VALSARTAN 24-26 MG PO TABS
1.0000 | ORAL_TABLET | Freq: Two times a day (BID) | ORAL | Status: DC
  Administered 2023-12-25 – 2023-12-29 (×8): 1 via ORAL
  Filled 2023-12-25 (×9): qty 1

## 2023-12-25 MED ORDER — INSULIN ASPART 100 UNIT/ML IJ SOLN: 0.0000 [IU] | Freq: Every day | INTRAMUSCULAR | Status: DC

## 2023-12-25 MED ORDER — FUROSEMIDE 10 MG/ML IJ SOLN
20.0000 mg | Freq: Once | INTRAMUSCULAR | Status: AC
  Administered 2023-12-25: 20 mg via INTRAVENOUS
  Filled 2023-12-25: qty 2

## 2023-12-25 MED ORDER — BISACODYL 10 MG RE SUPP: 10.0000 mg | Freq: Every day | RECTAL | Status: DC | PRN

## 2023-12-25 MED ORDER — SPIRONOLACTONE 12.5 MG HALF TABLET
12.5000 mg | ORAL_TABLET | Freq: Every day | ORAL | Status: DC
  Administered 2023-12-26 – 2024-01-05 (×11): 12.5 mg via ORAL
  Filled 2023-12-25 (×11): qty 1

## 2023-12-25 MED ORDER — EMPAGLIFLOZIN 10 MG PO TABS
10.0000 mg | ORAL_TABLET | Freq: Every day | ORAL | Status: DC
  Administered 2023-12-26 – 2023-12-28 (×3): 10 mg via ORAL
  Filled 2023-12-25 (×3): qty 1

## 2023-12-25 MED ORDER — CEPHALEXIN 250 MG PO CAPS
500.0000 mg | ORAL_CAPSULE | Freq: Four times a day (QID) | ORAL | Status: AC
  Administered 2023-12-25 – 2023-12-26 (×5): 500 mg via ORAL
  Filled 2023-12-25 (×5): qty 2

## 2023-12-25 MED ORDER — IPRATROPIUM-ALBUTEROL 0.5-2.5 (3) MG/3ML IN SOLN
3.0000 mL | Freq: Four times a day (QID) | RESPIRATORY_TRACT | Status: DC
  Administered 2023-12-25 – 2023-12-26 (×4): 3 mL via RESPIRATORY_TRACT
  Filled 2023-12-25 (×3): qty 3

## 2023-12-25 MED ORDER — IPRATROPIUM-ALBUTEROL 0.5-2.5 (3) MG/3ML IN SOLN
3.0000 mL | Freq: Once | RESPIRATORY_TRACT | Status: AC
  Administered 2023-12-25: 3 mL via RESPIRATORY_TRACT
  Filled 2023-12-25: qty 3

## 2023-12-25 MED ORDER — POLYETHYLENE GLYCOL 3350 17 G PO PACK
17.0000 g | PACK | Freq: Every day | ORAL | Status: DC
  Administered 2023-12-25 – 2024-01-06 (×11): 17 g via ORAL
  Filled 2023-12-25 (×18): qty 1

## 2023-12-25 MED ORDER — BENZONATATE 100 MG PO CAPS: 200.0000 mg | ORAL_CAPSULE | Freq: Three times a day (TID) | ORAL | Status: DC | PRN

## 2023-12-25 MED ORDER — FLEET ENEMA RE ENEM: 1.0000 | ENEMA | Freq: Once | RECTAL | Status: DC | PRN

## 2023-12-25 MED ORDER — MELATONIN 3 MG PO TABS: 3.0000 mg | ORAL_TABLET | Freq: Every evening | ORAL | Status: DC | PRN

## 2023-12-25 MED ORDER — NYSTATIN 100000 UNIT/GM EX POWD
Freq: Two times a day (BID) | CUTANEOUS | Status: DC
  Filled 2023-12-25: qty 15

## 2023-12-25 MED ORDER — TEMAZEPAM 15 MG PO CAPS
30.0000 mg | ORAL_CAPSULE | Freq: Every day | ORAL | Status: DC
  Administered 2023-12-25 – 2024-01-09 (×16): 30 mg via ORAL
  Filled 2023-12-25: qty 4
  Filled 2023-12-25 (×5): qty 2
  Filled 2023-12-25: qty 4
  Filled 2023-12-25 (×4): qty 2
  Filled 2023-12-25 (×3): qty 4
  Filled 2023-12-25 (×2): qty 2
  Filled 2023-12-25 (×2): qty 4

## 2023-12-25 MED ORDER — SENNOSIDES-DOCUSATE SODIUM 8.6-50 MG PO TABS
2.0000 | ORAL_TABLET | Freq: Two times a day (BID) | ORAL | Status: DC
  Administered 2023-12-25 – 2024-01-09 (×31): 2 via ORAL
  Filled 2023-12-25 (×32): qty 2

## 2023-12-25 MED ORDER — CLONAZEPAM 0.5 MG PO TABS
0.5000 mg | ORAL_TABLET | Freq: Every day | ORAL | Status: DC | PRN
  Administered 2023-12-29 – 2024-01-10 (×11): 0.5 mg via ORAL
  Filled 2023-12-25 (×12): qty 1

## 2023-12-25 MED ORDER — DIPHENHYDRAMINE HCL 25 MG PO CAPS: 25.0000 mg | ORAL_CAPSULE | Freq: Four times a day (QID) | ORAL | Status: DC | PRN

## 2023-12-25 MED ORDER — LEVOTHYROXINE SODIUM 100 MCG PO TABS
200.0000 ug | ORAL_TABLET | Freq: Every day | ORAL | Status: DC
  Administered 2023-12-25 – 2024-01-09 (×16): 200 ug via ORAL
  Filled 2023-12-25 (×16): qty 2

## 2023-12-25 MED ORDER — DOXYCYCLINE HYCLATE 100 MG PO TABS
100.0000 mg | ORAL_TABLET | Freq: Two times a day (BID) | ORAL | Status: AC
  Administered 2023-12-25 – 2023-12-29 (×9): 100 mg via ORAL
  Filled 2023-12-25 (×9): qty 1

## 2023-12-25 MED ORDER — OXYCODONE-ACETAMINOPHEN 7.5-325 MG PO TABS
1.0000 | ORAL_TABLET | ORAL | Status: DC | PRN
  Administered 2023-12-26: 1 via ORAL
  Filled 2023-12-25: qty 1

## 2023-12-25 NOTE — Plan of Care (Signed)
  Problem: Education: Goal: Ability to describe self-care measures that may prevent or decrease complications (Diabetes Survival Skills Education) will improve Outcome: Progressing Goal: Individualized Educational Video(s) Outcome: Progressing   Problem: Coping: Goal: Ability to adjust to condition or change in health will improve Outcome: Progressing   Problem: Fluid Volume: Goal: Ability to maintain a balanced intake and output will improve Outcome: Progressing   Problem: Health Behavior/Discharge Planning: Goal: Ability to identify and utilize available resources and services will improve Outcome: Progressing Goal: Ability to manage health-related needs will improve Outcome: Progressing   Problem: Metabolic: Goal: Ability to maintain appropriate glucose levels will improve Outcome: Progressing   Problem: Nutritional: Goal: Maintenance of adequate nutrition will improve Outcome: Progressing Goal: Progress toward achieving an optimal weight will improve Outcome: Progressing   Problem: Skin Integrity: Goal: Risk for impaired skin integrity will decrease Outcome: Progressing   Problem: Tissue Perfusion: Goal: Adequacy of tissue perfusion will improve Outcome: Progressing   Problem: Clinical Measurements: Goal: Ability to maintain clinical measurements within normal limits will improve Outcome: Progressing Goal: Will remain free from infection Outcome: Progressing Goal: Diagnostic test results will improve Outcome: Progressing Goal: Respiratory complications will improve Outcome: Progressing Goal: Cardiovascular complication will be avoided Outcome: Progressing   Problem: Activity: Goal: Risk for activity intolerance will decrease Outcome: Progressing   Problem: Nutrition: Goal: Adequate nutrition will be maintained Outcome: Progressing   Problem: Elimination: Goal: Will not experience complications related to bowel motility Outcome: Progressing Goal: Will  not experience complications related to urinary retention Outcome: Progressing   Problem: Pain Managment: Goal: General experience of comfort will improve and/or be controlled Outcome: Progressing   Problem: Safety: Goal: Ability to remain free from injury will improve Outcome: Progressing   Problem: Skin Integrity: Goal: Risk for impaired skin integrity will decrease Outcome: Progressing

## 2023-12-25 NOTE — Progress Notes (Signed)
 Inpatient Rehab Admissions Coordinator:   Left message for [employer] rep for prior auth status.  Case still pending on provider portal.   Estill Dooms, PT, DPT Admissions Coordinator [phone] 12/25/23  11:45 AM

## 2023-12-25 NOTE — Progress Notes (Signed)
 Physical Therapy Treatment Patient Details Name: Dustin Sampson MRN: [id number] DOB: 07-20-1966 Today's Date: 12/25/2023   History of Present Illness Pt is a 58 y.o. male presenting 12/19/23 s/p a fall onto his nose/face and right shoulder dislocation. Imaging: Depressed nasal bone fractures and subdermal hematoma. Of note, pt's shoulder was reducted 12/19/23. PMHx anxiety, insomnia, ataxia, history of CVA, hypothyroidism, hypertension, PAD, frequent falls, and chronic HFrEF    PT Comments  Pt tolerates treatment well despite reports of fatigue and feeling poorly today. Pt ambulates for multiple trials, continuing to demonstrate impaired balance and ataxic gait. Pt benefits from verbal cues for hemiwalker management, along with intermittent physical assistance to correct balance deviations. Patient will benefit from intensive inpatient follow-up therapy, >3 hours/day.    If plan is discharge home, recommend the following: Assistance with cooking/housework;Assist for transportation;Help with stairs or ramp for entrance;A lot of help with walking and/or transfers;A lot of help with bathing/dressing/bathroom   Can travel by private vehicle        Equipment Recommendations  Other (comment);BSC/3in1 (hemi-walker)    Recommendations for Other Services       Precautions / Restrictions Precautions Precautions: Fall Required Braces or Orthoses: Sling Restrictions Weight Bearing Restrictions Per Provider Order: Yes LUE Weight Bearing Per Provider Order: Non weight bearing     Mobility  Bed Mobility Overal bed mobility: Needs Assistance Bed Mobility: Sit to Supine       Sit to supine: Contact guard assist        Transfers Overall transfer level: Needs assistance Equipment used: Hemi-walker Transfers: Sit to/from Stand Sit to Stand: Contact guard assist, Min assist           General transfer comment: verbal cues for hand placement. minA from recliner for initial transfer, CGA from  bed    Ambulation/Gait Ambulation/Gait assistance: Min assist Gait Distance (Feet): 30 Feet (additional trial of 20') Assistive device: Hemi-walker Gait Pattern/deviations: Step-to pattern, Wide base of support, Ataxic Gait velocity: reduced Gait velocity interpretation: <1.31 ft/sec, indicative of household ambulator   General Gait Details: pt with slowed step-to gait, widened BOS. Verbal cues from PT to maintain hemiwalker more lateral to BOS than anterior to BOS   Stairs             Wheelchair Mobility     Tilt Bed    Modified Rankin (Stroke Patients Only)       Balance Overall balance assessment: Needs assistance Sitting-balance support: No upper extremity supported, Feet supported Sitting balance-Leahy Scale: Good     Standing balance support: Reliant on assistive device for balance, Single extremity supported Standing balance-Leahy Scale: Poor                              Communication Communication Communication: Impaired Factors Affecting Communication: Reduced clarity of speech  Cognition Arousal: Alert Behavior During Therapy: WFL for tasks assessed/performed   PT - Cognitive impairments: No apparent impairments                         Following commands: Intact      Cueing Cueing Techniques: Verbal cues  Exercises      General Comments General comments (skin integrity, edema, etc.): VSS on RA      Pertinent Vitals/Pain Pain Assessment Pain Assessment: Faces Faces Pain Scale: Hurts little more Pain Location: ribs Pain Descriptors / Indicators: Aching Pain Intervention(s): Monitored during session  Home Living                          Prior Function            PT Goals (current goals can now be found in the care plan section) Acute Rehab PT Goals Patient Stated Goal: get better Progress towards PT goals: Progressing toward goals    Frequency    Min 1X/week      PT Plan       Co-evaluation              AM-PAC PT "6 Clicks" Mobility   Outcome Measure  Help needed turning from your back to your side while in a flat bed without using bedrails?: A Little Help needed moving from lying on your back to sitting on the side of a flat bed without using bedrails?: A Little Help needed moving to and from a bed to a chair (including a wheelchair)?: A Little Help needed standing up from a chair using your arms (e.g., wheelchair or bedside chair)?: A Little Help needed to walk in hospital room?: A Little Help needed climbing 3-5 steps with a railing? : Total 6 Click Score: 16    End of Session Equipment Utilized During Treatment: Gait belt Activity Tolerance: Patient tolerated treatment well Patient left: in bed;with call bell/phone within reach;with bed alarm set Nurse Communication: Mobility status PT Visit Diagnosis: Unsteadiness on feet (R26.81);Other abnormalities of gait and mobility (R26.89);Repeated falls (R29.6);Muscle weakness (generalized) (M62.81);History of falling (Z91.81);Ataxic gait (R26.0);Difficulty in walking, not elsewhere classified (R26.2);Other symptoms and signs involving the nervous system (R29.898);Pain     Time: 1255-1318 PT Time Calculation (min) (ACUTE ONLY): 23 min  Charges:    $Gait Training: 23-37 mins PT General Charges $$ ACUTE PT VISIT: 1 Visit                     Arlyss Gandy, PT, DPT Acute Rehabilitation Office [phone]    Arlyss Gandy 12/25/2023, 2:16 PM

## 2023-12-25 NOTE — Progress Notes (Signed)
 Inpatient Rehab Admissions Coordinator:    I have insurance approval and a bed available for pt to admit to CIR today. Dr. Rito Ehrlich in agreement.  Will let pt/family and TOC team know.   Estill Dooms, PT, DPT Admissions Coordinator [phone] 12/25/23  2:52 PM

## 2023-12-25 NOTE — H&P (Signed)
 Physical Medicine and Rehabilitation Admission H&P        Chief Complaint  Patient presents with   Functional deficits due to SDH/gait d/o      HPI:  Dustin Sampson is a 58 year old  male with history of CVA with ataxia, HFrEF, insomnia, anxiety d/o, PAD, frequent falls who was admitted on 12/19/23 after tripping and falling onto and complaints of nose and right shoulder pain with epistaxis. He was not using walker when he fell and noted to have shoulder deformity at admission. Work up revealed small area of SDH along anterior falx and minimally displaced nasal Fx on left. Dr.Pool recommended repeat CT for monitoring and if stable OK for d/c as well as holding Plavix for 7 days. Nasal laceration repaired and patient started on Keflex per discussion with Dr. Jearld Fenton who recommends follow up in one week. Left shoulder dislocation reduced and to be NWB with ortho follow up in 2 weeks.    He developed SOB requiring oxygen on 02/27 with reports of left chest pain as well as cough. Has also had tachycardia felt to be pain mediated. CXR was negative. PT/OT consulted and patient noted to require min to mod assist due to wide based staggering gait as well as anxiety. Marland Kitchen CIR recommended due to functional decline.     Hx multiple falls with left rib injury anterior lower ribs , pain with deep breath   Review of Systems  Musculoskeletal:  Positive for falls.          Past Medical History:  Diagnosis Date   Anxiety     Arthritis     Diabetes mellitus without complication (HCC)     Gout     Hyperlipemia     Hypertension     Hypothyroidism     Insomnia     Pneumonia     Stroke [facility])     Thyroid disease                 Past Surgical History:  Procedure Laterality Date   ABDOMINAL AORTOGRAM W/LOWER EXTREMITY Bilateral 08/21/2020    Procedure: ABDOMINAL AORTOGRAM W/LOWER EXTREMITY;  Surgeon: Sherren Kerns, MD;  Location: [facility];  Service: Cardiovascular;  Laterality: Bilateral;    ABDOMINAL AORTOGRAM W/LOWER EXTREMITY N/A 09/04/2020    Procedure: ABDOMINAL AORTOGRAM W/LOWER EXTREMITY;  Surgeon: Sherren Kerns, MD;  Location: [facility];  Service: Cardiovascular;  Laterality: N/A;   COLONOSCOPY        x2   FEMORAL ARTERY EXPLORATION Left 09/04/2020    Procedure: FEMORAL ARTERY EXPLORATION;  Surgeon: Chuck Hint, MD;  Location: [facility] OR;  Service: Vascular;  Laterality: Left;   NASAL SINUS SURGERY       PERIPHERAL VASCULAR INTERVENTION Left 08/21/2020    Procedure: PERIPHERAL VASCULAR INTERVENTION;  Surgeon: Sherren Kerns, MD;  Location: [facility];  Service: Cardiovascular;  Laterality: Left;  sfa stent x 3   PERIPHERAL VASCULAR INTERVENTION   09/04/2020    Procedure: PERIPHERAL VASCULAR INTERVENTION;  Surgeon: Sherren Kerns, MD;  Location: [facility];  Service: Cardiovascular;;  Right SFA   RIGHT/LEFT HEART CATH AND CORONARY ANGIOGRAPHY N/A 11/16/2023    Procedure: RIGHT/LEFT HEART CATH AND CORONARY ANGIOGRAPHY;  Surgeon: Yates Decamp, MD;  Location: [facility];  Service: Cardiovascular;  Laterality: N/A;   SHOULDER ARTHROSCOPY WITH ROTATOR CUFF REPAIR AND SUBACROMIAL DECOMPRESSION Left 02/04/2021    Procedure: SHOULDER ARTHROSCOPY WITH ROTATOR CUFF REPAIR  AND SUBACROMIAL DECOMPRESSION;  Surgeon: Jones Broom, MD;  Location: [facility] ORS;  Service: Orthopedics;  Laterality: Left;   WISDOM TOOTH EXTRACTION       WRIST SURGERY        bilat               Family History  Problem Relation Age of Onset   Colon cancer Father     Lung cancer Father     Atrial fibrillation Mother     Autoimmune disease Sister     Healthy Daughter     Healthy Son            Social History:  reports that he has been smoking cigarettes. He has never been exposed to tobacco smoke. He has quit using smokeless tobacco. He reports that he does not currently use alcohol. He reports that he does not use drugs.     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; "it makes me bleed" and caused abdominal pain -Pt can take in small amounts(??)     Ibuprofen Other (See Comments)      CKD   Nsaids Other (See Comments)      Avoids due to kidney issues   Avelox [Moxifloxacin Hcl In Nacl] Nausea And Vomiting and Other (See Comments)      "made me really sick"   Colchicine Diarrhea   Gabapentin Other (See Comments)      Affected balance- no longer takes this   Latex Other (See Comments)      Irritates the skin   Penicillins Other (See Comments)      Severe GI upset- occurred in childhood              Medications Prior to Admission  Medication Sig Dispense Refill   acetaminophen (TYLENOL) 500 MG tablet Take 500-1,000 mg by mouth every 6 (six) hours as needed (for pain).       clonazePAM (KLONOPIN) 0.5 MG tablet Take 0.5 mg by mouth daily as needed for anxiety.       clopidogrel (PLAVIX) 75 MG tablet Take 1 tablet (75 mg total) by mouth daily. 30 tablet 11   empagliflozin (JARDIANCE) 10 MG TABS tablet Take 1 tablet (10 mg total) by mouth daily. 30 tablet 1   EXCEDRIN EXTRA STRENGTH 250-250-65 MG tablet Take 1 tablet by mouth every 6 (six) hours as needed for headache or migraine.       Febuxostat 80 MG TABS Take 80 mg by mouth every evening.       fenofibrate micronized (LOFIBRA) 200 MG capsule Take 200 mg by mouth daily.       gabapentin (NEURONTIN) 300 MG capsule Take 300 mg by mouth 3 (three) times daily.       HYDROcodone-acetaminophen (NORCO) 10-325 MG tablet Take 1 tablet by mouth in the morning and at bedtime.       levothyroxine (SYNTHROID, LEVOTHROID) 200 MCG tablet Take 200 mcg by mouth at bedtime.       metFORMIN (GLUCOPHAGE-XR) 500 MG 24 hr tablet Take 500 mg by mouth 2 (two) times daily.       metoprolol succinate (TOPROL-XL) 50 MG 24 hr tablet Take 1 tablet (50 mg total) by mouth daily. (Patient taking differently: Take 50 mg by  mouth at bedtime.) 90 tablet 3   NEO-SYNEPHRINE COLD/ALLRG MILD 0.25 % nasal spray Place 1 spray  into both nostrils every 6 (six) hours as needed for congestion.       nicotine polacrilex (NICORETTE) 4 MG gum Take 4 mg by mouth as needed for smoking cessation.       pantoprazole (PROTONIX) 40 MG tablet Take 40 mg by mouth at bedtime as needed (for heartburn).       pravastatin (PRAVACHOL) 80 MG tablet Take 80 mg by mouth daily.       pseudoephedrine (SUDAFED) 120 MG 12 hr tablet Take 120 mg by mouth every 12 (twelve) hours as needed for congestion.       sacubitril-valsartan (ENTRESTO) 24-26 MG Take 1 tablet by mouth 2 (two) times daily. 60 tablet 1   spironolactone (ALDACTONE) 25 MG tablet Take 0.5 tablets (12.5 mg total) by mouth daily. 15 tablet 1   tadalafil (CIALIS) 20 MG tablet Take 20 mg by mouth daily as needed for erectile dysfunction.        temazepam (RESTORIL) 30 MG capsule Take 30 mg by mouth at bedtime.       escitalopram (LEXAPRO) 5 MG tablet Take 10 mg by mouth daily. (Patient not taking: Reported on 12/19/2023)                  Home: Home Living Family/patient expects to be discharged to:: Private residence Living Arrangements: Spouse/significant other Available Help at Discharge: Family, Friend(s), Available PRN/intermittently Type of Home: House Home Access: Stairs to enter Secretary/administrator of Steps: 2 Entrance Stairs-Rails: None Home Layout: One level Bathroom Shower/Tub: Health visitor: Handicapped height Home Equipment: Standard Environmental consultant, Medical laboratory scientific officer - single point, IT sales professional, Information systems manager, Grab bars - tub/shower, Wheelchair - power Additional Comments: Pt lives with wife and kids who all work during the day. Pt's power w/c is broken. Pt uses RW around home, transport chair for community   Functional History: Prior Function Prior Level of Function : Needs assist, Driving, History of Falls (last six months) Mobility Comments: Pt states off and  on he needs help with bed mobility, uses w/c, and some days he is able to walk with RW mod I. Frequent falls 25+ in last year ADLs Comments: some days needs helps.   Functional Status:  Mobility: Bed Mobility Overal bed mobility: Needs Assistance Bed Mobility: Sit to Supine Supine to sit: HOB elevated, Contact guard, Used rails Sit to supine: Contact guard assist General bed mobility comments: CGA, modified roll towards Rt side, cues for technique, slow and effortful due to rib pain. Transfers Overall transfer level: Needs assistance Equipment used: Hemi-walker Transfers: Sit to/from Stand Sit to Stand: Contact guard assist, Min assist Bed to/from chair/wheelchair/BSC transfer type:: Step pivot Step pivot transfers: Min assist, +2 physical assistance, +2 safety/equipment General transfer comment: verbal cues for hand placement. minA from recliner for initial transfer, CGA from bed Ambulation/Gait Ambulation/Gait assistance: Min assist Gait Distance (Feet): 30 Feet (additional trial of 20') Assistive device: Hemi-walker Gait Pattern/deviations: Step-to pattern, Wide base of support, Ataxic General Gait Details: pt with slowed step-to gait, widened BOS. Verbal cues from PT to maintain hemiwalker more lateral to BOS than anterior to BOS Gait velocity: reduced Gait velocity interpretation: <1.31 ft/sec, indicative of household ambulator Pre-gait activities: pt marches with severe ataxic, decreased foot clearance, wide BOS, and eccentric control.   ADL: ADL Overall ADL's : Needs assistance/impaired Eating/Feeding: Set up, Sitting Grooming: Minimal assistance, Sitting, Oral care, Wash/dry face Grooming Details (indicate cue type and reason): assist to open small containers but pt able to manage brushing  teeth seated at sink. Upper Body Bathing: Moderate assistance, Sitting Lower Body Bathing: Moderate assistance, Sitting/lateral leans Upper Body Dressing : Moderate assistance,  Sitting Lower Body Dressing: Maximal assistance, Sitting/lateral leans Toilet Transfer: Minimal assistance, +2 for safety/equipment Toileting- Clothing Manipulation and Hygiene: Set up, Sitting/lateral lean Functional mobility during ADLs: Minimal assistance, +2 for safety/equipment (hemiwalker) General ADL Comments: Pt able to mobilize to sink in room with trial of hemiwalker and close chair follow. pt reporting ataxia varies and feeling better than yesterday. good carryover of cues on hemiwalker use   Cognition: Cognition Orientation Level: Oriented X4 Cognition Arousal: Alert Behavior During Therapy: WFL for tasks assessed/performed     Blood pressure 115/86, pulse 97, temperature 98.5 F (36.9 C), resp. rate 17, height 5\' 9"  (1.753 m), weight 89 kg, SpO2 93%. Physical Exam  General: No acute distress Mood and affect are appropriate Heart: Regular rate and rhythm no rubs murmurs or extra sounds Lungs: coarse rhonchi clear with cough , occ wheeze, breathing unlabored, no rales  Abdomen: Positive bowel sounds, soft nontender to palpation, nondistended Extremities: No clubbing, cyanosis, or edema Skin: No evidence of breakdown, no evidence of rash Neurologic: Cranial nerves II through XII intact, motor strength is 5/5 in RIght  deltoid, bicep, tricep, grip,5/5 right and 4/5 left  hip flexor, knee extensors, ankle dorsiflexor and plantar flexor Left grip 4/5 Sensory exam normal sensation to light touch in bilateral upper and lower extremities Cerebellar exam normal finger to nose to finger as well as heel to shin in right upper and lower extremities, LUE NT due to sling, LLE moderate ataxia with heel shin Musculoskeletal: Left shoulder in sling . No joint swelling    Lab Results Last 48 Hours        Results for orders placed or performed during the hospital encounter of 12/19/23 (from the past 48 hours)  Glucose, capillary     Status: Abnormal    Collection Time: 12/23/23  3:33 PM   Result Value Ref Range    Glucose-Capillary 139 (H) 70 - 99 mg/dL      Comment: Glucose reference range applies only to samples taken after fasting for at least 8 hours.  Glucose, capillary     Status: Abnormal    Collection Time: 12/23/23  7:59 PM  Result Value Ref Range    Glucose-Capillary 212 (H) 70 - 99 mg/dL      Comment: Glucose reference range applies only to samples taken after fasting for at least 8 hours.    Comment 1 Notify RN    Glucose, capillary     Status: Abnormal    Collection Time: 12/24/23  8:00 AM  Result Value Ref Range    Glucose-Capillary 120 (H) 70 - 99 mg/dL      Comment: Glucose reference range applies only to samples taken after fasting for at least 8 hours.  Glucose, capillary     Status: Abnormal    Collection Time: 12/24/23 11:58 AM  Result Value Ref Range    Glucose-Capillary 121 (H) 70 - 99 mg/dL      Comment: Glucose reference range applies only to samples taken after fasting for at least 8 hours.  Glucose, capillary     Status: Abnormal    Collection Time: 12/24/23  4:45 PM  Result Value Ref Range    Glucose-Capillary 115 (H) 70 - 99 mg/dL      Comment: Glucose reference range applies only to samples taken after fasting for at least 8 hours.  Glucose,  capillary     Status: None    Collection Time: 12/24/23  9:19 PM  Result Value Ref Range    Glucose-Capillary 92 70 - 99 mg/dL      Comment: Glucose reference range applies only to samples taken after fasting for at least 8 hours.  Glucose, capillary     Status: Abnormal    Collection Time: 12/25/23  6:10 AM  Result Value Ref Range    Glucose-Capillary 126 (H) 70 - 99 mg/dL      Comment: Glucose reference range applies only to samples taken after fasting for at least 8 hours.  Glucose, capillary     Status: Abnormal    Collection Time: 12/25/23  7:44 AM  Result Value Ref Range    Glucose-Capillary 117 (H) 70 - 99 mg/dL      Comment: Glucose reference range applies only to samples taken after  fasting for at least 8 hours.  Glucose, capillary     Status: Abnormal    Collection Time: 12/25/23 11:55 AM  Result Value Ref Range    Glucose-Capillary 113 (H) 70 - 99 mg/dL      Comment: Glucose reference range applies only to samples taken after fasting for at least 8 hours.  CBC     Status: Abnormal    Collection Time: 12/25/23 12:12 PM  Result Value Ref Range    WBC 9.8 4.0 - 10.5 K/uL    RBC 4.34 4.22 - 5.81 MIL/uL    Hemoglobin 12.6 (L) 13.0 - 17.0 g/dL    HCT 84.1 (L) 32.4 - 52.0 %    MCV 86.4 80.0 - 100.0 fL    MCH 29.0 26.0 - 34.0 pg    MCHC 33.6 30.0 - 36.0 g/dL    RDW 40.1 02.7 - 25.3 %    Platelets 333 150 - 400 K/uL    nRBC 0.0 0.0 - 0.2 %      Comment: Performed at [facility], [address] [place], [place] [address]  Basic metabolic panel     Status: Abnormal    Collection Time: 12/25/23 12:12 PM  Result Value Ref Range    Sodium 133 (L) 135 - 145 mmol/L    Potassium 4.3 3.5 - 5.1 mmol/L    Chloride 96 (L) 98 - 111 mmol/L    CO2 22 22 - 32 mmol/L    Glucose, Bld 115 (H) 70 - 99 mg/dL      Comment: Glucose reference range applies only to samples taken after fasting for at least 8 hours.    BUN 23 (H) 6 - 20 mg/dL    Creatinine, Ser 3.47 0.61 - 1.24 mg/dL    Calcium 9.6 8.9 - 42.5 mg/dL    GFR, Estimated >95 >63 mL/min      Comment: (NOTE) Calculated using the CKD-EPI Creatinine Equation (2021)      Anion gap 15 5 - 15      Comment: Performed at [facility], [address] [place], [place] [address]       Imaging Results (Last 48 hours)  DG Chest 1V REPEAT Same Day Result Date: 12/25/2023 CLINICAL DATA:  Dyspnea and cough. EXAM: CHEST - 1 VIEW SAME DAY COMPARISON:  12/19/2023 FINDINGS: Interval development of patchy airspace opacity in the left mid lung and left base as well as the right base, suspicious for multifocal pneumonia. No substantial pleural effusion. Cardiopericardial silhouette is at upper limits of normal for size. No acute bony  abnormality.  IMPRESSION: Interval development of patchy airspace opacity in the left mid lung and left base as well as the right base, suspicious for multifocal pneumonia. Electronically Signed   By: Kennith Center M.D.   On: 12/25/2023 10:05           Blood pressure 115/86, pulse 97, temperature 98.5 F (36.9 C), resp. rate 17, height 5\' 9"  (1.753 m), weight 89 kg, SpO2 93%.   Medical Problem List and Plan: 1. Functional deficits secondary to TBI             -patient may  shower             -ELOS/Goals: 14-18d Sup 2.  Antithrombotics: -DVT/anticoagulation:  Mechanical: Sequential compression devices, below knee Bilateral lower extremities             -antiplatelet therapy: N/A--Plavix to be held for a week per Dr. Jordan Likes --Resume on 03/04 3. Pain Management:  On IV hydromorphone and Oxycodone prn.              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 Mood/Behavior/Sleep: LCSW to follow for evaluation and support.              -antipsychotic agents: N/A   5. Neuropsych/cognition: This patient is capable of making decisions on his own behalf. 6. Skin/Wound Care: Routine pressure measures 7. Fluids/Electrolytes/Nutrition: Monitor I/O. Check CMET in am 8.  Left shoulder dislocation: NWB with sling X 2 weeks. Follow up with ortho in 2 weeks.  Dr Ave Filter did L RTC repair several years ago 9.  Nasal Fracture: Continue Keflex thru 03/05-->follow up with ENT in a week 10.  Left chest wall pain/SOB: Wean oxygen. Encourage pulmonary hygiene 11. Chronic HFrEF: Compensated. Daily weight and strict I/O             --ON Jardiance, Metoprolol, Pravastatin, Spironolactone, Entresto 12. Anxiety d/o: Continue Klonopin prn 13. Insomnia: On Restoril 30 mg/hs 14. Wheezing: Hx smoking plus not taking deep breaths due to left rib painFlutter valve qid for pulmonary hygiene. Will schedule  duo nebs qid for now.   15.  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  Additionally, I have reviewed and concur with the physician assistant's documentation above."  Erick Colace M.D. [facility] Fellow Am [employer] of [employer] Med and Rehab Diplomate Am Board of Electrodiagnostic Med Fellow Am Board of Interventional Pain

## 2023-12-25 NOTE — Discharge Summary (Signed)
 [facility]  Physician Discharge Summary   Patient ID: Dustin Sampson MRN: [id number] DOB/AGE: 59/06/1966 59 y.o.  Admit date: 12/19/2023 Discharge date:   12/25/2023   PCP: Jarrett Soho, PA-C  DISCHARGE DIAGNOSES:    Non-insulin dependent type 2 diabetes mellitus (HCC)   Essential hypertension   Hypothyroidism   PVD (peripheral vascular disease) (HCC)   History of CVA (cerebrovascular accident)   MGUS (monoclonal gammopathy of unknown significance)   GAD (generalized anxiety disorder)   Chronic HFrEF (heart failure with reduced ejection fraction) (HCC)   Ataxia   SDH (subdural hematoma) (HCC)   PATIENT BEING DISCHARGED/TRANSFERRED TO [facility]  RECOMMENDATIONS FOR OUTPATIENT FOLLOW UP: Plavix can resumed on 12/27/2023 Patient needs f/u with Dr. Jearld Fenton with ENT Needs f/u with Dr. Odis Hollingshead with ortho for left shoulder Thyroid function tests to be repeated in 4-6 weeks    Home Health:[facility]  Equipment/Devices:None   CODE STATUS:Full   DISCHARGE CONDITION: fair  Diet recommendation: Heart healthy  INITIAL HISTORY: 58 y.o. male with medical history significant for anxiety, insomnia, ataxia, history of CVA, hypothyroidism, hypertension, PAD, and chronic HFrEF who presented with injuries to his nose and right shoulder after a fall at home.  Patient has had ataxia since 2018.  Uses a walker at home.  Had a mechanical fall.  Found to have small subdural hematoma, left shoulder dislocation and mildly displaced nasal bone fracture.   Consultants: Phone discussion with the ENT, orthopedics, neurosurgery.   Procedures: Suture of laceration over nose, REMOVED ON 12/25/23    HOSPITAL COURSE:   Subdural hematoma Secondary to mechanical fall.  CT head was repeated and showed stable findings.  Neurologically patient is stable. Imaging studies were reviewed by Dr. Jordan Likes with neurosurgery.  He recommends holding Plavix for 1 week, can be resumed on  March 5.     Nasal bone fracture with mild displacement This was discussed with Dr. Jearld Fenton with ENT who recommends follow-up in 1 week.  Also recommended cephalexin which is being administered.   Laceration over nose This was sutured.  It has been 5 days since suture was placed.  Suture can be removed today.   He has been given Tdap.   Left shoulder dislocation/left-sided rib cage pain This was reduced in the emergency department.  Discussed with orthopedics.  Has to be nonweightbearing and in a sling for 2 weeks.  Outpatient follow-up with orthopedics in 2 weeks. Patient was placed on scheduled Percocet due to poor pain control.  Seems to be better now.  Can continue with bowel regimen. Repeat chest x-ray did not show any abnormalities.   Cough with left-sided chest pain Most likely due to injury sustained by his fall.  Chest x-rays did not show any acute findings.  Looks like he had worsening symptoms overnight and chest x-ray was repeated this morning which raised concern for opacities in the left lung.  Could be atelectasis.  He has been afebrile.  WBC is normal. Will give him a 5-day course of doxycycline. IS.   Chronic systolic CHF LVEF is 20 to 25% based on echo done in January 2025.  Appears to be compensated.  Continue metoprolol, Entresto, spironolactone and Jardiance. Creatinine has been at baseline.    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  Uses a walker at home.  Now with immobility of his left arm he will have significant difficulty at home.  Does not think he will be able to do well at home. Seen by physical  therapy who recommends inpatient rehab.   Hypothyroidism Continue with levothyroxine.  TSH was 11.5 in January.  Thyroid function test were repeated and shows that TSH is 5.2 and a free T4 is 1.38.  Continue current dose of levothyroxine.  Will recommend thyroid function test be rechecked in 4 to 6 weeks.   History of stroke Holding Plavix due to SDH.   Continue statin.   Peripheral artery disease Holding Plavix.  Continue statin.   Seborrheic dermatitis/yeast infection groin Will initiate antifungal treatment to face.  Nystatin powder to groin.   Stable for discharge to [facility].  PERTINENT LABS:  The results of significant diagnostics from this hospitalization (including imaging, microbiology, ancillary and laboratory) are listed below for reference.      Labs:   Basic Metabolic Panel: Recent Labs  Lab 12/20/23 0418 12/21/23 0710 12/22/23 0748 12/23/23 0734 12/25/23 1212  NA 135 136 136 134* 133*  K 4.0 4.1 3.9 3.9 4.3  CL 106 98 99 99 96*  CO2 22 24 23 22 22   GLUCOSE 153* 129* 154* 121* 115*  BUN 15 16 23* 27* 23*  CREATININE 1.22 1.27* 1.32* 1.32* 1.12  CALCIUM 8.7* 9.3 9.0 8.6* 9.6    CBC: Recent Labs  Lab 12/19/23 1114 12/20/23 0418 12/22/23 0748 12/25/23 1212  WBC 12.6* 8.9 10.0 9.8  HGB 14.4 12.0* 12.6* 12.6*  HCT 42.5 34.8* 37.7* 37.5*  MCV 86.7 87.7 86.3 86.4  PLT 250 234 290 333     CBG: Recent Labs  Lab 12/24/23 1645 12/24/23 2119 12/25/23 0610 12/25/23 0744 12/25/23 1155  GLUCAP 115* 92 126* 117* 113*     IMAGING STUDIES DG Chest 1V REPEAT Same Day Result Date: 12/25/2023 CLINICAL DATA:  Dyspnea and cough. EXAM: CHEST - 1 VIEW SAME DAY COMPARISON:  12/19/2023 FINDINGS: Interval development of patchy airspace opacity in the left mid lung and left base as well as the right base, suspicious for multifocal pneumonia. No substantial pleural effusion. Cardiopericardial silhouette is at upper limits of normal for size. No acute bony abnormality. IMPRESSION: Interval development of patchy airspace opacity in the left mid lung and left base as well as the right base, suspicious for multifocal pneumonia. Electronically Signed   By: Kennith Center M.D.   On: 12/25/2023 10:05   DG CHEST PORT 1 VIEW Result Date: 12/21/2023 CLINICAL DATA:  Cough EXAM: PORTABLE CHEST 1 VIEW COMPARISON:  12/19/2023,  11/14/2023 FINDINGS: The heart size and mediastinal contours are within normal limits. No focal airspace consolidation, pleural effusion, or pneumothorax. Callus formation of the posterolateral left sixth rib related to prior fracture. IMPRESSION: No active disease. Electronically Signed   By: Duanne Guess D.O.   On: 12/21/2023 14:43   CT Head Wo Contrast Result Date: 12/19/2023 CLINICAL DATA:  Head trauma with coagulopathy. Standing fall to the left side with blood thinners. Laceration to the nose. EXAM: CT HEAD WITHOUT CONTRAST TECHNIQUE: Contiguous axial images were obtained from the base of the skull through the vertex without intravenous contrast. RADIATION DOSE REDUCTION: This exam was performed according to the departmental dose-optimization program which includes automated exposure control, adjustment of the mA and/or kV according to patient size and/or use of iterative reconstruction technique. COMPARISON:  12/19/2023 at 11:41 a.m. FINDINGS: Brain: Small focal area of increased attenuation again demonstrated in the right side of the falx measuring 4 mm in depth. This may represent a focal subdural hemorrhage. No change since the prior study from earlier today. No new hemorrhage or progression. No  mass-effect or midline shift. Mild cerebral atrophy. Ventricular dilatation consistent with central atrophy. Low-attenuation changes in the deep white matter consistent with chronic small vessel ischemia. Old lacunar infarct in the right cerebellum. Vascular: No hyperdense vessel or unexpected calcification. Skull: Calvarium appears intact. No acute depressed skull fractures. Depressed nasal bone fractures are again demonstrated without progression. Circumscribed cystic lesion in the anterior maxilla is unchanged, possibly a dentigerous cyst. Sinuses/Orbits: Mucosal thickening in the paranasal sinuses. Mastoid air cells are clear. Other: None. IMPRESSION: 1. No significant change since prior study from  earlier today. 2. Again demonstrated is a small focal subdural hemorrhage along the right side of the falx measuring 4 mm. No progression or significant mass-effect. 3. Chronic atrophy and small vessel ischemic changes. 4. Depressed nasal bone fractures are again seen. 5. Cystic structure in the anterior maxilla likely representing dentigerous cyst. Electronically Signed   By: Burman Nieves M.D.   On: 12/19/2023 19:18   DG Shoulder Left Result Date: 12/19/2023 CLINICAL DATA:  Status post left shoulder dislocation reduction. EXAM: LEFT SHOULDER - 2+ VIEW COMPARISON:  Earlier today. FINDINGS: The previously demonstrated anterior shoulder dislocation has been reduced. No visible fracture. Mild greater tuberosity hyperostosis. IMPRESSION: 1. Reduction of the previously demonstrated anterior shoulder dislocation. 2. Mild greater tuberosity hyperostosis. This can be seen with chronic rotator cuff tendinopathy. Electronically Signed   By: Beckie Salts M.D.   On: 12/19/2023 15:05   CT Head Wo Contrast Result Date: 12/19/2023 CLINICAL DATA:  Head trauma, coagulopathy (Age 41-64y); Facial trauma, blunt; Neck trauma, dangerous injury mechanism (Age 59-64y) EXAM: CT HEAD WITHOUT CONTRAST CT MAXILLOFACIAL WITHOUT CONTRAST CT CERVICAL SPINE WITHOUT CONTRAST TECHNIQUE: Multidetector CT imaging of the head, cervical spine, and maxillofacial structures were performed using the standard protocol without intravenous contrast. Multiplanar CT image reconstructions of the cervical spine and maxillofacial structures were also generated. RADIATION DOSE REDUCTION: This exam was performed according to the departmental dose-optimization program which includes automated exposure control, adjustment of the mA and/or kV according to patient size and/or use of iterative reconstruction technique. COMPARISON:  None Available. FINDINGS: CT HEAD FINDINGS Brain: Small area of subdural hemorrhage along the anterior falx measuring 4 mm (series  5, image 46). No hydrocephalus. No mass effect. No mass lesion. 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 No CT evidence of an acute cortical infarct. Vascular: No hyperdense vessel or unexpected calcification. Skull: Normal. Negative for fracture or focal lesion. Other: None. CT MAXILLOFACIAL FINDINGS Osseous: Mildly displaced fracture of the nasal bone on the left with overlying soft tissue swelling. Fracture may also involve the bony nasal septum. Orbits: Negative. No traumatic or inflammatory finding. Sinuses: No middle ear or mastoid effusion. Paranasal sinuses are notable for pansinus mucosal thickening. Soft tissues: Soft tissue swelling overlying the left-sided nasal bone fracture. CT CERVICAL SPINE FINDINGS Alignment: Normal. Skull base and vertebrae: No acute fracture. No primary bone lesion or focal pathologic process. Soft tissues and spinal canal: No prevertebral fluid or swelling. No visible canal hematoma. Disc levels:  No CT evidence of high-grade spinal canal stenosis Upper chest: Negative. Other: None IMPRESSION: 1. Small area of subdural hemorrhage along the anterior falx measuring 4 mm. No mass effect. 2. Mildly displaced fracture of the nasal bone on the left with overlying soft tissue swelling. Fracture may also involve the bony nasal septum. 3. No acute fracture or traumatic subluxation of the cervical spine. Electronically Signed   By: Lorenza Cambridge M.D.   On: 12/19/2023 12:34   CT  Cervical Spine Wo Contrast Result Date: 12/19/2023 CLINICAL DATA:  Head trauma, coagulopathy (Age 54-64y); Facial trauma, blunt; Neck trauma, dangerous injury mechanism (Age 4-64y) EXAM: CT HEAD WITHOUT CONTRAST CT MAXILLOFACIAL WITHOUT CONTRAST CT CERVICAL SPINE WITHOUT CONTRAST TECHNIQUE: Multidetector CT imaging of the head, cervical spine, and maxillofacial structures were performed using the standard protocol without intravenous contrast. Multiplanar CT image reconstructions of the cervical spine  and maxillofacial structures were also generated. RADIATION DOSE REDUCTION: This exam was performed according to the departmental dose-optimization program which includes automated exposure control, adjustment of the mA and/or kV according to patient size and/or use of iterative reconstruction technique. COMPARISON:  None Available. FINDINGS: CT HEAD FINDINGS Brain: Small area of subdural hemorrhage along the anterior falx measuring 4 mm (series 5, image 46). No hydrocephalus. No mass effect. No mass lesion. 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 No CT evidence of an acute cortical infarct. Vascular: No hyperdense vessel or unexpected calcification. Skull: Normal. Negative for fracture or focal lesion. Other: None. CT MAXILLOFACIAL FINDINGS Osseous: Mildly displaced fracture of the nasal bone on the left with overlying soft tissue swelling. Fracture may also involve the bony nasal septum. Orbits: Negative. No traumatic or inflammatory finding. Sinuses: No middle ear or mastoid effusion. Paranasal sinuses are notable for pansinus mucosal thickening. Soft tissues: Soft tissue swelling overlying the left-sided nasal bone fracture. CT CERVICAL SPINE FINDINGS Alignment: Normal. Skull base and vertebrae: No acute fracture. No primary bone lesion or focal pathologic process. Soft tissues and spinal canal: No prevertebral fluid or swelling. No visible canal hematoma. Disc levels:  No CT evidence of high-grade spinal canal stenosis Upper chest: Negative. Other: None IMPRESSION: 1. Small area of subdural hemorrhage along the anterior falx measuring 4 mm. No mass effect. 2. Mildly displaced fracture of the nasal bone on the left with overlying soft tissue swelling. Fracture may also involve the bony nasal septum. 3. No acute fracture or traumatic subluxation of the cervical spine. Electronically Signed   By: Lorenza Cambridge M.D.   On: 12/19/2023 12:34   CT Maxillofacial Wo Contrast Result Date: 12/19/2023 CLINICAL  DATA:  Head trauma, coagulopathy (Age 27-64y); Facial trauma, blunt; Neck trauma, dangerous injury mechanism (Age 1-64y) EXAM: CT HEAD WITHOUT CONTRAST CT MAXILLOFACIAL WITHOUT CONTRAST CT CERVICAL SPINE WITHOUT CONTRAST TECHNIQUE: Multidetector CT imaging of the head, cervical spine, and maxillofacial structures were performed using the standard protocol without intravenous contrast. Multiplanar CT image reconstructions of the cervical spine and maxillofacial structures were also generated. RADIATION DOSE REDUCTION: This exam was performed according to the departmental dose-optimization program which includes automated exposure control, adjustment of the mA and/or kV according to patient size and/or use of iterative reconstruction technique. COMPARISON:  None Available. FINDINGS: CT HEAD FINDINGS Brain: Small area of subdural hemorrhage along the anterior falx measuring 4 mm (series 5, image 46). No hydrocephalus. No mass effect. No mass lesion. 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 No CT evidence of an acute cortical infarct. Vascular: No hyperdense vessel or unexpected calcification. Skull: Normal. Negative for fracture or focal lesion. Other: None. CT MAXILLOFACIAL FINDINGS Osseous: Mildly displaced fracture of the nasal bone on the left with overlying soft tissue swelling. Fracture may also involve the bony nasal septum. Orbits: Negative. No traumatic or inflammatory finding. Sinuses: No middle ear or mastoid effusion. Paranasal sinuses are notable for pansinus mucosal thickening. Soft tissues: Soft tissue swelling overlying the left-sided nasal bone fracture. CT CERVICAL SPINE FINDINGS Alignment: Normal. Skull base and vertebrae: No acute fracture.  No primary bone lesion or focal pathologic process. Soft tissues and spinal canal: No prevertebral fluid or swelling. No visible canal hematoma. Disc levels:  No CT evidence of high-grade spinal canal stenosis Upper chest: Negative. Other: None  IMPRESSION: 1. Small area of subdural hemorrhage along the anterior falx measuring 4 mm. No mass effect. 2. Mildly displaced fracture of the nasal bone on the left with overlying soft tissue swelling. Fracture may also involve the bony nasal septum. 3. No acute fracture or traumatic subluxation of the cervical spine. Electronically Signed   By: Lorenza Cambridge M.D.   On: 12/19/2023 12:34   DG Shoulder Left Result Date: 12/19/2023 CLINICAL DATA:  Fall.  Left shoulder deformity. EXAM: LEFT SHOULDER - 2+ VIEW COMPARISON:  None Available. FINDINGS: The humeral head is anteriorly inferiorly and medially dislocated with respect to the glenoid fossa. Mild inferior humeral head-neck junction and mild inferior glenoid degenerative osteophytosis. Moderate acromioclavicular joint space narrowing with mild peripheral osteophytosis. Mild-to-moderate distal lateral subacromial spurring. No acute fracture is seen. The visualized portion of the left lung is unremarkable. IMPRESSION: 1. Anterior, inferior, and medial dislocation of the humeral head with respect to the glenoid fossa. 2. 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 Electronically Signed   By: Neita Garnet M.D.   On: 12/19/2023 12:00   DG Chest 1 View Result Date: 12/19/2023 CLINICAL DATA:  Fall.  Left shoulder deformity. EXAM: CHEST  1 VIEW COMPARISON:  Chest radiographs 11/14/2023 FINDINGS: Cardiac silhouette and mediastinal contours are within normal limits. The lungs are clear. No pleural effusion or pneumothorax. Anterior dislocation of the left shoulder as also seen on dedicated radiographs. Moderate dextrocurvature of the midthoracic spine with mild-to-moderate multilevel degenerative disc changes. IMPRESSION: 1.  No acute cardiopulmonary disease process. 2. Anterior dislocation of the left shoulder. Electronically Signed   By: Neita Garnet M.D.   On: 12/19/2023 11:57   DG Hips Bilat W or Wo Pelvis 3-4 Views Result Date:  11/27/2023 CLINICAL DATA:  Repeated falls, sacral and bilateral hip pain. EXAM: SACRUM AND COCCYX - 2+ VIEW; DG HIP (WITH OR WITHOUT PELVIS) 3-4V BILAT COMPARISON:  None Available. FINDINGS: Pelvis and hips: Subjectively under mineralized. No evidence of acute or healing/healed fracture. Both femoral heads are well seated in the respective acetabula, no hip dislocation. Pubic rami are intact. Mild left hip joint space narrowing. Mild bilateral acetabular spurring. No erosive change or evidence of avascular necrosis. Bilateral lower extremity vascular stents. Pubic symphysis is congruent. Sacrum and coccyx: No evidence of sacrococcygeal fracture. Sacral ala are maintained. The sacroiliac joints are congruent. Pelvic vascular calcifications are seen. IMPRESSION: 1. No acute fracture of the pelvis, hips, sacrum or coccyx. 2. Mild bilateral hip degenerative change. Electronically Signed   By: Narda Rutherford M.D.   On: 11/27/2023 12:21   DG Sacrum/Coccyx Result Date: 11/27/2023 CLINICAL DATA:  Repeated falls, sacral and bilateral hip pain. EXAM: SACRUM AND COCCYX - 2+ VIEW; DG HIP (WITH OR WITHOUT PELVIS) 3-4V BILAT COMPARISON:  None Available. FINDINGS: Pelvis and hips: Subjectively under mineralized. No evidence of acute or healing/healed fracture. Both femoral heads are well seated in the respective acetabula, no hip dislocation. Pubic rami are intact. Mild left hip joint space narrowing. Mild bilateral acetabular spurring. No erosive change or evidence of avascular necrosis. Bilateral lower extremity vascular stents. Pubic symphysis is congruent. Sacrum and coccyx: No evidence of sacrococcygeal fracture. Sacral ala are maintained. The sacroiliac joints are congruent. Pelvic vascular calcifications are seen. IMPRESSION: 1. No acute fracture  of the pelvis, hips, sacrum or coccyx. 2. Mild bilateral hip degenerative change. Electronically Signed   By: Narda Rutherford M.D.   On: 11/27/2023 12:21   CT Cervical Spine  Wo Contrast Result Date: 11/27/2023 CLINICAL DATA:  Multiple falls EXAM: CT CERVICAL SPINE WITHOUT CONTRAST TECHNIQUE: Multidetector CT imaging of the cervical spine was performed without intravenous contrast. Multiplanar CT image reconstructions were also generated. RADIATION DOSE REDUCTION: This exam was performed according to the departmental dose-optimization program which includes automated exposure control, adjustment of the mA and/or kV according to patient size and/or use of iterative reconstruction technique. COMPARISON:  None Available. FINDINGS: Alignment: No traumatic listhesis. Straightening and mild reversal of the normal cervical lordosis. Skull base and vertebrae: No acute fracture. No primary bone lesion or focal pathologic process. Soft tissues and spinal canal: No prevertebral fluid or swelling. No visible canal hematoma. Disc levels: Degenerative changes in the cervical spine. No high-grade spinal canal stenosis. Upper chest: No focal pulmonary opacity or pleural effusion. Emphysema. IMPRESSION: No acute fracture or traumatic listhesis in the cervical spine. Electronically Signed   By: Wiliam Ke M.D.   On: 11/27/2023 12:03   CT Head Wo Contrast Result Date: 11/27/2023 CLINICAL DATA:  Head trauma, coagulopathy EXAM: CT HEAD WITHOUT CONTRAST TECHNIQUE: Contiguous axial images were obtained from the base of the skull through the vertex without intravenous contrast. RADIATION DOSE REDUCTION: This exam was performed according to the departmental dose-optimization program which includes automated exposure control, adjustment of the mA and/or kV according to patient size and/or use of iterative reconstruction technique. COMPARISON:  02/03/2021 CT head FINDINGS: Brain: No evidence of acute infarction, hemorrhage, mass, mass effect, or midline shift. No hydrocephalus or extra-axial fluid collection. Vascular: No hyperdense vessel. Skull: Negative for fracture or focal lesion. Sinuses/Orbits: No  acute finding. Other: The mastoid air cells are well aerated. IMPRESSION: No acute intracranial process. Electronically Signed   By: Wiliam Ke M.D.   On: 11/27/2023 11:28    DISCHARGE EXAMINATION: See progress note from earlier today.  DISPOSITION: [facility]     Current Inpatient Medications: Scheduled:  cephALEXin  500 mg Oral Q6H   doxycycline  100 mg Oral Q12H   empagliflozin  10 mg Oral Daily   febuxostat  80 mg Oral QPM   feeding supplement  237 mL Oral BID BM   insulin aspart  0-5 Units Subcutaneous QHS   insulin aspart  0-6 Units Subcutaneous TID WC   ketoconazole   Topical BID   levothyroxine  200 mcg Oral QHS   metoprolol succinate  50 mg Oral QHS   nystatin   Topical BID   oxyCODONE-acetaminophen  1 tablet Oral TID   polyethylene glycol  17 g Oral Daily   pravastatin  80 mg Oral Daily   sacubitril-valsartan  1 tablet Oral BID   senna-docusate  2 tablet Oral BID   sodium chloride flush  3 mL Intravenous Q12H   spironolactone  12.5 mg Oral Daily   temazepam  30 mg Oral QHS   Continuous: [id number] **OR** acetaminophen, benzonatate, clonazePAM, HYDROmorphone (DILAUDID) injection, metoprolol tartrate, oxyCODONE      Follow-up Information     Netta Cedars, MD. Schedule an appointment as soon as possible for a visit .   Specialty: Orthopedic Surgery Contact information: [address] [place] [address] [phone]         Suzanna Obey, MD. Schedule an appointment as soon as possible for a visit .   Specialty: Otolaryngology Contact  information: [address] [place] [address] [phone]         Jarrett Soho, PA-C. Schedule an appointment as soon as possible for a visit in 1 week(s).   Specialty: Family Medicine Why: post hospitalization follow up Contact information: [address] [place] [address] [phone]                 TOTAL DISCHARGE TIME: 35 mins  Kelee Cunningham  Rito Ehrlich  [facility] [employer] on [email or web address]  12/25/2023, 1:51 PM

## 2023-12-25 NOTE — Progress Notes (Signed)
 Erick Colace, MD  Physician Physical Medicine and Rehabilitation   PMR Pre-admission    Signed   Date of Service: 12/25/2023  1:46 PM  Related encounter: ED to Hosp-Admission (Current) from 12/19/2023 in [facility]   Signed     Expand All Collapse All  PMR Admission Coordinator Pre-Admission Assessment   Patient: Dustin Sampson is an 58 y.o., male MRN: [id number] DOB: 30-Apr-1966 Height: 5\' 9"  (175.3 cm) Weight: 89 kg   Insurance Information HMO:     PPO:      PCP:      IPA:      80/20:      OTHER:  PRIMARY: [employer]       Policy#: Policy #: [id number],     Subscriber:  CM NameDondra Spry      Phone#: [phone]     Fax#: [phone] Pre-Cert#: [id number] auth for CIR from Dondra Spry with updates due to fax listed above on 3/10      Employer:  Benefits:  Phone #: [phone]     Name:  Eff. Date: 10/25/23     Deduct: $1500 (met)      Out of Pocket Max: $5900 (met)      Life Max: n/a CIR: $337 copay, then 70% coinsurance      SNF: 70% Outpatient:      Co-Pay: $72/visit Home Health: 70%      Co-Pay: 30% DME: 70%     Co-Pay: 30% Providers:  SECONDARY:       Policy#:      Phone#:    Artist:       Phone#:    The Engineer, materials Information Summary" for patients in Inpatient Rehabilitation Facilities with attached "Privacy Act Statement-Health Care Records" was provided and verbally reviewed with: Patient and Family   Emergency Contact Information Contact Information       Name Relation Home Work Mobile    Delgrande,KIM Spouse [phone]             Other Contacts   None on File        Current Medical History  Patient Admitting Diagnosis: SDH    History of Present Illness: Pt is a 58 y/o male with PMH of ataxia, anxiety, insomnia, CVA, hypothyroidism, HTN, PAD, HFrEF, and frequent falls who presented to [facility] on 12/19/23 following a fall at home.  He is on plavix and with obvious facial injury.  Imaging revealed open nasal  bone fracture, L anterior shoulder dislocation, and SDH.  Neurosurgery consulted and recommended repeat imaging.  ED labs notable for WBC 12.6, Na 134, and glucose 192.  Shoulder reduced in the ED and pt to be NWB with sling.  Follow up CT was stable and neurosurgery recommended holding plavix.     Patient's medical record from [facility] has been reviewed by the rehabilitation admission coordinator and physician.   Past Medical History      Past Medical History:  Diagnosis Date   Anxiety     Arthritis     Diabetes mellitus without complication (HCC)     Gout     Hyperlipemia     Hypertension     Hypothyroidism     Insomnia     Pneumonia     Stroke [facility])     Thyroid disease            Has the patient had major surgery during 100 days prior to admission? No  Family History   family history includes Atrial fibrillation in his mother; Autoimmune disease in his sister; Colon cancer in his father; Healthy in his daughter and son; Lung cancer in his father.   Current Medications  Current Medications    Current Facility-Administered Medications:    acetaminophen (TYLENOL) tablet 650 mg, 650 mg, Oral, Q6H PRN, 650 mg at 12/20/23 0746 **OR** acetaminophen (TYLENOL) suppository 650 mg, 650 mg, Rectal, Q6H PRN, Opyd, Lavone Neri, MD   benzonatate (TESSALON) capsule 200 mg, 200 mg, Oral, TID PRN, Dolly Rias, MD, 200 mg at 12/21/23 1707   cephALEXin (KEFLEX) capsule 500 mg, 500 mg, Oral, Q6H, Osvaldo Shipper, MD, 500 mg at 12/22/23 0844   clonazePAM (KLONOPIN) tablet 0.5 mg, 0.5 mg, Oral, Daily PRN, Opyd, Lavone Neri, MD, 0.5 mg at 12/21/23 1706   empagliflozin (JARDIANCE) tablet 10 mg, 10 mg, Oral, Daily, Opyd, Lavone Neri, MD, 10 mg at 12/22/23 0851   febuxostat (ULORIC) tablet 80 mg, 80 mg, Oral, QPM, Opyd, Lavone Neri, MD, 80 mg at 12/21/23 1706   HYDROmorphone (DILAUDID) injection 0.5 mg, 0.5 mg, Intravenous, Q4H PRN, Opyd, Lavone Neri, MD, 0.5 mg at 12/22/23 0202   insulin aspart  (novoLOG) injection 0-5 Units, 0-5 Units, Subcutaneous, QHS, Opyd, Timothy S, MD   insulin aspart (novoLOG) injection 0-6 Units, 0-6 Units, Subcutaneous, TID WC, Opyd, Lavone Neri, MD, 1 Units at 12/22/23 9629   levothyroxine (SYNTHROID) tablet 200 mcg, 200 mcg, Oral, QHS, Opyd, Timothy S, MD, 200 mcg at 12/21/23 2157   metoprolol succinate (TOPROL-XL) 24 hr tablet 50 mg, 50 mg, Oral, QHS, Opyd, Timothy S, MD, 50 mg at 12/21/23 2158   metoprolol tartrate (LOPRESSOR) injection 2.5 mg, 2.5 mg, Intravenous, Q6H PRN, Osvaldo Shipper, MD, 2.5 mg at 12/20/23 1817   oxyCODONE (Oxy IR/ROXICODONE) immediate release tablet 5 mg, 5 mg, Oral, Q4H PRN, Opyd, Lavone Neri, MD, 5 mg at 12/22/23 5284   oxyCODONE-acetaminophen (PERCOCET) 7.5-325 MG per tablet 1 tablet, 1 tablet, Oral, TID, Osvaldo Shipper, MD, 1 tablet at 12/22/23 0850   polyethylene glycol (MIRALAX / GLYCOLAX) packet 17 g, 17 g, Oral, Daily, Osvaldo Shipper, MD, 17 g at 12/22/23 1324   pravastatin (PRAVACHOL) tablet 80 mg, 80 mg, Oral, Daily, Opyd, Lavone Neri, MD, 80 mg at 12/22/23 0850   sacubitril-valsartan (ENTRESTO) 24-26 mg per tablet, 1 tablet, Oral, BID, Opyd, Lavone Neri, MD, 1 tablet at 12/22/23 4010   senna-docusate (Senokot-S) tablet 2 tablet, 2 tablet, Oral, BID, Osvaldo Shipper, MD, 2 tablet at 12/22/23 2725   sodium chloride flush (NS) 0.9 % injection 3 mL, 3 mL, Intravenous, Q12H, Opyd, Timothy S, MD, 3 mL at 12/22/23 3664   spironolactone (ALDACTONE) tablet 12.5 mg, 12.5 mg, Oral, Daily, Opyd, Timothy S, MD, 12.5 mg at 12/22/23 0851   temazepam (RESTORIL) capsule 30 mg, 30 mg, Oral, QHS, Opyd, Lavone Neri, MD, 30 mg at 12/21/23 2157     Patients Current Diet:  Diet Order                  Diet Heart Room service appropriate? Yes; Fluid consistency: Thin  Diet effective now                         Precautions / Restrictions Precautions Precautions: Fall Precaution/Restrictions Comments: ataxia Restrictions Weight Bearing  Restrictions Per Provider Order: Yes LUE Weight Bearing Per Provider Order: Non weight bearing Other Position/Activity Restrictions: sling for 2 weeks    Has the patient had  2 or more falls or a fall with injury in the past year? Yes   Prior Activity Level Household: PMH of ataxia, has good days and bad days, mostly able to be mod I at home with DME, does not drive   Prior Functional Level Self Care: Did the patient need help bathing, dressing, using the toilet or eating? Needed some help   Indoor Mobility: Did the patient need assistance with walking from room to room (with or without device)? Independent   Stairs: Did the patient need assistance with internal or external stairs (with or without device)? Needed some help   Functional Cognition: Did the patient need help planning regular tasks such as shopping or remembering to take medications? Needed some help   Patient Information Are you of Hispanic, Latino/a,or Spanish origin?: A. No, not of Hispanic, Latino/a, or Spanish origin What is your race?: A. White Do you need or want an interpreter to communicate with a doctor or health care staff?: 0. No   Patient's Response To:  Health Literacy and Transportation Is the patient able to respond to health literacy and transportation needs?: Yes Health Literacy - How often do you need to have someone help you when you read instructions, pamphlets, or other written material from your doctor or pharmacy?: Never In the past 12 months, has lack of transportation kept you from medical appointments or from getting medications?: No In the past 12 months, has lack of transportation kept you from meetings, work, or from getting things needed for daily living?: No   Journalist, newspaper / Equipment Home Equipment: Standard Environmental consultant, Medical laboratory scientific officer - single point, IT sales professional, Information systems manager, Grab bars - tub/shower, Wheelchair - power   Prior Device Use: Indicate devices/aids used by the patient prior to  current illness, exacerbation or injury?  Walker or [employer]   Current Functional Level Cognition   Orientation Level: Oriented X4    Extremity Assessment (includes Sensation/Coordination)   Upper Extremity Assessment: LUE deficits/detail, Right hand dominant LUE Deficits / Details: L shoulder reduced, dislocated from fall. NWB. encouraged gentle AROM of hand, wrist and elbow as tolerated to decrease stiffness. educated on positioning and assisted with sling adjustment LUE: Unable to fully assess due to pain LUE Sensation: WNL LUE Coordination: decreased gross motor, decreased fine motor  Lower Extremity Assessment: Defer to PT evaluation RLE Deficits / Details: Grossly MMT 5/5 RLE Coordination: decreased gross motor LLE Deficits / Details: Gross MMT 4/5 LLE Coordination: decreased gross motor     ADLs   Overall ADL's : Needs assistance/impaired Eating/Feeding: Set up, Sitting Grooming: Minimal assistance, Sitting, Oral care, Wash/dry face Grooming Details (indicate cue type and reason): assist to open small containers but pt able to manage brushing teeth seated at sink. Upper Body Bathing: Moderate assistance, Sitting Lower Body Bathing: Moderate assistance, Sitting/lateral leans Upper Body Dressing : Moderate assistance, Sitting Lower Body Dressing: Maximal assistance, Sitting/lateral leans Toilet Transfer: Minimal assistance, +2 for safety/equipment Toileting- Clothing Manipulation and Hygiene: Set up, Sitting/lateral lean Functional mobility during ADLs: Minimal assistance, +2 for safety/equipment (hemiwalker) General ADL Comments: Pt able to mobilize to sink in room with trial of hemiwalker and close chair follow. pt reporting ataxia varies and feeling better than yesterday. good carryover of cues on hemiwalker use     Mobility   Overal bed mobility: Needs Assistance Bed Mobility: Supine to Sit Supine to sit: Min assist, HOB elevated Sit to supine: Contact guard assist, HOB  elevated General bed mobility comments: handheld assist to lift  trunk     Transfers   Overall transfer level: Needs assistance Equipment used: Hemi-walker Transfers: Sit to/from Stand, Bed to chair/wheelchair/BSC Sit to Stand: Min assist, +2 safety/equipment Bed to/from chair/wheelchair/BSC transfer type:: Step pivot Step pivot transfers: Min assist, +2 physical assistance, +2 safety/equipment General transfer comment: Cues for hemiwalker use to stand and pivot to recliner, Min A x 2 for safety and intermittent +2 physical assist to correct balance     Ambulation / Gait / Stairs / Wheelchair Mobility   Ambulation/Gait Ambulation/Gait assistance: Min assist, +2 physical assistance, Mod assist Gait Distance (Feet): 2 Feet Assistive device: Straight cane Gait Pattern/deviations: Step-to pattern, Decreased stride length, Ataxic, Staggering left, Staggering right, Trunk flexed, Wide base of support General Gait Details: Pt walked with greatly increased ataxic pattern, wide BOS, and had difficulty controlling SPC. Required min A cueing for increasing weight shifts and safety. mod A from falling posteriorly. Gait velocity: reduced Gait velocity interpretation: <1.31 ft/sec, indicative of household ambulator Pre-gait activities: pt marches with severe ataxic, decreased foot clearance, wide BOS, and eccentric control.     Posture / Balance Dynamic Sitting Balance Sitting balance - Comments: pt sits EOB with no UE support  . No LOB Balance Overall balance assessment: Needs assistance Sitting-balance support: No upper extremity supported, Single extremity supported, Feet supported Sitting balance-Leahy Scale: Fair Sitting balance - Comments: pt sits EOB with no UE support  . No LOB Postural control: Posterior lean Standing balance support: During functional activity, Reliant on assistive device for balance, No upper extremity supported, Single extremity supported Standing balance-Leahy Scale:  Poor Standing balance comment: freuqent LOB, x2 for safety, min-mod A to maintain balance     Special needs/care consideration N/a      Previous Home Environment (from acute therapy documentation) Living Arrangements: Spouse/significant other Available Help at Discharge: Family, Friend(s), Available PRN/intermittently Type of Home: House Home Layout: One level Home Access: Stairs to enter Entrance Stairs-Rails: None Entrance Stairs-Number of Steps: 2 Bathroom Shower/Tub: Health visitor: Handicapped height Home Care Services: Yes Type of Home Care Services: Home PT, Home OT Home Care Agency (if known): [facility] Additional Comments: Pt lives with wife and kids who all work during the day. Pt's power w/c is broken. Pt uses RW around home, transport chair for community   Discharge Living Setting Plans for Discharge Living Setting: Patient's home, Lives with (comment) (spouse, 2 adult children, and MIL) Type of Home at Discharge: House Discharge Home Layout: One level Discharge Home Access: Stairs to enter Entrance Stairs-Rails: None Entrance Stairs-Number of Steps: 2 Discharge Bathroom Shower/Tub: Walk-in shower Discharge Bathroom Toilet: Handicapped height Discharge Bathroom Accessibility: Yes How Accessible: Accessible via walker Does the patient have any problems obtaining your medications?: No   Social/Family/Support Systems Patient Roles: Spouse, Parent Anticipated Caregiver: spouse will be primary contact: Westley Gambles Anticipated Caregiver's Contact Information: [phone] Ability/Limitations of Caregiver: Selena Batten works, will have coverage from 2 adult children and friends Caregiver Availability: 24/7 Discharge Plan Discussed with Primary Caregiver: Yes Is Caregiver In Agreement with Plan?: Yes Does Caregiver/Family have Issues with Lodging/Transportation while Pt is in Rehab?: No   Goals Patient/Family Goal for Rehab: PT/OT supervision, SLP mod  I Expected length of stay: 14-18 days Additional Information: Discharge plan: home with family to provide 24/7 supervision Pt/Family Agrees to Admission and willing to participate: Yes Program Orientation Provided & Reviewed with Pt/Caregiver Including Roles  & Responsibilities: Yes   Decrease burden of Care through IP rehab admission: n/a  Possible need for SNF placement upon discharge:  Not anticipated.  Plan home with 24/7 assist from multiple family members.     Patient Condition: I have reviewed medical records from [facility], spoken with CM, and patient, spouse, and daughter. I met with patient at the bedside for inpatient rehabilitation assessment.  Patient will benefit from ongoing PT, OT, and SLP, can actively participate in 3 hours of therapy a day 5 days of the week, and can make measurable gains during the admission.  Patient will also benefit from the coordinated team approach during an Inpatient Acute Rehabilitation admission.  The patient will receive intensive therapy as well as Rehabilitation physician, nursing, social worker, and care management interventions.  Due to safety, skin/wound care, disease management, medication administration, pain management, and patient education the patient requires 24 hour a day rehabilitation nursing.  The patient is currently min assist with mobility and basic ADLs.  Discharge setting and therapy post discharge at home with home health is anticipated.  Patient has agreed to participate in the Acute Inpatient Rehabilitation Program and will admit today.   Preadmission Screen Completed By:  Stephania Fragmin, PT, DPT 12/22/2023 11:57 AM ______________________________________________________________________   Discussed status with Dr. Wynn Banker on 12/25/23  at 1:46 PM  and received approval for admission today.   Admission Coordinator:  Stephania Fragmin, PT, time 1:46 PM Dorna Bloom 12/25/23     Assessment/Plan: Diagnosis: TBI Does the need for close,  24 hr/day Medical supervision in concert with the patient's rehab needs make it unreasonable for this patient to be served in a less intensive setting? 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 Yes Does the patient require coordinated care of a physician, rehab nurse, PT, OT, and SLP to address physical and functional deficits in the context of the above medical diagnosis(es)? Yes Addressing deficits in the following areas: balance, endurance, locomotion, strength, transferring, bowel/bladder control, bathing, dressing, toileting, cognition, and psychosocial support Can the patient actively participate in an intensive therapy program of at least 3 hrs of therapy 5 days a week? Yes The potential for patient to make measurable gains while on inpatient rehab is good Anticipated functional outcomes upon discharge from inpatient rehab: supervision PT, supervision OT, supervision SLP Estimated rehab length of stay to reach the above functional goals is: 14-18d Anticipated discharge destination: Home 10. Overall Rehab/Functional Prognosis: good     MD Signature: Erick Colace M.D. [facility] Fellow Am Acad of Phys Med and Rehab Diplomate Am Board of Electrodiagnostic Med Fellow Am Board of Interventional Pain            Revision History

## 2023-12-25 NOTE — Progress Notes (Addendum)
 Mobility Specialist Progress Note:    12/25/23 1154  Mobility  Activity Transferred from bed to chair  Level of Assistance Minimal assist, patient does 75% or more  Assistive Device Other (Comment) (HHA)  Distance Ambulated (ft) 6 ft  LUE Weight Bearing Per Provider Order NWB  Activity Response Tolerated well  Mobility Referral Yes  Mobility visit 1 Mobility  Mobility Specialist Start Time (ACUTE ONLY) 1125  Mobility Specialist Stop Time (ACUTE ONLY) 1139  Mobility Specialist Time Calculation (min) (ACUTE ONLY) 14 min   Pt received in bed agreeable to mobility. Unable to find hemiwalker, for safety we just transferred to the chair as well as took steps by the bed. Pt required MinA w/ HHA to get to the chair. LUE remained NWB. Situated in chair w/ call bell and personal belongings in reach. All needs met. Chair alarm on.  Thompson Grayer Mobility Specialist  Please contact vis Secure Chat or  Rehab Office [phone]

## 2023-12-25 NOTE — Progress Notes (Signed)
 [facility] PROGRESS NOTE   Dustin Sampson ZOX:[id number] DOB: [birth year] DOA: 12/19/2023  PCP: Jarrett Soho, PA-C  Brief History: 58 y.o. male with medical history significant for anxiety, insomnia, ataxia, history of CVA, hypothyroidism, hypertension, PAD, and chronic HFrEF who presented with injuries to his nose and right shoulder after a fall at home.  Patient has had ataxia since 2018.  Uses a walker at home.  Had a mechanical fall.  Found to have small subdural hematoma, left shoulder dislocation and mildly displaced nasal bone fracture.  Consultants: Phone discussion with the ENT, orthopedics, neurosurgery.  Procedures: Suture of laceration over nose    Subjective/Interval History: Patient still concerned about his left-sided rib cage pain.  Continues to have occasional cough.  No shortness of breath.  Otherwise he feels well.     Assessment/Plan:  Subdural hematoma Secondary to mechanical fall.  CT head was repeated and showed stable findings.  Neurologically patient is stable. Imaging studies were reviewed by Dr. Jordan Likes with neurosurgery.  He recommends holding Plavix for 1 week, can be resumed on March 5.    Nasal bone fracture with mild displacement This was discussed with Dr. Jearld Fenton with ENT who recommends follow-up in 1 week.  Also recommended cephalexin which is being administered.  Laceration over nose This was sutured.  It has been 5 days since suture was placed.  Suture can be removed today.   He has been given Tdap.  Left shoulder dislocation/left-sided rib cage pain This was reduced in the emergency department.  Discussed with orthopedics.  Has to be nonweightbearing and in a sling for 2 weeks.  Outpatient follow-up with orthopedics in 2 weeks. Patient was placed on scheduled Percocet due to poor pain control.  Seems to be better now.  Can continue with bowel regimen. Repeat chest x-ray did not show any abnormalities.  Cough with left-sided chest  pain Most likely due to injury sustained by his fall.  Chest x-ray was repeated on 2/27 and did not show any acute findings.  Looks like he had worsening symptoms overnight and chest x-ray was repeated this morning which raise concern for opacities in the left lung.  Could be atelectasis.  He has been afebrile.  Will check CBC and basic metabolic panel.  Will give him a 5-day course of doxycycline.  Chronic systolic CHF LVEF is 20 to 25% based on echo done in January 2025.  Appears to be compensated.  Continue with home medications.  Noted to be on Aldactone Entresto Jardiance and metoprolol.   Respiratory status is stable.  Telemetry shows sinus tachycardia at times.  Heart rate is better controlled.  Elevated heart rate most likely due to pain.  Continue to monitor on telemetry for now. Continue metoprolol, Entresto, spironolactone and Jardiance. Creatinine has been at baseline.   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  Uses a walker at home.  Now with immobility of his left arm he will have significant difficulty at home.  Does not think he will be able to do well at home. Seen by physical therapy who recommends inpatient rehab.  Hypothyroidism Continue with levothyroxine.  TSH was 11.5 in January.  Thyroid function test were repeated and shows that TSH is 5.2 and a free T4 is 1.38.  Continue current dose of levothyroxine.  Will recommend thyroid function test be rechecked in 4 to 6 weeks.  History of stroke Holding Plavix due to SDH.  Continue statin.  Peripheral artery disease Holding Plavix.  Continue statin.  Seborrheic dermatitis/yeast infection groin Will initiate antifungal treatment to face.  Nystatin powder to groin.   DVT Prophylaxis: SCDs only for now Code Status: Full code Family Communication: Discussed with patient Disposition Plan: CIR is being pursued     Medications: Scheduled:  cephALEXin  500 mg Oral Q6H   doxycycline  100 mg Oral Q12H    empagliflozin  10 mg Oral Daily   febuxostat  80 mg Oral QPM   feeding supplement  237 mL Oral BID BM   insulin aspart  0-5 Units Subcutaneous QHS   insulin aspart  0-6 Units Subcutaneous TID WC   levothyroxine  200 mcg Oral QHS   metoprolol succinate  50 mg Oral QHS   oxyCODONE-acetaminophen  1 tablet Oral TID   polyethylene glycol  17 g Oral Daily   pravastatin  80 mg Oral Daily   sacubitril-valsartan  1 tablet Oral BID   senna-docusate  2 tablet Oral BID   sodium chloride flush  3 mL Intravenous Q12H   spironolactone  12.5 mg Oral Daily   temazepam  30 mg Oral QHS   Continuous: ZOX:[id number] **OR** acetaminophen, benzonatate, clonazePAM, HYDROmorphone (DILAUDID) injection, metoprolol tartrate, oxyCODONE  Antibiotics: Anti-infectives (From admission, onward)    Start     Dose/Rate Route Frequency Ordered Stop   12/25/23 1230  doxycycline (VIBRA-TABS) tablet 100 mg        100 mg Oral Every 12 hours 12/25/23 1130 12/30/23 0959   12/20/23 0100  cephALEXin (KEFLEX) capsule 500 mg        500 mg Oral Every 6 hours 12/20/23 0059 12/27/23 0159   12/19/23 1445  cephALEXin (KEFLEX) capsule 500 mg        500 mg Oral  Once 12/19/23 1444 12/19/23 1505   12/19/23 0000  cephALEXin (KEFLEX) 500 MG capsule        500 mg Oral 4 times daily 12/19/23 1449         Objective:  Vital Signs  Vitals:   12/24/23 1644 12/24/23 2040 12/25/23 0424 12/25/23 0743  BP: 121/72 (!) 150/86 109/79 115/86  Pulse: (!) 104 98 95 97  Resp: 17 17 15 17   Temp: 98.9 F (37.2 C) (!) 97.1 F (36.2 C) 97.8 F (36.6 C) 98.5 F (36.9 C)  TempSrc: Oral  Oral   SpO2: 93% 97% 96% 93%  Weight:      Height:        Intake/Output Summary (Last 24 hours) at 12/25/2023 1130 Last data filed at 12/25/2023 1016 Gross per 24 hour  Intake 100 ml  Output 2250 ml  Net -2150 ml   Filed Weights   12/19/23 0954  Weight: 89 kg    General appearance: Awake alert.  In no distress Resp: Clear to auscultation  bilaterally.  Normal effort Cardio: S1-S2 is normal regular.  No S3-S4.  No rubs murmurs or bruit GI: Abdomen is soft.  Nontender nondistended.  Bowel sounds are present normal.  No masses organomegaly Extremities: No edema.  Full range of motion of lower extremities. Neurologic: Alert and oriented x3.  No focal neurological deficits.      Lab Results:  Data Reviewed: I have personally reviewed following labs and reports of the imaging studies  CBC: Recent Labs  Lab 12/19/23 1114 12/20/23 0418 12/22/23 0748  WBC 12.6* 8.9 10.0  HGB 14.4 12.0* 12.6*  HCT 42.5 34.8* 37.7*  MCV 86.7 87.7 86.3  PLT 250 234 290    Basic Metabolic Panel: Recent Labs  Lab  12/19/23 1114 12/20/23 0418 12/21/23 0710 12/22/23 0748 12/23/23 0734  NA 134* 135 136 136 134*  K 4.8 4.0 4.1 3.9 3.9  CL 104 106 98 99 99  CO2 23 22 24 23 22   GLUCOSE 192* 153* 129* 154* 121*  BUN 16 15 16  23* 27*  CREATININE 1.24 1.22 1.27* 1.32* 1.32*  CALCIUM 9.1 8.7* 9.3 9.0 8.6*    GFR: Estimated Creatinine Clearance: 67.3 mL/min (A) (by C-G formula based on SCr of 1.32 mg/dL (H)).   CBG: Recent Labs  Lab 12/24/23 1158 12/24/23 1645 12/24/23 2119 12/25/23 0610 12/25/23 0744  GLUCAP 121* 115* 92 126* 117*    Radiology Studies: DG Chest 1V REPEAT Same Day Result Date: 12/25/2023 CLINICAL DATA:  Dyspnea and cough. EXAM: CHEST - 1 VIEW SAME DAY COMPARISON:  12/19/2023 FINDINGS: Interval development of patchy airspace opacity in the left mid lung and left base as well as the right base, suspicious for multifocal pneumonia. No substantial pleural effusion. Cardiopericardial silhouette is at upper limits of normal for size. No acute bony abnormality. IMPRESSION: Interval development of patchy airspace opacity in the left mid lung and left base as well as the right base, suspicious for multifocal pneumonia. Electronically Signed   By: Kennith Center M.D.   On: 12/25/2023 10:05        LOS: 4 days   Manisha Cancel  Rito Ehrlich  [facility] Pager on [email or web address]  12/25/2023, 11:30 AM

## 2023-12-25 NOTE — Progress Notes (Signed)
 Sutures removed from nose per orders. Patient tolerated well.

## 2023-12-26 DIAGNOSIS — S065XAA Traumatic subdural hemorrhage with loss of consciousness status unknown, initial encounter: Secondary | ICD-10-CM | POA: Diagnosis not present

## 2023-12-26 LAB — CBC WITH DIFFERENTIAL/PLATELET
Abs Immature Granulocytes: 0.04 10*3/uL (ref 0.00–0.07)
Basophils Absolute: 0.1 10*3/uL (ref 0.0–0.1)
Basophils Relative: 1 %
Eosinophils Absolute: 0.3 10*3/uL (ref 0.0–0.5)
Eosinophils Relative: 4 %
HCT: 36.2 % — ABNORMAL LOW (ref 39.0–52.0)
Hemoglobin: 12.1 g/dL — ABNORMAL LOW (ref 13.0–17.0)
Immature Granulocytes: 0 %
Lymphocytes Relative: 13 %
Lymphs Abs: 1.2 10*3/uL (ref 0.7–4.0)
MCH: 28.7 pg (ref 26.0–34.0)
MCHC: 33.4 g/dL (ref 30.0–36.0)
MCV: 86 fL (ref 80.0–100.0)
Monocytes Absolute: 0.8 10*3/uL (ref 0.1–1.0)
Monocytes Relative: 8 %
Neutro Abs: 7 10*3/uL (ref 1.7–7.7)
Neutrophils Relative %: 74 %
Platelets: 272 10*3/uL (ref 150–400)
RBC: 4.21 MIL/uL — ABNORMAL LOW (ref 4.22–5.81)
RDW: 13.9 % (ref 11.5–15.5)
WBC: 9.4 10*3/uL (ref 4.0–10.5)
nRBC: 0 % (ref 0.0–0.2)

## 2023-12-26 LAB — COMPREHENSIVE METABOLIC PANEL
ALT: 16 U/L (ref 0–44)
AST: 15 U/L (ref 15–41)
Albumin: 3 g/dL — ABNORMAL LOW (ref 3.5–5.0)
Alkaline Phosphatase: 77 U/L (ref 38–126)
Anion gap: 11 (ref 5–15)
BUN: 29 mg/dL — ABNORMAL HIGH (ref 6–20)
CO2: 22 mmol/L (ref 22–32)
Calcium: 9.2 mg/dL (ref 8.9–10.3)
Chloride: 101 mmol/L (ref 98–111)
Creatinine, Ser: 1.2 mg/dL (ref 0.61–1.24)
GFR, Estimated: >60 mL/min (ref >60)
Glucose, Bld: 121 mg/dL — ABNORMAL HIGH (ref 70–99)
Potassium: 3.8 mmol/L (ref 3.5–5.1)
Sodium: 134 mmol/L — ABNORMAL LOW (ref 135–145)
Total Bilirubin: 0.7 mg/dL (ref 0.0–1.2)
Total Protein: 7.3 g/dL (ref 6.5–8.1)

## 2023-12-26 LAB — GLUCOSE, CAPILLARY
Glucose-Capillary: 131 mg/dL — ABNORMAL HIGH (ref 70–99)
Glucose-Capillary: 126 mg/dL — ABNORMAL HIGH (ref 70–99)
Glucose-Capillary: 135 mg/dL — ABNORMAL HIGH (ref 70–99)
Glucose-Capillary: 123 mg/dL — ABNORMAL HIGH (ref 70–99)

## 2023-12-26 MED ORDER — FLUCONAZOLE 100 MG PO TABS
100.0000 mg | ORAL_TABLET | Freq: Every day | ORAL | Status: DC
  Administered 2023-12-27: 100 mg via ORAL
  Filled 2023-12-26 (×2): qty 1

## 2023-12-26 MED ORDER — SORBITOL 70 % SOLN
30.0000 mL | Freq: Once | Status: AC
  Administered 2023-12-26: 30 mL via ORAL
  Filled 2023-12-26: qty 30

## 2023-12-26 MED ORDER — FLUCONAZOLE 100 MG PO TABS
200.0000 mg | ORAL_TABLET | Freq: Once | ORAL | Status: AC
  Administered 2023-12-26: 200 mg via ORAL
  Filled 2023-12-26: qty 2

## 2023-12-26 MED ORDER — CLOPIDOGREL BISULFATE 75 MG PO TABS
75.0000 mg | ORAL_TABLET | Freq: Every day | ORAL | Status: DC
  Administered 2023-12-26 – 2024-01-10 (×16): 75 mg via ORAL
  Filled 2023-12-26 (×16): qty 1

## 2023-12-26 NOTE — Plan of Care (Signed)
  Problem: RH SKIN INTEGRITY Goal: RH STG SKIN FREE OF INFECTION/BREAKDOWN Description:  Manage skin free of infection/ breakdown  integrity with supervision   Outcome: Progressing   Problem: RH SAFETY Goal: RH STG ADHERE TO SAFETY PRECAUTIONS W/ASSISTANCE/DEVICE Description: STG Adhere to Safety Precautions With supervision Assistance/Device. Outcome: Progressing   Problem: RH PAIN MANAGEMENT Goal: RH STG PAIN MANAGED AT OR BELOW PT'S PAIN GOAL Description: <4 w/ prns Outcome: Progressing

## 2023-12-26 NOTE — Progress Notes (Signed)
 [facility] Individual Statement of Services  Patient Name:  Dustin Sampson  Date:  12/26/2023  Welcome to the [facility]patient [facility].  Our goal is to provide you with an individualized program based on your diagnosis and situation, designed to meet your specific needs.  With this comprehensive rehabilitation program, you will be expected to participate in at least 3 hours of rehabilitation therapies Monday-Friday, with modified therapy programming on the weekends.  Your rehabilitation program will include the following services:  Physical Therapy (PT), Occupational Therapy (OT), Speech Therapy (ST), 24 hour per day rehabilitation nursing, Neuropsychology, Care Coordinator, Rehabilitation Medicine, Nutrition Services, and Pharmacy Services  Weekly team conferences will be held on Tuesday to discuss your progress.  Your Inpatient Rehabilitation Care Coordinator will talk with you frequently to get your input and to update you on team discussions.  Team conferences with you and your family in attendance may also be held.  Expected length of stay:2 weeks   Overall anticipated outcome: supervision with cues  Depending on your progress and recovery, your program may change. Your Inpatient Rehabilitation Care Coordinator will coordinate services and will keep you informed of any changes. Your Inpatient Rehabilitation Care Coordinator's name and contact numbers are listed  below.  The following services may also be recommended but are not provided by the Inpatient Rehabilitation Center:   Home Health Rehabiltiation Services Outpatient Rehabilitation Services    Arrangements will be made to provide these services after discharge if needed.  Arrangements include referral to agencies that provide these services.  Your insurance has been verified to be:  [facility] Your primary doctor is:  Jarrett Soho  Pertinent information will be shared with your doctor and your  insurance company.  Inpatient Rehabilitation Care Coordinator:  Dossie Der, [place] [phone] or Luna Glasgow  Information discussed with and copy given to patient by: Lucy Chris, 12/26/2023, 8:54 AM

## 2023-12-26 NOTE — Evaluation (Signed)
 Speech Language Pathology Assessment and Plan  Patient Details  Name: Dustin Sampson MRN: [id number] Date of Birth: 03-27-1966  SLP Diagnosis: Dysarthria  Rehab Potential: Good ELOS: 14 days   Today's Date: 12/26/2023 SLP Individual Time: 5643-3295 SLP Individual Time Calculation (min): 63 min  Hospital Problem: Principal Problem:   Subdural hematoma (HCC)  Past Medical History:  Past Medical History:  Diagnosis Date   Anxiety    Arthritis    Diabetes mellitus without complication (HCC)    Gout    Hyperlipemia    Hypertension    Hypothyroidism    Insomnia    Pneumonia    Stroke [facility])    Thyroid disease    Past Surgical History:  Past Surgical History:  Procedure Laterality Date   ABDOMINAL AORTOGRAM W/LOWER EXTREMITY Bilateral 08/21/2020   Procedure: ABDOMINAL AORTOGRAM W/LOWER EXTREMITY;  Surgeon: Sherren Kerns, MD;  Location: [facility] INVASIVE CV LAB;  Service: Cardiovascular;  Laterality: Bilateral;   ABDOMINAL AORTOGRAM W/LOWER EXTREMITY N/A 09/04/2020   Procedure: ABDOMINAL AORTOGRAM W/LOWER EXTREMITY;  Surgeon: Sherren Kerns, MD;  Location: [facility] INVASIVE CV LAB;  Service: Cardiovascular;  Laterality: N/A;   COLONOSCOPY     x2   FEMORAL ARTERY EXPLORATION Left 09/04/2020   Procedure: FEMORAL ARTERY EXPLORATION;  Surgeon: Chuck Hint, MD;  Location[facility];  Service: Vascular;  Laterality: Left;   NASAL SINUS SURGERY     PERIPHERAL VASCULAR INTERVENTION Left 08/21/2020   Procedure: PERIPHERAL VASCULAR INTERVENTION;  Surgeon: Sherren Kerns, MD;  Location: [facility] INVASIVE CV LAB;  Service: Cardiovascular;  Laterality: Left;  sfa stent x 3   PERIPHERAL VASCULAR INTERVENTION  09/04/2020   Procedure: PERIPHERAL VASCULAR INTERVENTION;  Surgeon: Sherren Kerns, MD;  Location: [facility] CV [facility];  Service: Cardiovascular;;  Right SFA   RIGHT/LEFT HEART CATH AND CORONARY ANGIOGRAPHY N/A 11/16/2023   Procedure: RIGHT/LEFT HEART CATH AND CORONARY ANGIOGRAPHY;  Surgeon:  Yates Decamp, MD;  Location: [facility] INVAS[facility] CV [facility];  Service: Cardiovascular;  Laterality: N/A;   SHOULDER ARTHROSCOPY WITH ROTATOR CUFF REPAIR AND SUBACROMIAL DECOMPRESSION Left 02/04/2021   Procedure: SHOULDER ARTHROSCOPY WITH ROTATOR CUFF REPAIR AND SUBACROMIAL DECOMPRESSION;  Surgeon: Jones Broom, MD;  Location: [facility] ORS;  Service: Orthopedics;  Laterality: Left;   WISDOM TOOTH EXTRACTION     WRIST SURGERY     bilat    Assessment / Plan / Recommendation Clinical Impression  Dustin Sampson is a 58 year old  male with history of CVA with ataxia, HFrEF, insomnia, anxiety d/o, PAD, frequent falls who was admitted on 12/19/23 after tripping and falling onto and complaints of nose and right shoulder pain with epistaxis. He was not using walker when he fell and noted to have shoulder deformity at admission. Work up revealed small area of SDH along anterior falx and minimally displaced nasal Fx on left. Dr.Pool recommended repeat CT for monitoring and if stable OK for d/c as well as holding Plavix for 7 days. He developed SOB requiring oxygen on 02/27 with reports of left chest pain as well as cough. Has also had tachycardia felt to be pain mediated. CXR was negative. PT/OT consulted and patient noted to require min to mod assist due to wide based staggering gait as well as anxiety. CIR recommended due to functional decline. Patient was admitted to CIR on 12/25/23.  Cognitive/ Linguistic: COGNISTAT administered with pt WFL for all subsections. Pt denies awareness of any cognitive deficits post SDH. No skilled intervention warranted at this time.   Dysarthria: Pt  also presents with mild ataxic dysarthria. Oral mechanism exam was unremarkable and pt has natural dentition. During evaluation pt presented with imprecise articulation and flat affect however, intelligibility spared with pt remaining 100% intelligible up to the conversational level. Pt reports speech intelligbility and quality varies from normal to severe  throughout the day. He reports most notable reduced speech intelligibility early in the morning and with fatigue. SLP warranted to introduce and train pt in compensatory strategies to promote independence and QOL.   Dysphagia: BSE not completed at this time however, pt endorses s/s aspiration with liquids. Discussed with MD and further assessment warranted.   Pt would benefit from skilled SLP services to maximize dysarthria in order to maximize his independence prior to discharge. Anticipate pt will require supervision at home and f/u home health SLP services.     Skilled Therapeutic Interventions          Informal assessment measures and COGNISTAT administered. Please see full report for additional details.     SLP Assessment  Patient will need skilled Speech Lanaguage Pathology Services during CIR admission    Recommendations  SLP Diet Recommendations: Thin;Age appropriate regular solids Liquid Administration via: Straw;Cup Supervision: Patient able to self feed Oral Care Recommendations: Oral care BID Recommendations for Other Services: Neuropsych consult Patient destination: Home Follow up Recommendations: None Equipment Recommended: None recommended by SLP    SLP Frequency 1 to 3 out of 7 days   SLP Duration  SLP Intensity  SLP Treatment/Interventions 14 days  Minumum of 1-2 x/day, 30 to 90 minutes  Internal/external aids;Speech/Language facilitation;Cueing hierarchy;Functional tasks;Multimodal communication approach;Patient/family education;Environmental controls;Therapeutic Activities    Pain Pain Assessment Pain Scale: 0-10 Pain Score: 2  Pain Type: Chronic pain Pain Location: Rib cage Pain Orientation: Left Pain Intervention(s): Distraction  Prior Functioning Cognitive/Linguistic Baseline: Within functional limits Type of Home: House  Lives With: Spouse;Family Available Help at Discharge: Family;Friend(s);Available PRN/intermittently (wife home after 3pm, kids in  and out, friend also available if needed) Vocation: On disability  SLP Evaluation Cognition Overall Cognitive Status: Within Functional Limits for tasks assessed Arousal/Alertness: Awake/alert Orientation Level: Oriented X4 Year: 2025 Month: March Day of Week: Correct Attention: Focused Focused Attention: Appears intact Memory: Appears intact Awareness: Appears intact Problem Solving: Appears intact Executive Function: Reasoning Reasoning: Appears intact Safety/Judgment: Appears intact  Comprehension Auditory Comprehension Overall Auditory Comprehension: Appears within functional limits for tasks assessed Expression Expression Primary Mode of Expression: Verbal Verbal Expression Overall Verbal Expression: Appears within functional limits for tasks assessed Written Expression Dominant Hand: Right Oral Motor Oral Motor/Sensory Function Overall Oral Motor/Sensory Function: Within functional limits Motor Speech Overall Motor Speech: Impaired Respiration: Within functional limits Phonation: Normal Resonance: Within functional limits Articulation: Impaired Level of Impairment: Phrase Intelligibility: Intelligibility reduced Word: 75-100% accurate Phrase: 75-100% accurate Sentence: 75-100% accurate Conversation: 75-100% accurate Motor Planning: Not tested Motor Speech Errors: Aware Interfering Components: Premorbid status Effective Techniques: Slow rate;Over-articulate  Care Tool Care Tool Cognition Ability to hear (with hearing aid or hearing appliances if normally used Ability to hear (with hearing aid or hearing appliances if normally used): 0. Adequate - no difficulty in normal conservation, social interaction, listening to TV   Expression of Ideas and Wants Expression of Ideas and Wants: 4. Without difficulty (complex and basic) - expresses complex messages without difficulty and with speech that is clear and easy to understand   Understanding Verbal and  Non-Verbal Content Understanding Verbal and Non-Verbal Content: 4. 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 SLP Short Term Goal 2 (Week 1): Patient will recall 4 compensatory speech intelligibility strategeis with 100% acc with sup A.  Refer to Care Plan for Long Term Goals  Recommendations for other services: Neuropsych  Discharge Criteria: Patient will be discharged from SLP if patient refuses treatment 3 consecutive times without medical reason, if treatment goals not met, if there is a change in medical status, if patient makes no progress towards goals or if patient is discharged from hospital.  The above assessment, treatment plan, treatment alternatives and goals were discussed and mutually agreed upon: by patient  [name] 12/26/2023, 4:11 PM

## 2023-12-26 NOTE — Plan of Care (Signed)
  Problem: RH Balance Goal: LTG Patient will maintain dynamic standing with ADLs (OT) Description: LTG:  Patient will maintain dynamic standing balance with assist during activities of daily living (OT)  Flowsheets (Taken 12/26/2023 1630) LTG: Pt will maintain dynamic standing balance during ADLs with: Supervision/Verbal cueing   Problem: RH Grooming Goal: LTG Patient will perform grooming w/assist,cues/equip (OT) Description: LTG: Patient will perform grooming with assist, with/without cues using equipment (OT) Flowsheets (Taken 12/26/2023 1630) LTG: Pt will perform grooming with assistance level of: Independent with assistive device    Problem: RH Bathing Goal: LTG Patient will bathe all body parts with assist levels (OT) Description: LTG: Patient will bathe all body parts with assist levels (OT) Flowsheets (Taken 12/26/2023 1630) LTG: Pt will perform bathing with assistance level/cueing: Supervision/Verbal cueing   Problem: RH Dressing Goal: LTG Patient will perform upper body dressing (OT) Description: LTG Patient will perform upper body dressing with assist, with/without cues (OT). Flowsheets (Taken 12/26/2023 1630) LTG: Pt will perform upper body dressing with assistance level of: Minimal Assistance - Patient > 75% Goal: LTG Patient will perform lower body dressing w/assist (OT) Description: LTG: Patient will perform lower body dressing with assist, with/without cues in positioning using equipment (OT) Flowsheets (Taken 12/26/2023 1630) LTG: Pt will perform lower body dressing with assistance level of: Supervision/Verbal cueing   Problem: RH Toileting Goal: LTG Patient will perform toileting task (3/3 steps) with assistance level (OT) Description: LTG: Patient will perform toileting task (3/3 steps) with assistance level (OT)  Flowsheets (Taken 12/26/2023 1630) LTG: Pt will perform toileting task (3/3 steps) with assistance level: Supervision/Verbal cueing   Problem: RH Toilet  Transfers Goal: LTG Patient will perform toilet transfers w/assist (OT) Description: LTG: Patient will perform toilet transfers with assist, with/without cues using equipment (OT) Flowsheets (Taken 12/26/2023 1630) LTG: Pt will perform toilet transfers with assistance level of: Supervision/Verbal cueing   Problem: RH Tub/Shower Transfers Goal: LTG Patient will perform tub/shower transfers w/assist (OT) Description: LTG: Patient will perform tub/shower transfers with assist, with/without cues using equipment (OT) Flowsheets (Taken 12/26/2023 1630) LTG: Pt will perform tub/shower stall transfers with assistance level of: Supervision/Verbal cueing

## 2023-12-26 NOTE — Evaluation (Signed)
 Physical Therapy Assessment and Plan  Patient Details  Name: Dustin Sampson MRN: [id number] Date of Birth: 06/09/1966  PT Diagnosis: Abnormality of gait, Ataxic gait, Coordination disorder, Difficulty walking, Muscle weakness, and Pain in L ribs, shoulder Rehab Potential: Good ELOS: 2 weeks   Today's Date: 12/26/2023 PT Individual Time: 1045-1200 PT Individual Time Calculation (min): 75 min    Hospital Problem: Principal Problem:   Subdural hematoma (HCC)   Past Medical History:  Past Medical History:  Diagnosis Date   Anxiety    Arthritis    Diabetes mellitus without complication (HCC)    Gout    Hyperlipemia    Hypertension    Hypothyroidism    Insomnia    Pneumonia    Stroke [facility])    Thyroid disease    Past Surgical History:  Past Surgical History:  Procedure Laterality Date   ABDOMINAL AORTOGRAM W/LOWER EXTREMITY Bilateral 08/21/2020   Procedure: ABDOMINAL AORTOGRAM W/LOWER EXTREMITY;  Surgeon: Sherren Kerns, MD;  Location: [facility];  Service: Cardiovascular;  Laterality: Bilateral;   ABDOMINAL AORTOGRAM W/LOWER EXTREMITY N/A 09/04/2020   Procedure: ABDOMINAL AORTOGRAM W/LOWER EXTREMITY;  Surgeon: Sherren Kerns, MD;  Location: [facility];  Service: Cardiovascular;  Laterality: N/A;   COLONOSCOPY     x2   FEMORAL ARTERY EXPLORATION Left 09/04/2020   Procedure: FEMORAL ARTERY EXPLORATION;  Surgeon: Chuck Hint, MD;  Location: [facility] OR;  Service: Vascular;  Laterality: Left;   NASAL SINUS SURGERY     PERIPHERAL VASCULAR INTERVENTION Left 08/21/2020   Procedure: PERIPHERAL VASCULAR INTERVENTION;  Surgeon: Sherren Kerns, MD;  Location: [facility];  Service: Cardiovascular;  Laterality: Left;  sfa stent x 3   PERIPHERAL VASCULAR INTERVENTION  09/04/2020   Procedure: PERIPHERAL VASCULAR INTERVENTION;  Surgeon: Sherren Kerns, MD;  Location: [facility];  Service: Cardiovascular;;  Right SFA   RIGHT/LEFT HEART CATH AND  CORONARY ANGIOGRAPHY N/A 11/16/2023   Procedure: RIGHT/LEFT HEART CATH AND CORONARY ANGIOGRAPHY;  Surgeon: Yates Decamp, MD;  Location: [facility];  Service: Cardiovascular;  Laterality: N/A;   SHOULDER ARTHROSCOPY WITH ROTATOR CUFF REPAIR AND SUBACROMIAL DECOMPRESSION Left 02/04/2021   Procedure: SHOULDER ARTHROSCOPY WITH ROTATOR CUFF REPAIR AND SUBACROMIAL DECOMPRESSION;  Surgeon: Jones Broom, MD;  Location: [facility] ORS;  Service: Orthopedics;  Laterality: Left;   WISDOM TOOTH EXTRACTION     WRIST SURGERY     bilat    Assessment & Plan Clinical Impression:  Dustin Sampson is a 58 year old  male with history of CVA with ataxia, HFrEF, insomnia, anxiety d/o, PAD, frequent falls who was admitted on 12/19/23 after tripping and falling onto and complaints of nose and right shoulder pain with epistaxis. He was not using walker when he fell and noted to have shoulder deformity at admission. Work up revealed small area of SDH along anterior falx and minimally displaced nasal Fx on left. Dr.Pool recommended repeat CT for monitoring and if stable OK for d/c as well as holding Plavix for 7 days. Nasal laceration repaired and patient started on Keflex per discussion with Dr. Jearld Fenton who recommends follow up in one week. Left shoulder dislocation reduced and to be NWB with ortho follow up in 2 weeks.    He developed SOB requiring oxygen on 02/27 with reports of left chest pain as well as cough. Has also had tachycardia felt to be pain mediated. CXR was negative. PT/OT consulted and patient noted to require min to mod assist due  to wide based staggering gait as well as anxiety. Marland Kitchen CIR recommended due to functional decline.  Patient currently requires mod/min with mobility secondary to muscle weakness and ataxia and decreased coordination.  Prior to hospitalization, patient was supervision w/ some time alone with mobility and lived with Spouse, Family in a House home.  Home access is 2 from garage w/o rails, 4 STE from  secondary entrance w/ B rails.Stairs to enter.  Patient will benefit from skilled PT intervention to maximize safe functional mobility, minimize fall risk, and decrease caregiver burden for planned discharge home with 24 hour supervision.  Anticipate patient will benefit from follow up HH at discharge.  PT - End of Session Activity Tolerance: Tolerates 10 - 20 min activity with multiple rests Endurance Deficit: Yes PT Assessment Rehab Potential (ACUTE/IP ONLY): Good PT Barriers to Discharge: Inaccessible home environment;Lack of/limited family support PT Patient demonstrates impairments in the following area(s): Balance;Safety;Endurance;Motor;Pain PT Transfers Functional Problem(s): Bed Mobility;Bed to Chair;Car;Furniture PT Locomotion Functional Problem(s): Ambulation;Wheelchair Mobility;Stairs PT Plan PT Intensity: Minimum of 1-2 x/day ,45 to 90 minutes PT Frequency: 5 out of 7 days PT Duration Estimated Length of Stay: 2 weeks PT Treatment/Interventions: Ambulation/gait training;Community reintegration;Neuromuscular re-education;Stair training;UE/LE Strength taining/ROM;Wheelchair propulsion/positioning;Balance/vestibular training;Discharge planning;Skin care/wound management;Therapeutic Activities;UE/LE Coordination activities;Therapeutic Exercise;Patient/family education;Functional mobility training PT Transfers Anticipated Outcome(s): supervision PT Locomotion Anticipated Outcome(s): supervision w/ LRAD PT Recommendation Follow Up Recommendations: Home health PT Patient destination: Home Equipment Recommended: To be determined Equipment Details: pt has shower chair, RW, transport chair and power chair (which is broke)   PT Evaluation Precautions/Restrictions Precautions Precautions: Fall Shoulder Interventions: Shoulder sling/immobilizer Precaution/Restrictions Comments: ataxia Required Braces or Orthoses: Sling Restrictions Weight Bearing Restrictions Per Provider Order:  Yes LUE Weight Bearing Per Provider Order: Non weight bearing Other Position/Activity Restrictions: sling for 2 weeks until ortho follow-up. General Chart Reviewed: Yes Family/Caregiver Present: No Vital Signs  Pain Pain Assessment Pain Scale: 0-10 Pain Score: 7  Pain Type: Chronic pain Pain Location: Rib cage Pain Orientation: Left Pain Descriptors / Indicators: Aching;Sharp Pain Onset: On-going Pain Intervention(s): Distraction Multiple Pain Sites: Yes 2nd Pain Site Pain Score: 5 Pain Type: Acute pain Pain Location: Shoulder Pain Orientation: Left Pain Descriptors / Indicators: Aching;Sore Pain Interference Pain Interference Pain Effect on Sleep: 2. Occasionally (states it's hard to tell since he "doesn't sleep well anyway.") Pain Interference with Therapy Activities: 1. Rarely or not at all Pain Interference with Day-to-Day Activities: 1. Rarely or not at all Home Living/Prior Functioning Home Living Available Help at Discharge: Family;Friend(s);Available PRN/intermittently Type of Home: House Home Access: Stairs to enter [employer] of Steps: 2 from garage w/o rails, 4 STE from secondary entrance w/ B rails. Entrance Stairs-Rails: None Home Layout: One level Bathroom Shower/Tub: Walk-in shower Additional Comments: Pt lives with wife and kids who all work during the day. Pt's power w/c is broken. Pt uses RW around home, transport chair for community  Lives With: Spouse;Family Prior Function Level of Independence: Independent with transfers;Independent with gait (although w/ multiple falls at home)  Able to Take Stairs?: Yes Vocation: On disability Vision/Perception     Cognition Overall Cognitive Status: Within Functional Limits for tasks assessed Orientation Level: Oriented X4 Year: 2025 Month: March Day of Week: Correct Attention: Focused Focused Attention: Appears intact Memory: Appears intact Awareness: Appears intact Problem Solving: Appears  intact Executive Function: Reasoning Reasoning: Appears intact Safety/Judgment: Appears intact Sensation Sensation Light Touch: Appears Intact (does have neuropathy.) Coordination Gross Motor Movements are Fluid and Coordinated: No Fine Motor  Movements are Fluid and Coordinated: No Heel Shin Test: bilateral performance Motor  Motor Motor: Within Functional Limits;Ataxia Motor - Skilled Clinical Observations: not exhibiting ataxia today.  Pt states "waxing-waning", better some days.   Trunk/Postural Assessment  Cervical Assessment Cervical Assessment: Exceptions to [facility] (forward head) Thoracic Assessment Thoracic Assessment: Exceptions to [facility] (rounded shoulders) Lumbar Assessment Lumbar Assessment: Exceptions to [facility] (posterior pelvic tilt.) Postural Control Postural Control: Deficits on evaluation Protective Responses: delayed significantly.  Balance Balance Balance Assessed: Yes Static Sitting Balance Static Sitting - Balance Support: Feet supported Static Sitting - Level of Assistance: 5: Stand by assistance Dynamic Sitting Balance Dynamic Sitting - Balance Support: Feet supported Dynamic Sitting - Level of Assistance: 5: Stand by assistance;4: Min assist Static Standing Balance Static Standing - Balance Support: Right upper extremity supported Static Standing - Level of Assistance: 3: Mod assist;4: Min assist Dynamic Standing Balance Dynamic Standing - Balance Support: No upper extremity supported Dynamic Standing - Level of Assistance: 3: Mod assist Dynamic Standing - Balance Activities: Reaching for objects Dynamic Standing - Comments: wide based support. Extremity Assessment      RLE Assessment RLE Assessment: Within Functional Limits General Strength Comments: grossly at least 4/5 LLE Assessment LLE Assessment: Within Functional Limits General Strength Comments: grossly at least 4/5  Care Tool Care Tool Bed Mobility Roll left and right activity   Roll left  and right assist level: Supervision/Verbal cueing    Sit to lying activity        Lying to sitting on side of bed activity   Lying to sitting on side of bed assist level: the ability to move from lying on the back to sitting on the side of the bed with no back support.: Contact Guard/Touching assist     Care Tool Transfers Sit to stand transfer   Sit to stand assist level: Minimal Assistance - Patient > 75%    Chair/bed transfer   Chair/bed transfer assist level: Minimal Assistance - Patient > 75%    Car transfer   Car transfer assist level: Moderate Assistance - Patient 50 - 74%;Minimal Assistance - Patient > 75%      Care Tool Locomotion Ambulation   Assist level: Minimal Assistance - Patient > 75% Assistive device: Walker-hemi Max distance: 25  Walk 10 feet activity   Assist level: Minimal Assistance - Patient > 75% Assistive device: Walker-hemi   Walk 50 feet with 2 turns activity Walk 50 feet with 2 turns activity did not occur: Safety/medical concerns      Walk 150 feet activity Walk 150 feet activity did not occur: Safety/medical concerns      Walk 10 feet on uneven surfaces activity   Assist level: Moderate Assistance - Patient - 50 - 74% Assistive device: Walker-hemi  Stairs   Assist level: Moderate Assistance - Patient - 50 - 74% Stairs assistive device: 1 hand rail Max number of stairs: 4  Walk up/down 1 step activity   Walk up/down 1 step (curb) assist level: Moderate Assistance - Patient - 50 - 74% Walk up/down 1 step or curb assistive device: 1 hand rail  Walk up/down 4 steps activity   Walk up/down 4 steps assist level: Moderate Assistance - Patient - 50 - 74% Walk up/down 4 steps assistive device: 1 hand rail  Walk up/down 12 steps activity Walk up/down 12 steps activity did not occur: Safety/medical concerns      Pick up small objects from floor   Pick up small object from the floor assist  level: Moderate Assistance - Patient 50 - 74% Pick up  small object from the floor assistive device: no device.  Wheelchair Is the patient using a wheelchair?: Yes Type of Wheelchair: Manual   Wheelchair assist level: Supervision/Verbal cueing Max wheelchair distance: 200+  Wheel 50 feet with 2 turns activity   Assist Level: Supervision/Verbal cueing  Wheel 150 feet activity   Assist Level: Supervision/Verbal cueing    Refer to Care Plan for Long Term Goals  SHORT TERM GOAL WEEK 1 PT Short Term Goal 1 (Week 1): Pt will transfer sit to stand w/ CGA consistently safely. PT Short Term Goal 2 (Week 1): Pt will transfer w/c <> bed w/ CGA. PT Short Term Goal 3 (Week 1): Pt will amb w/ LRAD and CGA x 75'  Recommendations for other services: None   Skilled Therapeutic Intervention Evaluation completed (see details above and below) with education on PT POC and goals and individual treatment initiated with focus on  balance, strength, transfers, gait, safety in home.  Pt presents semi-reclined in bed and agreeable to therapy.  Pt transfers sidelying to sit from flat bed w/ supervision and increased time.  Pants threaded over feet and then sit to stand w/ mod A and cues.  Min A to complete donning of pants and then mod/min A and cues for SPT bed > w/c.  Pt negotiated w/c 200+ using LES and R UE supervision.  Pt performed car transfer w/ mod/min A, ramped surface and 4 steps w/ R railing.  Pt amb x 25' w/ HW and cues for proper placement and sequencing (HW on ground as moving LES) w/o noted ataxia, only fatigue.  Pt states on-off ataxia.  Pt remained sitting in w/c w/ chair alarm on and all needs in reach.   Mobility Bed Mobility Bed Mobility: Rolling Right;Right Sidelying to Sit Rolling Right: Supervision/verbal cueing Right Sidelying to Sit: Minimal Assistance - Patient > 75% Transfers Transfers: Sit to Stand;Stand to Sit;Stand Pivot Transfers Sit to Stand: Moderate Assistance - Patient 50-74%;Minimal Assistance - Patient > 75% Stand to Sit:  Minimal Assistance - Patient > 75% Stand Pivot Transfers: Moderate Assistance - Patient 50 - 74%;Minimal Assistance - Patient > 75% Stand Pivot Transfer Details: Verbal cues for precautions/safety;Verbal cues for sequencing Transfer (Assistive device): None Locomotion  Gait Ambulation: Yes Gait Assistance: Minimal Assistance - Patient > 75% Gait Distance (Feet): 25 Feet Assistive device: Hemi-walker Gait Assistance Details: Verbal cues for gait pattern;Verbal cues for precautions/safety;Verbal cues for safe use of DME/AE Gait Gait: Yes Gait Pattern: Step-to pattern;Wide base of support Gait velocity: reduced Stairs / Additional Locomotion Stairs: Yes Stairs Assistance: Moderate Assistance - Patient 50 - 74% Stair Management Technique: One rail Right Number of Stairs: 4 Height of Stairs: 6 Ramp: Moderate Assistance - Patient 50 - 74% Wheelchair Mobility Wheelchair Mobility: Yes Wheelchair Assistance: Supervision/Verbal cueing Wheelchair Propulsion: Right upper extremity;Both lower extermities Wheelchair Parts Management: Needs assistance Distance: 200+   Discharge Criteria: Patient will be discharged from PT if patient refuses treatment 3 consecutive times without medical reason, if treatment goals not met, if there is a change in medical status, if patient makes no progress towards goals or if patient is discharged from hospital.  The above assessment, treatment plan, treatment alternatives and goals were discussed and mutually agreed upon: by patient  Lucio Edward 12/26/2023, 1:03 PM

## 2023-12-26 NOTE — Patient Care Conference (Signed)
 Inpatient RehabilitationTeam Conference and Plan of Care Update Date: 12/26/2023   Time: 1131 am    Patient Name: Dustin Sampson      Medical Record Number: [id number]  Date of Birth: [birth year] Sex: Male         Room/Bed: 4W10U/7O53G-64 Payor Info: Payor: [employer] / Plan: [facility] / Product Type: *No Product type* /    Admit Date/Time:  12/25/2023  5:33 PM  Primary Diagnosis:  Subdural hematoma [facility])  Hospital Problems: Principal Problem:   Subdural hematoma (HCC)    Expected Discharge Date: Expected Discharge Date:  (pending evals)  Team Members Present: Physician leading conference: Dr. Genice Rouge Social Worker Present: Dossie Der, LCSW Nurse Present: Konrad Dolores, RN PT Present: Bernie Covey, PT OT Present: Velia Meyer, OT SLP Present: Pablo Lawrence, SLP PPS Coordinator present : Fae Pippin, SLP     Current Status/Progress Goal Weekly Team Focus  Bowel/Bladder   Continent of bowel and bladder   Maintain Continence   Encourage the use of PRN medication to facilitate regular bowel movement    Swallow/Nutrition/ Hydration   reg/ thin   sup A  implement compensatory strategies, possible MBSS?    ADL's   eval pending   eval pending   eval pending    Mobility   eval pending   eval pending  eval pending    Communication   mild dysarthria   sup A   implement strategies    Safety/Cognition/ Behavioral Observations  WNL            Pain   Patient complained of rib cage, hand and back pain   Keep pain controlled   Assess pain q shift and PRN, use of PRN medication and positioning    Skin   Abrasion and redness on the face   Maintain skin integrety  Assess skin q shift and PRN      Discharge Planning:  Home with wife and two grown children-all work but buddy will come by [employer] the day to make sure ok. Has DME-power chair is broken   Team Discussion: Patient admitted post fall with TBI. Patient has a left shoulder dislocation, and  nasal fracture. Patient limited by left rib pain, wheezing, oral thrush: medication adjusted by MD.  Patient on target to meet rehab goals: Evals pending  *See Care Plan and progress notes for long and short-term goals.   Revisions to Treatment Plan:  Sling x 2 weeks  ENT follow -up on nasal fracture  Teaching Needs: Safety, medications, toileting, transfers, etc.   Current Barriers to Discharge: Decreased caregiver support and Weight bearing restrictions  Possible Resolutions to Barriers: Family education     Medical Summary Current Status: dysarthria, TBI, L shoulder dislocation, continent B/B - pain 7/10-  Barriers to Discharge: Behavior/Mood;Weight bearing restrictions;Uncontrolled Pain;Self-care education;Medical stability;Other (comments);Complicated Wound  Barriers to Discharge Comments: multiple fx's- L shoulder dislocation NWB; dysarthria- thrush, constipation- Possible Resolutions to [employer] Focus: will determine next week because came and late hasn't been eval by PT /OT yet- TBD   Continued Need for Acute Rehabilitation Level of Care: The patient requires daily medical management by a physician with specialized training in physical medicine and rehabilitation for the following reasons: Direction of a multidisciplinary physical rehabilitation program to maximize functional independence : Yes Medical management of patient stability for increased activity during participation in an intensive rehabilitation regime.: Yes Analysis of laboratory values and/or radiology reports with any subsequent need for medication adjustment and/or medical intervention. : Yes  I attest that I was present, lead the team conference, and concur with the assessment and plan of the team.   Gwenyth Allegra 12/27/2023, 3:45 PM

## 2023-12-26 NOTE — Progress Notes (Signed)
 Inpatient Rehabilitation Admission Medication Review by a Pharmacist  A complete drug regimen review was completed for this patient to identify any potential clinically significant medication issues.  High Risk Drug Classes Is patient taking? Indication by Medication  Antipsychotic No   Anticoagulant No   Antibiotic Yes Keflex (through 3/5): nasal fracture Doxycycline: cough Fluconazole: Thrush  Opioid Yes Oxycodone: pain  Antiplatelet Yes Plavix (resume 3/4): stroke ppx, PAD  Hypoglycemics/insulin Yes Jardiance, SSI: HF, DM2  Vasoactive Medication Yes Toprol, Entresto, spironolactone: HF   Chemotherapy No   Other Yes Tylenol: pain Uloric: gout Duoneb: wheezing/SOB Ketoconazole cream: skin care Levothyroxine: hypothyroid Nystatin: skin care Miralax, Senokot, Fleet, Sorbitol, bisacodyl: constipation Temazepam: sleep Clonazepam: anxiety Benadryl: itching Melatonin: sleep Mylanta: indigestion Tessalon: cough Nicotine: nicotine dependence Compazine: nausea Pravastatin: HLD     Type of Medication Issue Identified Description of Issue Recommendation(s)  Drug Interaction(s) (clinically significant)     Duplicate Therapy     Allergy     No Medication Administration End Date     Incorrect Dose     Additional Drug Therapy Needed     Significant med changes from prior encounter (inform family/care partners about these prior to discharge).  Restart or discontinue as appropriate. Communicate medication changes with patient/family at discharge  Other       Clinically significant medication issues were identified that warrant physician communication and completion of prescribed/recommended actions by midnight of the next day:  No  Time spent performing this drug regimen review (minutes): 45  Thank you for allowing pharmacy to be a part of this patient's care.   Signe Colt, PharmD 12/26/2023 11:21 AM  **Pharmacist phone directory can be found on [email or web address] listed under  [facility]**

## 2023-12-26 NOTE — Progress Notes (Addendum)
 PROGRESS NOTE   Subjective/Complaints:  Pt reports pain 7/10- mainly face and LUE  Having BAD thrush- usually gets when has ABX of any kind.   LBM 3 days ago-    ROS:  Pt denies SOB, abd pain, CP, N/V (+)/C/D, and vision changes  Thrush- significant (+) Pain- 7/10 Constipation (+)   Objective:   DG Chest 1V REPEAT Same Day Result Date: 12/25/2023 CLINICAL DATA:  Dyspnea and cough. EXAM: CHEST - 1 VIEW SAME DAY COMPARISON:  12/19/2023 FINDINGS: Interval development of patchy airspace opacity in the left mid lung and left base as well as the right base, suspicious for multifocal pneumonia. No substantial pleural effusion. Cardiopericardial silhouette is at upper limits of normal for size. No acute bony abnormality. IMPRESSION: Interval development of patchy airspace opacity in the left mid lung and left base as well as the right base, suspicious for multifocal pneumonia. Electronically Signed   By: Kennith Center M.D.   On: 12/25/2023 10:05   Recent Labs    12/25/23 1212 12/26/23 0533  WBC 9.8 9.4  HGB 12.6* 12.1*  HCT 37.5* 36.2*  PLT 333 272   Recent Labs    12/25/23 1212 12/26/23 0533  NA 133* 134*  K 4.3 3.8  CL 96* 101  CO2 22 22  GLUCOSE 115* 121*  BUN 23* 29*  CREATININE 1.12 1.20  CALCIUM 9.6 9.2    Intake/Output Summary (Last 24 hours) at 12/26/2023 0851 Last data filed at 12/26/2023 0718 Gross per 24 hour  Intake 120 ml  Output 450 ml  Net -330 ml        Physical Exam: Vital Signs Blood pressure (!) 137/92, pulse (!) 102, temperature 98 F (36.7 C), resp. rate 18, weight 88.7 kg, SpO2 (!) 89%.    General: awake, alert, appropriate,  supine in bed; NAD HENT: conjugate gaze; oropharynx dry and covered in thrush- see when just normally opens mouth- nasal abrasions (+) CV: regular rhythm, mildly tachycardic rate; no JVD Pulmonary: slightly coarse but no wheezing GI: soft, NT, ND, (+)BS  hypoactive Psychiatric: appropriate Neurological: Ox3- dysarthric-  Neurologic: Cranial nerves II through XII intact, motor strength is 5/5 in RIght  deltoid, bicep, tricep, grip,5/5 right and 4/5 left  hip flexor, knee extensors, ankle dorsiflexor and plantar flexor Left grip 4/5 Sensory exam normal sensation to light touch in bilateral upper and lower extremities Cerebellar exam normal finger to nose to finger as well as heel to shin in right upper and lower extremities, LUE NT due to sling, LLE moderate ataxia with heel shin  Assessment/Plan: 1. Functional deficits which require 3+ hours per day of interdisciplinary therapy in a comprehensive inpatient rehab setting. Physiatrist is providing close team supervision and 24 hour management of active medical problems listed below. Physiatrist and rehab team continue to assess barriers to discharge/monitor patient progress toward functional and medical goals  Care Tool:  Bathing              Bathing assist       Upper Body Dressing/Undressing Upper body dressing        Upper body assist      Lower Body Dressing/Undressing Lower body dressing  Lower body assist       Toileting Toileting    Toileting assist       Transfers Chair/bed transfer  Transfers assist           Locomotion Ambulation   Ambulation assist              Walk 10 feet activity   Assist           Walk 50 feet activity   Assist           Walk 150 feet activity   Assist           Walk 10 feet on uneven surface  activity   Assist           Wheelchair     Assist               Wheelchair 50 feet with 2 turns activity    Assist            Wheelchair 150 feet activity     Assist          Blood pressure (!) 137/92, pulse (!) 102, temperature 98 F (36.7 C), resp. rate 18, weight 88.7 kg, SpO2 (!) 89%.  Medical Problem List and Plan: 1. Functional deficits  secondary to TBI             -patient may  shower             -ELOS/Goals: 14-18d Sup  Admit to CIR- first day of therapies- Con't CIR, PT , OT and SLP for cognition 2.  Antithrombotics: -DVT/anticoagulation:  Mechanical: Sequential compression devices, below knee Bilateral lower extremities             -antiplatelet therapy: N/A--Plavix to be held for a week per Dr. Jordan Likes --Resume on 03/04 3. Pain Management:  On IV hydromorphone and Oxycodone prn.              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 Mood/Behavior/Sleep: LCSW to follow for evaluation and support.              -antipsychotic agents: N/A   5. Neuropsych/cognition: This patient is capable of making decisions on his own behalf. 6. Skin/Wound Care: Routine pressure measures 7. Fluids/Electrolytes/Nutrition: Monitor I/O. Check CMET in am 8.  Left shoulder dislocation: NWB with sling X 2 weeks. Follow up with ortho in 2 weeks.  Dr Ave Filter did L RTC repair several years ago 9.  Nasal Fracture: Continue Keflex thru 03/05-->follow up with ENT in a week 10.  Pneumonia- Left chest wall pain/SOB: Wean oxygen. Encourage pulmonary hygiene  3/4- wasn't wearing this AM 11. Chronic HFrEF: Compensated. Daily weight and strict I/O             --ON Jardiance, Metoprolol, Pravastatin, Spironolactone, Entresto  3/4- ordered daily weights-will monitor for trend 12. Anxiety d/o: Continue Klonopin prn 13. Insomnia: On Restoril 30 mg/hs 14. Wheezing: Hx smoking plus not taking deep breaths due to left rib painFlutter valve qid for pulmonary hygiene. Will schedule  duo nebs qid for now.   15.  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 Ginette Pitman  3/4-  will give Diflucan 200 mg x1 and then 100 mg daily x 6 days- might need to increase dose if doesn't improve.  17. Constipation  3/4- will give Sorbitol 30 mg x1 to get him to have BM after therapy- has been 3 days.  18. Azotemia  3/4- will recheck labs Thursday since with HF, Garvis't want him to get too wet.   I spent a total of 53   minutes on total care today- >50% coordination of care- due to  D/w pt as well as nursing about Thrush and his constipation- and pain- also team conference to determine length of stay-      LOS: 1 days A FACE TO FACE EVALUATION WAS PERFORMED  Erice Ahles 12/26/2023, 8:51 AM

## 2023-12-26 NOTE — Progress Notes (Signed)
 Patient took sorbitol at 1520 with no result. RN offered suppository but patient refused. Patient states he feels like he could have a bowel movement soon. RN will follow up with patient and offer alternative if there is no result.

## 2023-12-26 NOTE — Progress Notes (Signed)
 Inpatient Rehabilitation Care Coordinator Assessment and Plan Patient Details  Name: Dustin Sampson MRN: [id number] Date of Birth: 1966-03-16  Today's Date: 12/26/2023  Hospital Problems: Principal Problem:   Subdural hematoma [facility])  Past Medical History:  Past Medical History:  Diagnosis Date   Anxiety    Arthritis    Diabetes mellitus without complication (HCC)    Gout    Hyperlipemia    Hypertension    Hypothyroidism    Insomnia    Pneumonia    Stroke [facility])    Thyroid disease    Past Surgical History:  Past Surgical History:  Procedure Laterality Date   ABDOMINAL AORTOGRAM W/LOWER EXTREMITY Bilateral 08/21/2020   Procedure: ABDOMINAL AORTOGRAM W/LOWER EXTREMITY;  Surgeon: Sherren Kerns, MD;  Location: [facility];  Service: Cardiovascular;  Laterality: Bilateral;   ABDOMINAL AORTOGRAM W/LOWER EXTREMITY N/A 09/04/2020   Procedure: ABDOMINAL AORTOGRAM W/LOWER EXTREMITY;  Surgeon: Sherren Kerns, MD;  Location: [facility];  Service: Cardiovascular;  Laterality: N/A;   COLONOSCOPY     x2   FEMORAL ARTERY EXPLORATION Left 09/04/2020   Procedure: FEMORAL ARTERY EXPLORATION;  Surgeon: Chuck Hint, MD;  Location: [facility] OR;  Service: Vascular;  Laterality: Left;   NASAL SINUS SURGERY     PERIPHERAL VASCULAR INTERVENTION Left 08/21/2020   Procedure: PERIPHERAL VASCULAR INTERVENTION;  Surgeon: Sherren Kerns, MD;  Location: [facility];  Service: Cardiovascular;  Laterality: Left;  sfa stent x 3   PERIPHERAL VASCULAR INTERVENTION  09/04/2020   Procedure: PERIPHERAL VASCULAR INTERVENTION;  Surgeon: Sherren Kerns, MD;  Location: [facility];  Service: Cardiovascular;;  Right SFA   RIGHT/LEFT HEART CATH AND CORONARY ANGIOGRAPHY N/A 11/16/2023   Procedure: RIGHT/LEFT HEART CATH AND CORONARY ANGIOGRAPHY;  Surgeon: Yates Decamp, MD;  Location: [facility];  Service: Cardiovascular;  Laterality: N/A;   SHOULDER ARTHROSCOPY WITH ROTATOR  CUFF REPAIR AND SUBACROMIAL DECOMPRESSION Left 02/04/2021   Procedure: SHOULDER ARTHROSCOPY WITH ROTATOR CUFF REPAIR AND SUBACROMIAL DECOMPRESSION;  Surgeon: Jones Broom, MD;  Location: [facility] ORS;  Service: Orthopedics;  Laterality: Left;   WISDOM TOOTH EXTRACTION     WRIST SURGERY     bilat   Social History:  reports that he has been smoking cigarettes. He has never been exposed to tobacco smoke. He has quit using smokeless tobacco. He reports that he does not currently use alcohol. He reports that he does not use drugs.  Family / Support Systems Marital Status: Married Patient Roles: Spouse, Parent, Other (Comment) (retiree) Spouse/Significant Other: Selena Batten [phone] Children: Two grwon children live with them and work Other Supports: Friends Anticipated Caregiver: Kim Ability/Limitations of Caregiver: Wife works and kids are hit or miss-according to pt Has buddy who will come by daily Caregiver Availability: 24/7 (short time) Family Dynamics: Close with family and freinds he has a good buddy who will be checking and making sure he has what he needs.  Social History Preferred language: English Religion: Catholic Cultural Background: NA Education: HS Health Literacy - How often do you need to have someone help you when you read instructions, pamphlets, or other written material from your doctor or pharmacy?: Never Writes: Yes Employment Status: Disabled Date Retired/Disabled/Unemployed: 2018 Marine scientist Issues: NA Guardian/Conservator: None-accordng to MD pt is capable of making his own decisions while here   Abuse/Neglect Abuse/Neglect Assessment Can Be Completed: Yes Physical Abuse: Denies Verbal Abuse: Denies Sexual Abuse: Denies Exploitation of patient/patient's resources: Denies Self-Neglect: Denies  Patient response to:  Social Isolation - How often do you feel lonely or isolated from those around you?: Rarely  Emotional Status Pt's affect, behavior and  adjustment status: Pt is hoping to do well here. He reports his ataxia comes and goes there are days he is good and others he is not. He got this from his Dad and MD's can not find the cause of it. Recent Psychosocial Issues: ataxia-multiple falls as a result of this, hx-cancer but did not see in chart Psychiatric History: Hx-anxirty-depression takes medications for tis and finds it helpful. Would benefit from seeing neuro-pyshc while here Substance Abuse History: Tobacco aware of health risks and knows of resources  Patient / Family Perceptions, Expectations & Goals Pt/Family understanding of illness & functional limitations: Pt can explainhis fall and his history of ataxia, he tries to do well with it but at times falls like his Dad would do. MD's still trying to figure out cause and have done genetic tests. Premorbid pt/family roles/activities: huband, father, friend, neighbor, etc Anticipated changes in roles/activities/participation: resume Pt/family expectations/goals: Pt states: " I hope to get more stable here and leanr how to manage with my arm in a sling."  [employer]: None Premorbid Home Care/DME Agencies: Other (Comment) (has pwer chair-broken, transport chair, rw) Transportation available at discharge: wife and buddy Is the patient able to respond to transportation needs?: Yes In the past 12 months, has lack of transportation kept you from medical appointments or from getting medications?: No In the past 12 months, has lack of transportation kept you from meetings, work, or from getting things needed for daily living?: No Resource referrals recommended: Neuropsychology  Discharge Planning Living Arrangements: Spouse/significant other, Children Support Systems: Spouse/significant other, Children, Other relatives, Friends/neighbors Type of Residence: Private residence Insurance Resources: Media planner (specify) Haematologist health) Financial  Resources: SSD, Family Support Financial Screen Referred: No Living Expenses: Banker Management: Patient, Spouse Does the patient have any problems obtaining your medications?: No Home Management: wife and pt when having a good day Patient/Family Preliminary Plans: Return home with wife and two children. Wife and kids work and will be there in the evenings. His buddy doesn't work and will come by to make sure ok during the day. Aware goals being set today for stay Care Coordinator Barriers to Discharge: Insurance for SNF coverage, Other (comments) Care Coordinator Anticipated Follow Up Needs: HH/OP  Clinical Impression Pleasant gentleman who is motivated to do well and learn to compensate with his ataxia. He has been falling at home prior to admission. He uses a transport chair and rolling walker. His power chair is broken. Aware team conference and setting goals for stay here.  Lucy Chris 12/26/2023, 8:52 AM

## 2023-12-26 NOTE — Evaluation (Signed)
 Occupational Therapy Assessment and Plan  Patient Details  Name: Dustin Sampson MRN: [id number] Date of Birth: [birth year]  OT Diagnosis: ataxia and muscle weakness (generalized) Rehab Potential: Rehab Potential (ACUTE ONLY): Good ELOS: 12-14 days   Today's Date: 12/26/2023 OT Individual Time: 4098-1191 OT Individual Time Calculation (min): 78 min     Hospital Problem: Principal Problem:   Subdural hematoma (HCC)   Past Medical History:  Past Medical History:  Diagnosis Date   Anxiety    Arthritis    Diabetes mellitus without complication (HCC)    Gout    Hyperlipemia    Hypertension    Hypothyroidism    Insomnia    Pneumonia    Stroke [facility])    Thyroid disease    Past Surgical History:  Past Surgical History:  Procedure Laterality Date   ABDOMINAL AORTOGRAM W/LOWER EXTREMITY Bilateral 08/21/2020   Procedure: ABDOMINAL AORTOGRAM W/LOWER EXTREMITY;  Surgeon: Sherren Kerns, MD;  Location: [facility];  Service: Cardiovascular;  Laterality: Bilateral;   ABDOMINAL AORTOGRAM W/LOWER EXTREMITY N/A 09/04/2020   Procedure: ABDOMINAL AORTOGRAM W/LOWER EXTREMITY;  Surgeon: Sherren Kerns, MD;  Location: [facility];  Service: Cardiovascular;  Laterality: N/A;   COLONOSCOPY     x2   FEMORAL ARTERY EXPLORATION Left 09/04/2020   Procedure: FEMORAL ARTERY EXPLORATION;  Surgeon: Chuck Hint, MD;  Location: [facility] OR;  Service: Vascular;  Laterality: Left;   NASAL SINUS SURGERY     PERIPHERAL VASCULAR INTERVENTION Left 08/21/2020   Procedure: PERIPHERAL VASCULAR INTERVENTION;  Surgeon: Sherren Kerns, MD;  Location: [facility];  Service: Cardiovascular;  Laterality: Left;  sfa stent x 3   PERIPHERAL VASCULAR INTERVENTION  09/04/2020   Procedure: PERIPHERAL VASCULAR INTERVENTION;  Surgeon: Sherren Kerns, MD;  Location: [facility];  Service: Cardiovascular;;  Right SFA   RIGHT/LEFT HEART CATH AND CORONARY ANGIOGRAPHY N/A 11/16/2023   Procedure:  RIGHT/LEFT HEART CATH AND CORONARY ANGIOGRAPHY;  Surgeon: Yates Decamp, MD;  Location: [facility];  Service: Cardiovascular;  Laterality: N/A;   SHOULDER ARTHROSCOPY WITH ROTATOR CUFF REPAIR AND SUBACROMIAL DECOMPRESSION Left 02/04/2021   Procedure: SHOULDER ARTHROSCOPY WITH ROTATOR CUFF REPAIR AND SUBACROMIAL DECOMPRESSION;  Surgeon: Jones Broom, MD;  Location: [facility] ORS;  Service: Orthopedics;  Laterality: Left;   WISDOM TOOTH EXTRACTION     WRIST SURGERY     bilat    Assessment & Plan Clinical Impression:   HPI:  Dustin Sampson is a 58 year old  male with history of CVA with ataxia, HFrEF, insomnia, anxiety d/o, PAD, frequent falls who was admitted on 12/19/23 after tripping and falling onto and complaints of nose and right shoulder pain with epistaxis. He was not using walker when he fell and noted to have shoulder deformity at admission. Work up revealed small area of SDH along anterior falx and minimally displaced nasal Fx on left. Dr.Pool recommended repeat CT for monitoring and if stable OK for d/c as well as holding Plavix for 7 days. Nasal laceration repaired and patient started on Keflex per discussion with Dr. Jearld Fenton who recommends follow up in one week. Left shoulder dislocation reduced and to be NWB with ortho follow up in 2 weeks.    He developed SOB requiring oxygen on 02/27 with reports of left chest pain as well as cough. Has also had tachycardia felt to be pain mediated. CXR was negative. PT/OT consulted and patient noted to require min to mod assist due to wide based staggering  gait as well as anxiety. Marland Kitchen CIR recommended due to functional decline.  .  Patient transferred to CIR on 12/25/2023 .    Patient currently requires max with basic self-care skills secondary to muscle weakness and muscle joint tightness, decreased cardiorespiratoy endurance, impaired timing and sequencing, abnormal tone, unbalanced muscle activation, ataxia, and decreased coordination, and decreased sitting  balance, decreased standing balance, decreased postural control, and decreased balance strategies.  Prior to hospitalization, patient could complete ADLs with modified independent .  Patient will benefit from skilled intervention to decrease level of assist with basic self-care skills and increase independence with basic self-care skills prior to discharge home with care partner.  Anticipate patient will require 24 hour supervision and follow up home health.  OT - End of Session Activity Tolerance: Tolerates 30+ min activity with multiple rests Endurance Deficit: Yes OT Assessment Rehab Potential (ACUTE ONLY): Good OT Barriers to Discharge: Decreased caregiver support OT Barriers to Discharge Comments: family works during day OT Patient demonstrates impairments in the following area(s): Balance;Safety;Skin Integrity;Endurance;Motor;Pain OT Basic ADL's Functional Problem(s): Grooming;Bathing;Dressing;Toileting OT Transfers Functional Problem(s): Toilet;Tub/Shower OT Additional Impairment(s): None OT Plan OT Intensity: Minimum of 1-2 x/day, 45 to 90 minutes OT Frequency: 5 out of 7 days OT Duration/Estimated Length of Stay: 12-14 days OT Treatment/Interventions: Balance/vestibular training;Discharge planning;Pain management;Self Care/advanced ADL retraining;Therapeutic Activities;UE/LE Coordination activities;Disease mangement/prevention;Functional mobility training;Cognitive remediation/compensation;Patient/family education;Skin care/wound managment;Therapeutic Exercise;Visual/perceptual remediation/compensation;Community reintegration;DME/adaptive equipment instruction;Neuromuscular re-education;Psychosocial support;Splinting/orthotics;UE/LE Strength taining/ROM;Wheelchair propulsion/positioning OT Self Feeding Anticipated Outcome(s): SBA OT Basic Self-Care Anticipated Outcome(s): SBA OT Toileting Anticipated Outcome(s): SBA OT Bathroom Transfers Anticipated Outcome(s): SBA OT  Recommendation Patient destination: Home Follow Up Recommendations: Home health OT Equipment Recommended: 3 in 1 bedside comode;To be determined Equipment Details: owns light weight PWC that is broken, shower seat, RW   OT Evaluation Precautions/Restrictions  Precautions Precautions: Fall Shoulder Interventions: Shoulder sling/immobilizer Precaution/Restrictions Comments: ataxia Required Braces or Orthoses: Sling (Lt shoulder) Restrictions Weight Bearing Restrictions Per Provider Order: Yes LUE Weight Bearing Per Provider Order: Non weight bearing Other Position/Activity Restrictions: sling for 2 weeks until ortho follow-up General Chart Reviewed: Yes Response to Previous Treatment: Not applicable Family/Caregiver Present: No Pain Pain Assessment Pain Scale: 0-10 Pain Score: 2  Pain Location: Rib cage Pain Orientation: Left Home Living/Prior Functioning Home Living Family/patient expects to be discharged to:: Private residence Living Arrangements: Spouse/significant other, Children Available Help at Discharge: Family, Friend(s), Available PRN/intermittently (wife home after 3pm, kids in and out, friend also available if needed) Type of Home: House Home Access: Stairs to enter [employer] of Steps: 3 from garage w/o rails, 4 STE from secondary entrance w/ B rails. Entrance Stairs-Rails: None Home Layout: One level Bathroom Shower/Tub: Walk-in shower, Other (comment) (has zero entry or one with threshhold and owns shower seat with back) Bathroom Toilet: Handicapped height Bathroom Accessibility: Yes Additional Comments: Pt's power w/c is broken. Pt uses RW around home, transport chair and self purchased PWC for community  Lives With: Spouse, Family IADL History Homemaking Responsibilities: No Current License: Yes Mode of Transportation: Car Occupation: On disability Leisure and Hobbies: sport, plays guitar Prior Function Level of Independence: Independent  with transfers, Independent with gait, Requires assistive device for independence, Needs assistance with homemaking (used RW for mobility throughout house, used PWC out in Scientist, clinical (histocompatibility and immunogenetics))  Able to Take Stairs?: Yes Driving: Yes Vocation: On disability Leisure: Hobbies-yes (Comment) (sports and music) Vision Baseline Vision/History: 1 Wears glasses (for distance) Ability to See in Adequate Light: 0 Adequate Patient Visual Report: No  change from baseline Vision Assessment?: No apparent visual deficits Perception  Perception: Within Functional Limits Praxis Praxis: WFL Cognition Cognition Overall Cognitive Status: Within Functional Limits for tasks assessed Arousal/Alertness: Awake/alert Orientation Level: Person;Place;Situation Person: Oriented Place: Oriented Situation: Oriented Memory: Appears intact Attention: Focused Focused Attention: Appears intact Awareness: Appears intact Problem Solving: Appears intact Executive Function: Reasoning Reasoning: Appears intact Safety/Judgment: Appears intact Brief Interview for Mental Status (BIMS) Repetition of Three Words (First Attempt): 3 Temporal Orientation: Year: Correct Temporal Orientation: Month: Accurate within 5 days Temporal Orientation: Day: Correct Recall: "Sock": Yes, no cue required Recall: "Blue": Yes, no cue required Recall: "Bed": Yes, after cueing ("a piece of furniture") BIMS Summary Score: 14 Sensation Sensation Light Touch: Appears Intact (has baseline neuropathy in feet) Hot/Cold: Appears Intact Proprioception: Appears Intact Stereognosis: Not tested Coordination Gross Motor Movements are Fluid and Coordinated: No Fine Motor Movements are Fluid and Coordinated: No Coordination and Movement Description: ataxia at baseline Motor  Motor Motor: Ataxia Motor - Skilled Clinical Observations: ataxic during transfers, not during functional reaching  Trunk/Postural Assessment  Cervical  Assessment Cervical Assessment: Exceptions to [facility] (forward head) Thoracic Assessment Thoracic Assessment: Exceptions to [facility] (rounded shoulders) Lumbar Assessment Lumbar Assessment: Exceptions to [facility] (posterior pelvic tilt) Postural Control Postural Control: Deficits on evaluation Protective Responses: delayed significantly.  Balance Balance Balance Assessed: Yes Dynamic Sitting Balance Dynamic Sitting - Balance Support: Feet supported;Right upper extremity supported Dynamic Sitting - Level of Assistance: 4: Min assist (CGA) Sitting balance - Comments: EOB safely but reports there are times where he leans posteriorly Dynamic Standing Balance Dynamic Standing - Balance Support: Right upper extremity supported;During functional activity Dynamic Standing - Level of Assistance: 3: Mod assist Dynamic Standing - Balance Activities: Reaching for objects Extremity/Trunk Assessment RUE Assessment RUE Assessment: Within Functional Limits LUE Assessment LUE Assessment: Exceptions to [facility] Active Range of Motion (AROM) Comments: limited ROM from arm sling in elbow and shoulder, wrist and fingers [facility]  Care Tool Care Tool Self Care Eating   Eating Assist Level: Set up assist    Oral Care    Oral Care Assist Level: Set up assist    Bathing         Assist Level: Maximal Assistance - Patient 24 - 49%    Upper Body Dressing(including orthotics)   What is the patient wearing?: Pull over shirt   Assist Level: Maximal Assistance - Patient 25 - 49%    Lower Body Dressing (excluding footwear)     Assist for lower body dressing: Maximal Assistance - Patient 25 - 49%    Putting on/Taking off footwear     Assist for footwear: Total Assistance - Patient < 25%       Care Tool Toileting Toileting activity   Assist for toileting: Maximal Assistance - Patient 25 - 49%     Care Tool Bed Mobility Roll left and right activity   Roll left and right assist level: Supervision/Verbal cueing     Sit to lying activity   Sit to lying assist level: Minimal Assistance - Patient > 75%    Lying to sitting on side of bed activity   Lying to sitting on side of bed assist level: the ability to move from lying on the back to sitting on the side of the bed with no back support.: Contact Guard/Touching assist     Care Tool Transfers Sit to stand transfer   Sit to stand assist level: Minimal Assistance - Patient > 75%    Chair/bed transfer  Chair/bed transfer assist level: Moderate Assistance - Patient 50 - 74%     Toilet transfer   Assist Level: Moderate Assistance - Patient 50 - 74%     Care Tool Cognition  Expression of Ideas and Wants Expression of Ideas and Wants: 4. Without difficulty (complex and basic) - expresses complex messages without difficulty and with speech that is clear and easy to understand  Understanding Verbal and Non-Verbal Content Understanding Verbal and Non-Verbal Content: 4. Understands (complex and basic) - clear comprehension without cues or repetitions   Memory/Recall Ability Memory/Recall Ability : Current season;That he or she is in a hospital/hospital unit   Refer to Care Plan for Long Term Goals  SHORT TERM GOAL WEEK 1 OT Short Term Goal 1 (Week 1): Pt will complete static standing activity with Min A with LRAD and no LOB OT Short Term Goal 2 (Week 1): Pt will complete LB dressing with Mod A with AE as necessary OT Short Term Goal 3 (Week 1): Pt will complete toileting at Mod A with LRAD  Recommendations for other services: Therapeutic Recreation  Pet therapy   Skilled Therapeutic Intervention ADL ADL Eating: Set up Grooming: Setup Where Assessed-Grooming: Sitting at sink Upper Body Bathing: Maximal assistance Lower Body Bathing: Maximal assistance Upper Body Dressing: Maximal assistance Lower Body Dressing: Moderate assistance;Maximal assistance Toileting: Maximal assistance Where Assessed-Toileting: Bedside Commode;Public house manager: Moderate assistance Toilet Transfer Method: Stand pivot Acupuncturist: Gaffer: Unable to assess Tub/Shower Transfer Method: Unable to assess Film/video editor: Unable to assess [employer] Method: Unable to assess ADL Comments: Pt with increased ataxia in OT eval compared to earlier PT eval, could be from fatigue d/t pt reporting increased fatigue. Pt with shoulder immobilizing sling on LUE, OT ordered new one for bathing to keep LUE stabilized during bathing. Mobility  Bed Mobility Bed Mobility: Rolling Right;Right Sidelying to Sit;Supine to Sit;Sit to Supine Rolling Right: Supervision/verbal cueing Right Sidelying to Sit: Minimal Assistance - Patient > 75% Supine to Sit: Supervision/Verbal cueing Sit to Supine: Minimal Assistance - Patient > 75% Transfers Sit to Stand: Minimal Assistance - Patient > 75% Stand to Sit: Minimal Assistance - Patient > 75%  1:1 evaluation and treatment session initiated this date. OT roles, goals and purpose discussed with pt as well as therapy schedule. ADL completed this date with levels of assist listed above. Pt with increased ataxia in PM session compared to AM session with PT. Pt reporting ataxia varies throughout the day at baseline. Pt able to complete ADLs at baseline with extra time. Pt would benefit from skilled OT in IPR setting in order to maximize independence with ADLs upon D/C.    Discharge Criteria: Patient will be discharged from OT if patient refuses treatment 3 consecutive times without medical reason, if treatment goals not met, if there is a change in medical status, if patient makes no progress towards goals or if patient is discharged from hospital.  The above assessment, treatment plan, treatment alternatives and goals were discussed and mutually agreed upon: by patient  Velia Meyer, OTD, OTR/L 12/26/2023, 4:26 PM

## 2023-12-26 NOTE — Progress Notes (Signed)
 Inpatient Rehabilitation  Patient information reviewed and entered into eRehab system by Cheri Rous, OTR/L, Rehab Quality Coordinator.   Information including medical coding, functional ability and quality indicators will be reviewed and updated through discharge.

## 2023-12-26 NOTE — Plan of Care (Signed)
  Problem: RH Expression Communication Goal: LTG Patient will increase speech intelligibility (SLP) Description: LTG: Patient will increase speech intelligibility at word/phrase/conversation level with cues, % of the time (SLP) 12/26/2023 1616 by [name], CCC-SLP Flowsheets (Taken 12/26/2023 1616) Percent of time patient will use intelligible speech: 90 12/26/2023 1616 by [name], CCC-SLP Flowsheets (Taken 12/26/2023 1616) LTG: Patient will increase speech intelligibility (SLP): Supervision Level: Conversation level

## 2023-12-27 DIAGNOSIS — S065XAA Traumatic subdural hemorrhage with loss of consciousness status unknown, initial encounter: Secondary | ICD-10-CM | POA: Diagnosis not present

## 2023-12-27 LAB — GLUCOSE, CAPILLARY
Glucose-Capillary: 132 mg/dL — ABNORMAL HIGH (ref 70–99)
Glucose-Capillary: 126 mg/dL — ABNORMAL HIGH (ref 70–99)
Glucose-Capillary: 122 mg/dL — ABNORMAL HIGH (ref 70–99)

## 2023-12-27 MED ORDER — OXYCODONE-ACETAMINOPHEN 5-325 MG PO TABS
1.0000 | ORAL_TABLET | ORAL | Status: DC | PRN
  Administered 2023-12-27 – 2023-12-30 (×6): 2 via ORAL
  Administered 2023-12-30 – 2023-12-31 (×3): 1 via ORAL
  Administered 2023-12-31 – 2024-01-04 (×6): 2 via ORAL
  Filled 2023-12-27 (×14): qty 2

## 2023-12-27 MED ORDER — IPRATROPIUM-ALBUTEROL 0.5-2.5 (3) MG/3ML IN SOLN: 3.0000 mL | RESPIRATORY_TRACT | Status: DC | PRN

## 2023-12-27 MED ORDER — MAGIC MOUTHWASH W/LIDOCAINE
10.0000 mL | Freq: Four times a day (QID) | ORAL | Status: AC
  Administered 2023-12-27 – 2024-01-02 (×28): 10 mL via ORAL
  Filled 2023-12-27 (×28): qty 10

## 2023-12-27 NOTE — Plan of Care (Signed)
  Problem: RH Swallowing Goal: LTG Patient will consume least restrictive diet using compensatory strategies with assistance (SLP) Description: LTG:  Patient will consume least restrictive diet using compensatory strategies with assistance (SLP) Flowsheets (Taken 12/27/2023 1142) LTG: Pt Patient will consume least restrictive diet using compensatory strategies with assistance of (SLP): Supervision

## 2023-12-27 NOTE — Progress Notes (Signed)
 Occupational Therapy Session Note  Patient Details  Name: Dustin Sampson MRN: [id number] Date of Birth: May 03, 1966  Today's Date: 12/27/2023 OT Individual Time: 0800-0900 OT Individual Time Calculation (min): 60 min    Short Term Goals: Week 1:  OT Short Term Goal 1 (Week 1): Pt will complete static standing activity with Min A with LRAD and no LOB OT Short Term Goal 2 (Week 1): Pt will complete LB dressing with Mod A with AE as necessary OT Short Term Goal 3 (Week 1): Pt will complete toileting at Mod A with LRAD  Skilled Therapeutic Interventions/Progress Updates:      Therapy Documentation Precautions:  Precautions Precautions: Fall Shoulder Interventions: Shoulder sling/immobilizer Precaution/Restrictions Comments: ataxia Required Braces or Orthoses: Sling (Lt shoulder) Restrictions Weight Bearing Restrictions Per Provider Order: Yes LUE Weight Bearing Per Provider Order: Non weight bearing Other Position/Activity Restrictions: sling for 2 weeks until ortho follow-up General: "I feel so much better!" Pt supine in bed upon OT arrival, agreeable to OT session.  Pain:  unrated pain reported in shoulder, shower, activity, intermittent rest breaks, distractions provided for pain management, pt reports tolerable to proceed.   ADL: Pt completed full ADL this AM in order to increase independence with tasks and increase functional endurance. Pt completed the following tasks with current levels of assist: Bed mobility: SBA with HOB raised and use of bed rail UB dressing: Min A, assistance required for donning/doffing/adjusting sling and hand on LUE LB dressing: Min A for pants management in standing, OT assisting with stability while pt completing pants management with RUE  Footwear: total A for donning/doffing socks Shower transfer: Min A with hemi cane, ambulating from bed><TTB  Bathing: Min A for posterior hygiene, seated for shower, arm sling donned for bathing   Pt supine in bed  with bed alarm activated, 2 bed rails up, call light within reach and 4Ps assessed.   Therapy/Group: Individual Therapy  Velia Meyer, OTD, OTR/L 12/27/2023, 4:27 PM

## 2023-12-27 NOTE — Progress Notes (Signed)
 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 SLP Short Term Goal 2 (Week 1): Patient will recall 4 compensatory speech intelligibility strategeis with 100% acc with sup A. SLP Short Term Goal 3 (Week 1): Patient wlll consume thin liquids without s/sx pen/asp in 80% opportunites with sup A SLP Short Term Goal 4 (Week 1): Patient will recall safe swallwoing strategies with 100% acc and sup A SLP Short Term Goal 5 (Week 1): Pt will complete MBSS in order to r/o aspiration.  Skilled Therapeutic Interventions:  Patient was seen in am to address speech intelligibility. Pt alert and seated upright in bed upon SLP arrival. He reported having received a shower prior to SLP arrival and appeared slightly out of breath however, resolved with time. He was initially 100% intelligible. SLP introduced SLOP and A BOSS speech intelligibility strategies and examples of utilization. Pt able to add insight to education through speaking on prior experiences. Additional education provided on importance of positioning on speech intelligibility with pt verbalized understanding. SLP completed BSS during evaluation and noted that speech intelligibility began to fluctuate across session with instances of imprecise articulation and diminished breath support. Pt responsive to cues for vocal rest and over articulation. Pt's speech intelligibility decreased to 75% and improved to 90% with cues for over articulation. Pt left with external visual aid of strategies left in his room. SLP to continue POC.  Pain Pain Assessment Pain Scale: 0-10 Pain Score: 5  Pain Type: Acute pain Pain  Location: Rib cage Pain Orientation: Left Pain Descriptors / Indicators: Aching  Therapy/Group: Individual Therapy  [name] 12/27/2023, 11:42 AM
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
## 2023-12-27 NOTE — Plan of Care (Signed)
  Problem: RH BOWEL ELIMINATION Goal: RH STG MANAGE BOWEL WITH ASSISTANCE Description: STG Manage Bowel with  supervision Assistance. Outcome: Progressing   Problem: RH BLADDER ELIMINATION Goal: RH STG MANAGE BLADDER WITH ASSISTANCE Description: STG Manage Bladder With supervision  Assistance Outcome: Progressing   Problem: RH SKIN INTEGRITY Goal: RH STG SKIN FREE OF INFECTION/BREAKDOWN Description:  Manage skin free of infection/ breakdown  integrity with supervision   Outcome: Progressing   Problem: RH SAFETY Goal: RH STG ADHERE TO SAFETY PRECAUTIONS W/ASSISTANCE/DEVICE Description: STG Adhere to Safety Precautions With supervision Assistance/Device. Outcome: Progressing   Problem: RH PAIN MANAGEMENT Goal: RH STG PAIN MANAGED AT OR BELOW PT'S PAIN GOAL Description: <4 w/ prns Outcome: Progressing

## 2023-12-27 NOTE — Progress Notes (Signed)
 PROGRESS NOTE   Subjective/Complaints:  Pt reports LBM yesterday- had good BM.   Thrush about the same so far- willing to start magic mouthwash- had before- helpful Pain tolerable, but needs pain meds this AM ROS:  Pt denies SOB, abd pain, CP, N/V (+)/C/D, and vision changes  Thrush- significant (+) Pain- still an issue Constipation resolved   Objective:   No results found.  Recent Labs    12/25/23 1212 12/26/23 0533  WBC 9.8 9.4  HGB 12.6* 12.1*  HCT 37.5* 36.2*  PLT 333 272   Recent Labs    12/25/23 1212 12/26/23 0533  NA 133* 134*  K 4.3 3.8  CL 96* 101  CO2 22 22  GLUCOSE 115* 121*  BUN 23* 29*  CREATININE 1.12 1.20  CALCIUM 9.6 9.2    Intake/Output Summary (Last 24 hours) at 12/27/2023 0806 Last data filed at 12/26/2023 1900 Gross per 24 hour  Intake 520 ml  Output 750 ml  Net -230 ml        Physical Exam: Vital Signs Blood pressure 94/81, pulse (!) 53, temperature 98 F (36.7 C), resp. rate 16, height 5\' 9"  (1.753 m), weight 88.7 kg, SpO2 97%.     General: awake, alert, appropriate,sitting up in bed;  NAD HENT: conjugate gaze; oropharynx moist- thrush still evident- nasal abrasions CV: regular rhythm, bradycardic rate, No JVD Pulmonary: CTA B/L; no W/R/R- good air movement GI: soft, NT, ND, (+)BS Psychiatric: appropriate- more interactive Neurological: Ox3 Less dysarthric this AM-  Neurologic: Cranial nerves II through XII intact, motor strength is 5/5 in RIght  deltoid, bicep, tricep, grip,5/5 right and 4/5 left  hip flexor, knee extensors, ankle dorsiflexor and plantar flexor Left grip 4/5 Sensory exam normal sensation to light touch in bilateral upper and lower extremities Cerebellar exam normal finger to nose to finger as well as heel to shin in right upper and lower extremities, LUE NT due to sling, LLE moderate ataxia with heel shin  Assessment/Plan: 1. Functional deficits  which require 3+ hours per day of interdisciplinary therapy in a comprehensive inpatient rehab setting. Physiatrist is providing close team supervision and 24 hour management of active medical problems listed below. Physiatrist and rehab team continue to assess barriers to discharge/monitor patient progress toward functional and medical goals  Care Tool:  Bathing              Bathing assist Assist Level: Maximal Assistance - Patient 24 - 49%     Upper Body Dressing/Undressing Upper body dressing   What is the patient wearing?: Pull over shirt    Upper body assist Assist Level: Maximal Assistance - Patient 25 - 49%    Lower Body Dressing/Undressing Lower body dressing            Lower body assist Assist for lower body dressing: Maximal Assistance - Patient 25 - 49%     Toileting Toileting    Toileting assist Assist for toileting: Maximal Assistance - Patient 25 - 49%     Transfers Chair/bed transfer  Transfers assist     Chair/bed transfer assist level: Moderate Assistance - Patient 50 - 74%     Locomotion Ambulation  Ambulation assist      Assist level: Minimal Assistance - Patient > 75% Assistive device: Walker-hemi Max distance: 25   Walk 10 feet activity   Assist     Assist level: Minimal Assistance - Patient > 75% Assistive device: Walker-hemi   Walk 50 feet activity   Assist Walk 50 feet with 2 turns activity did not occur: Safety/medical concerns         Walk 150 feet activity   Assist Walk 150 feet activity did not occur: Safety/medical concerns         Walk 10 feet on uneven surface  activity   Assist     Assist level: Moderate Assistance - Patient - 50 - 74% Assistive device: Education administrator Is the patient using a wheelchair?: Yes Type of Wheelchair: Manual    Wheelchair assist level: Supervision/Verbal cueing Max wheelchair distance: 200+    Wheelchair 50 feet with 2 turns  activity    Assist        Assist Level: Supervision/Verbal cueing   Wheelchair 150 feet activity     Assist      Assist Level: Supervision/Verbal cueing   Blood pressure 94/81, pulse (!) 53, temperature 98 F (36.7 C), resp. rate 16, height 5\' 9"  (1.753 m), weight 88.7 kg, SpO2 97%.  Medical Problem List and Plan: 1. Functional deficits secondary to TBI             -patient may  shower             -ELOS/Goals: 14-18d Sup  D/c TBD  Con't CIR PT and OT and SLP for swallowing 2.  Antithrombotics: -DVT/anticoagulation:  Mechanical: Sequential compression devices, below knee Bilateral lower extremities             -antiplatelet therapy: N/A--Plavix to be held for a week per Dr. Jordan Likes --Resume on 03/04 3. Pain Management:  On IV hydromorphone and Oxycodone prn.              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 Mood/Behavior/Sleep: LCSW to follow for evaluation and support.              -antipsychotic agents: N/A   5. Neuropsych/cognition: This patient is capable of making decisions on his own behalf. 6. Skin/Wound Care: Routine pressure measures 7. Fluids/Electrolytes/Nutrition: Monitor I/O. Check CMET in am 8.  Left shoulder dislocation: NWB with sling X 2 weeks. Follow up with ortho in 2 weeks.  Dr Ave Filter did L RTC repair several years ago 9.  Nasal Fracture: Continue Keflex thru 03/05-->follow up with ENT in a week 10.  Pneumonia- Left chest wall pain/SOB: Wean oxygen. Encourage pulmonary hygiene  3/4- wasn't wearing this AM 11. Chronic HFrEF: Compensated. Daily weight and strict I/O             --ON Jardiance, Metoprolol, Pravastatin, Spironolactone, Entresto  3/4- ordered daily weights-will monitor for  trend 12. Anxiety d/o: Continue Klonopin prn 13. Insomnia: On Restoril 30 mg/hs 14. Wheezing: Hx smoking plus not taking deep breaths due to left rib painFlutter valve qid for pulmonary hygiene. Will schedule  duo nebs qid for now.   15.  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 Thrush  3/4- will give Diflucan 200 mg x1 and then 100 mg daily x 6 days- might need to increase dose if doesn't improve.   3/5- will add Magic mouthwash with lidocaine- QID- for 7 days 17. Constipation  3/4- will give Sorbitol 30 mg x1 to get him to have BM after therapy- has been 3 days.   3/5- had good BM- con't regimen 18. Azotemia  3/4- will recheck labs Thursday since with HF, Jabori't want him to get too wet.   3/5- asked pt to drink a little more than currently drinking  I spent a total of 43    minutes on total care today- >50% coordination of care- due to d/w about thrush- adding magic mouthwash and changing pain meds so easier to access from pixus.    LOS: 2 days A FACE TO FACE EVALUATION WAS PERFORMED  Dustin Sampson 12/27/2023, 8:06 AM

## 2023-12-27 NOTE — Evaluation (Signed)
 Speech Language Pathology Assessment and Plan  Patient Details  Name: Dustin Sampson MRN: [id number] Date of Birth: October 21, 1966  SLP Diagnosis: Dysphagia  Rehab Potential: Good ELOS: 14 days   Today's Date: 12/27/2023 SLP Individual Time: 0901-1000 SLP Individual Time Calculation (min): 59 min   Hospital Problem: Principal Problem:   Subdural hematoma (HCC)  Past Medical History:  Past Medical History:  Diagnosis Date   Anxiety    Arthritis    Diabetes mellitus without complication (HCC)    Gout    Hyperlipemia    Hypertension    Hypothyroidism    Insomnia    Pneumonia    Stroke [facility])    Thyroid disease    Past Surgical History:  Past Surgical History:  Procedure Laterality Date   ABDOMINAL AORTOGRAM W/LOWER EXTREMITY Bilateral 08/21/2020   Procedure: ABDOMINAL AORTOGRAM W/LOWER EXTREMITY;  Surgeon: Sherren Kerns, MD;  Location: [facility];  Service: Cardiovascular;  Laterality: Bilateral;   ABDOMINAL AORTOGRAM W/LOWER EXTREMITY N/A 09/04/2020   Procedure: ABDOMINAL AORTOGRAM W/LOWER EXTREMITY;  Surgeon: Sherren Kerns, MD;  Location: [facility];  Service: Cardiovascular;  Laterality: N/A;   COLONOSCOPY     x2   FEMORAL ARTERY EXPLORATION Left 09/04/2020   Procedure: FEMORAL ARTERY EXPLORATION;  Surgeon: Chuck Hint, MD;  Location: [facility] OR;  Service: Vascular;  Laterality: Left;   NASAL SINUS SURGERY     PERIPHERAL VASCULAR INTERVENTION Left 08/21/2020   Procedure: PERIPHERAL VASCULAR INTERVENTION;  Surgeon: Sherren Kerns, MD;  Location: [facility];  Service: Cardiovascular;  Laterality: Left;  sfa stent x 3   PERIPHERAL VASCULAR INTERVENTION  09/04/2020   Procedure: PERIPHERAL VASCULAR INTERVENTION;  Surgeon: Sherren Kerns, MD;  Location: [facility];  Service: Cardiovascular;;  Right SFA   RIGHT/LEFT HEART CATH AND CORONARY ANGIOGRAPHY N/A 11/16/2023   Procedure: RIGHT/LEFT HEART CATH AND CORONARY ANGIOGRAPHY;  Surgeon:  Yates Decamp, MD;  Location: [facility];  Service: Cardiovascular;  Laterality: N/A;   SHOULDER ARTHROSCOPY WITH ROTATOR CUFF REPAIR AND SUBACROMIAL DECOMPRESSION Left 02/04/2021   Procedure: SHOULDER ARTHROSCOPY WITH ROTATOR CUFF REPAIR AND SUBACROMIAL DECOMPRESSION;  Surgeon: Jones Broom, MD;  Location: [facility] ORS;  Service: Orthopedics;  Laterality: Left;   WISDOM TOOTH EXTRACTION     WRIST SURGERY     bilat    Assessment / Plan / Recommendation Clinical Impression   Dustin Sampson is a 58 year old  male with history of CVA with ataxia, HFrEF, insomnia, anxiety d/o, PAD, frequent falls who was admitted on 12/19/23 after tripping and falling onto and complaints of nose and right shoulder pain with epistaxis. He was not using walker when he fell and noted to have shoulder deformity at admission. Work up revealed small area of SDH along anterior falx and minimally displaced nasal Fx on left. Dr.Pool recommended repeat CT for monitoring and if stable OK for d/c as well as holding Plavix for 7 days. He developed SOB requiring oxygen on 02/27 with reports of left chest pain as well as cough. Has also had tachycardia felt to be pain mediated. CXR was negative. PT/OT consulted and patient noted to require min to mod assist due to wide based staggering gait as well as anxiety. CIR recommended due to functional decline. Patient was admitted to CIR on 12/25/23.   BSE: Pt presents with a mild oropharyngeal dysphagia. Oral mechanism exam was unremarkable and pt has natural dentition. Patient endorsed hx of s/s aspiration with thin liquids  warranting self imposed compensatory strategies including, bolus holding before initiating swallow and small sips. He also reported positive hx of GERD. At baseline, pt observed with wet vocal quality and wheezing sound which cleared with cues for throat clear. He endorsed feelings of congestion. Pt alert and seated upright during evaluation. He consumed thin liquids via straw and  cup. Small single sips via straw were unremarkable. Large consecutive sips via straw resulted in weak cough after swallow. Pt also presented with cough after swallow in response to single sips via cup edge. SLP recommending continuation of regular solids and thin liquids, meds whole with water, and standard swallowing strategies with emphasis on small single sips at a time. Pt may benefit from MBSS during admission.  Pt would benefit from skilled SLP services to maximize dysphagia in order to maximize his independence prior to discharge. Anticipate pt will not require follow up SLP services at discharge.  Skilled Therapeutic Interventions          BSE, informal assessment measures, and OME administered. Please see full report for additional details.     SLP Assessment  Patient will need skilled Speech Lanaguage Pathology Services during CIR admission    Recommendations  SLP Diet Recommendations: Thin;Age appropriate regular solids Liquid Administration via: Straw;Cup Medication Administration: Whole meds with liquid Supervision: Patient able to self feed Compensations: Slow rate;Small sips/bites Postural Changes and/or Swallow Maneuvers: Seated upright 90 degrees;Upright 30-60 min after meal Oral Care Recommendations: Oral care BID Recommendations for Other Services: Neuropsych consult Patient destination: Home Follow up Recommendations: None Equipment Recommended: None recommended by SLP    SLP Frequency 1 to 3 out of 7 days   SLP Duration  SLP Intensity  SLP Treatment/Interventions 14 days  Minumum of 1-2 x/day, 30 to 90 minutes  Dysphagia/aspiration precaution training;Internal/external aids;Cueing hierarchy;Patient/family education    Pain Pain Assessment Pain Scale: 0-10 Pain Score: 5  Pain Type: Acute pain Pain Location: Rib cage Pain Orientation: Left Pain Descriptors / Indicators: Aching  Bedside Swallowing Assessment General Temperature Spikes Noted: No Respiratory  Status: Room air History of Recent Intubation: No Behavior/Cognition: Alert;Cooperative;Pleasant mood Oral Cavity - Dentition: Adequate natural dentition Self-Feeding Abilities: Able to feed self Patient Positioning: Upright in bed Baseline Vocal Quality: Normal Volitional Cough: Strong Volitional Swallow: Able to elicit  Oral Care Assessment Oral Assessment  (WDL): Exceptions to WDL Lips: Cracked;Laceration Teeth: Intact Tongue: Pink;Dry (Being treated for thrush) Mucous Membrane(s): Pink;Moist Saliva: Moist, saliva free flowing Level of Consciousness: Alert Is patient on any of following O2 devices?: None of the above Nutritional status: Dysphagia Oral Assessment Risk : High Risk Ice Chips Ice chips: Not tested Thin Liquid Thin Liquid: Impaired Presentation: Straw;Cup Pharyngeal  Phase Impairments: Cough - Delayed Nectar Thick Nectar Thick Liquid: Not tested Honey Thick Honey Thick Liquid: Not tested Puree Puree: Not tested Solid Solid: Not tested BSE Assessment Risk for Aspiration Impact on safety and function: Mild aspiration risk Other Related Risk Factors: History of pneumonia;History of GERD;History of dysphagia;Previous CVA  Short Term Goals: Week 1: SLP Short Term Goal 1 (Week 1): Patient will utilize compensatory speech strategies to 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 SLP Short Term Goal 2 (Week 1): Patient will recall 4 compensatory speech intelligibility strategeis with 100% acc with sup A. SLP Short Term Goal 3 (Week 1): Patient wlll consume thin liquids without s/sx pen/asp in 80% opportunites with sup A SLP Short Term Goal 4 (Week 1): Patient will recall safe swallwoing strategies with 100% acc and sup  A SLP Short Term Goal 5 (Week 1): Pt will complete MBSS in order to r/o aspiration.  Refer to Care Plan for Long Term Goals  Recommendations for other services: Neuropsych  Discharge Criteria: Patient will be discharged from SLP if  patient refuses treatment 3 consecutive times without medical reason, if treatment goals not met, if there is a change in medical status, if patient makes no progress towards goals or if patient is discharged from hospital.  The above assessment, treatment plan, treatment alternatives and goals were discussed and mutually agreed upon: by patient  [name] 12/27/2023, 11:27 AM

## 2023-12-28 ENCOUNTER — Inpatient Hospital Stay (HOSPITAL_COMMUNITY)

## 2023-12-28 DIAGNOSIS — S065XAA Traumatic subdural hemorrhage with loss of consciousness status unknown, initial encounter: Secondary | ICD-10-CM | POA: Diagnosis not present

## 2023-12-28 LAB — BASIC METABOLIC PANEL
Anion gap: 9 (ref 5–15)
BUN: 33 mg/dL — ABNORMAL HIGH (ref 6–20)
CO2: 22 mmol/L (ref 22–32)
Calcium: 9 mg/dL (ref 8.9–10.3)
Chloride: 102 mmol/L (ref 98–111)
Creatinine, Ser: 1.5 mg/dL — ABNORMAL HIGH (ref 0.61–1.24)
GFR, Estimated: 54 mL/min — ABNORMAL LOW (ref >60)
Glucose, Bld: 120 mg/dL — ABNORMAL HIGH (ref 70–99)
Potassium: 4.6 mmol/L (ref 3.5–5.1)
Sodium: 133 mmol/L — ABNORMAL LOW (ref 135–145)

## 2023-12-28 LAB — CBC
HCT: 36 % — ABNORMAL LOW (ref 39.0–52.0)
Hemoglobin: 12 g/dL — ABNORMAL LOW (ref 13.0–17.0)
MCH: 28.5 pg (ref 26.0–34.0)
MCHC: 33.3 g/dL (ref 30.0–36.0)
MCV: 85.5 fL (ref 80.0–100.0)
Platelets: 311 10*3/uL (ref 150–400)
RBC: 4.21 MIL/uL — ABNORMAL LOW (ref 4.22–5.81)
RDW: 13.7 % (ref 11.5–15.5)
WBC: 8 10*3/uL (ref 4.0–10.5)
nRBC: 0 % (ref 0.0–0.2)

## 2023-12-28 LAB — GLUCOSE, CAPILLARY
Glucose-Capillary: 105 mg/dL — ABNORMAL HIGH (ref 70–99)
Glucose-Capillary: 103 mg/dL — ABNORMAL HIGH (ref 70–99)
Glucose-Capillary: 108 mg/dL — ABNORMAL HIGH (ref 70–99)
Glucose-Capillary: 138 mg/dL — ABNORMAL HIGH (ref 70–99)

## 2023-12-28 MED ORDER — BENZONATATE 100 MG PO CAPS
200.0000 mg | ORAL_CAPSULE | Freq: Three times a day (TID) | ORAL | Status: DC
  Administered 2023-12-28 – 2024-01-10 (×39): 200 mg via ORAL
  Filled 2023-12-28 (×40): qty 2

## 2023-12-28 MED ORDER — FLUCONAZOLE 100 MG PO TABS
200.0000 mg | ORAL_TABLET | Freq: Every day | ORAL | Status: AC
  Administered 2023-12-28 – 2024-01-01 (×5): 200 mg via ORAL
  Filled 2023-12-28 (×4): qty 2

## 2023-12-28 MED ORDER — SODIUM CHLORIDE 0.9 % IV SOLN: INTRAVENOUS | Status: DC

## 2023-12-28 MED ORDER — IPRATROPIUM-ALBUTEROL 0.5-2.5 (3) MG/3ML IN SOLN
3.0000 mL | Freq: Four times a day (QID) | RESPIRATORY_TRACT | Status: DC
  Administered 2023-12-28 – 2023-12-29 (×4): 3 mL via RESPIRATORY_TRACT
  Filled 2023-12-28 (×4): qty 3

## 2023-12-28 NOTE — Progress Notes (Addendum)
 PROGRESS NOTE   Subjective/Complaints:  Thrush very slightly better- but not a lot- still hurts to eat.  Mos tof pain in ribs- hurts with every breath somewhat and really bad whenever coughs, which is a lot.  Shoulder hurts a little, if not completely stable.  LBM 2 days ago.  Doesn't know why keeps eyes closed, but thinks it's because they are dry- in AM- doesn't want eye drops.    ROS:   Pt denies SOB, abd pain, CP, N/V/C/D, and vision changes   Thrush- significant (+)- only mild improvement Pain- still an issue Constipation resolved   Objective:   No results found.  Recent Labs    12/26/23 0533 12/28/23 0630  WBC 9.4 8.0  HGB 12.1* 12.0*  HCT 36.2* 36.0*  PLT 272 311   Recent Labs    12/26/23 0533 12/28/23 0630  NA 134* 133*  K 3.8 4.6  CL 101 102  CO2 22 22  GLUCOSE 121* 120*  BUN 29* 33*  CREATININE 1.20 1.50*  CALCIUM 9.2 9.0    Intake/Output Summary (Last 24 hours) at 12/28/2023 0839 Last data filed at 12/27/2023 1900 Gross per 24 hour  Intake 120 ml  Output 475 ml  Net -355 ml        Physical Exam: Vital Signs Blood pressure 103/80, pulse 87, temperature 97.7 F (36.5 C), temperature source Oral, resp. rate 16, height 5\' 9"  (1.753 m), weight 89.4 kg, SpO2 95%.      General: awake, alert, appropriate, NAD HENT: conjugate gaze; oropharynx moist but still has thrush CV: regular rate and rhythm; no JVD Pulmonary: CTA B/L; no W/R/R- good air movement but thick cough GI: soft, NT, ND, (+)BS- hypoactive Psychiatric: appropriate Neurological: Ox3  Less dysarthric this AM-  Neurologic: Cranial nerves II through XII intact, motor strength is 5/5 in RIght  deltoid, bicep, tricep, grip,5/5 right and 4/5 left  hip flexor, knee extensors, ankle dorsiflexor and plantar flexor Left grip 4/5 Sensory exam normal sensation to light touch in bilateral upper and lower extremities Cerebellar exam  normal finger to nose to finger as well as heel to shin in right upper and lower extremities, LUE NT due to sling, LLE moderate ataxia with heel shin  Assessment/Plan: 1. Functional deficits which require 3+ hours per day of interdisciplinary therapy in a comprehensive inpatient rehab setting. Physiatrist is providing close team supervision and 24 hour management of active medical problems listed below. Physiatrist and rehab team continue to assess barriers to discharge/monitor patient progress toward functional and medical goals  Care Tool:  Bathing              Bathing assist Assist Level: Maximal Assistance - Patient 24 - 49%     Upper Body Dressing/Undressing Upper body dressing   What is the patient wearing?: Pull over shirt    Upper body assist Assist Level: Maximal Assistance - Patient 25 - 49%    Lower Body Dressing/Undressing Lower body dressing            Lower body assist Assist for lower body dressing: Maximal Assistance - Patient 25 - 49%     Toileting Toileting    Toileting assist  Assist for toileting: Maximal Assistance - Patient 25 - 49%     Transfers Chair/bed transfer  Transfers assist     Chair/bed transfer assist level: Moderate Assistance - Patient 50 - 74%     Locomotion Ambulation   Ambulation assist      Assist level: Minimal Assistance - Patient > 75% Assistive device: Walker-hemi Max distance: 25   Walk 10 feet activity   Assist     Assist level: Minimal Assistance - Patient > 75% Assistive device: Walker-hemi   Walk 50 feet activity   Assist Walk 50 feet with 2 turns activity did not occur: Safety/medical concerns         Walk 150 feet activity   Assist Walk 150 feet activity did not occur: Safety/medical concerns         Walk 10 feet on uneven surface  activity   Assist     Assist level: Moderate Assistance - Patient - 50 - 74% Assistive device: Education administrator Is  the patient using a wheelchair?: Yes Type of Wheelchair: Manual    Wheelchair assist level: Supervision/Verbal cueing Max wheelchair distance: 200+    Wheelchair 50 feet with 2 turns activity    Assist        Assist Level: Supervision/Verbal cueing   Wheelchair 150 feet activity     Assist      Assist Level: Supervision/Verbal cueing   Blood pressure 103/80, pulse 87, temperature 97.7 F (36.5 C), temperature source Oral, resp. rate 16, height 5\' 9"  (1.753 m), weight 89.4 kg, SpO2 95%.  Medical Problem List and Plan: 1. Functional deficits secondary to TBI             -patient may  shower             -ELOS/Goals: 14-18d Sup  D/c TBD  Con't CIR PT and OT and SLP for swallowing 2.  Antithrombotics: -DVT/anticoagulation:  Mechanical: Sequential compression devices, below knee Bilateral lower extremities             -antiplatelet therapy: N/A--Plavix to be held for a week per Dr. Jordan Likes --Resume on 03/04 3. Pain Management:  On IV hydromorphone and Oxycodone prn.              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 Mood/Behavior/Sleep: LCSW to follow for evaluation and support.              -antipsychotic agents: N/A   5. Neuropsych/cognition: This patient is capable of making decisions on his own behalf. 6. Skin/Wound Care: Routine pressure measures 7. Fluids/Electrolytes/Nutrition: Monitor I/O. Check CMET in am 8.  Left shoulder dislocation: NWB with sling X 2 weeks. Follow up with ortho in 2 weeks.  Dr Ave Filter did L RTC repair several years ago 9.  Nasal Fracture: Continue Keflex thru 03/05-->follow up with ENT in a week 10.  Pneumonia- Left chest wall pain/SOB: Wean oxygen. Encourage pulmonary  hygiene  3/4- wasn't wearing this AM 11. Chronic HFrEF: Compensated. Daily weight and strict I/O             --ON Jardiance, Metoprolol, Pravastatin, Spironolactone, Entresto  3/4- ordered daily weights-will monitor  for trend 12. Anxiety d/o: Continue Klonopin prn 13. Insomnia: On Restoril 30 mg/hs 14. Wheezing: Hx smoking plus not taking deep breaths due to left rib painFlutter valve qid for pulmonary hygiene. Will schedule  duo nebs qid for now.   15.  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 Thrush  3/4- will give Diflucan 200 mg x1 and then 100 mg daily x 6 days- might need to increase dose if doesn't improve.   3/5- will add Magic mouthwash with lidocaine- QID- for 7 days 17. Constipation  3/4- will give Sorbitol 30 mg x1 to get him to have BM after therapy- has been 3 days.   3/5- had good BM- con't regimen 18. AKI  3/4- will recheck labs Thursday since with HF, Ziggy't want him to get too wet.   3/5- asked pt to drink a little more than currently drinking  3/6- Pt's BUN up to 33 and Cr up to 1.50- think it's because hurts to drink/eat, so will give some IVFs to tune back up- usually Cr <1 and is now 1.5- will give 75cc/hour starting after therapy and do for 15 hours- won't do more, since has HF.  19. Wet cough  3/6- Will increase Tessalon to 200 mg TID scheduled  -independently reviewed CXR- Ladarryl't see pneumonia- however will wait for final read  I spent a total of  51  minutes on total care today- >50% coordination of care- due to  Independent review of CXR, labs; as well as going back t o speak to pt about labs-  and the IVFs- also increasing tessalon pearls and Diflucan   Addendum- pt refuses IVFs today- says he will drink- at least 8 cups- and will recheck in AM.    LOS: 3 days A FACE TO FACE EVALUATION WAS PERFORMED  Dustin Sampson 12/28/2023, 8:39 AM

## 2023-12-28 NOTE — Progress Notes (Signed)
 Physical Therapy Session Note  Patient Details  Name: Dustin Sampson MRN: [id number] Date of Birth: Aug 23, 1966  Today's Date: 12/28/2023 PT Individual Time: 6295-2841 PT Individual Time Calculation (min): 38 min   Short Term Goals: Week 1:  PT Short Term Goal 1 (Week 1): Pt will transfer sit to stand w/ CGA consistently safely. PT Short Term Goal 2 (Week 1): Pt will transfer w/c <> bed w/ CGA. PT Short Term Goal 3 (Week 1): Pt will amb w/ LRAD and CGA x 75'  Skilled Therapeutic Interventions/Progress Updates:     Pt received supine in bed and agreeable to session focusing on gait with hemi-walker. Pt has no c/o pain at this time.   Pt performed ambulatory transfer to w/c with hemi walker and minA for sit<>stand d/t pt's posterior COM. Pt dependently transported to hallway for gait training.   MinA for sit<>stand d/t posterior COM, verbal cues for anterior weight shift. Pt performed ~20' gait training with CGA with hemi walker and 4# ankle weights - verbal cues for step width and rhythm/timing of hemi walker advancement. Pt had increased BOS, decreased LLE, and global ataxia (more notable during hemi walker advancement than yesterday). Pt had posterior LOB requiring modA to recover after pt was unable to advance LLE - pt states that not being able to get his LLE to move is the primary cause of his falls at home. He mentioned that riding a bike for a few minutes typically helps reciprocal stepping patterns so pt dependently transported to dayroom. Pt performed x3 min of kinetron (40 rpm) for reciprocal LE movement. Pt notes that he was scared from the LOB in the hallway and his speech is less intelligible and more slurred than the beginning of session. Pt performed another ~15' of gait training with improved reciprocal stepping and step width but had tendency for posterior COM requiring minA for stability.   Pt dependently transported back to room and performed stand pivot transfer to toilet with hemi  walker and CGA. Direct handoff to nsg.   Therapy Documentation Precautions:  Precautions Precautions: Fall Shoulder Interventions: Shoulder sling/immobilizer Precaution/Restrictions Comments: ataxia Required Braces or Orthoses: Sling (Lt shoulder) Restrictions Weight Bearing Restrictions Per Provider Order: Yes LUE Weight Bearing Per Provider Order: Non weight bearing Other Position/Activity Restrictions: sling for 2 weeks until ortho follow-up General:       Therapy/Group: Individual Therapy  Collins Scotland 12/28/2023, 2:06 PM

## 2023-12-28 NOTE — Progress Notes (Signed)
 Met with patient to review current situation , team conferences and plan of care. Reviewed medications, dietary recommendations, daily weights, congestive heart failure zones. Continue to follow along to provide educational needs to facilitate preparation for discharge.

## 2023-12-28 NOTE — Progress Notes (Signed)
 Occupational Therapy Session Note  Patient Details  Name: Dustin Sampson MRN: [id number] Date of Birth: 24-Jun-1966  Today's Date: 12/28/2023 OT Individual Time: 0800-0900 & 1400-1445 OT Individual Time Calculation (min): 60 min & 45 min   Short Term Goals: Week 1:  OT Short Term Goal 1 (Week 1): Pt will complete static standing activity with Min A with LRAD and no LOB OT Short Term Goal 2 (Week 1): Pt will complete LB dressing with Mod A with AE as necessary OT Short Term Goal 3 (Week 1): Pt will complete toileting at Mod A with LRAD  Skilled Therapeutic Interventions/Progress Updates:      Therapy Documentation Precautions:  Precautions Precautions: Fall Shoulder Interventions: Shoulder sling/immobilizer Precaution/Restrictions Comments: ataxia Required Braces or Orthoses: Sling (Lt shoulder) Restrictions Weight Bearing Restrictions Per Provider Order: Yes LUE Weight Bearing Per Provider Order: Non weight bearing Other Position/Activity Restrictions: sling for 2 weeks until ortho follow-up Session 1 General: "This is great thank you!" Pt supine in bed upon OT arrival, agreeable to OT session.  Pain:  unrated pain reported in ribs when coughing, activity, intermittent rest breaks, distractions provided for pain management, pt reports tolerable to proceed.   ADL: Pt completed ADL tasks in order to increase independence with tasks while managing ataxia. Pt completed the following exercises at listed levels of assist: Bed mobility: SBA from flat bed using bed rails, reported slight dizziness upon rising, resolving within ~2 min rest break Grooming/oral hygiene: SBA seated at sink with increased time to manage containers, using electric toothbrush Transfers: bed><W/C transfer using squat pivot method, pt educated on squat pivot method for home as well when ataxia is worse for fall prevention  Other Treatments: OT educated pt on weighted adaptive equipment and feeding equipment to  increase independence while managing ataxia. OT educated on equipment such as weighted feeding utensils, cups, bracelets for UE ataxia and discrete weighted vests. Pt appreciative of information and OT issued weighted mug for pt use.   Pt supine in bed with bed alarm activated, 2 bed rails up, call light within reach and 4Ps assessed.   Session 2 General: "I'm tired this afternoon" Pt supine in bed upon OT arrival, agreeable to OT session.  Pain: no pain reported  ADL: Pt and OT discussed toileting aides for increased independence with completing posterior hygiene. OT recommended bidet from [employer] or [employer] such as freedom wand and using wipes. Pt interested in idea of bidet and is discussing with wife.  Balance: Pt completed a variety of standing activities in order to promote increased balance strategies with ADL participation. Pt completed all activities at Min A level with hemi walker. Pt completed sit to stand with step forward/backward with 4# ankle weights on BLE for ataxia management. Pt requiring VC for increased stride length. One instance of increased instability requiring Mod A to correct. Pt completed multiple trials of task.  Other Treatments: OT provided therapeutic use of self for discussion d/t pt feeling as if she should not be as tires as he is. OT educated that rehab is healing physically and emotionally and can cause increased fatigue. Pt responded well to conversation.   Pt completed squat pivot with bed rail at CGA from W/C then bed mobility at SBA supine in bed with bed alarm activated, 4 bed rails up per pt request, call light within reach and 4Ps assessed.   Therapy/Group: Individual Therapy  Velia Meyer, OTD, OTR/L 12/28/2023, 4:42 PM

## 2023-12-28 NOTE — Progress Notes (Signed)
 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 SLP Short Term Goal 2 (Week 1): Patient will recall 4 compensatory speech intelligibility strategeis with 100% acc with sup A. SLP Short Term Goal 3 (Week 1): Patient wlll consume thin liquids without s/sx pen/asp in 80% opportunites with sup A SLP Short Term Goal 4 (Week 1): Patient will recall safe swallwoing strategies with 100% acc and sup A SLP Short Term Goal 5 (Week 1): Pt will complete MBSS in order to r/o aspiration.  Skilled Therapeutic Interventions: SLP conducted skilled therapy session targeting communication and swallowing goals. Patient endorses changes in speech and swallowing function that fluctuate alongside motor skills day to day. 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 Patient endorses that today was a "good day" in regard to ataxia symptoms and demonstrated 100% intelligibility with modI. Throughout session, tolerated sips of thin liquids from straw with no overt s/sx of penetration/aspiration across 5+ trials. Patient endorses that on "bad days", he will choke/cough with consecutive sips of thin liquids and reports that these episodes are more frequent in the mornings. MBS scheduled for 3/10 at 9 AM to formally address oropharyngeal swallowing function. Patient endorsed independently that on "bad speech" days, he utilizes loud, slow, and overarticulated speech to compensate, but even so often only achieves ~60% intelligibility by his estimate. SLP introduced patient to free text to speech  iPhone application to assist to commuincation breakdowns during these episodes. Patient utilized and added common phrases with overall min assist. Patient was left in lowered bed with call bell in reach and bed alarm set. SLP will continue to target goals per plan of care.       Pain Pain Assessment Pain Scale: 0-10 Pain Score: 3  Pain Location: Rib cage  Therapy/Group: Individual Therapy  Jeannie Done, M.A., CCC-SLP  Yetta Barre 12/28/2023, 12:18 PM

## 2023-12-28 NOTE — IPOC Note (Signed)
 Overall Plan of Care [facility]) Patient Details Name: Dustin Sampson MRN: [id number] DOB: [birth year]  Admitting Diagnosis: Subdural hematoma [facility])  Hospital Problems: Principal Problem:   Subdural hematoma (HCC)     Functional Problem List: Nursing Bladder, Bowel, Endurance, Pain, Safety  PT Balance, Safety, Endurance, Motor, Pain  OT Balance, Safety, Skin Integrity, Endurance, Motor, Pain  SLP Motor  TR         Basic ADL's: OT Grooming, Bathing, Dressing, Toileting     Advanced  ADL's: OT       Transfers: PT Bed Mobility, Bed to Chair, Car, Occupational psychologist, Research scientist (life sciences): PT Ambulation, Psychologist, prison and probation services, Stairs     Additional Impairments: OT None  SLP Swallowing      TR      Anticipated Outcomes Item Anticipated Outcome  Self Feeding SBA  Swallowing  sup A   Basic self-care  SBA  Toileting  SBA   Bathroom Transfers SBA  Bowel/Bladder  manage bowels with medications/manage bladder with time toileting  Transfers  supervision  Locomotion  supervision w/ LRAD  Communication  sup A  Cognition     Pain  <4 w/ prns  Safety/Judgment  manage safety with supervision   Therapy Plan: PT Intensity: Minimum of 1-2 x/day ,45 to 90 minutes PT Frequency: 5 out of 7 days PT Duration Estimated Length of Stay: 2 weeks OT Intensity: Minimum of 1-2 x/day, 45 to 90 minutes OT Frequency: 5 out of 7 days OT Duration/Estimated Length of Stay: 12-14 days SLP Intensity: Minumum of 1-2 x/day, 30 to 90 minutes SLP Frequency: 1 to 3 out of 7 days SLP Duration/Estimated Length of Stay: 14 days   Team Interventions: Nursing Interventions Patient/Family Education, Bladder Management, Bowel Management, Disease Management/Prevention, Pain Management, Discharge Planning, Medication Management  PT interventions Ambulation/gait training, Community reintegration, Neuromuscular re-education, Stair training, UE/LE Strength taining/ROM, Wheelchair  propulsion/positioning, Warden/ranger, Discharge planning, Skin care/wound management, Therapeutic Activities, UE/LE Coordination activities, Therapeutic Exercise, Patient/family education, Functional mobility training  OT Interventions Balance/vestibular training, Discharge planning, Pain management, Self Care/advanced ADL retraining, Therapeutic Activities, UE/LE Coordination activities, Disease mangement/prevention, Functional mobility training, Cognitive remediation/compensation, Patient/family education, Skin care/wound managment, Therapeutic Exercise, Visual/perceptual remediation/compensation, Firefighter, Fish farm manager, Neuromuscular re-education, Psychosocial support, Splinting/orthotics, UE/LE Strength taining/ROM, Wheelchair propulsion/positioning  SLP Interventions Dysphagia/aspiration precaution training, Internal/external aids, Financial trader, Patient/family education  TR Interventions    SW/CM Interventions Discharge Planning, Psychosocial Support, Patient/Family Education   Barriers to Discharge MD  Medical stability, Home enviroment access/loayout, Wound care, Lack of/limited family support, and Weight bearing restrictions  Nursing Decreased caregiver support, Home environment access/layout Discharge to [facility]  Home Access: Stairs to enter  Secretary/administrator of Steps: 2  Entrance Stairs-Rails: None  Home Layout: One level  PT Inaccessible home environment, Lack of/limited family support    OT Decreased caregiver support family works during day  SLP      SW Community education officer for SNF coverage, Other (comments)     Team Discharge Planning: Destination: PT-Home ,OT- Home , SLP-Home Projected Follow-up: PT-Home health PT, OT-  Home health OT, SLP-None Projected Equipment Needs: PT-To be determined, OT- 3 in 1 bedside comode, To be determined, SLP-None recommended by SLP Equipment Details: PT-pt has shower chair, RW, transport chair and  power chair (which is broke), OT-owns light weight PWC that is broken, shower seat, RW Patient/family involved in discharge planning: PT- Patient,  OT-Patient, SLP-Patient  MD ELOS: 2 weeks Medical Rehab Prognosis:  Good Assessment: The  patient has been admitted for CIR therapies with the diagnosis of SDH. The team will be addressing functional mobility, strength, stamina, balance, safety, adaptive techniques and equipment, self-care, bowel and bladder mgt, patient and caregiver education, L shoulder NWB. Goals have been set at SBA- min A. Anticipated discharge destination is home.        See Team Conference Notes for weekly updates to the plan of care

## 2023-12-29 DIAGNOSIS — S065XAA Traumatic subdural hemorrhage with loss of consciousness status unknown, initial encounter: Secondary | ICD-10-CM | POA: Diagnosis not present

## 2023-12-29 LAB — BASIC METABOLIC PANEL
Anion gap: 7 (ref 5–15)
BUN: 30 mg/dL — ABNORMAL HIGH (ref 6–20)
CO2: 20 mmol/L — ABNORMAL LOW (ref 22–32)
Calcium: 8.6 mg/dL — ABNORMAL LOW (ref 8.9–10.3)
Chloride: 105 mmol/L (ref 98–111)
Creatinine, Ser: 1.41 mg/dL — ABNORMAL HIGH (ref 0.61–1.24)
GFR, Estimated: 58 mL/min — ABNORMAL LOW (ref >60)
Glucose, Bld: 107 mg/dL — ABNORMAL HIGH (ref 70–99)
Potassium: 4.2 mmol/L (ref 3.5–5.1)
Sodium: 132 mmol/L — ABNORMAL LOW (ref 135–145)

## 2023-12-29 LAB — GLUCOSE, CAPILLARY
Glucose-Capillary: 110 mg/dL — ABNORMAL HIGH (ref 70–99)
Glucose-Capillary: 125 mg/dL — ABNORMAL HIGH (ref 70–99)
Glucose-Capillary: 133 mg/dL — ABNORMAL HIGH (ref 70–99)
Glucose-Capillary: 157 mg/dL — ABNORMAL HIGH (ref 70–99)

## 2023-12-29 MED ORDER — SORBITOL 70 % SOLN
30.0000 mL | Freq: Once | Status: DC
  Filled 2023-12-29: qty 30

## 2023-12-29 MED ORDER — IPRATROPIUM-ALBUTEROL 0.5-2.5 (3) MG/3ML IN SOLN
3.0000 mL | Freq: Two times a day (BID) | RESPIRATORY_TRACT | Status: DC
  Administered 2023-12-29 – 2023-12-30 (×2): 3 mL via RESPIRATORY_TRACT
  Filled 2023-12-29 (×2): qty 3

## 2023-12-29 NOTE — Progress Notes (Signed)
 Physical Therapy Session Note  Patient Details  Name: Dustin Sampson MRN: [id number] Date of Birth: 12-05-1965  Today's Date: 01/01/2024 PT Individual Time:  1415-1530    Minutes: 75 minutes  Short Term Goals: Week 1:  PT Short Term Goal 1 (Week 1): Pt will transfer sit to stand w/ CGA consistently safely. PT Short Term Goal 2 (Week 1): Pt will transfer w/c <> bed w/ CGA. PT Short Term Goal 3 (Week 1): Pt will amb w/ LRAD and CGA x 75'  Skilled Therapeutic Interventions/Progress Updates:    Pt seated in w/c on arrival and agreeable to therapy. Pt with intermittent unrated rib pain-worse with coughing and deep breaths, no intervention required.  Pt reports 5/10 dizziness on arrival. BP=93/73(81). After sitting several minutes, BP=86/73. Attempted seated LE exercise (ankle pumps, LAQ, and marches) for venous return, but BP then found to be 88/71 (79) HR=102. Pt assisted to bed with mod a for safety awareness. Supine BP=99/75 (84) HR=96. After several minutes and donning ted hose, BP=104/68 (80), HR=94. Pt reports down to 3/10 dizziness.   Pt then participated in bed level coordination activity, alternating toe taps to moving target (therapist's outstretched hand) to improve LB coordination x 2 bouts. Pt with decr coordination further from midline and at knee vs toe.   Pt performed the following exercises to promote LE strength and endurance:  -4 x 12 glute bridges--discussed adequate rest breaks for muscle strength building -theraband leg extensions with doubled green tband 2 bouts to 2-3 reps in reserve  Pt remained in bed at end of session with nsg present, was left with all needs in reach and alarm active.   Therapy Documentation Precautions:  Precautions Precautions: Fall Shoulder Interventions: Shoulder sling/immobilizer Precaution/Restrictions Comments: ataxia Required Braces or Orthoses: Sling (Lt shoulder) Restrictions Weight Bearing Restrictions Per Provider Order: Yes (left arm  shoulder) LUE Weight Bearing Per Provider Order: Non weight bearing Other Position/Activity Restrictions: sling for 2 weeks until ortho follow-up General:       Therapy/Group: Individual Therapy  Juluis Rainier 12/29/2023, 2:44 PM

## 2023-12-29 NOTE — Progress Notes (Addendum)
 Occupational Therapy Session Note  Patient Details  Name: Dustin Sampson MRN: [id number] Date of Birth: 01/14/1966  Today's Date: 12/29/2023 OT Individual Time: 1300-1345 OT Individual Time Calculation (min): 45 min    Short Term Goals: Week 1:  OT Short Term Goal 1 (Week 1): Pt will complete static standing activity with Min A with LRAD and no LOB OT Short Term Goal 2 (Week 1): Pt will complete LB dressing with Mod A with AE as necessary OT Short Term Goal 3 (Week 1): Pt will complete toileting at Mod A with LRAD  Skilled Therapeutic Interventions/Progress Updates:    Pt resting in bed upon arrival and agreeable to therapy. Supine>sit EOB using bed rails with mod A. Pt reported he does not have bed rails at home. Pt remained seated EOB approx 5 mins prior to transfer to w/c with hemi walker. Increase LLE ataxia noted during transfer and pt required max A to correct LOB. Pt with significant posterior lean during stand>sit. Squat pivot transfer to NuStep with min A to pt's Rt. NuStep 5 mins x 2 (level 4 and 3) BLE only. Pt with slight SOB after each set. Transfer back to w/c with min A. Sit<>stand x 5 and standing balance with min A. Educated pt on importance of anterior lean during sit<>stand. Pt returned to room and reamined in w/c with all needs within reach.   Therapy Documentation Precautions:  Precautions Precautions: Fall Shoulder Interventions: Shoulder sling/immobilizer Precaution/Restrictions Comments: ataxia Required Braces or Orthoses: Sling (Lt shoulder) Restrictions Weight Bearing Restrictions Per Provider Order: Yes (left arm shoulder) LUE Weight Bearing Per Provider Order: Non weight bearing Other Position/Activity Restrictions: sling for 2 weeks until ortho follow-up Pain: Pt c/o 5/10 pain in Lt rib cage; meds admin prior to therapy    Therapy/Group: Individual Therapy  Rich Brave 12/29/2023, 2:53 PM

## 2023-12-29 NOTE — Progress Notes (Signed)
 Occupational Therapy Session Note  Patient Details  Name: Dustin Sampson MRN: [id number] Date of Birth: 12-31-1965  Today's Date: 12/29/2023 OT Individual Time: 0930-1040 OT Individual Time Calculation (min): 70 min    Short Term Goals: Week 1:  OT Short Term Goal 1 (Week 1): Pt will complete static standing activity with Min A with LRAD and no LOB OT Short Term Goal 2 (Week 1): Pt will complete LB dressing with Mod A with AE as necessary OT Short Term Goal 3 (Week 1): Pt will complete toileting at Mod A with LRAD  Skilled Therapeutic Interventions/Progress Updates:    OT intervention with focus on sit<>stand, standing balance, DME requirements, home safety, discharge planning, and activity tolerance to increase independence with BADLs and functional transfers. Pt will have times when he will be alone at home. Pt reports his ataxia is unpredictable and he never knows when it will worsen. Pt reports he is considering use of Bidet at home but is concerned about access to bathroom. Recommended placing BSC next to bed to use during times without assistance/supervision. Sit<>stand X 5 with pt reporting that his LLE feels weaker and was not "responding" during stand pivot tranfsers with hemiwalker. Onging discharge planning throughout session. Pt returned to room and completed squat pivot tranfser to EOB with CGA. Sit>supine with supervision. Pt remained in bed with all needs within reach. Bed alarm activated.   Therapy Documentation Precautions:  Precautions Precautions: Fall Shoulder Interventions: Shoulder sling/immobilizer Precaution/Restrictions Comments: ataxia Required Braces or Orthoses: Sling (Lt shoulder) Restrictions Weight Bearing Restrictions Per Provider Order: Yes LUE Weight Bearing Per Provider Order: Non weight bearing Other Position/Activity Restrictions: sling for 2 weeks until ortho follow-up Pain:  Pt reports 5/10 Lt rib cage; meds admin prior to therapy   Therapy/Group:  Individual Therapy  Rich Brave 12/29/2023, 10:47 AM

## 2023-12-29 NOTE — Progress Notes (Signed)
 PROGRESS NOTE   Subjective/Complaints:  Pt reports thrush ~ 15% better.   Last night coughed all night-  Did nebs and then coughed all night. But better during day.  Had bad diarrhea x1 yesterday- thought could be jardiance which was stopped-  Had at home intermittently before admission ROS:   Pt denies SOB, abd pain, CP, N/V/C/D, and vision changes   Thrush- significant (+)- only mild improvement Pain- still an issue Constipation resolved   Objective:   DG Chest 2 View Result Date: 12/28/2023 CLINICAL DATA:  Dyspnea and cough.  Follow-up. EXAM: CHEST - 2 VIEW COMPARISON:  12/25/2023 FINDINGS: Interval near complete resolution of the patchy airspace disease seen previously in the lung bases. No edema or focal consolidation. No evidence for pleural effusion. Cardiopericardial silhouette is at upper limits of normal for size. No acute bony abnormality. IMPRESSION: Interval near complete resolution of the bibasilar airspace disease seen previously. Electronically Signed   By: Kennith Center M.D.   On: 12/28/2023 09:33    Recent Labs    12/28/23 0630  WBC 8.0  HGB 12.0*  HCT 36.0*  PLT 311   Recent Labs    12/28/23 0630 12/29/23 0638  NA 133* 132*  K 4.6 4.2  CL 102 105  CO2 22 20*  GLUCOSE 120* 107*  BUN 33* 30*  CREATININE 1.50* 1.41*  CALCIUM 9.0 8.6*    Intake/Output Summary (Last 24 hours) at 12/29/2023 1610 Last data filed at 12/29/2023 9604 Gross per 24 hour  Intake 480 ml  Output 2025 ml  Net -1545 ml        Physical Exam: Vital Signs Blood pressure 108/80, pulse 79, temperature 98.3 F (36.8 C), resp. rate 16, height 5\' 9"  (1.753 m), weight 88.2 kg, SpO2 93%.       General: awake, alert, appropriate, sitting u in bed; NAD HENT: conjugate gaze; oropharynx moist- nasal abrasions CV: regular rate and rhythm; no JVD Pulmonary: has a few rhonchi and crackles which is new- good air movement GI:  soft, NT, ND, (+)BS Psychiatric: appropriate Neurological: Ox3   Less dysarthric this AM-  Neurologic: Cranial nerves II through XII intact, motor strength is 5/5 in RIght  deltoid, bicep, tricep, grip,5/5 right and 4/5 left  hip flexor, knee extensors, ankle dorsiflexor and plantar flexor Left grip 4/5 Sensory exam normal sensation to light touch in bilateral upper and lower extremities Cerebellar exam normal finger to nose to finger as well as heel to shin in right upper and lower extremities, LUE NT due to sling, LLE moderate ataxia with heel shin  Assessment/Plan: 1. Functional deficits which require 3+ hours per day of interdisciplinary therapy in a comprehensive inpatient rehab setting. Physiatrist is providing close team supervision and 24 hour management of active medical problems listed below. Physiatrist and rehab team continue to assess barriers to discharge/monitor patient progress toward functional and medical goals  Care Tool:  Bathing              Bathing assist Assist Level: Maximal Assistance - Patient 24 - 49%     Upper Body Dressing/Undressing Upper body dressing   What is the patient wearing?: Pull over shirt  Upper body assist Assist Level: Maximal Assistance - Patient 25 - 49%    Lower Body Dressing/Undressing Lower body dressing            Lower body assist Assist for lower body dressing: Maximal Assistance - Patient 25 - 49%     Toileting Toileting    Toileting assist Assist for toileting: Maximal Assistance - Patient 25 - 49%     Transfers Chair/bed transfer  Transfers assist     Chair/bed transfer assist level: Moderate Assistance - Patient 50 - 74%     Locomotion Ambulation   Ambulation assist      Assist level: Minimal Assistance - Patient > 75% Assistive device: Walker-hemi Max distance: 25   Walk 10 feet activity   Assist     Assist level: Minimal Assistance - Patient > 75% Assistive device: Walker-hemi    Walk 50 feet activity   Assist Walk 50 feet with 2 turns activity did not occur: Safety/medical concerns         Walk 150 feet activity   Assist Walk 150 feet activity did not occur: Safety/medical concerns         Walk 10 feet on uneven surface  activity   Assist     Assist level: Moderate Assistance - Patient - 50 - 74% Assistive device: Education administrator Is the patient using a wheelchair?: Yes Type of Wheelchair: Manual    Wheelchair assist level: Supervision/Verbal cueing Max wheelchair distance: 200+    Wheelchair 50 feet with 2 turns activity    Assist        Assist Level: Supervision/Verbal cueing   Wheelchair 150 feet activity     Assist      Assist Level: Supervision/Verbal cueing   Blood pressure 108/80, pulse 79, temperature 98.3 F (36.8 C), resp. rate 16, height 5\' 9"  (1.753 m), weight 88.2 kg, SpO2 93%.  Medical Problem List and Plan: 1. Functional deficits secondary to TBI             -patient may  shower             -ELOS/Goals: 14-18d Sup  D/c TBD  Con't CIR PT, and OT and Slp for swallowing 2.  Antithrombotics: -DVT/anticoagulation:  Mechanical: Sequential compression devices, below knee Bilateral lower extremities             -antiplatelet therapy: N/A--Plavix to be held for a week per Dr. Jordan Likes --Resume on 03/04 3. Pain Management:  On IV hydromorphone and Oxycodone prn.              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 Mood/Behavior/Sleep: LCSW to follow for evaluation and support.              -antipsychotic agents: N/A   5. Neuropsych/cognition: This patient is capable of making decisions on his  own behalf. 6. Skin/Wound Care: Routine pressure measures 7. Fluids/Electrolytes/Nutrition: Monitor I/O. Check CMET in am 8.  Left shoulder dislocation: NWB with sling X 2 weeks. Follow up with ortho in 2 weeks.  Dr Ave Filter did L RTC repair several years ago 9.  Nasal  Fracture: Continue Keflex thru 03/05-->follow up with ENT in a week 10.  Pneumonia- Left chest wall pain/SOB: Wean oxygen. Encourage pulmonary hygiene  3/4- wasn't wearing this AM 11. Chronic HFrEF: Compensated. Daily weight and strict I/O             --ON Jardiance, Metoprolol, Pravastatin, Spironolactone, Entresto  3/4- ordered daily weights-will monitor for trend  3/7- weight down ~ 0.8 kg- off jardiance now will monitor 12. Anxiety d/o: Continue Klonopin prn 13. Insomnia: On Restoril 30 mg/hs 14. Wheezing: Hx smoking plus not taking deep breaths due to left rib painFlutter valve qid for pulmonary hygiene. Will schedule  duo nebs qid for now.   15.  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 Thrush  3/4- will give Diflucan 200 mg x1 and then 100 mg daily x 6 days- might need to increase dose if doesn't improve.   3/5- will add Magic mouthwash with lidocaine- QID- for 7 days  3/7- Increased Diflucan to 200mg  yesterday- 15% better now- con't regimen 17. Constipation  3/4- will give Sorbitol 30 mg x1 to get him to have BM after therapy- has been 3 days.   3/5- had good BM- con't regimen  3/7- will give Sorbitol since it's been 3 days on Senna 2 tabs BID and Miralax- con't regimen otherwise 18. AKI  3/4- will recheck labs Thursday since with HF, Carlus't want him to get too wet.   3/5- asked pt to drink a little more than currently drinking  3/6- Pt's BUN up to 33 and Cr up to 1.50- think it's because hurts to drink/eat, so will give some IVFs to tune back up- usually Cr <1 and is now 1.5- will give 75cc/hour starting after therapy and do for 15  hours- won't do more, since has HF.   3/7- pt refused IVFs, but did drink for [name] yesterday- Cr down to 1.41- so it's down enough, won't do IVFs today since better.  BUN down to 30 from 33- of note, stopped jardiance-  19. Wet cough  3/6- Will increase Tessalon to 200 mg TID scheduled  -independently reviewed CXR- Quamel't see pneumonia- however will wait for final read  3/7- sounds a little more junky this Am- up coughing yesterday, however CXR was clear- will monitor Sx's and recheck CXR if needed    I spent a total of 39   minutes on total care today- >50% coordination of care- due to  D/w pt and PA about cough- on max dose tessalon- and con't Diflucan for thrush.     LOS: 4 days A FACE TO FACE EVALUATION WAS PERFORMED  Lorrinda Ramstad 12/29/2023, 8:32 AM

## 2023-12-30 DIAGNOSIS — S065XAA Traumatic subdural hemorrhage with loss of consciousness status unknown, initial encounter: Secondary | ICD-10-CM | POA: Diagnosis not present

## 2023-12-30 LAB — BASIC METABOLIC PANEL
Anion gap: 9 (ref 5–15)
BUN: 29 mg/dL — ABNORMAL HIGH (ref 6–20)
CO2: 21 mmol/L — ABNORMAL LOW (ref 22–32)
Calcium: 8.9 mg/dL (ref 8.9–10.3)
Chloride: 104 mmol/L (ref 98–111)
Creatinine, Ser: 1.48 mg/dL — ABNORMAL HIGH (ref 0.61–1.24)
GFR, Estimated: 55 mL/min — ABNORMAL LOW (ref >60)
Glucose, Bld: 140 mg/dL — ABNORMAL HIGH (ref 70–99)
Potassium: 4.2 mmol/L (ref 3.5–5.1)
Sodium: 134 mmol/L — ABNORMAL LOW (ref 135–145)

## 2023-12-30 LAB — GLUCOSE, CAPILLARY
Glucose-Capillary: 158 mg/dL — ABNORMAL HIGH (ref 70–99)
Glucose-Capillary: 126 mg/dL — ABNORMAL HIGH (ref 70–99)
Glucose-Capillary: 95 mg/dL (ref 70–99)
Glucose-Capillary: 144 mg/dL — ABNORMAL HIGH (ref 70–99)

## 2023-12-30 MED ORDER — IPRATROPIUM-ALBUTEROL 0.5-2.5 (3) MG/3ML IN SOLN
3.0000 mL | Freq: Four times a day (QID) | RESPIRATORY_TRACT | Status: DC | PRN
Start: 2023-12-30 — End: 2024-01-04
  Administered 2024-01-02 – 2024-01-03 (×2): 3 mL via RESPIRATORY_TRACT
  Filled 2023-12-30 (×2): qty 3

## 2023-12-30 NOTE — Progress Notes (Signed)
 PROGRESS NOTE   Subjective/Complaints: Pt admits to severe dizziness with BP 80's systolic yesterday- admits he thought it was the ataxia at first- but now realizes having dizziness/light headedness.   Ate 100% tray, not all fluids on tray.   Coughing slightly bette-r hit or miss now Thrush 50% better.  Said at home usually BP 140-150s systolic, but lower here.  LBM 2 days ago.   Wants nebs machine at home ROS:   Pt denies SOB, abd pain, CP, N/V/C/D, and vision changes   Thrush- significant (+)- only mild improvement Pain- still an issue Constipation resolved   Objective:   No results found.   Recent Labs    12/28/23 0630  WBC 8.0  HGB 12.0*  HCT 36.0*  PLT 311   Recent Labs    12/29/23 0638 12/30/23 0548  NA 132* 134*  K 4.2 4.2  CL 105 104  CO2 20* 21*  GLUCOSE 107* 140*  BUN 30* 29*  CREATININE 1.41* 1.48*  CALCIUM 8.6* 8.9    Intake/Output Summary (Last 24 hours) at 12/30/2023 1301 Last data filed at 12/30/2023 0853 Gross per 24 hour  Intake 1078 ml  Output 1300 ml  Net -222 ml        Physical Exam: Vital Signs Blood pressure 112/84, pulse 77, temperature 98 F (36.7 C), resp. rate 16, height 5\' 9"  (1.753 m), weight 89.5 kg, SpO2 95%.        General: awake, alert, appropriate, sitting up in bed; NAD HENT: conjugate gaze; oropharynx moist- nasal abrasions CV: regular rate and rhythm; no JVD Pulmonary: CTA B/L; no W/R/R- good air movement GI: soft, NT, ND, (+)BS- slightly hypoactive Psychiatric: appropriate Neurological: Ox3   Less dysarthric this AM-  Neurologic: Cranial nerves II through XII intact, motor strength is 5/5 in RIght  deltoid, bicep, tricep, grip,5/5 right and 4/5 left  hip flexor, knee extensors, ankle dorsiflexor and plantar flexor Left grip 4/5 Sensory exam normal sensation to light touch in bilateral upper and lower extremities Cerebellar exam normal finger to  nose to finger as well as heel to shin in right upper and lower extremities, LUE NT due to sling, LLE moderate ataxia with heel shin  Assessment/Plan: 1. Functional deficits which require 3+ hours per day of interdisciplinary therapy in a comprehensive inpatient rehab setting. Physiatrist is providing close team supervision and 24 hour management of active medical problems listed below. Physiatrist and rehab team continue to assess barriers to discharge/monitor patient progress toward functional and medical goals  Care Tool:  Bathing              Bathing assist Assist Level: Maximal Assistance - Patient 24 - 49%     Upper Body Dressing/Undressing Upper body dressing   What is the patient wearing?: Pull over shirt    Upper body assist Assist Level: Maximal Assistance - Patient 25 - 49%    Lower Body Dressing/Undressing Lower body dressing            Lower body assist Assist for lower body dressing: Maximal Assistance - Patient 25 - 49%     Toileting Toileting    Toileting assist Assist for toileting: Maximal Assistance -  Patient 25 - 49%     Transfers Chair/bed transfer  Transfers assist     Chair/bed transfer assist level: Moderate Assistance - Patient 50 - 74%     Locomotion Ambulation   Ambulation assist      Assist level: Minimal Assistance - Patient > 75% Assistive device: Walker-hemi Max distance: 25   Walk 10 feet activity   Assist     Assist level: Minimal Assistance - Patient > 75% Assistive device: Walker-hemi   Walk 50 feet activity   Assist Walk 50 feet with 2 turns activity did not occur: Safety/medical concerns         Walk 150 feet activity   Assist Walk 150 feet activity did not occur: Safety/medical concerns         Walk 10 feet on uneven surface  activity   Assist     Assist level: Moderate Assistance - Patient - 50 - 74% Assistive device: Education administrator Is the patient using a  wheelchair?: Yes Type of Wheelchair: Manual    Wheelchair assist level: Supervision/Verbal cueing Max wheelchair distance: 200+    Wheelchair 50 feet with 2 turns activity    Assist        Assist Level: Supervision/Verbal cueing   Wheelchair 150 feet activity     Assist      Assist Level: Supervision/Verbal cueing   Blood pressure 112/84, pulse 77, temperature 98 F (36.7 C), resp. rate 16, height 5\' 9"  (1.753 m), weight 89.5 kg, SpO2 95%.  Medical Problem List and Plan: 1. Functional deficits secondary to TBI             -patient may  shower             -ELOS/Goals: 14-18d Sup  D/c TBD  Con't CIR PT, OT and SLP for swallowing   2.  Antithrombotics: -DVT/anticoagulation:  Mechanical: Sequential compression devices, below knee Bilateral lower extremities             -antiplatelet therapy: N/A--Plavix to be held for a week per Dr. Jordan Likes --Resume on 03/04 3/8- restarted 3/4 3. Pain Management:  On IV hydromorphone and Oxycodone prn.              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 Mood/Behavior/Sleep: LCSW to follow for evaluation and support.              -antipsychotic agents: N/A   5. Neuropsych/cognition: This patient is capable of making decisions on his own behalf. 6. Skin/Wound Care: Routine pressure measures 7. Fluids/Electrolytes/Nutrition: Monitor I/O. Check CMET in am 8.  Left shoulder dislocation: NWB with sling X 2 weeks. Follow up with ortho in 2 weeks.  Dr Ave Filter did L RTC repair several years ago 9.  Nasal Fracture: Continue Keflex thru 03/05-->follow up with ENT in a week 10.  Pneumonia- Left chest wall  pain/SOB: Wean oxygen. Encourage pulmonary hygiene  3/4- wasn't wearing this AM  3/8- off O2 11. Chronic HFrEF: Compensated. Daily weight and strict I/O             --  ON Jardiance, Metoprolol, Pravastatin, Spironolactone, Entresto  3/4- ordered daily weights-will monitor for trend  3/7- weight down ~ 0.8 kg- off jardiance now will monitor  3/8- weight stable- con't to monitor 12. Anxiety d/o: Continue Klonopin prn 13. Insomnia: On Restoril 30 mg/hs 14. Wheezing: Hx smoking plus not taking deep breaths due to left rib painFlutter valve qid for pulmonary hygiene. Will schedule  duo nebs qid for now.   15.  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 Thrush  3/4- will give Diflucan 200 mg x1 and then 100 mg daily x 6 days- might need to increase dose if doesn't improve.   3/5- will add Magic mouthwash with lidocaine- QID- for 7 days  3/7- Increased Diflucan to 200mg  yesterday- 15% better now- con't regimen  3/8- is 50% better- con't regimen 17. Constipation  3/4- will give Sorbitol 30 mg x1 to get him to have BM after therapy- has been 3 days.   3/5- had good BM- con't regimen  3/7- will give Sorbitol since it's been 3 days on Senna 2 tabs BID and Miralax- con't regimen otherwise  3/8- LBM 2 days ago- if no results by tomorrow, will give Sorbitol 18. AKI  3/4- will recheck labs Thursday since with HF, Everardo't want him to get too wet.   3/5- asked pt to drink a little more than currently drinking  3/6- Pt's BUN up to 33 and Cr up to 1.50- think it's because hurts to drink/eat, so will give some IVFs to tune back up- usually Cr <1 and is now 1.5- will give 75cc/hour starting after therapy and do for 15 hours- won't do more, since has HF.   3/7- pt refused IVFs, but did drink for [name] yesterday- Cr down to 1.41- so it's down enough, won't do IVFs today since better.  BUN down to 30 from 33- of note, stopped  jardiance-   3/8- Cr 1.48- will push fluids again and check in AM- and might still need IVFs 19. Wet cough  3/6- Will increase Tessalon to 200 mg TID scheduled  -independently reviewed CXR- Yonis't see pneumonia- however will wait for final read  3/7- sounds a little more junky this Am- up coughing yesterday, however CXR was clear- will monitor Sx's and recheck CXR if needed  3/8- sounds much better today- cough is somewhat better per pt 20. Orthostatic hypotension  3/8- will try TEDs and abd binder and ACE wraps- if doesn't improve, will add Midodrine.   Will need nebs machine at home if possible due to pneumonia.    I spent a total of 41   minutes on total care today- >50% coordination of care- due to  D/w PA and therapy about low BP/OH- and nursing.    LOS: 5 days A FACE TO FACE EVALUATION WAS PERFORMED  Dustin Sampson 12/30/2023, 1:01 PM

## 2023-12-30 NOTE — Plan of Care (Signed)
  Problem: RH SKIN INTEGRITY Goal: RH STG SKIN FREE OF INFECTION/BREAKDOWN Description:  Manage skin free of infection/ breakdown  integrity with supervision   Outcome: Progressing   Problem: RH SAFETY Goal: RH STG ADHERE TO SAFETY PRECAUTIONS W/ASSISTANCE/DEVICE Description: STG Adhere to Safety Precautions With supervision Assistance/Device. Outcome: Progressing   Problem: RH PAIN MANAGEMENT Goal: RH STG PAIN MANAGED AT OR BELOW PT'S PAIN GOAL Description: <4 w/ prns Outcome: Progressing   Problem: RH KNOWLEDGE DEFICIT GENERAL Goal: RH STG INCREASE KNOWLEDGE OF SELF CARE AFTER HOSPITALIZATION Description: Manage increase knowledge of self care after hospitalization with supervision using educational materials prrovided Outcome: Progressing   Problem: RH BLADDER ELIMINATION Goal: RH STG MANAGE BLADDER WITH ASSISTANCE Description: STG Manage Bladder With supervision  Assistance Outcome: Adequate for Discharge

## 2023-12-30 NOTE — Progress Notes (Signed)
 Occupational Therapy Session Note  Patient Details  Name: Dustin Sampson MRN: [id number] Date of Birth: 06/18/1966  Today's Date: 12/30/2023 OT Individual Time: 8657-8469 OT Individual Time Calculation (min): 26 min    Short Term Goals: Week 1:  OT Short Term Goal 1 (Week 1): Pt will complete static standing activity with Min A with LRAD and no LOB OT Short Term Goal 2 (Week 1): Pt will complete LB dressing with Mod A with AE as necessary OT Short Term Goal 3 (Week 1): Pt will complete toileting at Mod A with LRAD  Skilled Therapeutic Interventions/Progress Updates:   Pt greeted resting in bed for skilled OT session with focus on BADL participation. Pt with reports of 6/10 pain in L-shoulder as well as L-sided ribs, OT providing rest-breaks as needed. Pt comes to EOB with supervision + use of bed rail, discussed these are not available at home. Pt requires total A for management of shoulder sling to change t-shirt at Min A level for LUE. Pt brushes teeth at EOB with setup, increased difficulty with upright posture noted. Educated on need to doff B TEDs at nighttime, receptive, and max A provided to doff as this was patient's only session this date. Pt politely requesting to return to bed due to fatigue, 4Ps assessed and immediate needs met. Pt continues to be appropriate for skilled OT intervention to promote further functional independence in ADLs/IADLs.   Therapy Documentation Precautions:  Precautions Precautions: Fall Shoulder Interventions: Shoulder sling/immobilizer Precaution/Restrictions Comments: ataxia Required Braces or Orthoses: Sling (Lt shoulder) Restrictions Weight Bearing Restrictions Per Provider Order: Yes LUE Weight Bearing Per Provider Order: Non weight bearing Other Position/Activity Restrictions: sling for 2 weeks until ortho follow-up   Therapy/Group: Individual Therapy  Lou Cal, OTR/L, MSOT  12/30/2023, 6:27 AM

## 2023-12-30 NOTE — Plan of Care (Signed)
  Problem: Consults Goal: RH GENERAL PATIENT EDUCATION Description: See Patient Education module for education specifics. Outcome: Progressing   Problem: RH BOWEL ELIMINATION Goal: RH STG MANAGE BOWEL WITH ASSISTANCE Description: STG Manage Bowel with  supervision Assistance. Outcome: Progressing   Problem: RH BOWEL ELIMINATION Goal: RH STG MANAGE BOWEL W/MEDICATION W/ASSISTANCE Description: STG Manage Bowel with Medication with  supervision Assistance. Outcome: Progressing   Problem: RH BLADDER ELIMINATION Goal: RH STG MANAGE BLADDER WITH ASSISTANCE Description: STG Manage Bladder With supervision  Assistance Outcome: Progressing   Problem: RH SKIN INTEGRITY Goal: RH STG SKIN FREE OF INFECTION/BREAKDOWN Description:  Manage skin free of infection/ breakdown  integrity with supervision   Outcome: Progressing   Problem: RH SAFETY Goal: RH STG ADHERE TO SAFETY PRECAUTIONS W/ASSISTANCE/DEVICE Description: STG Adhere to Safety Precautions With supervision Assistance/Device. Outcome: Progressing   Problem: RH KNOWLEDGE DEFICIT GENERAL Goal: RH STG INCREASE KNOWLEDGE OF SELF CARE AFTER HOSPITALIZATION Description: Manage increase knowledge of self care after hospitalization with supervision using educational materials prrovided Outcome: Progressing   Problem: RH PAIN MANAGEMENT Goal: RH STG PAIN MANAGED AT OR BELOW PT'S PAIN GOAL Description: <4 w/ prns Outcome: Progressing

## 2023-12-31 DIAGNOSIS — S065XAA Traumatic subdural hemorrhage with loss of consciousness status unknown, initial encounter: Secondary | ICD-10-CM | POA: Diagnosis not present

## 2023-12-31 LAB — BASIC METABOLIC PANEL
Anion gap: 10 (ref 5–15)
BUN: 26 mg/dL — ABNORMAL HIGH (ref 6–20)
CO2: 22 mmol/L (ref 22–32)
Calcium: 9.1 mg/dL (ref 8.9–10.3)
Chloride: 100 mmol/L (ref 98–111)
Creatinine, Ser: 1.46 mg/dL — ABNORMAL HIGH (ref 0.61–1.24)
GFR, Estimated: 55 mL/min — ABNORMAL LOW (ref >60)
Glucose, Bld: 173 mg/dL — ABNORMAL HIGH (ref 70–99)
Potassium: 4.4 mmol/L (ref 3.5–5.1)
Sodium: 132 mmol/L — ABNORMAL LOW (ref 135–145)

## 2023-12-31 LAB — GLUCOSE, CAPILLARY
Glucose-Capillary: 101 mg/dL — ABNORMAL HIGH (ref 70–99)
Glucose-Capillary: 106 mg/dL — ABNORMAL HIGH (ref 70–99)
Glucose-Capillary: 117 mg/dL — ABNORMAL HIGH (ref 70–99)
Glucose-Capillary: 122 mg/dL — ABNORMAL HIGH (ref 70–99)
Glucose-Capillary: 130 mg/dL — ABNORMAL HIGH (ref 70–99)

## 2023-12-31 MED ORDER — SORBITOL 70 % SOLN: 30.0000 mL | Freq: Once | Status: DC

## 2023-12-31 MED ORDER — LIDOCAINE 5 % EX PTCH
3.0000 | MEDICATED_PATCH | CUTANEOUS | Status: DC
  Administered 2024-01-01 – 2024-01-08 (×7): 3 via TRANSDERMAL
  Filled 2023-12-31 (×9): qty 3

## 2023-12-31 NOTE — Progress Notes (Signed)
 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 SLP Short Term Goal 2 (Week 1): Patient will recall 4 compensatory speech intelligibility strategeis with 100% acc with sup A. SLP Short Term Goal 3 (Week 1): Patient wlll consume thin liquids without s/sx pen/asp in 80% opportunites with sup A SLP Short Term Goal 4 (Week 1): Patient will recall safe swallwoing strategies with 100% acc and sup A SLP Short Term Goal 5 (Week 1): Pt will complete MBSS in order to r/o aspiration.  Skilled Therapeutic Interventions:  Pt lying in bed upon SLP arrival. Reported 5/10 pain in left rib cage and stated that he felt he "tweaked" his neck during shower earlier today. SLP alerted RN who provided medications during session.   SLP facilitated therapy targeting dysphagia and memory. Speech intelligibility was 100% at conversational level (no ataxia noted this session). During trials of thin liquids via straw, pt had no overt s/sx of aspiration. Pt recalled safe swallow strategy to use small sips independently but was unable to recall others. SLP reeducated him on precautions and he was able to recall 75% up to 20 minutes when provided min A. Extensive education was provided on the increased risk for aspiration and asp PNA as well as the importance of thorough oral care during ataxic episodes. Discussed s/sx of asp PNA to monitor for including fever, SOB, and congestion, especially following a known aspiration event. Pt able to recall these s/sx with 67% acc independently with delayed recall of approximately 5 minutes, improved to 100% acc when provided repetition, rehearsal, and  teach back. Pt denies STM deficits since SDH but reports "brain fog" and reduced attention. Baseline brain fog d/t previous cancer treatment reported as well.   Pt left in bed with alarm set and call bell present. Continue ST POC.    Pain Pain Assessment Pain Scale: 0-10 Pain Score: 5  Faces Pain Scale: No hurt Pain Type: Acute pain Pain Location: Rib cage Pain Orientation: Left Pain Intervention(s): Medication (See eMAR);Repositioned;Rest  Therapy/Group: Individual Therapy  Alphonsus Sias 12/31/2023, 4:02 PM

## 2023-12-31 NOTE — Progress Notes (Signed)
 PROGRESS NOTE   Subjective/Complaints:  Pt reports still some dizziness- however per PT that discussed pt with me, his dizziness more appears to be due to double vision  -patched 1 eye and better- didn't also need abd binder- did OK with TEDs and ACE wraps. Also, no drop in BP  Also reports rib pain is the worst part.  Admits didn't drink well yesterday.  Slept weird and having more pain.  Sat in chair 1 hour this AM.   Eating better because thrush getting better daily.  Wants nebs machine at home ROS:   Pt denies SOB, abd pain, CP, N/V/C/D, and vision changes    Thrush- significant (+)- only mild improvement Pain- still an issue Constipation resolved   Objective:   No results found.   No results for input(s): "WBC", "HGB", "HCT", "PLT" in the last 72 hours.  Recent Labs    12/30/23 0548 12/31/23 0744  NA 134* 132*  K 4.2 4.4  CL 104 100  CO2 21* 22  GLUCOSE 140* 173*  BUN 29* 26*  CREATININE 1.48* 1.46*  CALCIUM 8.9 9.1    Intake/Output Summary (Last 24 hours) at 12/31/2023 1645 Last data filed at 12/31/2023 0900 Gross per 24 hour  Intake 720 ml  Output 580 ml  Net 140 ml        Physical Exam: Vital Signs Blood pressure 107/67, pulse 75, temperature 97.6 F (36.4 C), temperature source Oral, resp. rate 16, height 5\' 9"  (1.753 m), weight 90.4 kg, SpO2 97%.      General: awake, alert, appropriate, sitting up in bed; NAD HENT: conjugate gaze; oropharynx moist- nasal abrasion CV: regular rate and rhythm; no JVD Pulmonary: CTA B/L; no W/R/R- good air movement GI: soft, NT, ND, (+)BS- hypoactive Psychiatric: appropriate Neurological: Ox3   Less dysarthric this AM-  Neurologic: Cranial nerves II through XII intact, motor strength is 5/5 in RIght  deltoid, bicep, tricep, grip,5/5 right and 4/5 left  hip flexor, knee extensors, ankle dorsiflexor and plantar flexor Left grip 4/5 Sensory exam  normal sensation to light touch in bilateral upper and lower extremities Cerebellar exam normal finger to nose to finger as well as heel to shin in right upper and lower extremities, LUE NT due to sling, LLE moderate ataxia with heel shin  Assessment/Plan: 1. Functional deficits which require 3+ hours per day of interdisciplinary therapy in a comprehensive inpatient rehab setting. Physiatrist is providing close team supervision and 24 hour management of active medical problems listed below. Physiatrist and rehab team continue to assess barriers to discharge/monitor patient progress toward functional and medical goals  Care Tool:  Bathing              Bathing assist Assist Level: Maximal Assistance - Patient 24 - 49%     Upper Body Dressing/Undressing Upper body dressing   What is the patient wearing?: Pull over shirt    Upper body assist Assist Level: Maximal Assistance - Patient 25 - 49%    Lower Body Dressing/Undressing Lower body dressing            Lower body assist Assist for lower body dressing: Maximal Assistance - Patient 25 - 49%  Toileting Toileting    Toileting assist Assist for toileting: Maximal Assistance - Patient 25 - 49%     Transfers Chair/bed transfer  Transfers assist     Chair/bed transfer assist level: Moderate Assistance - Patient 50 - 74%     Locomotion Ambulation   Ambulation assist      Assist level: Minimal Assistance - Patient > 75% Assistive device: Walker-hemi Max distance: 25   Walk 10 feet activity   Assist     Assist level: Minimal Assistance - Patient > 75% Assistive device: Walker-hemi   Walk 50 feet activity   Assist Walk 50 feet with 2 turns activity did not occur: Safety/medical concerns         Walk 150 feet activity   Assist Walk 150 feet activity did not occur: Safety/medical concerns         Walk 10 feet on uneven surface  activity   Assist     Assist level: Moderate  Assistance - Patient - 50 - 74% Assistive device: Education administrator Is the patient using a wheelchair?: Yes Type of Wheelchair: Manual    Wheelchair assist level: Supervision/Verbal cueing Max wheelchair distance: 200+    Wheelchair 50 feet with 2 turns activity    Assist        Assist Level: Supervision/Verbal cueing   Wheelchair 150 feet activity     Assist      Assist Level: Supervision/Verbal cueing   Blood pressure 107/67, pulse 75, temperature 97.6 F (36.4 C), temperature source Oral, resp. rate 16, height 5\' 9"  (1.753 m), weight 90.4 kg, SpO2 97%.  Medical Problem List and Plan: 1. 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  Antithrombotics: -DVT/anticoagulation:  Mechanical: Sequential compression devices, below knee Bilateral lower extremities             -antiplatelet therapy: N/A--Plavix to be held for a week per Dr. Jordan Likes --Resume on 03/04 3/8- restarted 3/4 3. Pain Management:  On IV hydromorphone and Oxycodone prn.              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 Mood/Behavior/Sleep: LCSW to follow for evaluation and support.              -antipsychotic agents: N/A   5. Neuropsych/cognition: This patient is capable of making decisions on his own behalf. 6. Skin/Wound Care: Routine pressure measures 7. Fluids/Electrolytes/Nutrition: Monitor I/O. Check CMET in am 8.  Left shoulder dislocation: NWB with sling X 2 weeks.  Follow up with ortho in 2 weeks.  Dr Ave Filter did L RTC repair several years ago 9.  Nasal Fracture: Continue Keflex thru 03/05-->follow up with ENT in a week 10.  Pneumonia- Left chest wall pain/SOB: Wean oxygen. Encourage pulmonary hygiene  3/4- wasn't wearing this AM  3/8- off O2 11. Chronic HFrEF: Compensated. Daily weight and strict I/O             --ON Jardiance, Metoprolol, Pravastatin, Spironolactone, Entresto  3/4- ordered daily weights-will monitor for trend  3/7- weight down ~ 0.8 kg- off jardiance now will monitor  3/8- weight stable- con't to monitor  3/9- weight up 1 kg- will monitor trend 12. Anxiety d/o: Continue Klonopin prn 13. Insomnia: On Restoril 30 mg/hs 14. Wheezing: Hx smoking plus not taking deep breaths due to left rib painFlutter valve qid for pulmonary hygiene. Will schedule  duo nebs qid for now.   15.  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 Thrush  3/4- will give Diflucan 200 mg x1 and then 100 mg daily x 6 days- might need to increase dose if doesn't improve.   3/5- will add Magic mouthwash with lidocaine- QID- for 7 days  3/7- Increased Diflucan to 200mg  yesterday- 15% better now- con't regimen  3/8- is 50% better- con't regimen 17. Constipation  3/4- will give Sorbitol 30 mg x1 to get him to have BM after therapy- has been 3 days.   3/5- had good BM- con't regimen  3/7- will give Sorbitol since it's been 3 days on Senna 2 tabs BID and Miralax- con't regimen otherwise  3/8- LBM 2 days ago- if no results by tomorrow, will give Sorbitol  3/9- NO BM seen- will order Sorbitol 18. AKI  3/4- will recheck labs Thursday since with HF, Mica't want him to get too wet.   3/5- asked pt to drink a little more than currently drinking  3/6- Pt's BUN up to 33 and Cr up to 1.50- think it's because hurts to drink/eat, so will give some IVFs to tune back up- usually Cr <1 and is now 1.5-  will give 75cc/hour starting after therapy and do for 15 hours- won't do more, since has HF.   3/7- pt refused IVFs, but did drink for [name] yesterday- Cr down to 1.41- so it's down enough, won't do IVFs today since better.  BUN down to 30 from 33- of note, stopped jardiance-   3/8- Cr 1.48- will push fluids again  and check in AM- and might still need IVFs  3/9- admits not drinking great- push PO fluids- doesn't want IVFs 19. Wet cough  3/6- Will increase Tessalon to 200 mg TID scheduled  -independently reviewed CXR- Zade't see pneumonia- however will wait for final read  3/7- sounds a little more junky this Am- up coughing yesterday, however CXR was clear- will monitor Sx's and recheck CXR if needed  3/8- sounds much better today- cough is somewhat better per pt 20. Orthostatic hypotension vs dizziness from double vision  3/8- will try TEDs and abd binder and ACE wraps- if doesn't improve, will add Midodrine.   3/9- patch for 1 eye helping as well as TEDs/ACE wraps- not tried abd binder yet   Will need nebs machine at home if possible due to pneumonia.     I spent a total of 45   minutes on total care today- >50% coordination of care- due to d/w PA, PT and nursing- and review of chart, vitals, and bowels    LOS: 6 days A FACE TO FACE EVALUATION WAS PERFORMED  Leonarda Leis 12/31/2023, 4:45 PM

## 2023-12-31 NOTE — Plan of Care (Signed)
  Problem: Consults Goal: RH GENERAL PATIENT EDUCATION Description: See Patient Education module for education specifics. Outcome: Progressing   Problem: RH BOWEL ELIMINATION Goal: RH STG MANAGE BOWEL WITH ASSISTANCE Description: STG Manage Bowel with  supervision Assistance. Outcome: Progressing Goal: RH STG MANAGE BOWEL W/MEDICATION W/ASSISTANCE Description: STG Manage Bowel with Medication with  supervision Assistance. Outcome: Progressing

## 2023-12-31 NOTE — Progress Notes (Signed)
 Occupational Therapy Session Note  Patient Details  Name: Dustin Sampson MRN: [id number] Date of Birth: 10-02-1966  Today's Date: 12/31/2023 OT Individual Time: 1100-1200 OT Individual Time Calculation (min): 60 min    Short Term Goals: Week 1:  OT Short Term Goal 1 (Week 1): Pt will complete static standing activity with Min A with LRAD and no LOB OT Short Term Goal 2 (Week 1): Pt will complete LB dressing with Mod A with AE as necessary OT Short Term Goal 3 (Week 1): Pt will complete toileting at Mod A with LRAD  Skilled Therapeutic Interventions/Progress Updates:      Therapy Documentation Precautions:  Precautions Precautions: Fall Shoulder Interventions: Shoulder sling/immobilizer Precaution/Restrictions Comments: ataxia Required Braces or Orthoses: Sling (Lt shoulder) Restrictions Weight Bearing Restrictions Per Provider Order: Yes (left arm shoulder) LUE Weight Bearing Per Provider Order: Non weight bearing Other Position/Activity Restrictions: sling for 2 weeks until ortho follow-up General: "I am more tired than usual this morning" Pt seated in W/C upon OT arrival, agreeable to OT. Pt initially increased slurred speech and ataxia, by end of session, decreased back to baseline.  Pain: no pain reported  ADL: Pt completed ADL this date in order to increase independence with tasks as well as increase activity tolerance. Pt completed the following tasks at levels of assist listed:  Toilet transfer: stand pivot W/C>standing to urinate, Pt requiring initially Min A to pivot using grab bar, fading to CGA for static standing Toileting: CGA for stability, able to manage pants over waist, unable to void this AM, pt reported, "this happens sometimes when I am stressed" UB dressing: total A for changing UB shoulder sling, SBA doffing/donning overhead shirt using RUE LB dressing: standing unsupported at CGA using RUE to manage pants over waist and down leg Footwear: total A for doffing  ACE wraps and TED hose Shower transfer: CGA using grab bars, stand pivot transfer from W/C><TTB Bathing: SBA, able to manipulate soap bottles this AM compared to previous shower   Pt supine in bed with bed alarm activated, 2 bed rails up, call light within reach and 4Ps assessed. Shoulder positioned in bed with pillows.   Therapy/Group: Individual Therapy  Velia Meyer, OTD, OTR/L 12/31/2023, 12:23 PM

## 2023-12-31 NOTE — Progress Notes (Signed)
 Physical Therapy Session Note  Patient Details  Name: Dustin Sampson MRN: [id number] Date of Birth: Aug 21, 1966  Today's Date: 12/31/2023 PT Individual Time: 0845-1000 PT Individual Time Calculation (min): 75 min   Short Term Goals: Week 1:  PT Short Term Goal 1 (Week 1): Pt will transfer sit to stand w/ CGA consistently safely. PT Short Term Goal 2 (Week 1): Pt will transfer w/c <> bed w/ CGA. PT Short Term Goal 3 (Week 1): Pt will amb w/ LRAD and CGA x 75'  Skilled Therapeutic Interventions/Progress Updates:      Therapy Documentation Precautions:  Precautions Precautions: Fall Shoulder Interventions: Shoulder sling/immobilizer Precaution/Restrictions Comments: ataxia Required Braces or Orthoses: Sling (Lt shoulder) Restrictions Weight Bearing Restrictions Per Provider Order: Yes LUE Weight Bearing Per Provider Order: Non weight bearing Other Position/Activity Restrictions: sling for 2 weeks until ortho follow-up  Pt agreeable to PT session with emphasis on pt education, global strength/conditioning, and gait training. Pt without verbal reports of pain. Pt negative for orthostatic hypotension with use of thigh high ted hose and ace wrapping.   Supine: 114/87 (97), Pulse 81, O2 97%  Sitting: 120/89 (96), pulse 95, O2 98%  Standing x 1 min: 111/93 (99), pulse 93, O2 98%   Standing x 2 min: 130/110 (110), pulse 93, O2 98%   Pt supervision with supine > sit with HOB raised and increased time. Requires min A with stand pivot with hemiwalker with mod cues for sequencing.   Dependent transport for time to dayroom, pt engaged in kinetron from w/c at 20 cm/sec 4 x 2 min with 1 min rests between each bout to warm up legs and for improved circulation.   Transitioned to gait training with music for pacing and healthy distraction as pt ambulated ~25' x 2 with bilateral 4# ankle weights and hemi walker min A. Pt requires min tactile feedback and able to correct posterior bias.   Pt reports  double vision resulting in dizziness therefore PT provided pt with glasses's and taped left lens to occlude vision, improved gait step length and sequencing with intervention.   Pt returned to room and left seated at bedside all needs in reach, alarm on.   Therapy/Group: Individual Therapy  Truitt Leep Truitt Leep PT, DPT  12/31/2023, 10:21 AM

## 2024-01-01 ENCOUNTER — Inpatient Hospital Stay (HOSPITAL_COMMUNITY)

## 2024-01-01 DIAGNOSIS — S065XAA Traumatic subdural hemorrhage with loss of consciousness status unknown, initial encounter: Secondary | ICD-10-CM | POA: Diagnosis not present

## 2024-01-01 DIAGNOSIS — R42 Dizziness and giddiness: Secondary | ICD-10-CM | POA: Diagnosis not present

## 2024-01-01 DIAGNOSIS — G119 Hereditary ataxia, unspecified: Secondary | ICD-10-CM | POA: Diagnosis not present

## 2024-01-01 LAB — CBC
HCT: 35.9 % — ABNORMAL LOW (ref 39.0–52.0)
Hemoglobin: 11.9 g/dL — ABNORMAL LOW (ref 13.0–17.0)
MCH: 28.5 pg (ref 26.0–34.0)
MCHC: 33.1 g/dL (ref 30.0–36.0)
MCV: 86.1 fL (ref 80.0–100.0)
Platelets: 317 10*3/uL (ref 150–400)
RBC: 4.17 MIL/uL — ABNORMAL LOW (ref 4.22–5.81)
RDW: 13.8 % (ref 11.5–15.5)
WBC: 7.9 10*3/uL (ref 4.0–10.5)
nRBC: 0 % (ref 0.0–0.2)

## 2024-01-01 LAB — BASIC METABOLIC PANEL
Anion gap: 6 (ref 5–15)
BUN: 27 mg/dL — ABNORMAL HIGH (ref 6–20)
CO2: 22 mmol/L (ref 22–32)
Calcium: 8.9 mg/dL (ref 8.9–10.3)
Chloride: 104 mmol/L (ref 98–111)
Creatinine, Ser: 1.42 mg/dL — ABNORMAL HIGH (ref 0.61–1.24)
GFR, Estimated: 57 mL/min — ABNORMAL LOW (ref >60)
Glucose, Bld: 113 mg/dL — ABNORMAL HIGH (ref 70–99)
Potassium: 4.7 mmol/L (ref 3.5–5.1)
Sodium: 132 mmol/L — ABNORMAL LOW (ref 135–145)

## 2024-01-01 LAB — GLUCOSE, CAPILLARY
Glucose-Capillary: 120 mg/dL — ABNORMAL HIGH (ref 70–99)
Glucose-Capillary: 97 mg/dL (ref 70–99)
Glucose-Capillary: 103 mg/dL — ABNORMAL HIGH (ref 70–99)
Glucose-Capillary: 101 mg/dL — ABNORMAL HIGH (ref 70–99)

## 2024-01-01 MED ORDER — MELATONIN 3 MG PO TABS: 3.0000 mg | ORAL_TABLET | Freq: Every evening | ORAL | Status: DC | PRN

## 2024-01-01 NOTE — Procedures (Signed)
 Modified Barium Swallow Study  Patient Details  Name: Dustin Sampson MRN: [id number] Date of Birth: Sep 11, 1966  Today's Date: 01/01/2024  Modified Barium Swallow completed.  Full report located under Chart Review in the Imaging Section.  History of Present Illness Pt is a 58 y/o male with PMH of ataxia, anxiety, insomnia, CVA, hypothyroidism, HTN, PAD, HFrEF, and frequent falls who presented to [facility] on 12/19/23 following a fall at home.  He is on plavix and with obvious facial injury.  Imaging revealed open nasal bone fracture, L anterior shoulder dislocation, and SDH.  Neurosurgery consulted and recommended repeat imaging.  ED labs notable for WBC 12.6, Na 134, and glucose 192.  Shoulder reduced in the ED and pt to be NWB with sling.  Follow up CT was stable and neurosurgery recommended holding plavix.   Clinical Impression Patient presents with oropharyngeal swallowing function that is grossly WFL. No penetration/aspiration observed across all trials. Base of tongue retraction, epiglottic inversion, hyolaryngeal excursion/elevation, pharyngeal stripping wave, and UES distention all sufficient for full pharyngeal clearance and adequate airway protection. Recommend continuation of regular/thin liquid diet with medications administered whole with thin liquid. Patient endorses variable ataxia day to day and notes that on days were ataxia is more severe, he will cough when drinking thin liquids sequentially. Recommend that on days were ataxia is more significant, patient take small, slow sips and avoid straws, however given patient's intact cognition, patient is capable of judging when these modifications are indicated. SLP will continue to monitor swallowing function peripherally during motor speech therapy sessions. Factors that may increase risk of adverse event in presence of aspiration [name] 2021):    Swallow Evaluation Recommendations Recommendations: PO diet PO Diet Recommendation:  Regular;Thin liquids (Level 0) Liquid Administration via: Cup;Straw Medication Administration: Whole meds with liquid Supervision: Patient able to self-feed Oral care recommendations: Oral care BID (2x/day)    Jeannie Done, M.A., CCC-SLP   Dustin Sampson 01/01/2024,9:32 AM

## 2024-01-01 NOTE — Progress Notes (Signed)
 Physical Therapy Session Note  Patient Details  Name: Kalen Ratajczak MRN: [id number] Date of Birth: 11/15/1965  Today's Date: 01/01/2024 PT Individual Time: 1000-1045, 1300-1415  PT Individual Time Calculation (min): 45 min , 75 min   Short Term Goals: Week 1:  PT Short Term Goal 1 (Week 1): Pt will transfer sit to stand w/ CGA consistently safely. PT Short Term Goal 2 (Week 1): Pt will transfer w/c <> bed w/ CGA. PT Short Term Goal 3 (Week 1): Pt will amb w/ LRAD and CGA x 75'  Skilled Therapeutic Interventions/Progress Updates:      Therapy Documentation Precautions:  Precautions Precautions: Fall Shoulder Interventions: Shoulder sling/immobilizer Precaution/Restrictions Comments: ataxia Required Braces or Orthoses: Sling (Lt shoulder) Restrictions Weight Bearing Restrictions Per Provider Order: Yes LUE Weight Bearing Per Provider Order: Non weight bearing Other Position/Activity Restrictions: sling for 2 weeks until ortho follow-up  Treatment Session 1:   Pt agreeable to PT session with emphasis on gait training and without reports of pain in session. Pt wore bifocals in session and reports "less dizzy".   PT dependently donned thigh high ted hose, ace wraps, and abdominal binder for BP management at bed level. Supervision bed mobility from flat surface, CGA/min A with sit<>stand and stand pivot with hemiwalker to w/c.   Engaged in gait training 67' + 30' with hemiwalker while listening to music and wearing bilateral 4# ankle weights with multimodal cues for decreased step length and shortened base of support.   Continues to require min/mod A with stand>sit due to poor eccentric control. Pt returned to bed and left semi-reclined all needs in reach, alarm on.     Treatment Session 2:   Pt received semi-reclined in bed, agreeable to PT and without reports of pain. Supervision bed mobility, min A stand pivot to w/c and dependent transport to dayroom.   Pt reports SOB, nausea,  and dizziness and nurse notified and vitals assessed:   BP: 124/82 (96)  Pulse: 90   O2: 98  Pt reports increased anxiety and nurse provided medication, pt denies symptoms remainder of session.   Pt ambulated following distances with hemiwalker as PT provided verbal cues for wide base of support and excessive step length:   -37', no additional weight; min/mod ataxia   -32', 2# weighted ankle on hemi walker min A   -82', 2# weighted ankle on hemi walker min A   Pt returned to room and left semi-reclined in bed with all needs in reach, alarm on.   Therapy/Group: Individual Therapy  Truitt Leep Truitt Leep PT, DPT  01/01/2024, 12:13 PM

## 2024-01-01 NOTE — Progress Notes (Signed)
 Reached out to Dr. Veda Canning office for input on shoulder as 2 weeks out from injury. Spoke to PA who recommended TDWB LUE for balance, sling prn for comfort, no need for follow up films but to follow up in office after d/c.

## 2024-01-01 NOTE — Progress Notes (Addendum)
 PROGRESS NOTE   Subjective/Complaints:  Dizziness better with TEDs Wonders if shoulder is "in place"  no pain without movement  ROS:   Pt denies SOB, abd pain, CP, N/V/C/D, and vision changes Left sided rib pain   Objective:   No results found.   Recent Labs    01/01/24 0544  WBC 7.9  HGB 11.9*  HCT 35.9*  PLT 317    Recent Labs    12/31/23 0744 01/01/24 0544  NA 132* 132*  K 4.4 4.7  CL 100 104  CO2 22 22  GLUCOSE 173* 113*  BUN 26* 27*  CREATININE 1.46* 1.42*  CALCIUM 9.1 8.9    Intake/Output Summary (Last 24 hours) at 01/01/2024 0815 Last data filed at 01/01/2024 0519 Gross per 24 hour  Intake 696 ml  Output 1225 ml  Net -529 ml        Physical Exam: Vital Signs Blood pressure 116/78, pulse 83, temperature 97.8 F (36.6 C), resp. rate 16, height 5\' 9"  (1.753 m), weight 89.8 kg, SpO2 96%. Oral cavity is normal  General: No acute distress Mood and affect are appropriate Heart: Regular rate and rhythm no rubs murmurs or extra sounds Lungs: Clear to auscultation, breathing unlabored, no rales or wheezes Abdomen: Positive bowel sounds, soft nontender to palpation, nondistended Extremities: No clubbing, cyanosis, or edema Skin: No evidence of breakdown, no evidence of rash MSK- bilateral shoulders symmetric , no subluxation no pain to palpation  Less dysarthric this AM-  Neurologic: Cranial nerves II through XII intact, motor strength is 5/5 in RIght  deltoid, bicep, tricep, grip,5/5 right and 4/5 left  hip flexor, knee extensors, ankle dorsiflexor and plantar flexor Left grip 4/5   Assessment/Plan: 1. Functional deficits which require 3+ hours per day of interdisciplinary therapy in a comprehensive inpatient rehab setting. Physiatrist is providing close team supervision and 24 hour management of active medical problems listed below. Physiatrist and rehab team continue to assess barriers to  discharge/monitor patient progress toward functional and medical goals  Care Tool:  Bathing              Bathing assist Assist Level: Maximal Assistance - Patient 24 - 49%     Upper Body Dressing/Undressing Upper body dressing   What is the patient wearing?: Pull over shirt    Upper body assist Assist Level: Maximal Assistance - Patient 25 - 49%    Lower Body Dressing/Undressing Lower body dressing            Lower body assist Assist for lower body dressing: Maximal Assistance - Patient 25 - 49%     Toileting Toileting    Toileting assist Assist for toileting: Maximal Assistance - Patient 25 - 49%     Transfers Chair/bed transfer  Transfers assist     Chair/bed transfer assist level: Moderate Assistance - Patient 50 - 74%     Locomotion Ambulation   Ambulation assist      Assist level: Minimal Assistance - Patient > 75% Assistive device: Walker-hemi Max distance: 25   Walk 10 feet activity   Assist     Assist level: Minimal Assistance - Patient > 75% Assistive device: Walker-hemi  Walk 50 feet activity   Assist Walk 50 feet with 2 turns activity did not occur: Safety/medical concerns         Walk 150 feet activity   Assist Walk 150 feet activity did not occur: Safety/medical concerns         Walk 10 feet on uneven surface  activity   Assist     Assist level: Moderate Assistance - Patient - 50 - 74% Assistive device: Education administrator Is the patient using a wheelchair?: Yes Type of Wheelchair: Manual    Wheelchair assist level: Supervision/Verbal cueing Max wheelchair distance: 200+    Wheelchair 50 feet with 2 turns activity    Assist        Assist Level: Supervision/Verbal cueing   Wheelchair 150 feet activity     Assist      Assist Level: Supervision/Verbal cueing   Blood pressure 116/78, pulse 83, temperature 97.8 F (36.6 C), resp. rate 16, height 5\' 9"  (1.753 m),  weight 89.8 kg, SpO2 96%.  Medical Problem List and Plan: 1. Functional deficits secondary to TBI             -patient may  shower             -ELOS/Goals: 14-18d Sup Team conf in am   Con't CIR PT.,OT, SLP 2.  Antithrombotics: -DVT/anticoagulation:  Mechanical: Sequential compression devices, below knee Bilateral lower extremities             -antiplatelet therapy: N/A--Plavix to be held for a week per Dr. Jordan Likes --Resume on 03/04  3. Pain Management:  On IV hydromorphone and Oxycodone prn.              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 Mood/Behavior/Sleep: LCSW to follow for evaluation and support.              -antipsychotic agents: N/A   5. Neuropsych/cognition: This patient is capable of making decisions on his own behalf. 6. Skin/Wound Care: Routine pressure measures 7. Fluids/Electrolytes/Nutrition: Monitor I/O. Check CMET in am 8.  Left shoulder dislocation: NWB with sling X 2 weeks. Follow up with ortho in 2 weeks.  Dr Ave Filter did L RTC repair several years ago 9.  Nasal Fracture: Continue Keflex thru 03/05-->follow up with ENT in a week 10.  Pneumonia- Left chest wall pain/SOB: Wean oxygen. Encourage pulmonary hygiene  3/4- wasn't wearing this AM  3/8- off O2 11. Chronic HFrEF: Compensated. Daily weight and strict I/O             --ON Jardiance, Metoprolol, Pravastatin, Spironolactone, Entresto  3/4- ordered daily weights-will monitor for trend  3/7- weight down ~ 0.8 kg- off jardiance now will monitor  3/8- weight stable- con't to monitor  3/9- weight  up 1 kg- will monitor trend 12. Anxiety d/o: Continue Klonopin prn 13. Insomnia: On Restoril 30 mg/hs 14. Wheezing: Hx smoking plus not taking deep breaths due to left rib painFlutter valve qid for pulmonary hygiene.  Will schedule  duo nebs qid for now.   15.  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 Thrush  3/4- will give Diflucan 200 mg x1 and then 100 mg daily x 6 days- might need to increase dose if doesn't improve.   3/5- will add Magic mouthwash with lidocaine- QID- for 7 days  3/7- Increased Diflucan to 200mg  yesterday- 15% better now- con't regimen  No oral lesions, overall better  17. Constipation  3/4- will give Sorbitol 30 mg x1 to get him to have BM after therapy- has been 3 days.   3/5- had good BM- con't regimen  3/7- will give Sorbitol since it's been 3 days on Senna 2 tabs BID and Miralax- con't regimen otherwise  3/8- LBM 2 days ago- if no results by tomorrow, will give Sorbitol  3/9- BM x 2 18. AKI  3/4- will recheck labs Thursday since with HF, Isauro't want him to get too wet.   3/5- asked pt to drink a little more than currently drinking  3/6- Pt's BUN up to 33 and Cr up to 1.50- think it's because hurts to drink/eat, so will give some IVFs to tune back up- usually Cr <1 and is now 1.5- will give 75cc/hour starting after therapy and do for 15 hours- won't do more, since has HF.       Latest Ref Rng & Units 01/01/2024    5:44 AM 12/31/2023    7:44 AM 12/30/2023    5:48 AM  BMP  Glucose 70 - 99 mg/dL 130  865  784   BUN 6 - 20 mg/dL 27  26  29    Creatinine 0.61 - 1.24 mg/dL 6.96  2.95  2.84   Sodium 135 - 145 mmol/L 132  132  134   Potassium 3.5 - 5.1 mmol/L 4.7  4.4  4.2   Chloride 98 - 111 mmol/L 104  100  104   CO2 22 - 32 mmol/L 22  22  21    Calcium 8.9 - 10.3 mg/dL 8.9  9.1  8.9    Still above baseline of 1.1 but appears to be mainly hydration related , cont to push po  fluids 19. Wet cough  3/6- Will increase Tessalon to 200 mg TID scheduled  -independently reviewed CXR- Deward't see pneumonia- however will wait for final read   20. Orthostatic hypotension vs dizziness from double vision  3/8- will try TEDs and abd binder and ACE wraps- if doesn't improve, will add Midodrine.   3/9- patch for 1 eye helping as well as TEDs/ACE wraps- not tried abd binder yet Vitals:   12/31/23 1956 01/01/24 0521  BP: 113/81 116/78  Pulse: 94 83  Resp: 18 16  Temp: 97.7 F (36.5 C) 97.8 F (36.6 C)  SpO2: 95% 96%   Baseline BPs ok , no orthostatic changes per PT note on 3/9,   Will need nebs machine at home if possible due to pneumonia.      LOS: 7 days A FACE TO FACE EVALUATION WAS PERFORMED  Erick Colace 01/01/2024, 8:15 AM

## 2024-01-01 NOTE — Progress Notes (Signed)
 Occupational Therapy Session Note  Patient Details  Name: Dustin Sampson MRN: [id number] Date of Birth: [birth year]  Today's Date: 01/01/2024 OT Individual Time: 1100-1200 OT Individual Time Calculation (min): 60 min    Short Term Goals: Week 1:  OT Short Term Goal 1 (Week 1): Pt will complete static standing activity with Min A with LRAD and no LOB OT Short Term Goal 2 (Week 1): Pt will complete LB dressing with Mod A with AE as necessary OT Short Term Goal 3 (Week 1): Pt will complete toileting at Mod A with LRAD  Skilled Therapeutic Interventions/Progress Updates:      Therapy Documentation Precautions:  Precautions Precautions: Fall Shoulder Interventions: Shoulder sling/immobilizer Precaution/Restrictions Comments: ataxia Required Braces or Orthoses: Sling (Lt shoulder) Restrictions Weight Bearing Restrictions Per Provider Order: Yes LUE Weight Bearing Per Provider Order: Non weight bearing Other Position/Activity Restrictions: sling for 2 weeks until ortho follow-up General: "I'm wore out from this morning." Pt supine in bed upon OT arrival, agreeable to OT session.  Pain: no pain reported  ADL: Pt completed bed mobility at SBA   Balance: Pt completed a variety of standing activities in order to promote increased balance strategies with ADL participation. Pt completed all activities at Min A level for stability, intermittent unsupported standing at hemi cane. Pt completed exercises listed below: -Pt standing at [facility] system using RUE to reach in all planes out of BOS in order to increase dynamic standing and activity tolerance. Pt completed multiple trials of various activities with no LOB/SOB noted/reported   Exercises: Pt transferred from W/C><nu step machine at Min A with hemi cane with increased time for ataxia management and VC for balance. Pt completed 8 minutes of arm bike in order to increase BUE/BLEstrength and endurance in preparation for increased independence in ADLs  such as bathing. Rest break after 5 min, on level 2 resistance.   Pt supine in bed with bed alarm activated, 2 bed rails up, call light within reach and 4Ps assessed.   Therapy/Group: Individual Therapy  Velia Meyer, OTD, OTR/L 01/01/2024, 4:21 PM

## 2024-01-01 NOTE — Discharge Instructions (Addendum)
 Inpatient Rehab Discharge Instructions  Dustin Sampson Discharge date and time:  01/09/24  Activities/Precautions/ Functional Status: Activity: no lifting, driving, or strenuous exercise till cleared by MD Diet: cardiac diet and diabetic diet Wound Care: none needed   Functional status:  ___ No restrictions     ___ Walk up steps independently _X__ 24/7 supervision/assistance   ___ Walk up steps with assistance ___ Intermittent supervision/assistance  ___ Bathe/dress independently ___ Walk with walker     ___ Bathe/dress with assistance ___ Walk Independently    ___ Shower independently ___ Walk with assistance    _X__ Shower with assistance X___ No alcohol     ___ Return to work/school ________   Special Instructions: Touch down weight on Left arm. Use sling as needed for support/pain control.   COMMUNITY REFERRALS UPON DISCHARGE:    Home Health:   PT  &   OT                 Agency:[facility]    Phone:[phone]     Medical Equipment/Items Ordered:HEMI [facility]   [phone]                                                  POWER CHAIR-STALLS  [phone]    My questions have been answered and I understand these instructions. I will adhere to these goals and the provided educational materials after my discharge from the hospital.  Patient/Caregiver Signature _______________________________ Date __________  Clinician Signature _______________________________________ Date __________  Please bring this form and your medication list with you to all your follow-up doctor's appointments.

## 2024-01-02 DIAGNOSIS — S065XAA Traumatic subdural hemorrhage with loss of consciousness status unknown, initial encounter: Secondary | ICD-10-CM | POA: Diagnosis not present

## 2024-01-02 LAB — GLUCOSE, CAPILLARY
Glucose-Capillary: 118 mg/dL — ABNORMAL HIGH (ref 70–99)
Glucose-Capillary: 108 mg/dL — ABNORMAL HIGH (ref 70–99)
Glucose-Capillary: 108 mg/dL — ABNORMAL HIGH (ref 70–99)
Glucose-Capillary: 136 mg/dL — ABNORMAL HIGH (ref 70–99)

## 2024-01-02 MED ORDER — ALBUTEROL SULFATE (2.5 MG/3ML) 0.083% IN NEBU
2.5000 mg | INHALATION_SOLUTION | RESPIRATORY_TRACT | Status: DC | PRN
  Administered 2024-01-03: 2.5 mg via RESPIRATORY_TRACT
  Filled 2024-01-02: qty 3

## 2024-01-02 MED ORDER — APIXABAN 2.5 MG PO TABS
2.5000 mg | ORAL_TABLET | Freq: Two times a day (BID) | ORAL | Status: DC
  Administered 2024-01-02 – 2024-01-10 (×17): 2.5 mg via ORAL
  Filled 2024-01-02 (×17): qty 1

## 2024-01-02 MED ORDER — SACUBITRIL-VALSARTAN 24-26 MG PO TABS
1.0000 | ORAL_TABLET | Freq: Two times a day (BID) | ORAL | Status: DC
  Administered 2024-01-02 – 2024-01-05 (×7): 1 via ORAL
  Filled 2024-01-02 (×9): qty 1

## 2024-01-02 MED ORDER — GUAIFENESIN 100 MG/5ML PO LIQD
5.0000 mL | ORAL | Status: DC | PRN
  Administered 2024-01-04: 5 mL via ORAL
  Filled 2024-01-02: qty 5

## 2024-01-02 NOTE — Progress Notes (Signed)
 PROGRESS NOTE   Subjective/Complaints:  Pt reports drinking a lot- labs yesterday showed Cr 1.42.  Chewing nicorette for nicotine withdrawal.  LBM yesterday  Asking why not on Entresto- knew we had stopped Jardiance, but it appears Entresto was stopped due to Cr issues  Also reports cough got work yesterday afternoon- AM was fine, but afternoon, couldn't stop coughing.   ROS:   Pt denies SOB, abd pain, CP, N/V/C/D, and vision changes (+) Cough Left sided rib pain   Objective:   DG Swallowing Func-Speech Pathology Result Date: 01/01/2024 Table formatting from the original result was not included. Modified Barium Swallow Study Patient Details Name: Mclean Moya MRN: [id number] Date of Birth: 1966/07/24 Today's Date: 01/01/2024 HPI/PMH: HPI: Pt is a 58 y/o male with PMH of ataxia, anxiety, insomnia, CVA, hypothyroidism, HTN, PAD, HFrEF, and frequent falls who presented to [facility] on 12/19/23 following a fall at home.  He is on plavix and with obvious facial injury.  Imaging revealed open nasal bone fracture, L anterior shoulder dislocation, and SDH.  Neurosurgery consulted and recommended repeat imaging.  ED labs notable for WBC 12.6, Na 134, and glucose 192.  Shoulder reduced in the ED and pt to be NWB with sling.  Follow up CT was stable and neurosurgery recommended holding plavix. Clinical Impression: Clinical Impression: Patient presents with oropharyngeal swallowing function that is grossly WFL. No penetration/aspiration observed across all trials. Base of tongue retraction, epiglottic inversion, hyolaryngeal excursion/elevation, pharyngeal stripping wave, and UES distention all sufficient for full pharyngeal clearance and adequate airway protection. Recommend continuation of regular/thin liquid diet with medications administered whole with thin liquid. Patient endorses variable ataxia day to day and notes that on days were ataxia is more  severe, he will cough when drinking thin liquids sequentially. Recommend that on days were ataxia is more significant, patient take small, slow sips and avoid straws, however given patient's intact cognition, patient is capable of judging when these modifications are indicated. SLP will continue to monitor swallowing function peripherally during motor speech therapy sessions. Factors that may increase risk of adverse event in presence of aspiration [name] 2021): No data recorded Recommendations/Plan: Swallowing Evaluation Recommendations Swallowing Evaluation Recommendations Recommendations: PO diet PO Diet Recommendation: Regular; Thin liquids (Level 0) Liquid Administration via: Cup; Straw Medication Administration: Whole meds with liquid Supervision: Patient able to self-feed Oral care recommendations: Oral care BID (2x/day) Treatment Plan Treatment Plan Treatment recommendations: Therapy as outlined in treatment plan below Follow-up recommendations: No SLP follow up Functional status assessment: Patient has had a recent decline in their functional status and demonstrates the ability to make significant improvements in function in a reasonable and predictable amount of time. Treatment frequency: Min 1x/week Treatment duration: 1 week Interventions: Compensatory techniques; Patient/family education Recommendations Recommendations for follow up therapy are one component of a multi-disciplinary discharge planning process, led by the attending physician.  Recommendations may be updated based on patient status, additional functional criteria and insurance authorization. Assessment: Orofacial Exam: Orofacial Exam Oral Cavity: Oral Hygiene: WFL Oral Cavity - Dentition: Adequate natural dentition Orofacial Anatomy: WFL Oral Motor/Sensory Function: Other (comment) (ataxia) Anatomy: Anatomy: WFL Boluses Administered: Boluses Administered Boluses Administered: Thin liquids (  Level 0); Mildly thick liquids (Level  2, nectar thick); Moderately thick liquids (Level 3, honey thick); Puree; Solid  Oral Impairment Domain: Oral Impairment Domain Lip Closure: No labial escape Tongue control during bolus hold: Cohesive bolus between tongue to palatal seal Bolus preparation/mastication: Timely and efficient chewing and mashing Bolus transport/lingual motion: Brisk tongue motion Oral residue: Trace residue lining oral structures Location of oral residue : Tongue; Palate Initiation of pharyngeal swallow : Pyriform sinuses  Pharyngeal Impairment Domain: Pharyngeal Impairment Domain Soft palate elevation: No bolus between soft palate (SP)/pharyngeal wall (PW) Laryngeal elevation: Complete superior movement of thyroid cartilage with complete approximation of arytenoids to epiglottic petiole Anterior hyoid excursion: Complete anterior movement Epiglottic movement: Complete inversion Laryngeal vestibule closure: Complete, no air/contrast in laryngeal vestibule Pharyngeal stripping wave : Present - complete Pharyngeal contraction (A/P view only): N/A Pharyngoesophageal segment opening: Complete distension and complete duration, no obstruction of flow Tongue base retraction: No contrast between tongue base and posterior pharyngeal wall (PPW) Pharyngeal residue: Complete pharyngeal clearance  Esophageal Impairment Domain: No data recorded Pill: No data recorded Penetration/Aspiration Scale Score: Penetration/Aspiration Scale Score 1.  Material does not enter airway: Thin liquids (Level 0); Mildly thick liquids (Level 2, nectar thick); Moderately thick liquids (Level 3, honey thick); Puree; Solid Compensatory Strategies: Compensatory Strategies Compensatory strategies: No   General Information: Caregiver present: No  Diet Prior to this Study: Regular; Thin liquids (Level 0)   Temperature : Normal   Respiratory Status: WFL   Supplemental O2: None (Room air)   History of Recent Intubation: No  Behavior/Cognition: Alert; Cooperative; Pleasant mood  Self-Feeding Abilities: Able to self-feed Baseline vocal quality/speech: Normal Volitional Cough: Able to elicit Volitional Swallow: Able to elicit Exam Limitations: No limitations Goal Planning: Prognosis for improved oropharyngeal function: Good No data recorded No data recorded Patient/Family Stated Goal: none stated Consulted and agree with results and recommendations: Patient Pain: Pain Assessment Pain Assessment: 0-10 Pain Score: 3 Breathing: 0 Negative Vocalization: 0 Facial Expression: 0 Body Language: 0 Consolability: 0 PAINAD Score: 0 End of Session: Start Time:No data recorded Stop Time: No data recorded Time Calculation:No data recorded Charges: No data recorded SLP visit diagnosis: SLP Visit Diagnosis: Dysphagia, unspecified (R13.10) Past Medical History: Past Medical History: Diagnosis Date  Anxiety   Arthritis   Diabetes mellitus without complication (HCC)   Gout   Hyperlipemia   Hypertension   Hypothyroidism   Insomnia   Pneumonia   Stroke [facility])   Thyroid disease  Past Surgical History: Past Surgical History: Procedure Laterality Date  ABDOMINAL AORTOGRAM W/LOWER EXTREMITY Bilateral 08/21/2020  Procedure: ABDOMINAL AORTOGRAM W/LOWER EXTREMITY;  Surgeon: Sherren Kerns, MD;  Location: [facility] IN[facility]IVE [facility];  Service: Cardiovascular;  Laterality: Bilateral;  ABDOMINAL AORTOGRAM W/LOWER EXTREMITY N/A 09/04/2020  Procedure: ABDOMINAL AORTOGRAM W/LOWER EXTREMITY;  Surgeon: Sherren Kerns, MD;  Location: [facility];  Service: Cardiovascular;  Laterality: N/A;  COLONOSCOPY    x2  FEMORAL ARTERY EXPLORATION Left 09/04/2020  Procedure: FEMORAL ARTERY EXPLORATION;  Surgeon: Chuck Hint, MD;  Location: [facility] OR;  Service: Vascular;  Laterality: Left;  NASAL SINUS SURGERY    PERIPHERAL VASCULAR INTERVENTION Left 08/21/2020  Procedure: PERIPHERAL VASCULAR INTERVENTION;  Surgeon: Sherren Kerns, MD;  Location: [facility];  Service: Cardiovascular;  Laterality: Left;  sfa stent x 3   PERIPHERAL VASCULAR INTERVENTION  09/04/2020  Procedure: PERIPHERAL VASCULAR INTERVENTION;  Surgeon: Sherren Kerns, MD;  Location: [facility];  Service: Cardiovascular;;  Right SFA  RIGHT/LEFT HEART CATH AND CORONARY ANGIOGRAPHY N/A 11/16/2023  Procedure: RIGHT/LEFT HEART CATH AND CORONARY ANGIOGRAPHY;  Surgeon: Yates Decamp, MD;  Location: [facility];  Service: Cardiovascular;  Laterality: N/A;  SHOULDER ARTHROSCOPY WITH ROTATOR CUFF REPAIR AND SUBACROMIAL DECOMPRESSION Left 02/04/2021  Procedure: SHOULDER ARTHROSCOPY WITH ROTATOR CUFF REPAIR AND SUBACROMIAL DECOMPRESSION;  Surgeon: Jones Broom, MD;  Location: [facility] ORS;  Service: Orthopedics;  Laterality: Left;  WISDOM TOOTH EXTRACTION    WRIST SURGERY    bilat Jeannie Done, M.A., CCC-SLP Yetta Barre 01/01/2024, 9:33 AM    Recent Labs    01/01/24 0544  WBC 7.9  HGB 11.9*  HCT 35.9*  PLT 317    Recent Labs    12/31/23 0744 01/01/24 0544  NA 132* 132*  K 4.4 4.7  CL 100 104  CO2 22 22  GLUCOSE 173* 113*  BUN 26* 27*  CREATININE 1.46* 1.42*  CALCIUM 9.1 8.9    Intake/Output Summary (Last 24 hours) at 01/02/2024 0935 Last data filed at 01/02/2024 0858 Gross per 24 hour  Intake 676 ml  Output 2025 ml  Net -1349 ml        Physical Exam: Vital Signs Blood pressure 116/83, pulse 79, temperature 98 F (36.7 C), temperature source Oral, resp. rate 17, height 5\' 9"  (1.753 m), weight 93.7 kg, SpO2 94%.   General: awake, alert, appropriate, sitting up in bed; NAD HENT: conjugate gaze; oropharynx moist-nasal abrasion CV: regular rate and rhythm; no JVD Pulmonary: CTA B/L; no W/R/R- good air movement- but coarse sounding cough GI: soft, NT, ND, (+)BS Psychiatric: appropriate Neurological: Ox3 MSK- L shoulder in sling- less TTP; ribs extremely TTP Skin: No evidence of breakdown, no evidence of rash MSK- bilateral shoulders symmetric , no subluxation no pain to palpation  Less dysarthric this AM-  Neurologic:  Cranial nerves II through XII intact, motor strength is 5/5 in RIght  deltoid, bicep, tricep, grip,5/5 right and 4/5 left  hip flexor, knee extensors, ankle dorsiflexor and plantar flexor Left grip 4/5   Assessment/Plan: 1. Functional deficits which require 3+ hours per day of interdisciplinary therapy in a comprehensive inpatient rehab setting. Physiatrist is providing close team supervision and 24 hour management of active medical problems listed below. Physiatrist and rehab team continue to assess barriers to discharge/monitor patient progress toward functional and medical goals  Care Tool:  Bathing              Bathing assist Assist Level: Maximal Assistance - Patient 24 - 49%     Upper Body Dressing/Undressing Upper body dressing   What is the patient wearing?: Pull over shirt    Upper body assist Assist Level: Maximal Assistance - Patient 25 - 49%    Lower Body Dressing/Undressing Lower body dressing            Lower body assist Assist for lower body dressing: Maximal Assistance - Patient 25 - 49%     Toileting Toileting    Toileting assist Assist for toileting: Maximal Assistance - Patient 25 - 49%     Transfers Chair/bed transfer  Transfers assist     Chair/bed transfer assist level: Moderate Assistance - Patient 50 - 74%     Locomotion Ambulation   Ambulation assist      Assist level: Minimal Assistance - Patient > 75% Assistive device: Walker-hemi Max distance: 25   Walk 10 feet activity   Assist     Assist level: Minimal Assistance - Patient > 75% Assistive device: Walker-hemi  Walk 50 feet activity   Assist Walk 50 feet with 2 turns activity did not occur: Safety/medical concerns         Walk 150 feet activity   Assist Walk 150 feet activity did not occur: Safety/medical concerns         Walk 10 feet on uneven surface  activity   Assist     Assist level: Moderate Assistance - Patient - 50 - 74% Assistive  device: Education administrator Is the patient using a wheelchair?: Yes Type of Wheelchair: Manual    Wheelchair assist level: Supervision/Verbal cueing Max wheelchair distance: 200+    Wheelchair 50 feet with 2 turns activity    Assist        Assist Level: Supervision/Verbal cueing   Wheelchair 150 feet activity     Assist      Assist Level: Supervision/Verbal cueing   Blood pressure 116/83, pulse 79, temperature 98 F (36.7 C), temperature source Oral, resp. rate 17, height 5\' 9"  (1.753 m), weight 93.7 kg, SpO2 94%.  Medical Problem List and Plan: 1. Functional deficits secondary to TBI             -patient may  shower             -ELOS/Goals: 14-18d Sup  Con't CIR PT, OT and SLP for swallowing  Team conference today to f/u on progress 2.  Antithrombotics: -DVT/anticoagulation:  Mechanical: Sequential compression devices, below knee Bilateral lower extremities 3/11- will start Eliquis- 2.5 mg BID since doesn't want lovenox injections             -antiplatelet therapy: N/A--Plavix to be held for a week per Dr. Jordan Likes --Resume on 03/04  3. Pain Management:  On IV hydromorphone and Oxycodone prn.              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 Mood/Behavior/Sleep: LCSW to follow for evaluation and support.              -antipsychotic agents: N/A   5. Neuropsych/cognition: This patient is capable of making decisions on his own behalf. 6. Skin/Wound Care: Routine pressure measures 7. Fluids/Electrolytes/Nutrition: Monitor I/O.  Check CMET in am 8.  Left shoulder dislocation: NWB with sling X 2 weeks. Follow up with ortho in 2 weeks.  Dr Ave Filter did L RTC repair several years ago 9.  Nasal Fracture: Continue Keflex thru 03/05-->follow up with ENT in a week 10.  Pneumonia- Left chest wall pain/SOB: Wean oxygen. Encourage pulmonary hygiene  3/4- wasn't wearing this AM  3/8- off O2 11. Chronic HFrEF: Compensated. Daily weight and strict I/O             --ON Jardiance, Metoprolol, Pravastatin, Spironolactone, Entresto  3/4- ordered daily weights-will monitor for trend  3/7- weight down ~ 0.8 kg- off jardiance now will monitor  3/8- weight stable- con't to monitor  3/9- weight up 1 kg- will monitor trend  3/11- Weight up 6.5 lbs in 1 day-  will Restart Entresto, but not sure if weight his real weight- will recheck in AM 12. Anxiety d/o: Continue Klonopin prn 13. Insomnia: On Restoril 30 mg/hs 14. Wheezing: Hx smoking plus not taking deep breaths due to left rib painFlutter valve qid for pulmonary hygiene. Will schedule  duo nebs qid for now.   15.  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 Thrush  3/4- will give Diflucan 200 mg x1 and then 100 mg daily x 6 days- might need to increase dose if doesn't improve.   3/5- will add Magic mouthwash with lidocaine- QID- for 7 days  3/7- Increased Diflucan to 200mg  yesterday- 15% better now- con't regimen  No oral lesions, overall better  17. Constipation  3/4- will give Sorbitol 30 mg x1 to get him to have BM after therapy- has been 3 days.   3/5- had good BM- con't regimen  3/7- will give Sorbitol since it's been 3 days on Senna 2 tabs BID and Miralax- con't regimen otherwise  3/8- LBM 2 days ago- if no results by tomorrow, will give Sorbitol  3/9- BM x 2 18. AKI  3/4- will recheck labs Thursday since with HF, Waylyn't want him to get too wet.   3/5- asked pt to drink a little more than  currently drinking  3/6- Pt's BUN up to 33 and Cr up to 1.50- think it's because hurts to drink/eat, so will give some IVFs to tune back up- usually Cr <1 and is now 1.5- will give 75cc/hour starting after therapy and do for 15 hours- won't do more, since has HF.       Latest Ref Rng & Units 01/01/2024    5:44 AM 12/31/2023    7:44 AM 12/30/2023    5:48 AM  BMP  Glucose 70 - 99 mg/dL 454  098  119   BUN 6 - 20 mg/dL 27  26  29    Creatinine 0.61 - 1.24 mg/dL 1.47  8.29  5.62   Sodium 135 - 145 mmol/L 132  132  134   Potassium 3.5 - 5.1 mmol/L 4.7  4.4  4.2   Chloride 98 - 111 mmol/L 104  100  104   CO2 22 - 32 mmol/L 22  22  21    Calcium 8.9 - 10.3 mg/dL 8.9  9.1  8.9    Still above baseline of 1.1 but appears to be mainly hydration related , cont to push po fluids  3/11- will restart Entresto, but not Jardiance- still not drinking as much as should 19. Wet cough  3/6- Will increase Tessalon to 200 mg TID scheduled  -independently reviewed CXR- Joab't see pneumonia- however will wait for final read  3/11- added Robitussin- and Albuterol nebs- has Duonebs prn- but has to ask for either of them 20. Orthostatic hypotension vs dizziness from double vision  3/8- will try TEDs and abd binder and ACE wraps- if doesn't improve, will add Midodrine.   3/9- patch for 1 eye helping as well as TEDs/ACE wraps- not tried abd binder yet Vitals:   01/01/24 2025 01/02/24 0500  BP: (!) 124/97 116/83  Pulse: 89 79  Resp: 17 17  Temp: 97.8 F (36.6 C) 98 F (36.7 C)  SpO2: 98% 94%   Baseline BPs ok , no orthostatic changes per PT note on 3/9,   3/11- No more dizziness with TEDs.    Will need  nebs machine at home if possible due to pneumonia.    I spent a total of  42  minutes on total care today- >50% coordination of care- due to d/w nursing about Entresto- with pt about PO intake and about Entresto- will recheck BMP Thursday to see if got worse- also team conference to f/u on progress and determine  length of stay   LOS: 8 days A FACE TO FACE EVALUATION WAS PERFORMED  Asna Muldrow 01/02/2024, 9:35 AM

## 2024-01-02 NOTE — Progress Notes (Signed)
 Occupational Therapy Session Note  Patient Details  Name: Dustin Sampson MRN: [id number] Date of Birth: February 14, 1966  Today's Date: 01/02/2024 OT Individual Time: 1415-1530 OT Individual Time Calculation (min): 75 min    Short Term Goals: Week 1:  OT Short Term Goal 1 (Week 1): Pt will complete static standing activity with Min A with LRAD and no LOB OT Short Term Goal 2 (Week 1): Pt will complete LB dressing with Mod A with AE as necessary OT Short Term Goal 3 (Week 1): Pt will complete toileting at Mod A with LRAD  Skilled Therapeutic Interventions/Progress Updates:      Therapy Documentation Precautions:  Precautions Precautions: Fall Shoulder Interventions: Shoulder sling/immobilizer Precaution/Restrictions Comments: ataxia Required Braces or Orthoses: Sling (Lt shoulder) Restrictions Weight Bearing Restrictions Per Provider Order: Yes LUE Weight Bearing Per Provider Order: Non weight bearing Other Position/Activity Restrictions: sling for 2 weeks until ortho follow-up General: "This was a good session!" Pt supine in bed upon OT arrival, agreeable to OT session.  Pain:  intermittent pain reported in ribs when coughing, activity, intermittent rest breaks, distractions provided for pain management, pt reports tolerable to proceed.   ADL: Pt ambulated from therapy gym>room with PFRW at [facility] for stability in order to increase independence with functional mobility, balance and activity tolerance. VC required for increased stride with Lt foot. OT adjusted PF of RW to lower position in order to decrease strain on shoulder. Pt also completed 5x sit to stands with emphasis on technique for increased safety and independence.   Exercises: Pt completed the following exercise circuit in order to improve functional activity and ROM of LUE to prepare for ADLs such as bathing. Pt completed the following exercises in seated position on table top with 1x20 repetitions on each  exercise: -forward/backward towel slides -side to side towel slides -table washes  Other Treatments:  Pt ambulated throughout therapy gym with PFRW at CGA level to complete scavenger hunt to retrieve washcloths. Pt retrieved with VC for decreased weight through LUE, 10/10 items  located at various heights with use of LUE for increased ROM and. Pt completed activity in order to increase activity tolerance, dynamic standing balance and LUE ROM and use.    After session, OT doffed TED hose and ACE wraps once in bed. Pt supine in bed with bed alarm activated, 4 bed rails up per pt request, call light within reach and 4Ps assessed.   Therapy/Group: Individual Therapy  Velia Meyer, OTD, OTR/L 01/02/2024, 4:27 PM

## 2024-01-02 NOTE — Patient Care Conference (Signed)
 Inpatient RehabilitationTeam Conference and Plan of Care Update Date: 01/02/2024   Time: 1135 am    Patient Name: Dustin Sampson      Medical Record Number: [id number]  Date of Birth: 01-07-1966 Sex: Male         Room/Bed: 0Q65H/8I69G-29 Payor Info: Payor: [employer] / Plan: [facility] / Product Type: *No Product type* /    Admit Date/Time:  12/25/2023  5:33 PM  Primary Diagnosis:  Subdural hematoma [facility])  Hospital Problems: Principal Problem:   Subdural hematoma [facility])    Expected Discharge Date: Expected Discharge Date: 01/10/24  Team Members Present: Physician leading conference: Dr. Genice Rouge Social Worker Present: Dossie Der, LCSW Nurse Present: Konrad Dolores, RN PT Present: Truitt Leep, PT OT Present: Velia Meyer, OT SLP Present: Pablo Lawrence, SLP PPS Coordinator present : Fae Pippin, SLP     Current Status/Progress Goal Weekly Team Focus  Bowel/Bladder   Patient continent of bowel and bladder, last BM 3/9.   Patient will remain continent.   Timed toileting to encourage continence.    Swallow/Nutrition/ Hydration   reg/ thin, MBSS= WFL      carryover of compensatory strategies when necessary    ADL's   Toilet transfer: Min A  Toileting: CGA  UB dressing: total A for sling, SBA doffing/donning overhead shirt LB dressing: CGA   Bathing: SBA, able to manipulate soap bottles   SBA overall, Min A UB dressing   energy conserving, ADL retraining    Mobility   fluctuating ataxia, CGA/supervision bed, min transfers and CGA/min A gait ~190' L platform walker   gait and stair training  imbalance 2/2 ataxia    Communication   mild inconsistent dysarthria   sup A   carryover of compensatory strategies.    Safety/Cognition/ Behavioral Observations               Pain   Patient reports pain in ribs, has scheduled and PRN medications.   Decrease pain level.   Manage patient pain.    Skin   Skin is intact.   Skin will remain intact.  Frequent  turning to keep skin intact.      Discharge Planning:  HOme with wife and chidlren who work but buddy wil come over to make sure his needs are met. Wants to get manual wheelchair. Await team's recommnedations   Team Discussion: Patient admitted post fall with TBI. Patient has a left shoulder dislocation,dizziness from double vision and nasal fracture. Patient limited by left rib pain, cough , orthostatic hypotension: medication adjusted by MD.  Patient on target to meet rehab goals: yes, Patient requires  *See Care Plan and progress notes for long and short-term goals.   Revisions to Treatment Plan:  Shoulder immobilizer/sling  Ace wrap TEDS Eye patching  Teaching Needs: ***  Current Barriers to Discharge: Decreased caregiver support and Weight bearing restrictions  Possible Resolutions to Barriers: Family education     Medical Summary Current Status: percocet helping pain- L shoulder sling-nasal fx L shoulder dislocation- poor intake- Cr normally 1.1- up to 1.5  Barriers to Discharge: Behavior/Mood;Complicated Wound;Weight bearing restrictions;Medical stability;Hypotension;Self-care education;Uncontrolled Pain;Renal Insufficiency/Failure  Barriers to Discharge Comments: walked 190 ft with platform waler-needs ramp- pain control issues- WB restrictions- sling for comfort, chronic ataxia Possible Resolutions to [employer] Focus: restarted Entresto- MBSS WNL- off O2; pneumonia- added nebs and robiutussin for severe coughing- 3/19   Continued Need for Acute Rehabilitation Level of Care: The patient requires daily medical management by a physician with specialized  training in physical medicine and rehabilitation for the following reasons: Direction of a multidisciplinary physical rehabilitation program to maximize functional independence : Yes Medical management of patient stability for increased activity during participation in an intensive rehabilitation regime.:  Yes Analysis of laboratory values and/or radiology reports with any subsequent need for medication adjustment and/or medical intervention. : Yes   I attest that I was present, lead the team conference, and concur with the assessment and plan of the team.   Gwenyth Allegra 01/02/2024, 10:38 PM

## 2024-01-02 NOTE — Plan of Care (Signed)
  Problem: RH Expression Communication Goal: LTG Patient will increase speech intelligibility (SLP) Description: LTG: Patient will increase speech intelligibility at word/phrase/conversation level with cues, % of the time (SLP) Outcome: Completed/Met Flowsheets Taken 01/02/2024 1214 LTG: Patient will increase speech intelligibility (SLP): Supervision Level: Conversation level Taken 12/26/2023 1616 Percent of time patient will use intelligible speech: 90   Problem: RH Swallowing Goal: LTG Patient will consume least restrictive diet using compensatory strategies with assistance (SLP) Description: LTG:  Patient will consume least restrictive diet using compensatory strategies with assistance (SLP) Outcome: Completed/Met Flowsheets (Taken 01/02/2024 1214) LTG: Pt Patient will consume least restrictive diet using compensatory strategies with assistance of (SLP): Modified Independent

## 2024-01-02 NOTE — Progress Notes (Signed)
 Speech Language Pathology Discharge Summary  Patient Details  Name: Dustin Sampson MRN: [id number] Date of Birth: [birth year]  Date of Discharge from SLP service:January 02, 2024  Today's Date: 01/02/2024 SLP Individual Time: 1005-1100 SLP Individual Time Calculation (min): 55 min  Skilled Therapeutic Interventions:  Patient was seen in am to address speech intelligibility. Pt was alert and seated upright in WC upon SLP arrival. He recalled completion of MBSS on previous date and results stating, "Everything went well but everyday is not like that." Pt able to recall recommended swallowing strategies which he reported self compensation and utilizing some of those strategies prior to admission. He consumed thin liquid intermittently throughout session via straw sips with no overt s/s asp. Upon SLP arrival, pt was 100% intelligbile at the conversational level. Speech intelligibility fluctuated throughout session to mild deficits and imprecise articulation. Pt able to compensate indep as approp to achieve 95-100% intelligibility. SLP reviewed speeh compensatory strategies, positioning recommendations and swallowing strategies. Pt with good comprehension of recommendations. No further skilled SLP warranted at this time. SLP to sign off.     Patient has met 2 of 2 long term goals.  Patient to discharge at overall Modified Independent;Supervision level.  Reasons goals not met:     Clinical Impression/Discharge Summary: Patient has made excellent gains and has met 2 of 2 LTG's this admission due to improved consumption of thin liquids and speech intelligibility. Pt is currently an overall mod I to sup A for speech intelligibility and mod I for use of swallowing compensatory strategies to minimize overt s/s aspiration with regular solids and thin liquids. Patient education complete. He will remain on the CIR unit to complete PT and OT. No follow services recommended at this time.  Care Partner:  Caregiver Able  to Provide Assistance: Yes    Recommendation:  None     Equipment: none   Reasons for discharge: Treatment goals met   Patient/Family Agrees with Progress Made and Goals Achieved: Yes    [name] 01/02/2024, 12:20 PM

## 2024-01-02 NOTE — Plan of Care (Signed)
  Problem: Consults Goal: RH GENERAL PATIENT EDUCATION Description: See Patient Education module for education specifics. Outcome: Progressing   Problem: RH BOWEL ELIMINATION Goal: RH STG MANAGE BOWEL WITH ASSISTANCE Description: STG Manage Bowel with  supervision Assistance. Outcome: Progressing Goal: RH STG MANAGE BOWEL W/MEDICATION W/ASSISTANCE Description: STG Manage Bowel with Medication with  supervision Assistance. Outcome: Progressing   Problem: RH BLADDER ELIMINATION Goal: RH STG MANAGE BLADDER WITH ASSISTANCE Description: STG Manage Bladder With supervision  Assistance Outcome: Progressing   Problem: RH SKIN INTEGRITY Goal: RH STG SKIN FREE OF INFECTION/BREAKDOWN Description:  Manage skin free of infection/ breakdown  integrity with supervision   Outcome: Progressing   Problem: RH SAFETY Goal: RH STG ADHERE TO SAFETY PRECAUTIONS W/ASSISTANCE/DEVICE Description: STG Adhere to Safety Precautions With supervision Assistance/Device. Outcome: Progressing   Problem: RH PAIN MANAGEMENT Goal: RH STG PAIN MANAGED AT OR BELOW PT'S PAIN GOAL Description: <4 w/ prns Outcome: Progressing   Problem: RH KNOWLEDGE DEFICIT GENERAL Goal: RH STG INCREASE KNOWLEDGE OF SELF CARE AFTER HOSPITALIZATION Description: Manage increase knowledge of self care after hospitalization with supervision using educational materials prrovided Outcome: Progressing

## 2024-01-02 NOTE — Progress Notes (Signed)
 Physical Therapy Session Note  Patient Details  Name: Dustin Sampson MRN: [id number] Date of Birth: 1965/12/11  Today's Date: 01/02/2024 PT Individual Time:0900-1000 (60 min) + 1100-1127 (27 min)      Short Term Goals: Week 1:  PT Short Term Goal 1 (Week 1): Pt will transfer sit to stand w/ CGA consistently safely. PT Short Term Goal 2 (Week 1): Pt will transfer w/c <> bed w/ CGA. PT Short Term Goal 3 (Week 1): Pt will amb w/ LRAD and CGA x 75'  Skilled Therapeutic Interventions/Progress Updates:      Therapy Documentation Precautions:  Precautions Precautions: Fall Shoulder Interventions: Shoulder sling/immobilizer Precaution/Restrictions Comments: ataxia Required Braces or Orthoses: Sling (Lt shoulder) Restrictions Weight Bearing Restrictions Per Provider Order: Yes LUE Weight Bearing Per Provider Order: Non weight bearing Other Position/Activity Restrictions: sling for 2 weeks until ortho follow-up  Treatment Session 1:   Pt received semi-reclined in bed, agreeable to PT session and declines pain. PT dependently donned ted hose, abdominal binder, ace wraps for BP management. Min A with ~3' ambulatory transfer with hemiwalker to w/c, dependent transport to gym. Pt requested anxiety medicine prior to gait training due to increased SOB and ambulated 62' min A with 2# weighted hemiwalker. Plan to address turns with gait as pt with tendency to pivot instead of step turn. Pt returned to bedside with all needs in reach, chair alarm on. PA verbally cleared pt to utilize platform RW and sling for comfort, plan to initiate in future session.    Treatment Session 2:   Pt received seated bedside, agreeable to PT session with emphasis on gait training with left platform RW. Initially pt ambulated 21' with min A and progressed to CGA/Supervision with weighted platform RW ~190'  (2# weights placed on each side). Pt left semi-reclined in bed all needs in reach, alarm on. Pt continues to require  cues for step length and pacing.     Therapy/Group: Individual Therapy  Truitt Leep Truitt Leep PT, DPT  01/02/2024, 7:10 PM

## 2024-01-02 NOTE — Progress Notes (Signed)
 Patient ID: Dustin Sampson, male   DOB: Dec 25, 1965, 58 y.o.   MRN: [id number]  Met with pt to discuss team conference with goals of supervision level and target discharge date of 3/19. Have also text his wife to see if can come in Sat or Sunday to see him in therapies, since she works M-F as a Runner, broadcasting/film/video. Will await return call or text

## 2024-01-03 DIAGNOSIS — S065XAA Traumatic subdural hemorrhage with loss of consciousness status unknown, initial encounter: Secondary | ICD-10-CM | POA: Diagnosis not present

## 2024-01-03 LAB — GLUCOSE, CAPILLARY
Glucose-Capillary: 125 mg/dL — ABNORMAL HIGH (ref 70–99)
Glucose-Capillary: 159 mg/dL — ABNORMAL HIGH (ref 70–99)
Glucose-Capillary: 93 mg/dL (ref 70–99)
Glucose-Capillary: 115 mg/dL — ABNORMAL HIGH (ref 70–99)

## 2024-01-03 MED ORDER — GUAIFENESIN-CODEINE 100-10 MG/5ML PO SOLN
10.0000 mL | ORAL | Status: DC | PRN
  Administered 2024-01-10: 10 mL via ORAL
  Filled 2024-01-03: qty 10

## 2024-01-03 MED ORDER — SORBITOL 70 % SOLN: 30.0000 mL | Freq: Once | Status: DC

## 2024-01-03 NOTE — Plan of Care (Signed)
  Problem: Consults Goal: RH GENERAL PATIENT EDUCATION Description: See Patient Education module for education specifics. Outcome: Progressing   Problem: RH BOWEL ELIMINATION Goal: RH STG MANAGE BOWEL WITH ASSISTANCE Description: STG Manage Bowel with  supervision Assistance. Outcome: Progressing Goal: RH STG MANAGE BOWEL W/MEDICATION W/ASSISTANCE Description: STG Manage Bowel with Medication with  supervision Assistance. Outcome: Progressing   Problem: RH BLADDER ELIMINATION Goal: RH STG MANAGE BLADDER WITH ASSISTANCE Description: STG Manage Bladder With supervision  Assistance Outcome: Progressing   Problem: RH SKIN INTEGRITY Goal: RH STG SKIN FREE OF INFECTION/BREAKDOWN Description:  Manage skin free of infection/ breakdown  integrity with supervision   Outcome: Progressing   Problem: RH SAFETY Goal: RH STG ADHERE TO SAFETY PRECAUTIONS W/ASSISTANCE/DEVICE Description: STG Adhere to Safety Precautions With supervision Assistance/Device. Outcome: Progressing   Problem: RH PAIN MANAGEMENT Goal: RH STG PAIN MANAGED AT OR BELOW PT'S PAIN GOAL Description: <4 w/ prns Outcome: Progressing   Problem: RH KNOWLEDGE DEFICIT GENERAL Goal: RH STG INCREASE KNOWLEDGE OF SELF CARE AFTER HOSPITALIZATION Description: Manage increase knowledge of self care after hospitalization with supervision using educational materials prrovided Outcome: Progressing

## 2024-01-03 NOTE — Progress Notes (Signed)
 Physical Therapy Weekly Progress Note  Patient Details  Name: Dustin Sampson MRN: [id number] Date of Birth: [birth year]  Beginning of progress report period: December 26, 2023 End of progress report period: January 03, 2024  Today's Date: 01/03/2024 PT Individual Time: 4098-1191, 1415-1530 PT Individual Time Calculation (min): 44 min , 75 min  Patient has met 3 of 3 short term goals.  Pt progressing well with management of global ataxia and endurance deficits. Pt does well with music and external cues and demonstrates improved reciprocal stepping pattern, especially with PFRW. Pt has better understanding of safety precautions d/t his ataxia and UE WB restrictions but still benefits from frequent rest breaks and verbal cues of his precautions.  Patient continues to demonstrate the following deficits muscle weakness, decreased cardiorespiratoy endurance, ataxia, and decreased postural control, decreased balance strategies, and difficulty maintaining precautions and therefore will continue to benefit from skilled PT intervention to increase functional independence with mobility.  Patient progressing toward long term goals..  Continue plan of care.  PT Short Term Goals Week 1:  PT Short Term Goal 1 (Week 1): Pt will transfer sit to stand w/ CGA consistently safely. PT Short Term Goal 1 - Progress (Week 1): Met PT Short Term Goal 2 (Week 1): Pt will transfer w/c <> bed w/ CGA. PT Short Term Goal 2 - Progress (Week 1): Met PT Short Term Goal 3 (Week 1): Pt will amb w/ LRAD and CGA x 75' PT Short Term Goal 3 - Progress (Week 1): Met Week 2:  PT Short Term Goal 1 (Week 2): STG=LTG d/t ELOS  Skilled Therapeutic Interventions/Progress Updates:     Session 1: Pt received supine in bed and agreeable to therapy. Pt notes unrated pain in L shoulder and ribs but states he recently received pain medication and no further intervention required.   SPT dependently donned thigh-high TED hose, ace wraps, abdominal  binder, and LUE sling. Pt transitioned from supine<>sit with modA and use of bed rails, had difficulty with anterior weight shift needed to move to EOB. Pt performed x10 posterior to anterior leans in sitting EOB with CGA to improve dynamic sitting balance and core stability, pt had increased difficulty leaning posteriorly and to the L, noted some rib soreness with this so exercise d/c.   Pt performed blocked practice of sit<>stands to improve LE strength with verbal cues for eccentric control and anterior weight shift. 2x8 sit<>stands with PFRW and CGA.   Abdominal binder and sling dependently doffed and pt transitioned sit<>supine with minA. Pt required verbal cues throughout session to avoid use of LUE. Pt left supine in bed with all needs in reach and alarm on.    Session 2: Pt received seated in w/c and agreeable to therapy session focusing on gait training. Pt notes unrated pain but that he has recently received pain medication. Pt dependently transported to to hallway for gait training.  Gait Training: L PFRW, music throughout - 78' with CGA, increased trunk flexion and step cadence, alternating wide and narrow BOS, variable step length and gait velocity, verbal cues for upright posture and step width - 350' with SBA and therapist walking alongside for pacing (CGA for turns), improved upright posture and more consistent step cadence and gait velocity, minor self-corrected LOB when turning, passing others, or going over changes in flooring  Stair Training: RUE on handrail, LUE in sling - x4 with CGA, pt leaned forward for railing at top of stairs, led with L leg descending and noted feeling of "  shakiness", increased LE ataxia noted -x4 with CGA, descending facing railing, pt had improved balance and was able to use RUE support more effectively  Pt dependently transported back to room, SBA ambulatory transfer back to bed. Sling, abdominal binder, ace-wrap, and thigh-high TED hose doffed  dependently. Pt left supine with all needs in reach and alarm on.   Therapy Documentation Precautions:  Precautions Precautions: Fall Shoulder Interventions: Shoulder sling/immobilizer Precaution/Restrictions Comments: ataxia Required Braces or Orthoses: Sling (Lt shoulder) Restrictions Weight Bearing Restrictions Per Provider Order: Yes LUE Weight Bearing Per Provider Order: Non weight bearing Other Position/Activity Restrictions: sling for 2 weeks until ortho follow-up General:      Therapy/Group: Individual Therapy  Collins Scotland 01/03/2024, 12:20 PM

## 2024-01-03 NOTE — Progress Notes (Signed)
 Patient ID: Dustin Sampson, male   DOB: Feb 25, 1966, 58 y.o.   MRN: [id number]  Have updated wife and scheduled family training for Sunday 1:00-3:00 will let team know.

## 2024-01-03 NOTE — Progress Notes (Signed)
 Occupational Therapy Weekly Progress Note  Patient Details  Name: Edan Juday MRN: [id number] Date of Birth: 09-17-1966  Beginning of progress report period: December 26, 2023 End of progress report period: January 03, 2024  Today's Date: 01/03/2024 OT Individual [id number] & [id number] OT Individual Time Calculation (min): 58 min & 27 min    Patient has met 3 of 3 short term goals.  Pt with increased independence with ADLs such as bathing, dressing and transfers. Pt upgraded to wearing sling for comfort only on RUE and now presenting with increased independence with ADLs. Pt family training scheduled for this Sunday 3/16 with wife.   Patient continues to demonstrate the following deficits: muscle weakness and muscle joint tightness, decreased cardiorespiratoy endurance, and decreased standing balance and decreased balance strategies and therefore will continue to benefit from skilled OT intervention to enhance overall performance with BADL and Reduce care partner burden.  Patient progressing toward long term goals..  Continue plan of care.  OT Short Term Goals Week 1:  OT Short Term Goal 1 (Week 1): Pt will complete static standing activity with Min A with LRAD and no LOB OT Short Term Goal 1 - Progress (Week 1): Met OT Short Term Goal 2 (Week 1): Pt will complete LB dressing with Mod A with AE as necessary OT Short Term Goal 2 - Progress (Week 1): Met OT Short Term Goal 3 (Week 1): Pt will complete toileting at Mod A with LRAD OT Short Term Goal 3 - Progress (Week 1): Met Week 2:  OT Short Term Goal 1 (Week 2): STG=LTG d/t ELOS  Skilled Therapeutic Interventions/Progress Updates:      Therapy Documentation Precautions:  Precautions Precautions: Fall Shoulder Interventions: Shoulder sling/immobilizer Precaution/Restrictions Comments: ataxia Required Braces or Orthoses: Sling (Lt shoulder) Restrictions Weight Bearing Restrictions Per Provider Order: Yes LUE Weight Bearing Per  Provider Order: Non weight bearing Other Position/Activity Restrictions: sling for 2 weeks until ortho follow-up Session 1 General: "My wife will be up tonight." Pt supine in bed upon OT arrival, agreeable to OT session. Pt SBA supine>EOB with reported dizziness (had abd binder, ACE and TEDs donned) BP 107/75 after rest break on EOB before transferring. Pt transferred OOB at [facility] using stand pivot transfer without AD.  Pain:  intermittent pain reported in shoulder, activity, intermittent rest breaks, distractions provided for pain management, pt reports tolerable to proceed.   Exercises: Pt completed the following exercise circuit in order to improve functional activity, ROM and endurance in LUE to prepare for ADLs such as bathing. Pt completed the following exercises in seated position 1x20 repetitions on each exercise: -scapular elevation/depression -scapular protraction/retraction -protraction/retraction with AAROM for reaching forward for offloading weight of hand -hand paddling at waist level   Pt seated in W/C at end of session with W/C alarm donned, call light within reach and 4Ps assessed.    Session 2 General: "I feel better than this morning" Pt seated in W/C upon OT arrival, agreeable to OT.  Pain: no pain reported  Balance: Pt completed functional transfers/mobility with emphasis on quality movements and safety in order to promote increased balance strategies with ADL participation. Pt completed functional mobility with PFW from room><day room with one seated rest break. Pt with slight Lt foot drop with mobility with random bouts of loss of footing d/t Lt foot drag. Pt provided VC for increased foot up on LLE. Pt overall CGA, although Min A to correct loss of footing. Pt may benefit from AFO for LLE in order  to increased independence with functional mobility.   Pt seated in W/C at end of session with W/C alarm donned, call light within reach and 4Ps assessed. Pt set up with  lunch. No reports of dizziness during session.   Therapy/Group: Individual Therapy  Velia Meyer, OTD, OTR/L 01/03/2024, 1:54 PM

## 2024-01-03 NOTE — Progress Notes (Signed)
 PROGRESS NOTE   Subjective/Complaints:  Pt reports hasn't asked for Nebs in last day, but was coughing a LOT yesterday and overnight-  Comes and goes, but when it occurs, it's "really bad".  LBM 2 days ago- per chart is 3 days ago.  Coughed more after nebs yesterday- initially, but got "stuff up".    ROS:    Pt denies SOB, abd pain, CP, N/V/C/D, and vision changes  (+) Cough Left sided rib pain   Objective:   DG Swallowing Func-Speech Pathology Result Date: 01/01/2024 Table formatting from the original result was not included. Modified Barium Swallow Study Patient Details Name: Dustin Sampson MRN: [id number] Date of Birth: [birth year] Today's Date: 01/01/2024 HPI/PMH: HPI: Pt is a 58 y/o male with PMH of ataxia, anxiety, insomnia, CVA, hypothyroidism, HTN, PAD, HFrEF, and frequent falls who presented to [facility] on 12/19/23 following a fall at home.  He is on plavix and with obvious facial injury.  Imaging revealed open nasal bone fracture, L anterior shoulder dislocation, and SDH.  Neurosurgery consulted and recommended repeat imaging.  ED labs notable for WBC 12.6, Na 134, and glucose 192.  Shoulder reduced in the ED and pt to be NWB with sling.  Follow up CT was stable and neurosurgery recommended holding plavix. Clinical Impression: Clinical Impression: Patient presents with oropharyngeal swallowing function that is grossly WFL. No penetration/aspiration observed across all trials. Base of tongue retraction, epiglottic inversion, hyolaryngeal excursion/elevation, pharyngeal stripping wave, and UES distention all sufficient for full pharyngeal clearance and adequate airway protection. Recommend continuation of regular/thin liquid diet with medications administered whole with thin liquid. Patient endorses variable ataxia day to day and notes that on days were ataxia is more severe, he will cough when drinking thin liquids sequentially.  Recommend that on days were ataxia is more significant, patient take small, slow sips and avoid straws, however given patient's intact cognition, patient is capable of judging when these modifications are indicated. SLP will continue to monitor swallowing function peripherally during motor speech therapy sessions. Factors that may increase risk of adverse event in presence of aspiration [name] 2021): No data recorded Recommendations/Plan: Swallowing Evaluation Recommendations Swallowing Evaluation Recommendations Recommendations: PO diet PO Diet Recommendation: Regular; Thin liquids (Level 0) Liquid Administration via: Cup; Straw Medication Administration: Whole meds with liquid Supervision: Patient able to self-feed Oral care recommendations: Oral care BID (2x/day) Treatment Plan Treatment Plan Treatment recommendations: Therapy as outlined in treatment plan below Follow-up recommendations: No SLP follow up Functional status assessment: Patient has had a recent decline in their functional status and demonstrates the ability to make significant improvements in function in a reasonable and predictable amount of time. Treatment frequency: Min 1x/week Treatment duration: 1 week Interventions: Compensatory techniques; Patient/family education Recommendations Recommendations for follow up therapy are one component of a multi-disciplinary discharge planning process, led by the attending physician.  Recommendations may be updated based on patient status, additional functional criteria and insurance authorization. Assessment: Orofacial Exam: Orofacial Exam Oral Cavity: Oral Hygiene: WFL Oral Cavity - Dentition: Adequate natural dentition Orofacial Anatomy: WFL Oral Motor/Sensory Function: Other (comment) (ataxia) Anatomy: Anatomy: WFL Boluses Administered: Boluses Administered Boluses Administered: Thin liquids (Level 0); Mildly  thick liquids (Level 2, nectar thick); Moderately thick liquids (Level 3, honey  thick); Puree; Solid  Oral Impairment Domain: Oral Impairment Domain Lip Closure: No labial escape Tongue control during bolus hold: Cohesive bolus between tongue to palatal seal Bolus preparation/mastication: Timely and efficient chewing and mashing Bolus transport/lingual motion: Brisk tongue motion Oral residue: Trace residue lining oral structures Location of oral residue : Tongue; Palate Initiation of pharyngeal swallow : Pyriform sinuses  Pharyngeal Impairment Domain: Pharyngeal Impairment Domain Soft palate elevation: No bolus between soft palate (SP)/pharyngeal wall (PW) Laryngeal elevation: Complete superior movement of thyroid cartilage with complete approximation of arytenoids to epiglottic petiole Anterior hyoid excursion: Complete anterior movement Epiglottic movement: Complete inversion Laryngeal vestibule closure: Complete, no air/contrast in laryngeal vestibule Pharyngeal stripping wave : Present - complete Pharyngeal contraction (A/P view only): N/A Pharyngoesophageal segment opening: Complete distension and complete duration, no obstruction of flow Tongue base retraction: No contrast between tongue base and posterior pharyngeal wall (PPW) Pharyngeal residue: Complete pharyngeal clearance  Esophageal Impairment Domain: No data recorded Pill: No data recorded Penetration/Aspiration Scale Score: Penetration/Aspiration Scale Score 1.  Material does not enter airway: Thin liquids (Level 0); Mildly thick liquids (Level 2, nectar thick); Moderately thick liquids (Level 3, honey thick); Puree; Solid Compensatory Strategies: Compensatory Strategies Compensatory strategies: No   General Information: Caregiver present: No  Diet Prior to this Study: Regular; Thin liquids (Level 0)   Temperature : Normal   Respiratory Status: WFL   Supplemental O2: None (Room air)   History of Recent Intubation: No  Behavior/Cognition: Alert; Cooperative; Pleasant mood Self-Feeding Abilities: Able to self-feed Baseline vocal  quality/speech: Normal Volitional Cough: Able to elicit Volitional Swallow: Able to elicit Exam Limitations: No limitations Goal Planning: Prognosis for improved oropharyngeal function: Good No data recorded No data recorded Patient/Family Stated Goal: none stated Consulted and agree with results and recommendations: Patient Pain: Pain Assessment Pain Assessment: 0-10 Pain Score: 3 Breathing: 0 Negative Vocalization: 0 Facial Expression: 0 Body Language: 0 Consolability: 0 PAINAD Score: 0 End of Session: Start Time:No data recorded Stop Time: No data recorded Time Calculation:No data recorded Charges: No data recorded SLP visit diagnosis: SLP Visit Diagnosis: Dysphagia, unspecified (R13.10) Past Medical History: Past Medical History: Diagnosis Date  Anxiety   Arthritis   Diabetes mellitus without complication (HCC)   Gout   Hyperlipemia   Hypertension   Hypothyroidism   Insomnia   Pneumonia   Stroke [facility])   Thyroid disease  Past Surgical History: Past Surgical History: Procedure Laterality Date  ABDOMINAL AORTOGRAM W/LOWER EXTREMITY Bilateral 08/21/2020  Procedure: ABDOMINAL AORTOGRAM W/LOWER EXTREMITY;  Surgeon: Sherren Kerns, MD;  Location: [facility] IN[facility]ASIVE [facility];  Service: Cardiovascular;  Laterality: Bilateral;  ABDOMINAL AORTOGRAM W/LOWER EXTREMITY N/A 09/04/2020  Procedure: ABDOMINAL AORTOGRAM W/LOWER EXTREMITY;  Surgeon: Sherren Kerns, MD;  Location: [facility] IN[facility] CV [facility];  Service: Cardiovascular;  Laterality: N/A;  COLONOSCOPY    x2  FEMORAL ARTERY EXPLORATION Left 09/04/2020  Procedure: FEMORAL ARTERY EXPLORATION;  Surgeon: Chuck Hint, MD;  Location: [facility] OR;  Service: Vascular;  Laterality: Left;  NASAL SINUS SURGERY    PERIPHERAL VASCULAR INTERVENTION Left 08/21/2020  Procedure: PERIPHERAL VASCULAR INTERVENTION;  Surgeon: Sherren Kerns, MD;  Location: [facility] CV [facility];  Service: Cardiovascular;  Laterality: Left;  sfa stent x 3  PERIPHERAL VASCULAR INTERVENTION  09/04/2020  Procedure:  PERIPHERAL VASCULAR INTERVENTION;  Surgeon: Sherren Kerns, MD;  Location: [facility];  Service: Cardiovascular;;  Right SFA  RIGHT/LEFT HEART CATH  AND CORONARY ANGIOGRAPHY N/A 11/16/2023  Procedure: RIGHT/LEFT HEART CATH AND CORONARY ANGIOGRAPHY;  Surgeon: Yates Decamp, MD;  Location: [facility];  Service: Cardiovascular;  Laterality: N/A;  SHOULDER ARTHROSCOPY WITH ROTATOR CUFF REPAIR AND SUBACROMIAL DECOMPRESSION Left 02/04/2021  Procedure: SHOULDER ARTHROSCOPY WITH ROTATOR CUFF REPAIR AND SUBACROMIAL DECOMPRESSION;  Surgeon: Jones Broom, MD;  Location: [facility] ORS;  Service: Orthopedics;  Laterality: Left;  WISDOM TOOTH EXTRACTION    WRIST SURGERY    bilat Jeannie Done, M.A., CCC-SLP Yetta Barre 01/01/2024, 9:33 AM    Recent Labs    01/01/24 0544  WBC 7.9  HGB 11.9*  HCT 35.9*  PLT 317    Recent Labs    01/01/24 0544  NA 132*  K 4.7  CL 104  CO2 22  GLUCOSE 113*  BUN 27*  CREATININE 1.42*  CALCIUM 8.9    Intake/Output Summary (Last 24 hours) at 01/03/2024 0818 Last data filed at 01/02/2024 2230 Gross per 24 hour  Intake 457 ml  Output 1700 ml  Net -1243 ml        Physical Exam: Vital Signs Blood pressure 113/84, pulse 84, temperature 97.8 F (36.6 C), temperature source Oral, resp. rate 16, height 5\' 9"  (1.753 m), weight 89 kg, SpO2 93%.    General: awake, alert, appropriate, sitting up in bed; NAD HENT: conjugate gaze; oropharynx moist, nasal abrasions- healing CV: regular rate and rhythm; no JVD Pulmonary: tight, some wheezing heard GI: soft, NT, ND, (+)BS Psychiatric: appropriate Neurological: Ox3 MSK- L shoulder in sling- less TTP; ribs extremely TTP Skin: No evidence of breakdown, no evidence of rash MSK- bilateral shoulders symmetric , no subluxation no pain to palpation  Less dysarthric this AM-  Neurologic: Cranial nerves II through XII intact, motor strength is 5/5 in RIght  deltoid, bicep, tricep, grip,5/5 right and 4/5 left  hip  flexor, knee extensors, ankle dorsiflexor and plantar flexor Left grip 4/5   Assessment/Plan: 1. Functional deficits which require 3+ hours per day of interdisciplinary therapy in a comprehensive inpatient rehab setting. Physiatrist is providing close team supervision and 24 hour management of active medical problems listed below. Physiatrist and rehab team continue to assess barriers to discharge/monitor patient progress toward functional and medical goals  Care Tool:  Bathing              Bathing assist Assist Level: Maximal Assistance - Patient 24 - 49%     Upper Body Dressing/Undressing Upper body dressing   What is the patient wearing?: Pull over shirt    Upper body assist Assist Level: Maximal Assistance - Patient 25 - 49%    Lower Body Dressing/Undressing Lower body dressing            Lower body assist Assist for lower body dressing: Maximal Assistance - Patient 25 - 49%     Toileting Toileting    Toileting assist Assist for toileting: Maximal Assistance - Patient 25 - 49%     Transfers Chair/bed transfer  Transfers assist     Chair/bed transfer assist level: Moderate Assistance - Patient 50 - 74%     Locomotion Ambulation   Ambulation assist      Assist level: Minimal Assistance - Patient > 75% Assistive device: Walker-hemi Max distance: 25   Walk 10 feet activity   Assist     Assist level: Minimal Assistance - Patient > 75% Assistive device: Walker-hemi   Walk 50 feet activity   Assist Walk 50 feet with 2 turns activity did not  occur: Safety/medical concerns         Walk 150 feet activity   Assist Walk 150 feet activity did not occur: Safety/medical concerns         Walk 10 feet on uneven surface  activity   Assist     Assist level: Moderate Assistance - Patient - 50 - 74% Assistive device: Education administrator Is the patient using a wheelchair?: Yes Type of Wheelchair: Manual     Wheelchair assist level: Supervision/Verbal cueing Max wheelchair distance: 200+    Wheelchair 50 feet with 2 turns activity    Assist        Assist Level: Supervision/Verbal cueing   Wheelchair 150 feet activity     Assist      Assist Level: Supervision/Verbal cueing   Blood pressure 113/84, pulse 84, temperature 97.8 F (36.6 C), temperature source Oral, resp. rate 16, height 5\' 9"  (1.753 m), weight 89 kg, SpO2 93%.  Medical Problem List and Plan: 1. Functional deficits secondary to TBI             -patient may  shower             -ELOS/Goals: 14-18d Sup  D/c 3/19  Con't CIR PT, OT and SLP for swallowing 2.  Antithrombotics: -DVT/anticoagulation:  Mechanical: Sequential compression devices, below knee Bilateral lower extremities 3/11- will start Eliquis- 2.5 mg BID since doesn't want lovenox injections             -antiplatelet therapy: N/A--Plavix to be held for a week per Dr. Jordan Likes --Resume on 03/04  3. Pain Management:  On IV hydromorphone and Oxycodone prn.              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 Mood/Behavior/Sleep: LCSW to follow for evaluation and support.              -antipsychotic agents: N/A   5. Neuropsych/cognition: This patient is capable of making decisions on his own behalf. 6. Skin/Wound Care: Routine pressure measures 7. Fluids/Electrolytes/Nutrition: Monitor I/O. Check CMET in am 8.  Left shoulder dislocation: NWB with sling X 2 weeks. Follow up with ortho in 2 weeks.  Dr Ave Filter did L RTC repair several years  ago 9.  Nasal Fracture: Continue Keflex thru 03/05-->follow up with ENT in a week 10.  Pneumonia- Left chest wall pain/SOB: Wean oxygen. Encourage pulmonary hygiene  3/4- wasn't wearing this AM  3/8- off O2 11. Chronic HFrEF: Compensated. Daily weight and strict I/O             --ON Jardiance, Metoprolol, Pravastatin, Spironolactone, Entresto  3/4- ordered daily weights-will monitor for trend  3/7- weight down ~ 0.8 kg- off jardiance now will monitor  3/8- weight stable- con't to monitor  3/9- weight up 1 kg- will monitor trend  3/11- Weight up 6.5 lbs in 1 day- will Restart Entresto, but not sure if weight his real weight- will recheck in AM  3/12- weight back down to  89 kg- con't regimen 12. Anxiety d/o: Continue Klonopin prn 13. Insomnia: On Restoril 30 mg/hs 14. Wheezing: Hx smoking plus not taking deep breaths due to left rib painFlutter valve qid for pulmonary hygiene. Will schedule  duo nebs qid for now. 3/12- Con't nebs prn- since was switched from scheduled to prn by respiratory- asked pt to ask for and this AM, asked nursing to give.    15.  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 Thrush  3/4- will give Diflucan 200 mg x1 and then 100 mg daily x 6 days- might need to increase dose if doesn't improve.   3/5- will add Magic mouthwash with lidocaine- QID- for 7 days  3/7- Increased Diflucan to 200mg  yesterday- 15% better now- con't regimen  No oral lesions, overall better  17. Constipation  3/4- will give Sorbitol 30 mg x1 to get him to have BM after therapy- has been 3 days.   3/5- had good BM- con't regimen  3/7- will give Sorbitol since it's been 3 days on Senna 2 tabs BID and Miralax- con't regimen otherwise  3/8- LBM 2 days ago- if no results by tomorrow, will give Sorbitol  3/9- BM x 2  3/12- LBM 3 days ago- per chart- pt wants sorbitol- will give after therapy today 18. AKI  3/4- will  recheck labs Thursday since with HF, Dayne't want him to get too wet.   3/5- asked pt to drink a little more than currently drinking  3/6- Pt's BUN up to 33 and Cr up to 1.50- think it's because hurts to drink/eat, so will give some IVFs to tune back up- usually Cr <1 and is now 1.5- will give 75cc/hour starting after therapy and do for 15 hours- won't do more, since has HF.       Latest Ref Rng & Units 01/01/2024    5:44 AM 12/31/2023    7:44 AM 12/30/2023    5:48 AM  BMP  Glucose 70 - 99 mg/dL 027  253  664   BUN 6 - 20 mg/dL 27  26  29    Creatinine 0.61 - 1.24 mg/dL 4.03  4.74  2.59   Sodium 135 - 145 mmol/L 132  132  134   Potassium 3.5 - 5.1 mmol/L 4.7  4.4  4.2   Chloride 98 - 111 mmol/L 104  100  104   CO2 22 - 32 mmol/L 22  22  21    Calcium 8.9 - 10.3 mg/dL 8.9  9.1  8.9    Still above baseline of 1.1 but appears to be mainly hydration related , cont to push po fluids  3/11- will restart Entresto, but not Jardiance- still not drinking as much as should 19. Wet cough  3/6- Will increase Tessalon to 200 mg TID scheduled  -independently reviewed CXR- Ezekeil't see pneumonia- however will wait for final read  3/11- added Robitussin- and Albuterol nebs- has Duonebs prn- but has to ask for either of them 20. Orthostatic hypotension vs dizziness from double vision  3/8- will try TEDs and abd binder and ACE wraps- if doesn't improve, will add Midodrine.   3/9- patch for 1 eye helping as well as TEDs/ACE wraps- not tried abd binder yet Vitals:   01/03/24 0532 01/03/24 0737  BP: 92/75 113/84  Pulse: 82 84  Resp: 16 16  Temp: 97.8 F (36.6 C)   SpO2: 93%  Baseline BPs ok , no orthostatic changes per PT note on 3/9,   3/11- No more dizziness with TEDs.    Will need nebs machine at home if possible due to pneumonia.     I spent a total of  39  minutes on total care today- >50% coordination of care- due to  d/w pt prolonged about how nebs work and why he needs them- will Add Cough meds  with codeine to help cough. Also d/w nursing about nebs  LOS: 9 days A FACE TO FACE EVALUATION WAS PERFORMED  Kion Huntsberry 01/03/2024, 8:18 AM

## 2024-01-04 DIAGNOSIS — S065XAA Traumatic subdural hemorrhage with loss of consciousness status unknown, initial encounter: Secondary | ICD-10-CM | POA: Diagnosis not present

## 2024-01-04 LAB — BASIC METABOLIC PANEL
Anion gap: 5 (ref 5–15)
BUN: 29 mg/dL — ABNORMAL HIGH (ref 6–20)
CO2: 23 mmol/L (ref 22–32)
Calcium: 9.1 mg/dL (ref 8.9–10.3)
Chloride: 104 mmol/L (ref 98–111)
Creatinine, Ser: 1.45 mg/dL — ABNORMAL HIGH (ref 0.61–1.24)
GFR, Estimated: 56 mL/min — ABNORMAL LOW (ref >60)
Glucose, Bld: 121 mg/dL — ABNORMAL HIGH (ref 70–99)
Potassium: 5.1 mmol/L (ref 3.5–5.1)
Sodium: 132 mmol/L — ABNORMAL LOW (ref 135–145)

## 2024-01-04 LAB — CBC
HCT: 35.8 % — ABNORMAL LOW (ref 39.0–52.0)
Hemoglobin: 11.9 g/dL — ABNORMAL LOW (ref 13.0–17.0)
MCH: 28.3 pg (ref 26.0–34.0)
MCHC: 33.2 g/dL (ref 30.0–36.0)
MCV: 85 fL (ref 80.0–100.0)
Platelets: 297 10*3/uL (ref 150–400)
RBC: 4.21 MIL/uL — ABNORMAL LOW (ref 4.22–5.81)
RDW: 13.6 % (ref 11.5–15.5)
WBC: 7.8 10*3/uL (ref 4.0–10.5)
nRBC: 0 % (ref 0.0–0.2)

## 2024-01-04 LAB — GLUCOSE, CAPILLARY
Glucose-Capillary: 129 mg/dL — ABNORMAL HIGH (ref 70–99)
Glucose-Capillary: 137 mg/dL — ABNORMAL HIGH (ref 70–99)
Glucose-Capillary: 92 mg/dL (ref 70–99)
Glucose-Capillary: 123 mg/dL — ABNORMAL HIGH (ref 70–99)

## 2024-01-04 MED ORDER — IPRATROPIUM-ALBUTEROL 20-100 MCG/ACT IN AERS
1.0000 | INHALATION_SPRAY | Freq: Four times a day (QID) | RESPIRATORY_TRACT | Status: DC | PRN
  Administered 2024-01-04 – 2024-01-05 (×2): 1 via RESPIRATORY_TRACT
  Filled 2024-01-04: qty 4

## 2024-01-04 MED ORDER — OXYCODONE HCL 5 MG PO TABS
5.0000 mg | ORAL_TABLET | ORAL | Status: DC | PRN
  Administered 2024-01-04 (×2): 10 mg via ORAL
  Administered 2024-01-05: 5 mg via ORAL
  Administered 2024-01-05 – 2024-01-10 (×19): 10 mg via ORAL
  Filled 2024-01-04 (×12): qty 2
  Filled 2024-01-04: qty 1
  Filled 2024-01-04 (×5): qty 2
  Filled 2024-01-04: qty 1
  Filled 2024-01-04 (×4): qty 2

## 2024-01-04 NOTE — Progress Notes (Addendum)
 Patient declined one time dose of clonazepam, not ordered.

## 2024-01-04 NOTE — Progress Notes (Signed)
 Occupational Therapy Session Note  Patient Details  Name: Dustin Sampson MRN: [id number] Date of Birth: 02-Feb-1966  Today's Date: 01/04/2024 OT Individual Time: 4098-1191 & 1445-1530 OT Individual Time Calculation (min): 45 min & 45 min   Short Term Goals: Week 2:  OT Short Term Goal 1 (Week 2): STG=LTG d/t ELOS  Skilled Therapeutic Interventions/Progress Updates:      Therapy Documentation Precautions:  Precautions Precautions: Fall Shoulder Interventions: Shoulder sling/immobilizer Precaution/Restrictions Comments: ataxia Required Braces or Orthoses: Sling (Lt shoulder) Restrictions Weight Bearing Restrictions Per Provider Order: Yes LUE Weight Bearing Per Provider Order: Non weight bearing Other Position/Activity Restrictions: sling for 2 weeks until ortho follow-up Session 1 General: "I wasn't hungry" Pt supine in bed upon OT arrival, agreeable to OT session.  Pain:  8/10 pain reported in Lt shoulder, activity, intermittent rest breaks, distractions provided for pain management, pt reports tolerable to proceed. Pt noted MD stated to use hemi cane instead of PFRW to decrease use of shoulder for pain relief.  ADL: Pt completing ADL tasks in order to increase independence with tasks as well as increase activity tolerance. Pt completing the following tasks and current levels of assist: Bed mobility: Eating: set-up, pt eating breakfast at end of session Grooming/oral hygiene: set-up, seated in W/C at sink for washing face and oral care, able to manipulate containers LB dressing: SBA bed level using bridging technique with increased independence noted Footwear: SBA for slip on shoes, although total A for TED hose and ACE wraps Transfers: Min A stand pivot from bed><W/C using RUE on W/C arm rest for support  Other Treatments: OT placed lidocane patch on pt left ribs for decreased pain and pain management   Pt seated in W/C at end of session with W/C alarm donned, call light within  reach and 4Ps assessed. Pt encouraged to eat breakfast for increased energy and nutrition.     Session 2 General: "I'm ready to shower" Pt seated in W/C upon OT arrival, agreeable to OT.  Pain:  3/10 pain reported in left ribs, shower, activity, intermittent rest breaks, distractions provided for pain management, pt reports tolerable to proceed. OT put lidocane patch on on pt for pain management after bathing   ADL: Pt participated in ADL activities this session for increased independence with tasks and increased activity tolerance. Pt completed the following tasks with levels of care listed below: Bed mobility: SBA seated EOB>supine with safe technique and flat bed UB dressing: SBA donning/doffing overhead shirt, VC for dressing affected arm first LB dressing: CGA standing at grab bar in shower to manage pants over waist, using figure 4 method to [place] over feet Footwear: SBA slipping on/off shoes. Total A for removing ACE and TED hose  Shower transfer: CGA stand pivot transfer from W/C><TTB using grab bar with RUE Bathing: SBA seated on TTB, able to manipulate containers with BUE Transfers: CGA stand pivot using bed rail from W/C>EOB with no LOB/SOB   Pt supine in bed with bed alarm activated, 2 bed rails up, call light within reach and 4Ps assessed.   Therapy/Group: Individual Therapy  Velia Meyer, OTD, OTR/L 01/04/2024, 3:40 PM

## 2024-01-04 NOTE — Progress Notes (Signed)
 PROGRESS NOTE   Subjective/Complaints:  Pt reports took nebs yesterday x3 and coughed more afterwards, but breathing easier.   Denies SOB since restarted Entresto Pain pain that radiates from L shoulder down to L lateral chest- no SOB?DOE, no sweating, no L arm or jaw pain- is reproducible.  Thinks it's due to using platform walker so much- walked 340ft yesterday.  LBM yesterday- good BM.  Asking for more pain meds since hurting so much today- it's like didn't get pain meds earlier.    ROS:    Pt denies SOB, abd pain, CP, N/V/C/D, and vision changes   (+) Cough Left sided rib pain   Objective:   No results found.    Recent Labs    01/04/24 0613  WBC 7.8  HGB 11.9*  HCT 35.8*  PLT 297    Recent Labs    01/04/24 0613  NA 132*  K 5.1  CL 104  CO2 23  GLUCOSE 121*  BUN 29*  CREATININE 1.45*  CALCIUM 9.1    Intake/Output Summary (Last 24 hours) at 01/04/2024 0840 Last data filed at 01/04/2024 0523 Gross per 24 hour  Intake 1020 ml  Output 1350 ml  Net -330 ml        Physical Exam: Vital Signs Blood pressure 109/75, pulse 77, temperature 98.9 F (37.2 C), resp. rate 16, height 5\' 9"  (1.753 m), weight 90 kg, SpO2 94%.    General: awake, alert, appropriate,  sitting up in bed; c/o more pain in LUE; NAD HENT: conjugate gaze; oropharynx moist CV: regular rate and rhythm; no JVD Pulmonary: CTA B/L; no W/R/R- good air movement on exam, but audibly mild wheezing- d/w RN GI: soft, NT, ND, (+)BS- more normoactive Psychiatric: appropriate- slightly anxious Neurological: Ox3  MSK- L shoulder in sling- significant TTP over L anterior shoulder and L sided ribs- reproducible pain Skin: No evidence of breakdown, no evidence of rash MSK- bilateral shoulders symmetric , no subluxation no pain to palpation  Less dysarthric this AM-  Neurologic: Cranial nerves II through XII intact, motor strength is 5/5  in RIght  deltoid, bicep, tricep, grip,5/5 right and 4/5 left  hip flexor, knee extensors, ankle dorsiflexor and plantar flexor Left grip 4/5   Assessment/Plan: 1. Functional deficits which require 3+ hours per day of interdisciplinary therapy in a comprehensive inpatient rehab setting. Physiatrist is providing close team supervision and 24 hour management of active medical problems listed below. Physiatrist and rehab team continue to assess barriers to discharge/monitor patient progress toward functional and medical goals  Care Tool:  Bathing              Bathing assist Assist Level: Maximal Assistance - Patient 24 - 49%     Upper Body Dressing/Undressing Upper body dressing   What is the patient wearing?: Pull over shirt    Upper body assist Assist Level: Maximal Assistance - Patient 25 - 49%    Lower Body Dressing/Undressing Lower body dressing            Lower body assist Assist for lower body dressing: Maximal Assistance - Patient 25 - 49%     Toileting Toileting    Toileting assist  Assist for toileting: Maximal Assistance - Patient 25 - 49%     Transfers Chair/bed transfer  Transfers assist     Chair/bed transfer assist level: Contact Guard/Touching assist     Locomotion Ambulation   Ambulation assist      Assist level: Contact Guard/Touching assist (close stand-by) Assistive device: Walker-platform Max distance: 350   Walk 10 feet activity   Assist     Assist level: Contact Guard/Touching assist Assistive device: Walker-platform   Walk 50 feet activity   Assist Walk 50 feet with 2 turns activity did not occur: Safety/medical concerns  Assist level: Contact Guard/Touching assist Assistive device: Walker-platform    Walk 150 feet activity   Assist Walk 150 feet activity did not occur: Safety/medical concerns  Assist level: Contact Guard/Touching assist Assistive device: Walker-platform    Walk 10 feet on uneven surface   activity   Assist     Assist level: Minimal Assistance - Patient > 75% Assistive device: Walker-platform   Wheelchair     Assist Is the patient using a wheelchair?: Yes Type of Wheelchair: Manual    Wheelchair assist level: Supervision/Verbal cueing Max wheelchair distance: 200+    Wheelchair 50 feet with 2 turns activity    Assist        Assist Level: Supervision/Verbal cueing   Wheelchair 150 feet activity     Assist      Assist Level: Supervision/Verbal cueing   Blood pressure 109/75, pulse 77, temperature 98.9 F (37.2 C), resp. rate 16, height 5\' 9"  (1.753 m), weight 90 kg, SpO2 94%.  Medical Problem List and Plan: 1. Functional deficits secondary to TBI             -patient may  shower             -ELOS/Goals: 14-18d Sup  D/c 3/19  Con't CIR PT, OT and SLP  Wait on using platform walker today- focus on other gait devices since L shoulder hurting so much 2.  Antithrombotics: -DVT/anticoagulation:  Mechanical: Sequential compression devices, below knee Bilateral lower extremities 3/11- will start Eliquis- 2.5 mg BID since doesn't want lovenox injections             -antiplatelet therapy: N/A--Plavix to be held for a week per Dr. Jordan Likes --Resume on 03/04  3. Pain Management:  On IV hydromorphone and Oxycodone prn.              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   3/4- has Percocet ordered BID as well as q4 hours prn- con't regimen and monitor- hasn't had this AM, so asked him to call for meds- last got 6 hours prior   3/5- changed percocet to 1-2 tabs q4 hours prn  3/7- pain doing ok- con't regimen  3/8- pain controlled- con't regimen  3/9- pain worse this Am- will add lidoderm patches- 3of them at night per pt request  3/13- changed pain meds back to 5-10 mg oxy q4 hours prn for pain and ocn't Percocet 7.5 mg BID- since having more shoulder/rib pain this AM  - although in L chest appears  to be reproducible- no Sweating, SOB/DOE on it nor L jaw or L arm pain- just pain associated with rib fx's and L shoulder pain from dislocation- could be from Platform walker- will ask therapy to wai ton platform walker today 4. Mood/Behavior/Sleep: LCSW to follow for evaluation and support.              -  antipsychotic agents: N/A   5. Neuropsych/cognition: This patient is capable of making decisions on his own behalf. 6. Skin/Wound Care: Routine pressure measures 7. Fluids/Electrolytes/Nutrition: Monitor I/O. Check CMET in am 8.  Left shoulder dislocation: NWB with sling X 2 weeks. Follow up with ortho in 2 weeks.  Dr Ave Filter did L RTC repair several years ago 9.  Nasal Fracture: Continue Keflex thru 03/05-->follow up with ENT in a week 10.  Pneumonia- Left chest wall pain/SOB: Wean oxygen. Encourage pulmonary hygiene  3/4- wasn't wearing this AM  3/8- off O2 11. Chronic HFrEF: Compensated. Daily weight and strict I/O             --ON Jardiance, Metoprolol, Pravastatin, Spironolactone, Entresto  3/4- ordered daily weights-will monitor for trend  3/7- weight down ~ 0.8 kg- off jardiance now will monitor  3/8- weight stable- con't to monitor  3/9- weight up 1 kg- will monitor trend  3/11- Weight up 6.5 lbs in 1 day- will Restart Entresto, but not sure if weight his real weight- will recheck in AM  3/12- weight back down to 89 kg- con't regimen  3/13- Weight 90 kg today- will monitor trend- concerned about giving Lasix because Cr is already at 1.45 up from baseline of 1.1 12. Anxiety d/o: Continue Klonopin prn 13. Insomnia: On Restoril 30 mg/hs 14. Wheezing: Hx smoking plus not taking deep breaths due to left rib painFlutter valve qid for pulmonary hygiene. Will schedule  duo nebs qid for now. 3/12- Con't nebs prn- since was switched from scheduled to prn by respiratory- asked pt to ask for and this AM, asked nursing to give.  3/13- added codeine cough meds for cough- nebs prn   15.  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 Thrush  3/4- will give Diflucan 200 mg x1 and then 100 mg daily x 6 days- might need to increase dose if doesn't improve.   3/5- will add Magic mouthwash with lidocaine- QID- for 7 days  3/7- Increased Diflucan to 200mg  yesterday- 15% better now- con't regimen  No oral lesions, overall better  17. Constipation  3/4- will give Sorbitol 30 mg x1 to get him to have BM after therapy- has been 3 days.   3/5- had good BM- con't regimen  3/7- will give Sorbitol since it's been 3 days on Senna 2 tabs BID and Miralax- con't regimen otherwise  3/8- LBM 2 days ago- if no results by tomorrow, will give Sorbitol  3/9- BM x 2  3/12- LBM 3 days ago- per chart- pt wants sorbitol- will give after therapy today  3/13- had good BM yesterday 18. AKI  3/4- will recheck labs Thursday since with HF, Jahel't want him to get too wet.   3/5- asked pt to drink a little more than currently drinking  3/6- Pt's BUN up to 33 and Cr up to 1.50- think it's because hurts to drink/eat, so will give some IVFs to tune back up- usually Cr <1 and is now 1.5- will give 75cc/hour starting after therapy and do for 15 hours- won't do more, since has HF.       Latest Ref Rng & Units 01/04/2024    6:13 AM 01/01/2024    5:44 AM 12/31/2023    7:44 AM  BMP  Glucose 70 - 99 mg/dL 324  401  027   BUN 6 - 20 mg/dL 29  27  26   Creatinine 0.61 - 1.24 mg/dL 8.65  7.84  6.96   Sodium 135 - 145 mmol/L 132  132  132   Potassium 3.5 - 5.1 mmol/L 5.1  4.7  4.4   Chloride 98 - 111 mmol/L 104  104  100   CO2 22 - 32 mmol/L 23  22  22    Calcium 8.9 - 10.3 mg/dL 9.1  8.9  9.1    Still above baseline of 1.1 but appears to be mainly hydration related , cont to push po fluids  3/11- will restart Entresto, but not Jardiance- still not drinking as much as should  3/13- BUN 29 and Cr 1.45- staying stable, not sure if this is new  baseline, or not? 19. Wet cough  3/6- Will increase Tessalon to 200 mg TID scheduled  -independently reviewed CXR- Greely't see pneumonia- however will wait for final read  3/11- added Robitussin- and Albuterol nebs- has Duonebs prn- but has to ask for either of them  3/13- pt reports when uses nebs, coughs more for a few minutes, but taking Codeine cough meds- help ssome as well  20. Orthostatic hypotension vs dizziness from double vision  3/8- will try TEDs and abd binder and ACE wraps- if doesn't improve, will add Midodrine.   3/9- patch for 1 eye helping as well as TEDs/ACE wraps- not tried abd binder yet  3/13- per therapy, less dizziness- since walked 350 ft yesterday- will monitor Vitals:   01/03/24 2045 01/04/24 0515  BP: 123/75 109/75  Pulse: (!) 105 77  Resp: 16 16  Temp: 98.2 F (36.8 C) 98.9 F (37.2 C)  SpO2: 97% 94%   Baseline BPs ok , no orthostatic changes per PT note on 3/9,   3/11- No more dizziness with TEDs.    Will need nebs machine at home if possible due to pneumonia.     I spent a total of 38   minutes on total care today- >50% coordination of care- due to  D/w pt at length about Chest pain- appears noncardiac- also d/w nursing about Sx's- and to call if pain gets worse/any SOB, sweating, etc  LOS: 10 days A FACE TO FACE EVALUATION WAS PERFORMED  Erhard Senske 01/04/2024, 8:40 AM

## 2024-01-04 NOTE — Progress Notes (Signed)
 Physical Therapy Session Note  Patient Details  Name: Dustin Sampson MRN: [id number] Date of Birth: 1965-11-12  Today's Date: 01/04/2024 PT Individual Time: 2956-2130 PT Individual Time Calculation (min): 70 min   Short Term Goals: Week 2:  PT Short Term Goal 1 (Week 2): STG=LTG d/t ELOS  Skilled Therapeutic Interventions/Progress Updates:     Pt received supine and agreeable to therapy. Pt notes unrated pain in L shoulder but does not want more pain medication.   Pt notes he did not get much sleep and his "shakiness" seems worse today. Had good AM PT session. Speech is more slurred than yesterday.   Pt performs ambulatory transfer with R hemiwalker to w/c - increased ataxia and several minor LOB requiring min-modA to correct. Pt dependently transported to dayroom for energy conservation.  Kinetron for LE reciprocal movement pattern to improve ataxia: - 3x3 minutes at 30 cm/s, 1 minute rest between bouts and RPE of 4-5/10  Abrasion noted on back of pt's head, nsg notified and took measurements. Pt dependently transported to [employer] for gait training. Pt ambulated ~20' with hemiwalker and CGA, pt had difficulty sequencing hemiwalker and displayed wide BOS, BLE ataxia, and excessive trunk flexion. Pt dependently transported back to dayroom for gait training with quad-cane.   Pt ambulated 65' with wide based quad-cane, white hemi sling, and CGA, had improved dynamic standing balance and sequencing of cane with verbal cues for timing, improved upright posture and BOS width. Pt dependently transported back to room and left seated in w/c with seat belt alarm on and all needs in reach.  Therapy Documentation Precautions:  Precautions Precautions: Fall Shoulder Interventions: Shoulder sling/immobilizer Precaution/Restrictions Comments: ataxia Required Braces or Orthoses: Sling (Lt shoulder) Restrictions Weight Bearing Restrictions Per Provider Order: Yes LUE Weight Bearing Per  Provider Order: Non weight bearing Other Position/Activity Restrictions: sling for 2 weeks until ortho follow-up General:     Therapy/Group: Individual Therapy  Collins Scotland 01/04/2024, 4:15 PM

## 2024-01-04 NOTE — Plan of Care (Signed)
  Problem: Consults Goal: RH GENERAL PATIENT EDUCATION Description: See Patient Education module for education specifics. Outcome: Progressing   Problem: RH BOWEL ELIMINATION Goal: RH STG MANAGE BOWEL WITH ASSISTANCE Description: STG Manage Bowel with  supervision Assistance. Outcome: Progressing Goal: RH STG MANAGE BOWEL W/MEDICATION W/ASSISTANCE Description: STG Manage Bowel with Medication with  supervision Assistance. Outcome: Progressing   Problem: RH BLADDER ELIMINATION Goal: RH STG MANAGE BLADDER WITH ASSISTANCE Description: STG Manage Bladder With supervision  Assistance Outcome: Progressing   Problem: RH SKIN INTEGRITY Goal: RH STG SKIN FREE OF INFECTION/BREAKDOWN Description:  Manage skin free of infection/ breakdown  integrity with supervision   Outcome: Progressing   Problem: RH SAFETY Goal: RH STG ADHERE TO SAFETY PRECAUTIONS W/ASSISTANCE/DEVICE Description: STG Adhere to Safety Precautions With supervision Assistance/Device. Outcome: Progressing   Problem: RH PAIN MANAGEMENT Goal: RH STG PAIN MANAGED AT OR BELOW PT'S PAIN GOAL Description: <4 w/ prns Outcome: Progressing   Problem: RH KNOWLEDGE DEFICIT GENERAL Goal: RH STG INCREASE KNOWLEDGE OF SELF CARE AFTER HOSPITALIZATION Description: Manage increase knowledge of self care after hospitalization with supervision using educational materials prrovided Outcome: Progressing

## 2024-01-04 NOTE — Progress Notes (Signed)
 Physical Therapy Session Note  Patient Details  Name: Dustin Sampson MRN: [id number] Date of Birth: 1966-02-25  Today's Date: 01/04/2024 PT Individual Time: 0915-1000 PT Individual Time Calculation (min): 45 min   Short Term Goals: Week 1:  PT Short Term Goal 1 (Week 1): Pt will transfer sit to stand w/ CGA consistently safely. PT Short Term Goal 1 - Progress (Week 1): Met PT Short Term Goal 2 (Week 1): Pt will transfer w/c <> bed w/ CGA. PT Short Term Goal 2 - Progress (Week 1): Met PT Short Term Goal 3 (Week 1): Pt will amb w/ LRAD and CGA x 75' PT Short Term Goal 3 - Progress (Week 1): Met  Skilled Therapeutic Interventions/Progress Updates:    pt received in w/c and agreeable to therapy. Reports 3/10 pain, premedicated. Rest and positioning provided as needed.   Pt transported to therapy gym for time management and energy conservation. Pt reports 4/10 dizziness before starting activity, so performed LE exercise (calf raises, marches, LAQ) to improve OH with mild improvement. Monitored symptoms throughout.   Pt performed x 2 bouts of navigating ramp and 2 turns around cones for dynamic balance and stability on uneven terrain using hemi walker. Pt able to navigate with CGA overall but had x LOB requiring max to recover and x1 requiring mod to recover on 2nd bout.   Pt returned to room propelling chair with BLE and RUE with supervision. Pt then set up for Stand pivot transfer using bed rail, but required cues for safety d/t poor set up(chair too close) pt expressed understanding. Pt returned to bed and was left with all needs in reach and alarm active.   Therapy Documentation Precautions:  Precautions Precautions: Fall Shoulder Interventions: Shoulder sling/immobilizer Precaution/Restrictions Comments: ataxia Required Braces or Orthoses: Sling (Lt shoulder) Restrictions Weight Bearing Restrictions Per Provider Order: Yes LUE Weight Bearing Per Provider Order: Non weight bearing Other  Position/Activity Restrictions: sling for 2 weeks until ortho follow-up General:       Therapy/Group: Individual Therapy  Juluis Rainier 01/04/2024, 3:56 PM

## 2024-01-04 NOTE — Progress Notes (Signed)
 Nurse called reporting patient is anxious, Dr Berline Chough note was reviewed. One time dose of clonazepam ordered. We will continue to monitor, nurse verbalized understanding

## 2024-01-05 ENCOUNTER — Inpatient Hospital Stay (HOSPITAL_COMMUNITY)

## 2024-01-05 DIAGNOSIS — R4701 Aphasia: Secondary | ICD-10-CM

## 2024-01-05 LAB — GLUCOSE, CAPILLARY
Glucose-Capillary: 117 mg/dL — ABNORMAL HIGH (ref 70–99)
Glucose-Capillary: 137 mg/dL — ABNORMAL HIGH (ref 70–99)
Glucose-Capillary: 170 mg/dL — ABNORMAL HIGH (ref 70–99)
Glucose-Capillary: 128 mg/dL — ABNORMAL HIGH (ref 70–99)

## 2024-01-05 LAB — BASIC METABOLIC PANEL
Anion gap: 10 (ref 5–15)
BUN: 34 mg/dL — ABNORMAL HIGH (ref 6–20)
CO2: 22 mmol/L (ref 22–32)
Calcium: 9.3 mg/dL (ref 8.9–10.3)
Chloride: 97 mmol/L — ABNORMAL LOW (ref 98–111)
Creatinine, Ser: 1.6 mg/dL — ABNORMAL HIGH (ref 0.61–1.24)
GFR, Estimated: 50 mL/min — ABNORMAL LOW (ref >60)
Glucose, Bld: 143 mg/dL — ABNORMAL HIGH (ref 70–99)
Potassium: 5.2 mmol/L — ABNORMAL HIGH (ref 3.5–5.1)
Sodium: 129 mmol/L — ABNORMAL LOW (ref 135–145)

## 2024-01-05 LAB — BRAIN NATRIURETIC PEPTIDE: B Natriuretic Peptide: 213.5 pg/mL — ABNORMAL HIGH (ref 0.0–100.0)

## 2024-01-05 MED ORDER — IPRATROPIUM-ALBUTEROL 0.5-2.5 (3) MG/3ML IN SOLN
3.0000 mL | Freq: Four times a day (QID) | RESPIRATORY_TRACT | Status: DC
  Administered 2024-01-05 – 2024-01-09 (×13): 3 mL via RESPIRATORY_TRACT
  Filled 2024-01-05 (×13): qty 3

## 2024-01-05 MED ORDER — SACUBITRIL-VALSARTAN 24-26 MG PO TABS
1.0000 | ORAL_TABLET | Freq: Two times a day (BID) | ORAL | Status: DC
  Administered 2024-01-05 – 2024-01-10 (×10): 1 via ORAL
  Filled 2024-01-05 (×10): qty 1

## 2024-01-05 MED ORDER — PREDNISONE 20 MG PO TABS
20.0000 mg | ORAL_TABLET | Freq: Every day | ORAL | Status: AC
  Administered 2024-01-05 – 2024-01-07 (×3): 20 mg via ORAL
  Filled 2024-01-05 (×3): qty 1

## 2024-01-05 NOTE — Progress Notes (Signed)
 Occupational Therapy Session Note  Patient Details  Name: Dustin Sampson MRN: [id number] Date of Birth: Sep 18, 1966  Today's Date: 01/05/2024 OT Individual Time: 1045-1110 OT Individual Time Calculation (min): 25 min  and Today's Date: 01/05/2024 OT Missed Time: 50 Minutes Missed Time Reason: X-Ray   Short Term Goals: Week 2:  OT Short Term Goal 1 (Week 2): STG=LTG d/t ELOS  Skilled Therapeutic Interventions/Progress Updates:    Pt resting in bed upon arrival. Pt reports he is "not having a good morning." Pt reports earlier while sitting in w/c he became "sweaty" and felt bad. Vitals were checked and he was returned to bed. Pt requested "anxiety meds" and RN notified. Meds admin just as pt was getting ready to go to xray. Discussed change in WBing status of LUE. Pt reports earlier when he was sitting EOB he used his LUE to help stabilize and he noticed increase in discomfort. Pt had sling applied at that time. Discussed pending discharge 3/19 and recommendation for daily schedule OOB in w/c. Pt in agreement. Pt remained in bed for transport to xray. Pt missed 50 mins skilled OT services. Will check back as schedule permits.   Therapy Documentation Precautions:  Precautions Precautions: Fall Shoulder Interventions: Shoulder sling/immobilizer Precaution/Restrictions Comments: ataxia Required Braces or Orthoses: Sling (Lt shoulder) Restrictions Weight Bearing Restrictions Per Provider Order: Yes LUE Weight Bearing Per Provider Order: Non weight bearing Other Position/Activity Restrictions: sling for 2 weeks until ortho follow-up General: General OT Amount of Missed Time: 50 Minutes Pain: Pt reports 7/10 Lt shoulder and Lt rib cage pain; meds admin prior to therapy, sling for LUE    Therapy/Group: Individual Therapy  Rich Brave 01/05/2024, 11:29 AM

## 2024-01-05 NOTE — Plan of Care (Signed)
  Problem: Consults Goal: RH GENERAL PATIENT EDUCATION Description: See Patient Education module for education specifics. Outcome: Progressing   Problem: RH BOWEL ELIMINATION Goal: RH STG MANAGE BOWEL WITH ASSISTANCE Description: STG Manage Bowel with  supervision Assistance. Outcome: Progressing Goal: RH STG MANAGE BOWEL W/MEDICATION W/ASSISTANCE Description: STG Manage Bowel with Medication with  supervision Assistance. Outcome: Progressing   Problem: RH BLADDER ELIMINATION Goal: RH STG MANAGE BLADDER WITH ASSISTANCE Description: STG Manage Bladder With supervision  Assistance Outcome: Progressing   Problem: RH SKIN INTEGRITY Goal: RH STG SKIN FREE OF INFECTION/BREAKDOWN Description:  Manage skin free of infection/ breakdown  integrity with supervision   Outcome: Progressing   Problem: RH SAFETY Goal: RH STG ADHERE TO SAFETY PRECAUTIONS W/ASSISTANCE/DEVICE Description: STG Adhere to Safety Precautions With supervision Assistance/Device. Outcome: Progressing   Problem: RH PAIN MANAGEMENT Goal: RH STG PAIN MANAGED AT OR BELOW PT'S PAIN GOAL Description: <4 w/ prns Outcome: Progressing   Problem: RH KNOWLEDGE DEFICIT GENERAL Goal: RH STG INCREASE KNOWLEDGE OF SELF CARE AFTER HOSPITALIZATION Description: Manage increase knowledge of self care after hospitalization with supervision using educational materials prrovided Outcome: Progressing

## 2024-01-05 NOTE — Progress Notes (Addendum)
 PROGRESS NOTE   Subjective/Complaints:  Pt reports didn't ask for Combivent and more wheezing today Pain still bad, but better with increase in pain meds.  More in L anterior shoulder and L lateral chest-  Still reproducible and no sweating/SOB- "feels like it cracks and moves" sometimes.   LBM 2 days ago Said at home wa sin bed 60-70% of time due to ataxia.     ROS:    Pt denies SOB, abd pain, CP, N/V/C/D, and vision changes   (+) Cough- more wheezing Left sided rib pain- and anterior shoulder pain   Objective:   No results found.    Recent Labs    01/04/24 0613  WBC 7.8  HGB 11.9*  HCT 35.8*  PLT 297    Recent Labs    01/04/24 0613  NA 132*  K 5.1  CL 104  CO2 23  GLUCOSE 121*  BUN 29*  CREATININE 1.45*  CALCIUM 9.1    Intake/Output Summary (Last 24 hours) at 01/05/2024 0836 Last data filed at 01/05/2024 0809 Gross per 24 hour  Intake 957 ml  Output 1350 ml  Net -393 ml        Physical Exam: Vital Signs Blood pressure 101/67, pulse 77, temperature 98.3 F (36.8 C), resp. rate 16, height 5\' 9"  (1.753 m), weight 90.4 kg, SpO2 95%.      General: awake, alert, appropriate, sitting u in bed;  NAD HENT: conjugate gaze; oropharynx moist CV: regular rate and rhythm; no JVD Pulmonary: CTA B/L; no W/R/R- good air movement GI: soft, NT, ND, (+)BS- hypoactive Psychiatric: appropriate Neurological: Ox3  MSK- TTP over anterior shoulder and L lateral chest today significant TTP over L anterior shoulder and L sided ribs- reproducible pain Skin: No evidence of breakdown, no evidence of rash MSK- bilateral shoulders symmetric , no subluxation no pain to palpation  Less dysarthric this AM-  Neurologic: Cranial nerves II through XII intact, motor strength is 5/5 in RIght  deltoid, bicep, tricep, grip,5/5 right and 4/5 left  hip flexor, knee extensors, ankle dorsiflexor and plantar flexor Left  grip 4/5   Assessment/Plan: 1. Functional deficits which require 3+ hours per day of interdisciplinary therapy in a comprehensive inpatient rehab setting. Physiatrist is providing close team supervision and 24 hour management of active medical problems listed below. Physiatrist and rehab team continue to assess barriers to discharge/monitor patient progress toward functional and medical goals  Care Tool:  Bathing              Bathing assist Assist Level: Maximal Assistance - Patient 24 - 49%     Upper Body Dressing/Undressing Upper body dressing   What is the patient wearing?: Pull over shirt    Upper body assist Assist Level: Maximal Assistance - Patient 25 - 49%    Lower Body Dressing/Undressing Lower body dressing            Lower body assist Assist for lower body dressing: Maximal Assistance - Patient 25 - 49%     Toileting Toileting    Toileting assist Assist for toileting: Maximal Assistance - Patient 25 - 49%     Transfers Chair/bed transfer  Transfers assist  Chair/bed transfer assist level: Contact Guard/Touching assist     Locomotion Ambulation   Ambulation assist      Assist level: Contact Guard/Touching assist (close stand-by) Assistive device: Walker-platform Max distance: 350   Walk 10 feet activity   Assist     Assist level: Contact Guard/Touching assist Assistive device: Walker-platform   Walk 50 feet activity   Assist Walk 50 feet with 2 turns activity did not occur: Safety/medical concerns  Assist level: Contact Guard/Touching assist Assistive device: Walker-platform    Walk 150 feet activity   Assist Walk 150 feet activity did not occur: Safety/medical concerns  Assist level: Contact Guard/Touching assist Assistive device: Walker-platform    Walk 10 feet on uneven surface  activity   Assist     Assist level: Minimal Assistance - Patient > 75% Assistive device: Walker-platform    Wheelchair     Assist Is the patient using a wheelchair?: Yes Type of Wheelchair: Manual    Wheelchair assist level: Supervision/Verbal cueing Max wheelchair distance: 200+    Wheelchair 50 feet with 2 turns activity    Assist        Assist Level: Supervision/Verbal cueing   Wheelchair 150 feet activity     Assist      Assist Level: Supervision/Verbal cueing   Blood pressure 101/67, pulse 77, temperature 98.3 F (36.8 C), resp. rate 16, height 5\' 9"  (1.753 m), weight 90.4 kg, SpO2 95%.  Medical Problem List and Plan: 1. 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  Antithrombotics: -DVT/anticoagulation:  Mechanical: Sequential compression devices, below knee Bilateral lower extremities 3/11- will start Eliquis- 2.5 mg BID since doesn't want lovenox injections             -antiplatelet therapy: N/A--Plavix to be held for a week per Dr. Jordan Likes --Resume on 03/04  3. Pain Management:  On IV hydromorphone and Oxycodone prn.              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   3/4- has Percocet ordered BID as well as q4 hours prn- con't regimen and monitor- hasn't had this AM, so asked him to call for meds- last got 6 hours prior   3/5- changed percocet to 1-2 tabs q4 hours prn  3/7- pain doing ok- con't regimen  3/8- pain controlled- con't regimen  3/9- pain worse this Am- will add lidoderm patches- 3of them at night per pt request  3/13- changed pain meds back to 5-10 mg oxy q4 hours prn for pain and ocn't Percocet 7.5 mg BID- since having more shoulder/rib pain this AM  - although in L chest appears to be reproducible- no Sweating, SOB/DOE on it nor L jaw or L arm pain- just pain associated with rib fx's and L shoulder pain from dislocation- could be from Platform walker- will ask therapy to wai ton platform  walker today  3/14- pain has moved more to L lateral chest and anterior shoulder- which is c/w MSK pain- doing better with increased pain meds 4. Mood/Behavior/Sleep: LCSW to follow for evaluation and support.              -antipsychotic agents: N/A   5. Neuropsych/cognition: This patient is capable of making decisions  on his own behalf. 6. Skin/Wound Care: Routine pressure measures 7. Fluids/Electrolytes/Nutrition: Monitor I/O. Check CMET in am 8.  Left shoulder dislocation: NWB with sling X 2 weeks. Follow up with ortho in 2 weeks.  Dr Ave Filter did L RTC repair several years ago 9.  Nasal Fracture: Continue Keflex thru 03/05-->follow up with ENT in a week 10.  Pneumonia- Left chest wall pain/SOB: Wean oxygen. Encourage pulmonary hygiene  3/4- wasn't wearing this AM  3/8- off O2 11. Chronic HFrEF: Compensated. Daily weight and strict I/O             --ON Jardiance, Metoprolol, Pravastatin, Spironolactone, Entresto  3/4- ordered daily weights-will monitor for trend  3/7- weight down ~ 0.8 kg- off jardiance now will monitor  3/8- weight stable- con't to monitor  3/9- weight up 1 kg- will monitor trend  3/11- Weight up 6.5 lbs in 1 day- will Restart Entresto, but not sure if weight his real weight- will recheck in AM  3/12- weight back down to 89 kg- con't regimen  3/13- Weight 90 kg today- will monitor trend- concerned about giving Lasix because Cr is already at 1.45 up from baseline of 1.1 12. Anxiety d/o: Continue Klonopin prn 13. Insomnia: On Restoril 30 mg/hs 14. Wheezing: Hx smoking plus not taking deep breaths due to left rib painFlutter valve qid for pulmonary hygiene. Will schedule  duo nebs qid for now. 3/12- Con't nebs prn- since was switched from scheduled to prn by respiratory- asked pt to ask for and this AM, asked nursing to give.  3/13- added codeine cough meds for cough- nebs prn  3/14- coughing/wheezing more- will add Prednisone 20 mg daily x3 days and monitor- might need  CXR, additional work up if doesn't improve- nebs stopped yesterday since cannot go home on it- but will change Combivent to QID scheduled  15.  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 Thrush  3/4- will give Diflucan 200 mg x1 and then 100 mg daily x 6 days- might need to increase dose if doesn't improve.   3/5- will add Magic mouthwash with lidocaine- QID- for 7 days  3/7- Increased Diflucan to 200mg  yesterday- 15% better now- con't regimen  No oral lesions, overall better  17. Constipation  3/4- will give Sorbitol 30 mg x1 to get him to have BM after therapy- has been 3 days.   3/5- had good BM- con't regimen  3/7- will give Sorbitol since it's been 3 days on Senna 2 tabs BID and Miralax- con't regimen otherwise  3/8- LBM 2 days ago- if no results by tomorrow, will give Sorbitol  3/9- BM x 2  3/12- LBM 3 days ago- per chart- pt wants sorbitol- will give after therapy today  3/13- had good BM yesterday  3/14- LBM 2 days ago 18. AKI  3/4- will recheck labs Thursday since with HF, Jarin't want him to get too wet.   3/5- asked pt to drink a little more than currently drinking  3/6- Pt's BUN up to 33 and Cr up to 1.50- think it's because hurts to drink/eat, so will give some IVFs to tune back up- usually Cr <1 and is now 1.5- will give 75cc/hour starting after therapy and do for 15 hours- won't do more, since has HF.       Latest Ref Rng & Units 01/04/2024    6:13 AM 01/01/2024  5:44 AM 12/31/2023    7:44 AM  BMP  Glucose 70 - 99 mg/dL 161  096  045   BUN 6 - 20 mg/dL 29  27  26    Creatinine 0.61 - 1.24 mg/dL 4.09  8.11  9.14   Sodium 135 - 145 mmol/L 132  132  132   Potassium 3.5 - 5.1 mmol/L 5.1  4.7  4.4   Chloride 98 - 111 mmol/L 104  104  100   CO2 22 - 32 mmol/L 23  22  22    Calcium 8.9 - 10.3 mg/dL 9.1  8.9  9.1    Still above baseline of 1.1 but appears to be mainly hydration related , cont to  push po fluids  3/11- will restart Entresto, but not Jardiance- still not drinking as much as should  3/13- BUN 29 and Cr 1.45- staying stable, not sure if this is new baseline, or not? 19. Wet cough  3/6- Will increase Tessalon to 200 mg TID scheduled  -independently reviewed CXR- Laddie't see pneumonia- however will wait for final read  3/11- added Robitussin- and Albuterol nebs- has Duonebs prn- but has to ask for either of them  3/13- pt reports when uses nebs, coughs more for a few minutes, but taking Codeine cough meds- help ssome as well  20. Orthostatic hypotension vs dizziness from double vision  3/8- will try TEDs and abd binder and ACE wraps- if doesn't improve, will add Midodrine.   3/9- patch for 1 eye helping as well as TEDs/ACE wraps- not tried abd binder yet  3/13- per therapy, less dizziness- since walked 350 ft yesterday- will monitor Vitals:   01/04/24 2235 01/05/24 0502  BP: 120/76 101/67  Pulse: (!) 110 77  Resp:  16  Temp:  98.3 F (36.8 C)  SpO2:  95%   Baseline BPs ok , no orthostatic changes per PT note on 3/9,   3/11- No more dizziness with TEDs.     I spent a total of 41   minutes on total care today- >50% coordination of care- due to  D/w PA as well as nursing about nebs- and treatment  Addendum- will hold Aladactone because K+ is rising- esp in setting of elevated Cr- as well- monitor his labs as well as Cr/K+ and weight gain this weekend.   LOS: 11 days A FACE TO FACE EVALUATION WAS PERFORMED  Shadeed Colberg 01/05/2024, 8:36 AM

## 2024-01-05 NOTE — Progress Notes (Signed)
 Physical Therapy Session Note  Patient Details  Name: Dustin Sampson MRN: [id number] Date of Birth: Dec 03, 1965  Today's Date: 01/05/2024 PT Individual Time: 4098-1191 + 1415-1520 PT Individual Time Calculation (min): 42 min  + 65 min  Short Term Goals: Week 2:  PT Short Term Goal 1 (Week 2): STG=LTG d/t ELOS  Skilled Therapeutic Interventions/Progress Updates:      1st session: Pt in bed to start - agreeable to PT Tx. Reports ongoing rib pain - provided distraction and mobility for pain management.  Supine<>sitting EOB without assist with HOB raised. Donned compression socks and slip-on shoes with totalA for time management. Assisted to wheelchair with a awkward modA level stand pivot transfer - patient turning the opposite direction to transfer into the wheelchair. Educated patient on alternative strategies to prevent falls risk and improve funtional independence.   Donned LUE sling to encourage compliance with WB restrictions during treatment.  Transported to main rehab gym. Sit<>stand to QC with minA for balance only. Short distance gait training ~59ft with minA and QC with assist primarily for balance and trunk support. Pt demonstrates an excessively wide BOS with posterior bias and weight on heels. Has a step-to gait pattern and overcrowds the QC vs his R foot. Worked on awareness for QC placement to prevent tripping hazzard.   Focused on stair training using 6" steps and 1 hand rail to simulate home entrance. Pt navigated up/down x4 + x4 (seated rest break) with heavy minA. Pt demonstrates step-to pattern for both directions. Ascends the stairs forwards and descends the stairs sideways. Educated on foot placement while descending the stairs to avoid overcrowding of his feet and help with balance while stepping down.   Returned to his room and patient left sitting upright in his wheelchair. All his needs met and pt made aware of daily therapy schedule.     2nd session: Pt sleeping in  bed on arrival. Pt awakens to voice and needs ++ time to fully waken to participate in therapy session. No reports of pain. Reports having a "panic attack" earlier this AM with OT but is feeling better.   Supine<>sitting EOB with supervision using hospital bed features. Donned LUE sling with totalA. Able to slip on his shoes with setupA as he sat EOB. MinA for stand pivot transfer using the QC while transferring to the L - improved safety and pacing with cues for stepping strategies and pivoting his hips.   Transported to day room rehab gym and setup for gait training. Bright orange cones spaced 55ft apart linearly with chairs at each cone to allow for safe space to sit incase of fatigue. Pt requires minA for sit<>stand to QC (primarily for balance only). Ambulates 86ft with minA and QC before needing a seated rest. Gait is ataxic with step-to pattern, wide BOS, and poor foot clearance. Highly unsteady and significant falls risk. Attempted to ambulate back to starting position after a seated rest break but patient reports too fatigued and worried about falling.   Assisted patient onto the [facility] with minA stand pivot transfer. Used seat belt for added safety. Completed 3x3 minutes with L35cm/sec resistance. Encouraged full AROM bilaterally, worked on cadence and coordination training with each step.   Returned to his wheelchair with minA stand pivot and then back to his room. Pt requesting to lie down in bed - assisted to bed with minA using hospital bed rail for trunk support and to assist with pivot. Able to lie himself down in bed without assist. All  needs met at end. Last 10 minutes missed 2/2 fatigue   Therapy Documentation Precautions:  Precautions Precautions: Fall Shoulder Interventions: Shoulder sling/immobilizer Precaution/Restrictions Comments: ataxia Required Braces or Orthoses: Sling (Lt shoulder) Restrictions Weight Bearing Restrictions Per Provider Order: Yes LUE Weight Bearing  Per Provider Order: Non weight bearing Other Position/Activity Restrictions: sling for 2 weeks until ortho follow-up General:     Therapy/Group: Individual Therapy  Orrin Brigham 01/05/2024, 7:48 AM

## 2024-01-06 DIAGNOSIS — R4701 Aphasia: Secondary | ICD-10-CM

## 2024-01-06 LAB — GLUCOSE, CAPILLARY
Glucose-Capillary: 140 mg/dL — ABNORMAL HIGH (ref 70–99)
Glucose-Capillary: 143 mg/dL — ABNORMAL HIGH (ref 70–99)
Glucose-Capillary: 227 mg/dL — ABNORMAL HIGH (ref 70–99)
Glucose-Capillary: 163 mg/dL — ABNORMAL HIGH (ref 70–99)

## 2024-01-06 LAB — BASIC METABOLIC PANEL
Anion gap: 12 (ref 5–15)
BUN: 35 mg/dL — ABNORMAL HIGH (ref 6–20)
CO2: 16 mmol/L — ABNORMAL LOW (ref 22–32)
Calcium: 9.1 mg/dL (ref 8.9–10.3)
Chloride: 103 mmol/L (ref 98–111)
Creatinine, Ser: 1.57 mg/dL — ABNORMAL HIGH (ref 0.61–1.24)
GFR, Estimated: 51 mL/min — ABNORMAL LOW (ref >60)
Glucose, Bld: 135 mg/dL — ABNORMAL HIGH (ref 70–99)
Potassium: 4.7 mmol/L (ref 3.5–5.1)
Sodium: 131 mmol/L — ABNORMAL LOW (ref 135–145)

## 2024-01-06 NOTE — Progress Notes (Signed)
 PROGRESS NOTE   Subjective/Complaints:  Discussed labs at length today as well as CHF meds and renal fxn Some DOE but no orthopnea, + occ wheezes and cough non productive, still has Left ant rib pain with cough ROS:    Pt denies SOB, abd pain, CP, N/V/C/D, and vision changes   (+) Cough- more wheezing Left sided rib pain- and anterior shoulder pain   Objective:   DG Chest 2 View Result Date: 01/05/2024 CLINICAL DATA:  Wheezing. EXAM: CHEST - 2 VIEW COMPARISON:  12/28/2023, 12/25/2023 FINDINGS: Borderline hyperinflation. Resolved bilateral lung opacities from prior exam. No new airspace disease. No pleural effusion or pneumothorax. No pulmonary edema. Remote left rib fracture. IMPRESSION: Borderline hyperinflation. Resolved bilateral lung opacities from prior exam. No acute airspace disease. Electronically Signed   By: Narda Rutherford M.D.   On: 01/05/2024 14:39      Recent Labs    01/04/24 0613  WBC 7.8  HGB 11.9*  HCT 35.8*  PLT 297    Recent Labs    01/05/24 1230 01/06/24 0522  NA 129* 131*  K 5.2* 4.7  CL 97* 103  CO2 22 16*  GLUCOSE 143* 135*  BUN 34* 35*  CREATININE 1.60* 1.57*  CALCIUM 9.3 9.1    Intake/Output Summary (Last 24 hours) at 01/06/2024 0954 Last data filed at 01/06/2024 0230 Gross per 24 hour  Intake 477 ml  Output 1150 ml  Net -673 ml        Physical Exam: Vital Signs Blood pressure 109/81, pulse 84, temperature 98.3 F (36.8 C), resp. rate 16, height 5\' 9"  (1.753 m), weight 90.7 kg, SpO2 95%.   MSK- TTP over anterior shoulder and L lateral chest today significant TTP over L anterior shoulder and L sided ribs- reproducible pain Skin: No evidence of breakdown, no evidence of rash MSK- bilateral shoulders symmetric , no subluxation no pain to palpation  Less dysarthric this AM-  Neurologic: Cranial nerves II through XII intact, motor strength is 5/5 in RIght  deltoid, bicep,  tricep, grip,5/5 right and 4/5 left  hip flexor, knee extensors, ankle dorsiflexor and plantar flexor Left grip 4/5   Assessment/Plan: 1. Functional deficits which require 3+ hours per day of interdisciplinary therapy in a comprehensive inpatient rehab setting. Physiatrist is providing close team supervision and 24 hour management of active medical problems listed below. Physiatrist and rehab team continue to assess barriers to discharge/monitor patient progress toward functional and medical goals  Care Tool:  Bathing              Bathing assist Assist Level: Maximal Assistance - Patient 24 - 49%     Upper Body Dressing/Undressing Upper body dressing   What is the patient wearing?: Pull over shirt    Upper body assist Assist Level: Maximal Assistance - Patient 25 - 49%    Lower Body Dressing/Undressing Lower body dressing            Lower body assist Assist for lower body dressing: Maximal Assistance - Patient 25 - 49%     Toileting Toileting    Toileting assist Assist for toileting: Maximal Assistance - Patient 25 - 49%  Transfers Chair/bed transfer  Transfers assist     Chair/bed transfer assist level: Contact Guard/Touching assist     Locomotion Ambulation   Ambulation assist      Assist level: Contact Guard/Touching assist (close stand-by) Assistive device: Walker-platform Max distance: 350   Walk 10 feet activity   Assist     Assist level: Contact Guard/Touching assist Assistive device: Walker-platform   Walk 50 feet activity   Assist Walk 50 feet with 2 turns activity did not occur: Safety/medical concerns  Assist level: Contact Guard/Touching assist Assistive device: Walker-platform    Walk 150 feet activity   Assist Walk 150 feet activity did not occur: Safety/medical concerns  Assist level: Contact Guard/Touching assist Assistive device: Walker-platform    Walk 10 feet on uneven surface  activity   Assist      Assist level: Minimal Assistance - Patient > 75% Assistive device: Walker-platform   Wheelchair     Assist Is the patient using a wheelchair?: Yes Type of Wheelchair: Manual    Wheelchair assist level: Supervision/Verbal cueing Max wheelchair distance: 200+    Wheelchair 50 feet with 2 turns activity    Assist        Assist Level: Supervision/Verbal cueing   Wheelchair 150 feet activity     Assist      Assist Level: Supervision/Verbal cueing   Blood pressure 109/81, pulse 84, temperature 98.3 F (36.8 C), resp. rate 16, height 5\' 9"  (1.753 m), weight 90.7 kg, SpO2 95%.  Medical Problem List and Plan: 1. 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  Antithrombotics: -DVT/anticoagulation:  Mechanical: Sequential compression devices, below knee Bilateral lower extremities 3/11- will start Eliquis- 2.5 mg BID since doesn't want lovenox injections             -antiplatelet therapy: N/A--Plavix to be held for a week per Dr. Jordan Likes --Resume on 03/04  3. Pain Management:  On IV hydromorphone and Oxycodone prn.              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   3/4- has Percocet ordered BID as well as q4 hours prn- con't regimen and monitor- hasn't had this AM, so asked him to call for meds- last got 6 hours prior   3/5- changed percocet to 1-2 tabs q4 hours prn  3/7- pain doing ok- con't regimen  3/8- pain controlled- con't regimen  3/9- pain worse this Am- will add lidoderm patches- 3of them at night per pt request  3/13- changed pain meds back to 5-10 mg oxy q4 hours prn for pain and ocn't Percocet 7.5 mg BID- since having more shoulder/rib pain this AM  - although in L chest appears to be reproducible- no Sweating, SOB/DOE on it nor L jaw or L arm pain- just pain associated with rib fx's and L shoulder  pain from dislocation- could be from Platform walker- will ask therapy to wai ton platform walker today  3/14- pain has moved more to L lateral chest and anterior shoulder- which is c/w MSK pain- doing better with increased pain meds 4. Mood/Behavior/Sleep: LCSW to follow for evaluation and support.              -antipsychotic agents: N/A  5. Neuropsych/cognition: This patient is capable of making decisions on his own behalf. 6. Skin/Wound Care: Routine pressure measures 7. Fluids/Electrolytes/Nutrition: Monitor I/O. Check CMET in am 8.  Left shoulder dislocation: NWB with sling X 2 weeks. Follow up with ortho in 2 weeks.  Dr Ave Filter did L RTC repair several years ago 9.  Nasal Fracture: Continue Keflex thru 03/05-->follow up with ENT in a week 10.  Pneumonia- Left chest wall pain/SOB: Wean oxygen. Encourage pulmonary hygiene  3/4- wasn't wearing this AM  3/8- off O2 11. Chronic HFrEF: Compensated. Daily weight and strict I/O             --was on Jardiance, Spironolactone, Entresto- held due to renal function  3/4- ordered daily weights-will monitor for trend  3/7- weight down ~ 0.8 kg- off jardiance now will monitor  3/8- weight stable- con't to monitor  3/9- weight up 1 kg- will monitor trend  3/11- Weight up 6.5 lbs in 1 day- will Restart Entresto, but not sure if weight his real weight- will recheck in AM  3/12- weight back down to 89 kg- con't regimen  3/13- Weight 90 kg today- will monitor trend- concerned about giving Lasix because Cr is already at 1.45 up from baseline of 1.1 BNP only mildly elevated, CXR without pleural effusion , no peripheral edema  12. Anxiety d/o: Continue Klonopin prn 13. Insomnia: On Restoril 30 mg/hs 14. Wheezing: Hx smoking plus not taking deep breaths due to left rib painFlutter valve qid for pulmonary hygiene. Will schedule  duo nebs qid for now. 3/12- Con't nebs prn- since was switched from scheduled to prn by respiratory- asked pt to ask for and this  AM, asked nursing to give.  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 Thrush  3/4- will give Diflucan 200 mg x1 and then 100 mg daily x 6 days- might need to increase dose if doesn't improve.   3/5- will add Magic mouthwash with lidocaine- QID- for 7 days  3/7- Increased Diflucan to 200mg  yesterday- 15% better now- con't regimen  No oral lesions, overall better  17. Constipation  3/4- will give Sorbitol 30 mg x1 to get him to have BM after therapy- has been 3 days.   3/5- had good BM- con't regimen  3/7- will give Sorbitol since it's been 3 days on Senna 2 tabs BID and Miralax- con't regimen otherwise  3/8- LBM 2 days ago- if no results by tomorrow, will give Sorbitol  3/9- BM x 2  3/12- LBM 3 days ago- per chart- pt wants sorbitol- will give after therapy today  3/13- had good BM yesterday  3/14- LBM 2 days ago 18. AKI  3/4- will recheck labs Thursday since with HF, [place]'t want him to get too wet.   3/5- asked pt to drink a little more than currently drinking  3/6- Pt's BUN up to 33 and Cr up to 1.50- think it's because hurts to drink/eat, so will give some IVFs to tune back up- usually Cr <1 and is now 1.5- will give 75cc/hour  starting after therapy and do for 15 hours- won't do more, since has HF.       Latest Ref Rng & Units 01/06/2024    5:22 AM 01/05/2024   12:30 PM 01/04/2024    6:13 AM  BMP  Glucose 70 - 99 mg/dL 119  147  829   BUN 6 - 20 mg/dL 35  34  29   Creatinine 0.61 - 1.24 mg/dL 5.62  1.30  8.65   Sodium 135 - 145 mmol/L 131  129  132   Potassium  3.5 - 5.1 mmol/L 4.7  5.2  5.1   Chloride 98 - 111 mmol/L 103  97  104   CO2 22 - 32 mmol/L 16  22  23    Calcium 8.9 - 10.3 mg/dL 9.1  9.3  9.1    Still above baseline of 1.1 but appears to be mainly hydration related , cont to push po fluids  3/11- will restart Entresto, but not Jardiance- still not drinking as much as should  3/13- BUN 29 and Cr 1.45- staying stable, not sure if this is new baseline, or not? 3/15 won't resume diuretics unles there is sign of fluid retention  19. Wet cough  3/6- Will increase Tessalon to 200 mg TID scheduled  -independently reviewed CXR- Harrol't see pneumonia- however will wait for final read  3/11- added Robitussin- and Albuterol nebs- has Duonebs prn- but has to ask for either of them  3/13- pt reports when uses nebs, coughs more for a few minutes, but taking Codeine cough meds- help ssome as well  20. Orthostatic hypotension vs dizziness from double vision  3/8- will try TEDs and abd binder and ACE wraps- if doesn't improve, will add Midodrine.   3/9- patch for 1 eye helping as well as TEDs/ACE wraps- not tried abd binder yet  3/13- per therapy, less dizziness- since walked 350 ft yesterday- will monitor Vitals:   01/06/24 0517 01/06/24 0803  BP: 109/81   Pulse: 84   Resp: 16   Temp: 98.3 F (36.8 C)   SpO2: 96% 95%   Baseline BPs ok , no orthostatic changes per PT note on 3/9,   3/11- No more dizziness with TEDs.   Off  Aladactone because K+ is rising- esp in setting of elevated Cr- as well- monitor his labs as well as Cr/K+ and weight gain this weekend.   LOS: 12 days A FACE TO FACE EVALUATION WAS PERFORMED  Erick Colace 01/06/2024, 9:54 AM

## 2024-01-06 NOTE — Progress Notes (Signed)
+/-   sleep. At 2125, 10mg 's oxy ir given, along with restoril. Mostly complaining of pain to left rib and flank area. Only wanted 2 lidoderm patches placed. Continues with congested,  NP cough. At HS, inspiratory and expiratory wheeze noted. Reports using I.S. during day. Declined nizoral cream to face, "it's better." Also declined nystatin powder to groin. Removed teds at bedtime, refused SCD's. Using urinal. LBM 03/12, scheduled senna s given, declined supp. Or enema. Alfredo Martinez A

## 2024-01-06 NOTE — Progress Notes (Signed)
 Occupational Therapy Session Note  Patient Details  Name: Dustin Sampson MRN: [id number] Date of Birth: 19-Jan-1966  Today's Date: 01/06/2024 OT Individual Time: 1478-2956 OT Individual Time Calculation (min): 83 min    Short Term Goals: Week 1:  OT Short Term Goal 1 (Week 1): Pt will complete static standing activity with Min A with LRAD and no LOB OT Short Term Goal 1 - Progress (Week 1): Met OT Short Term Goal 2 (Week 1): Pt will complete LB dressing with Mod A with AE as necessary OT Short Term Goal 2 - Progress (Week 1): Met OT Short Term Goal 3 (Week 1): Pt will complete toileting at Mod A with LRAD OT Short Term Goal 3 - Progress (Week 1): Met  Skilled Therapeutic Interventions/Progress Updates: Patient focus was sit to stand and dynamic movements EOB to address ataxic movements & motor control as well as ease transfers.  Patient very ataxic when he initiated stand pivot transfer and elected to complete left lateral side scoots bed to w/c with moderate assistance and extra time to attempt to control ataxic movements.    Paitent requested to go to gym and work on kinetronic to "get my legs more stable so I can actually do the shower transfers and other things to take care of myself."    Additionally he completed dynamic movements supported and unsupported EOB and in w/c.     Patient will benefit from continued OT services including core strength & stabilization, UE accuracy with motor control, and other treatment techniques to increase safety and address safe independence with ADLs, IADLs and functional transfers and movements.  At end of session, patient was handed off in his w/c to nursing who came in to take his vital signs.  Continue OT POC     Therapy Documentation Precautions:  Precautions Precautions: Fall Shoulder Interventions: Shoulder sling/immobilizer Precaution/Restrictions Comments: ataxia Required Braces or Orthoses: Sling (Lt shoulder) Restrictions Weight Bearing  Restrictions Per Provider Order: Yes LUE Weight Bearing Per Provider Order: Non weight bearing Other Position/Activity Restrictions: sling for 2 weeks until ortho follow-up  Pain: denied    Therapy/Group: Individual Therapy  Bud Face [facility] 01/06/2024, 4:00 PM

## 2024-01-06 NOTE — Consult Note (Signed)
 Neuropsychological Consultation Comprehensive Inpatient Rehab   Patient:   Dustin Sampson   DOB:   23-May-1966  MR Number:  [id number]  Location:  [facility] [address] CENTER A [address] [place] [address] Dept: [phone] Loc: [phone]           Date of Service:   01/05/2024  Start Time:   9 AM End Time:   10 AM  Provider/Observer:  Arley Phenix, Psy.D.       Clinical Neuropsychologist       Billing Code/Service: [phone]  Reason for Service:    Dustin Sampson is a 58 year old male referred for neuropsychological consultation during his ongoing admission to the comprehensive rehabilitation.  Patient has been dealing with significant insomnia and anxiety during his admission.  Patient has a longstanding movement disorder and was seen by Dr. Everlena Cooper through [facility] neurology and then referred to [facility] movement disorders clinic.  Causative factors are still being worked on.  Patient notes that he had had ataxia even before his recent stroke.  Patient had an earlier stroke where he cracked several ribs and then this most recent fall with significant shoulder injury.  Patient's past medical history includes history of CVA with ataxia, HFR EF, insomnia, anxiety disorder, peripheral artery disease and frequent falls.  Patient was admitted on 12/19/2023 after tripping and falling with subsequent complaints of pain on his nose and right shoulder pain.  Patient noted that he was not using a walker when he fell and did have shoulder deformity at admission.  We on working on his dislocated shoulder workup revealed small area of subdural hematoma and minimally displaced nasal fracture.  Neurology recommended repeat CT for monitoring and if remains stable would be okay for discharge.  Patient remain nonweightbearing for his shoulder dislocation and follow-up with orthopedics.  Patient developed shortness of breath requiring oxygen on 2/27 with reports  of left chest pain and cough.  Patient also had tachycardia.  Workup did not identify any particular causative factor and tachycardia was felt to be pain related.  CIR was recommended due to functional decline.  During today's clinical visit the patient notes that he has been having increasing ataxia for some time.  Patient notes that his father had very similar symptoms developed without ever specific diagnostic explanations.  Patient reports that neurology has not felt that he has Parkinson's disease.  He is being followed by [facility] movement disorder clinic.  Patient has been improving as far as expressive language capacity with residual dysarthria noted.  Patient describes motivation to continue to work on therapeutic interventions.  Patient describes ongoing significant pain that he relates to his rib fractures in his shoulder.  Patient is weightbearing for his shoulder and following restrictions.  HPI for the current admission:    HPI:  Dustin Sampson is a 58 year old  male with history of CVA with ataxia, HFrEF, insomnia, anxiety d/o, PAD, frequent falls who was admitted on 12/19/23 after tripping and falling onto and complaints of nose and right shoulder pain with epistaxis. He was not using walker when he fell and noted to have shoulder deformity at admission. Work up revealed small area of SDH along anterior falx and minimally displaced nasal Fx on left. Dr.Pool recommended repeat CT for monitoring and if stable OK for d/c as well as holding Plavix for 7 days. Nasal laceration repaired and patient started on Keflex per discussion with Dr. Jearld Fenton who recommends follow up in  one week. Left shoulder dislocation reduced and to be NWB with ortho follow up in 2 weeks.    He developed SOB requiring oxygen on 02/27 with reports of left chest pain as well as cough. Has also had tachycardia felt to be pain mediated. CXR was negative. PT/OT consulted and patient noted to require min to mod assist due to wide  based staggering gait as well as anxiety. Marland Kitchen CIR recommended due to functional decline.    Medical History:   Past Medical History:  Diagnosis Date   Anxiety    Arthritis    Diabetes mellitus without complication (HCC)    Gout    Hyperlipemia    Hypertension    Hypothyroidism    Insomnia    Pneumonia    Stroke [facility])    Thyroid disease          Patient Active Problem List   Diagnosis Date Noted   Expressive aphasia 01/06/2024   Subdural hematoma (HCC) 12/25/2023   SDH (subdural hematoma) (HCC) 12/21/2023   Frequent falls 12/19/2023   Ataxia 12/19/2023   Chronic HFrEF (heart failure with reduced ejection fraction) (HCC) 11/16/2023   CHF exacerbation (HCC) 11/14/2023   Hyperlipidemia 11/14/2023   History of CVA (cerebrovascular accident) 11/14/2023   History of frequent PVCs 11/14/2023   MGUS (monoclonal gammopathy of unknown significance) 11/14/2023   Acute kidney injury superimposed on chronic kidney disease (HCC) 11/14/2023   Gout 11/14/2023   GAD (generalized anxiety disorder) 11/14/2023   PVD (peripheral vascular disease) (HCC) 05/03/2021   Presumed CAD 05/03/2021   Ischemic cardiomyopathy 05/03/2021   Non-insulin dependent type 2 diabetes mellitus (HCC) 03/26/2020   Essential hypertension 03/26/2020   Hypothyroidism 03/26/2020   Chronic pain of left ankle 07/11/2019   Strain of left Achilles tendon 07/11/2019   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 his dress was Appropriate and he was Well Groomed and his manners were Appropriate to the situation.  his participation was indicative of Attentive behaviors.  There were physical disabilities noted.  he displayed an appropriate level of cooperation and  motivation.    Interactions:    Active Appropriate  Attention:   within normal limits and attention span and concentration were age appropriate  Memory:   within normal limits; recent and remote memory intact  Visuo-spatial:   not examined  Speech (Volume):  normal  Speech:   normal; slurred  Thought Process:  Coherent and Relevant  Coherent, Linear, and Logical  Though Content:  WNL; not suicidal and not homicidal  Orientation:   person, place, time/date, and situation  Judgment:   Good  Planning:   Good  Affect:    Appropriate  Mood:    Dysphoric  Insight:   Good  Intelligence:   high  Psychiatric History:  No prior psychiatric history noted   Family Med/Psych History:  Family History  Problem Relation Age of Onset   Colon cancer Father    Lung cancer Father    Atrial fibrillation Mother    Autoimmune disease Sister    Healthy Daughter    Healthy Son     Impression/DX:   Dustin Sampson is a 58 year old male referred for neuropsychological consultation during his ongoing admission to the comprehensive rehabilitation.  Patient has  been dealing with significant insomnia and anxiety during his admission.  Patient has a longstanding movement disorder and was seen by Dr. Everlena Cooper through [facility] neurology and then referred to [facility] disorders clinic.  Causative factors are still being worked on.  Patient notes that he had had ataxia even before his recent stroke.  Patient had an earlier stroke where he cracked several ribs and then this most recent fall with significant shoulder injury.  Patient's past medical history includes history of CVA with ataxia, HFR EF, insomnia, anxiety disorder, peripheral artery disease and frequent falls.  Patient was admitted on 12/19/2023 after tripping and falling with subsequent complaints of pain on his nose and right shoulder pain.  Patient noted that he was not using a walker when he fell and did have shoulder deformity at admission.   We on working on his dislocated shoulder workup revealed small area of subdural hematoma and minimally displaced nasal fracture.  Neurology recommended repeat CT for monitoring and if remains stable would be okay for discharge.  Patient remain nonweightbearing for his shoulder dislocation and follow-up with orthopedics.  Patient developed shortness of breath requiring oxygen on 2/27 with reports of left chest pain and cough.  Patient also had tachycardia.  Workup did not identify any particular causative factor and tachycardia was felt to be pain related.  CIR was recommended due to functional decline.  During today's clinical visit the patient notes that he has been having increasing ataxia for some time.  Patient notes that his father had very similar symptoms developed without ever specific diagnostic explanations.  Patient reports that neurology has not felt that he has Parkinson's disease.  He is being followed by [facility] movement disorder clinic.  Patient has been improving as far as expressive language capacity with residual dysarthria noted.  Patient describes motivation to continue to work on therapeutic interventions.  Patient describes ongoing significant pain that he relates to his rib fractures in his shoulder.  Patient is weightbearing for his shoulder and following restrictions.  Disposition/Plan:  Today we worked on coping and adjustment issues and addressed many questions that he had about his ongoing expectations regarding his movement disorder.  [facility] and have been seen by Dr. Everlena Cooper previously.  I am not sure if attempts were made to have him see Dr. Arbutus Leas.          Electronically Signed   _______________________ Arley Phenix, Psy.D. Clinical Neuropsychologist

## 2024-01-06 NOTE — Plan of Care (Signed)
  Problem: Consults Goal: RH GENERAL PATIENT EDUCATION Description: See Patient Education module for education specifics. Outcome: Progressing   Problem: RH BOWEL ELIMINATION Goal: RH STG MANAGE BOWEL WITH ASSISTANCE Description: STG Manage Bowel with  supervision Assistance. Outcome: Progressing Goal: RH STG MANAGE BOWEL W/MEDICATION W/ASSISTANCE Description: STG Manage Bowel with Medication with  supervision Assistance. Outcome: Progressing   Problem: RH BLADDER ELIMINATION Goal: RH STG MANAGE BLADDER WITH ASSISTANCE Description: STG Manage Bladder With supervision  Assistance Outcome: Progressing   Problem: RH SKIN INTEGRITY Goal: RH STG SKIN FREE OF INFECTION/BREAKDOWN Description:  Manage skin free of infection/ breakdown  integrity with supervision   Outcome: Progressing   Problem: RH SAFETY Goal: RH STG ADHERE TO SAFETY PRECAUTIONS W/ASSISTANCE/DEVICE Description: STG Adhere to Safety Precautions With supervision Assistance/Device. Outcome: Progressing   Problem: RH PAIN MANAGEMENT Goal: RH STG PAIN MANAGED AT OR BELOW PT'S PAIN GOAL Description: <4 w/ prns Outcome: Progressing   Problem: RH KNOWLEDGE DEFICIT GENERAL Goal: RH STG INCREASE KNOWLEDGE OF SELF CARE AFTER HOSPITALIZATION Description: Manage increase knowledge of self care after hospitalization with supervision using educational materials prrovided Outcome: Progressing

## 2024-01-07 LAB — GLUCOSE, CAPILLARY
Glucose-Capillary: 139 mg/dL — ABNORMAL HIGH (ref 70–99)
Glucose-Capillary: 214 mg/dL — ABNORMAL HIGH (ref 70–99)
Glucose-Capillary: 115 mg/dL — ABNORMAL HIGH (ref 70–99)
Glucose-Capillary: 148 mg/dL — ABNORMAL HIGH (ref 70–99)

## 2024-01-07 NOTE — Progress Notes (Signed)
 Awake until 0300, without specific complaint. Restoril and oxy ir given at 2230 per patient's request. Oxy ir given at 0230. Complains of left rib cage pain. Lidoderm patches applied per orders. Lg.BM on previous shift. Dustin Sampson

## 2024-01-07 NOTE — Progress Notes (Signed)
 Physical Therapy Session Note  Patient Details  Name: Dustin Sampson MRN: [id number] Date of Birth: 1966-04-07  Today's Date: 01/07/2024 PT Individual Time: 1300-1345 PT Individual Time Calculation (min): 45 min   Short Term Goals: Week 2:  PT Short Term Goal 1 (Week 2): STG=LTG d/t ELOS  Skilled Therapeutic Interventions/Progress Updates:      Therapy Documentation Precautions:  Precautions Precautions: Fall Shoulder Interventions: Shoulder sling/immobilizer Precaution/Restrictions Comments: ataxia Required Braces or Orthoses: Sling (Lt shoulder) Restrictions Weight Bearing Restrictions Per Provider Order: Yes LUE Weight Bearing Per Provider Order: Non weight bearing Other Position/Activity Restrictions: sling for 2 weeks until ortho follow-up  Pt and spouse present for family training. Pt spouse ordered portable ramp and bed rail. PT engaged in discussion with patient and spouse regarding fluctuations in mobility due to ataxia and problem solved on solutions to improve safety and increase independence. Discussed possibility of custom w/c as pt with significant fall history with 50 falls and 100 near falls in past 6 months. Pt not functional Ambulator due to fall risk and ataxia therefore PT will look into custom wheelchair. Pt and spouse agreeable to ideal. Pt requiring min A for sit to stand and mod A with stand to sit in session. Min A gait x 10 ft as pt unable to ambulate greater distance. Discussed support groups and use of mental health supports as well as pt reports it has been difficult to have his current diagnosis. Pt left seated at bedside with all needs in reach and spouse present.     Therapy/Group: Individual Therapy  Truitt Leep Truitt Leep PT, DPT  01/07/2024, 3:41 PM

## 2024-01-07 NOTE — Plan of Care (Signed)
  Problem: Consults Goal: RH GENERAL PATIENT EDUCATION Description: See Patient Education module for education specifics. Outcome: Progressing   Problem: RH BOWEL ELIMINATION Goal: RH STG MANAGE BOWEL WITH ASSISTANCE Description: STG Manage Bowel with  supervision Assistance. Outcome: Progressing Goal: RH STG MANAGE BOWEL W/MEDICATION W/ASSISTANCE Description: STG Manage Bowel with Medication with  supervision Assistance. Outcome: Progressing   Problem: RH BLADDER ELIMINATION Goal: RH STG MANAGE BLADDER WITH ASSISTANCE Description: STG Manage Bladder With supervision  Assistance Outcome: Progressing   Problem: RH SKIN INTEGRITY Goal: RH STG SKIN FREE OF INFECTION/BREAKDOWN Description:  Manage skin free of infection/ breakdown  integrity with supervision   Outcome: Progressing   Problem: RH SAFETY Goal: RH STG ADHERE TO SAFETY PRECAUTIONS W/ASSISTANCE/DEVICE Description: STG Adhere to Safety Precautions With supervision Assistance/Device. Outcome: Progressing   Problem: RH PAIN MANAGEMENT Goal: RH STG PAIN MANAGED AT OR BELOW PT'S PAIN GOAL Description: <4 w/ prns Outcome: Progressing   Problem: RH KNOWLEDGE DEFICIT GENERAL Goal: RH STG INCREASE KNOWLEDGE OF SELF CARE AFTER HOSPITALIZATION Description: Manage increase knowledge of self care after hospitalization with supervision using educational materials prrovided Outcome: Progressing

## 2024-01-07 NOTE — Progress Notes (Signed)
 PROGRESS NOTE   Subjective/Complaints:  No issues last noc , Pt coughs with less rib pain   ROS: Pt denies SOB, abd pain, CP, N/V/C/D, and vision changes   (+) Cough- more wheezing Left sided rib pain- and anterior shoulder pain   Objective:   DG Chest 2 View Result Date: 01/05/2024 CLINICAL DATA:  Wheezing. EXAM: CHEST - 2 VIEW COMPARISON:  12/28/2023, 12/25/2023 FINDINGS: Borderline hyperinflation. Resolved bilateral lung opacities from prior exam. No new airspace disease. No pleural effusion or pneumothorax. No pulmonary edema. Remote left rib fracture. IMPRESSION: Borderline hyperinflation. Resolved bilateral lung opacities from prior exam. No acute airspace disease. Electronically Signed   By: Narda Rutherford M.D.   On: 01/05/2024 14:39      No results for input(s): "WBC", "HGB", "HCT", "PLT" in the last 72 hours.   Recent Labs    01/05/24 1230 01/06/24 0522  NA 129* 131*  K 5.2* 4.7  CL 97* 103  CO2 22 16*  GLUCOSE 143* 135*  BUN 34* 35*  CREATININE 1.60* 1.57*  CALCIUM 9.3 9.1    Intake/Output Summary (Last 24 hours) at 01/07/2024 0908 Last data filed at 01/07/2024 0600 Gross per 24 hour  Intake 417 ml  Output 800 ml  Net -383 ml        Physical Exam: Vital Signs Blood pressure 95/83, pulse 88, temperature 97.9 F (36.6 C), resp. rate 16, height 5\' 9"  (1.753 m), weight 92.3 kg, SpO2 96%.   General: No acute distress Mood and affect are appropriate Heart: Regular rate and rhythm no rubs murmurs or extra sounds Lungs: Clear to auscultation, breathing unlabored, no rales or wheezes, rhonchi clear with cough  Abdomen: Positive bowel sounds, soft nontender to palpation, nondistended Extremities: No clubbing, cyanosis, or edema Skin: No evidence of breakdown, no evidence of rash  MSK- TTP over anterior shoulder and L lateral chest today significant TTP over L anterior shoulder and L sided ribs-  reproducible pain Skin: No evidence of breakdown, no evidence of rash MSK- bilateral shoulders symmetric , no subluxation no pain to palpation  Less dysarthric this AM-  Neurologic: Cranial nerves II through XII intact, motor strength is 5/5 in RIght  deltoid, bicep, tricep, grip,5/5 right and 4/5 left  hip flexor, knee extensors, ankle dorsiflexor and plantar flexor Left grip 4/5   Assessment/Plan: 1. Functional deficits which require 3+ hours per day of interdisciplinary therapy in a comprehensive inpatient rehab setting. Physiatrist is providing close team supervision and 24 hour management of active medical problems listed below. Physiatrist and rehab team continue to assess barriers to discharge/monitor patient progress toward functional and medical goals  Care Tool:  Bathing              Bathing assist Assist Level: Maximal Assistance - Patient 24 - 49%     Upper Body Dressing/Undressing Upper body dressing   What is the patient wearing?: Pull over shirt    Upper body assist Assist Level: Maximal Assistance - Patient 25 - 49%    Lower Body Dressing/Undressing Lower body dressing            Lower body assist Assist for lower body dressing: Maximal Assistance -  Patient 25 - 49%     Toileting Toileting    Toileting assist Assist for toileting: Maximal Assistance - Patient 25 - 49%     Transfers Chair/bed transfer  Transfers assist     Chair/bed transfer assist level: Contact Guard/Touching assist     Locomotion Ambulation   Ambulation assist      Assist level: Contact Guard/Touching assist (close stand-by) Assistive device: Walker-platform Max distance: 350   Walk 10 feet activity   Assist     Assist level: Contact Guard/Touching assist Assistive device: Walker-platform   Walk 50 feet activity   Assist Walk 50 feet with 2 turns activity did not occur: Safety/medical concerns  Assist level: Contact Guard/Touching assist Assistive  device: Walker-platform    Walk 150 feet activity   Assist Walk 150 feet activity did not occur: Safety/medical concerns  Assist level: Contact Guard/Touching assist Assistive device: Walker-platform    Walk 10 feet on uneven surface  activity   Assist     Assist level: Minimal Assistance - Patient > 75% Assistive device: Walker-platform   Wheelchair     Assist Is the patient using a wheelchair?: Yes Type of Wheelchair: Manual    Wheelchair assist level: Supervision/Verbal cueing Max wheelchair distance: 200+    Wheelchair 50 feet with 2 turns activity    Assist        Assist Level: Supervision/Verbal cueing   Wheelchair 150 feet activity     Assist      Assist Level: Supervision/Verbal cueing   Blood pressure 95/83, pulse 88, temperature 97.9 F (36.6 C), resp. rate 16, height 5\' 9"  (1.753 m), weight 92.3 kg, SpO2 96%.  Medical Problem List and Plan: 1. 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  Antithrombotics: -DVT/anticoagulation:  Mechanical: Sequential compression devices, below knee Bilateral lower extremities 3/11- will start Eliquis- 2.5 mg BID since doesn't want lovenox injections             -antiplatelet therapy: N/A--Plavix to be held for a week per Dr. Jordan Likes --Resume on 03/04  3. Pain Management:  On IV hydromorphone and Oxycodone prn.              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   3/4- has Percocet ordered BID as well as q4 hours prn- con't regimen and monitor- hasn't had this AM, so asked him to call for meds- last got 6 hours prior   3/5- changed percocet to 1-2 tabs q4 hours prn  3/7- pain doing ok- con't regimen  3/8- pain controlled- con't regimen  3/9- pain worse this Am- will add lidoderm patches- 3of them at night per pt request  3/13- changed pain meds  back to 5-10 mg oxy q4 hours prn for pain and ocn't Percocet 7.5 mg BID- since having more shoulder/rib pain this AM  - although in L chest appears to be reproducible- no Sweating, SOB/DOE on it nor L jaw or L arm pain- just pain associated with rib fx's and L shoulder pain from dislocation- could be from Platform walker- will ask therapy to wai ton platform walker today  3/14- pain has moved more to L lateral chest and anterior shoulder- which is c/w MSK pain- doing better with  increased pain meds 4. Mood/Behavior/Sleep: LCSW to follow for evaluation and support.              -antipsychotic agents: N/A   5. Neuropsych/cognition: This patient is capable of making decisions on his own behalf. 6. Skin/Wound Care: Routine pressure measures 7. Fluids/Electrolytes/Nutrition: Monitor I/O. Check CMET in am 8.  Left shoulder dislocation: NWB with sling X 2 weeks. Follow up with ortho in 2 weeks.  Dr Ave Filter did L RTC repair several years ago 9.  Nasal Fracture: Continue Keflex thru 03/05-->follow up with ENT in a week 10.  Pneumonia- Left chest wall pain/SOB: Wean oxygen. Encourage pulmonary hygiene  3/4- wasn't wearing this AM  3/8- off O2 11. Chronic HFrEF: Compensated. Daily weight and strict I/O             --was on Jardiance, Spironolactone, Entresto- held due to renal function  3/4- ordered daily weights-will monitor for trend  3/7- weight down ~ 0.8 kg- off jardiance now will monitor  3/8- weight stable- con't to monitor  3/9- weight up 1 kg- will monitor trend  3/11- Weight up 6.5 lbs in 1 day- will Restart Entresto, but not sure if weight his real weight- will recheck in AM  3/12- weight back down to 89 kg- con't regimen  3/13- Weight 90 kg today- will monitor trend- concerned about giving Lasix because Cr is already at 1.45 up from baseline of 1.1 BNP only mildly elevated, CXR without pleural effusion , no peripheral edema  12. Anxiety d/o: Continue Klonopin prn 13. Insomnia: On Restoril  30 mg/hs 14. Wheezing: Hx smoking plus not taking deep breaths due to left rib painFlutter valve qid for pulmonary hygiene. Will schedule  duo nebs qid for now. 3/12- Con't nebs prn- since was switched from scheduled to prn by respiratory- asked pt to ask for and this AM, asked nursing to give.  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 Thrush  3/4- will give Diflucan 200 mg x1 and then 100 mg daily x 6 days- might need to increase dose if doesn't improve.   3/5- will add Magic mouthwash with lidocaine- QID- for 7 days  3/7- Increased Diflucan to 200mg  yesterday- 15% better now- con't regimen  No oral lesions, overall better  17. Constipation  3/4- will give Sorbitol 30 mg x1 to get him to have BM after therapy- has been 3 days.   3/5- had good BM- con't regimen  3/7- will give Sorbitol since it's been 3 days on Senna 2 tabs BID and Miralax- con't regimen otherwise  3/8- LBM 2 days ago- if no results by tomorrow, will give Sorbitol  3/9- BM x 2  3/12- LBM 3 days ago- per chart- pt wants sorbitol- will give after therapy today  3/16 last BM 3/15   18. AKI  3/4- will recheck labs Thursday since with HF, Leeum't want him to get too wet.   3/5- asked pt to drink a little more than currently drinking  3/6- Pt's BUN up to 33 and Cr up to 1.50-  think it's because hurts to drink/eat, so will give some IVFs to tune back up- usually Cr <1 and is now 1.5- will give 75cc/hour starting after therapy and do for 15 hours- won't do more, since has HF.       Latest Ref Rng & Units  01/06/2024    5:22 AM 01/05/2024   12:30 PM 01/04/2024    6:13 AM  BMP  Glucose 70 - 99 mg/dL 469  629  528   BUN 6 - 20 mg/dL 35  34  29   Creatinine 0.61 - 1.24 mg/dL 4.13  2.44  0.10   Sodium 135 - 145 mmol/L 131  129  132   Potassium 3.5 - 5.1 mmol/L 4.7  5.2  5.1   Chloride 98 - 111 mmol/L 103  97  104   CO2 22 - 32 mmol/L 16  22  23    Calcium 8.9 - 10.3 mg/dL 9.1  9.3  9.1    Still above baseline of 1.1 but appears to be mainly hydration related , cont to push po fluids  3/11- will restart Entresto, but not Jardiance- still not drinking as much as should  3/13- BUN 29 and Cr 1.45- staying stable, not sure if this is new baseline, or not? 3/15 won't resume diuretics unles there is sign of fluid retention  19. Wet cough  3/6- Will increase Tessalon to 200 mg TID scheduled  -independently reviewed CXR- Delvecchio't see pneumonia- however will wait for final read  3/11- added Robitussin- and Albuterol nebs- has Duonebs prn- but has to ask for either of them  3/13- pt reports when uses nebs, coughs more for a few minutes, but taking Codeine cough meds- help ssome as well  20. Orthostatic hypotension vs dizziness from double vision  3/8- will try TEDs and abd binder and ACE wraps- if doesn't improve, will add Midodrine.   3/9- patch for 1 eye helping as well as TEDs/ACE wraps- not tried abd binder yet  3/13- per therapy, less dizziness- since walked 350 ft yesterday- will monitor Vitals:   01/06/24 2051 01/07/24 0629  BP:  95/83  Pulse:  88  Resp:  16  Temp:  97.9 F (36.6 C)  SpO2: 95% 96%   Baseline BPs ok , no orthostatic changes per PT note on 3/9,   3/11- No more dizziness with TEDs.   Off  Aladactone because K+ is rising- esp in setting of elevated Cr- as well- monitor his labs as well as Cr/K+ and weight gain this weekend.  K+ 4.7 on 3/15 LOS: 13 days A FACE TO FACE EVALUATION WAS PERFORMED  Erick Colace 01/07/2024, 9:08 AM

## 2024-01-07 NOTE — Progress Notes (Signed)
 Occupational Therapy Session Note  Patient Details  Name: Dustin Sampson MRN: [id number] Date of Birth: 1966-09-15  Today's Date: 01/07/2024 OT Individual Time: 1345-1450 OT Individual Time Calculation (min): 65 min    Short Term Goals: Week 1:  OT Short Term Goal 1 (Week 1): Pt will complete static standing activity with Min A with LRAD and no LOB OT Short Term Goal 1 - Progress (Week 1): Met OT Short Term Goal 2 (Week 1): Pt will complete LB dressing with Mod A with AE as necessary OT Short Term Goal 2 - Progress (Week 1): Met OT Short Term Goal 3 (Week 1): Pt will complete toileting at Mod A with LRAD OT Short Term Goal 3 - Progress (Week 1): Met Week 2:  OT Short Term Goal 1 (Week 2): STG=LTG d/t ELOS  Skilled Therapeutic Interventions/Progress Updates:      Therapy Documentation Precautions:  Precautions Precautions: Fall Shoulder Interventions: Shoulder sling/immobilizer Precaution/Restrictions Comments: ataxia Required Braces or Orthoses: Sling (Lt shoulder) Restrictions Weight Bearing Restrictions Per Provider Order: Yes LUE Weight Bearing Per Provider Order: Non weight bearing Other Position/Activity Restrictions: sling for 2 weeks until ortho follow-up General: "This is my wife!" Pt seated in W/C upon OT arrival, agreeable to OT. Wife present for family ed  Pain:  increased although unrated pain reported in LUE, activity, intermittent rest breaks, distractions provided for pain management, pt reports tolerable to proceed. Pt reporting incident where has posterior LOB yesterday putting weight through LUE. MD notified d/t increased pain. OT examined, no external differences noted.  Other Treatments: Patient, pt wife, and [place] Dec OT present at family edu. Pt and care partner educated on functional level of assistance for ADLs and functional mobility/transfers. Pt educated specifically on CARE tool levels of assistance provided at current status and level of assistance for OT  goals once at D/C. OT educating pt and wife on AE such as installable bidet, portable ramp for into house and different custom W/C options for increased independence with safe mobility and ability to safely age in place. Pt and wife educated not to complete functional mobility out of chair unless supervised with assistance for safety and fall prevention. Wife also educated wearing schedule for LUE sling (for comfort) and table top ROM exercises and motions to avoid for dislocation of shoulders. OT educated wife on vacation resources for W/C Therapist, music.   Pt seated in W/C at end of session with W/C alarm donned, call light within reach and 4Ps assessed.    Therapy/Group: Individual Therapy  Velia Meyer, OTD, OTR/L 01/07/2024, 4:21 PM

## 2024-01-08 LAB — GLUCOSE, CAPILLARY
Glucose-Capillary: 144 mg/dL — ABNORMAL HIGH (ref 70–99)
Glucose-Capillary: 123 mg/dL — ABNORMAL HIGH (ref 70–99)
Glucose-Capillary: 97 mg/dL (ref 70–99)
Glucose-Capillary: 117 mg/dL — ABNORMAL HIGH (ref 70–99)

## 2024-01-08 LAB — BASIC METABOLIC PANEL
Anion gap: 4 — ABNORMAL LOW (ref 5–15)
BUN: 33 mg/dL — ABNORMAL HIGH (ref 6–20)
CO2: 24 mmol/L (ref 22–32)
Calcium: 8.7 mg/dL — ABNORMAL LOW (ref 8.9–10.3)
Chloride: 104 mmol/L (ref 98–111)
Creatinine, Ser: 1.31 mg/dL — ABNORMAL HIGH (ref 0.61–1.24)
GFR, Estimated: >60 mL/min (ref >60)
Glucose, Bld: 176 mg/dL — ABNORMAL HIGH (ref 70–99)
Potassium: 4.4 mmol/L (ref 3.5–5.1)
Sodium: 132 mmol/L — ABNORMAL LOW (ref 135–145)

## 2024-01-08 LAB — CBC
HCT: 33.7 % — ABNORMAL LOW (ref 39.0–52.0)
Hemoglobin: 11.1 g/dL — ABNORMAL LOW (ref 13.0–17.0)
MCH: 28.8 pg (ref 26.0–34.0)
MCHC: 32.9 g/dL (ref 30.0–36.0)
MCV: 87.3 fL (ref 80.0–100.0)
Platelets: 277 10*3/uL (ref 150–400)
RBC: 3.86 MIL/uL — ABNORMAL LOW (ref 4.22–5.81)
RDW: 13.6 % (ref 11.5–15.5)
WBC: 8.3 10*3/uL (ref 4.0–10.5)
nRBC: 0 % (ref 0.0–0.2)

## 2024-01-08 NOTE — Progress Notes (Signed)
 Occupational Therapy Session Note  Patient Details  Name: Dustin Sampson MRN: [id number] Date of Birth: 11/15/1965  Today's Date: 01/08/2024 OT Individual Time: 1300-1330 OT Individual Time Calculation (min): 30 min    Short Term Goals: Week 1:  OT Short Term Goal 1 (Week 1): Pt will complete static standing activity with Min A with LRAD and no LOB OT Short Term Goal 1 - Progress (Week 1): Met OT Short Term Goal 2 (Week 1): Pt will complete LB dressing with Mod A with AE as necessary OT Short Term Goal 2 - Progress (Week 1): Met OT Short Term Goal 3 (Week 1): Pt will complete toileting at Mod A with LRAD OT Short Term Goal 3 - Progress (Week 1): Met  Skilled Therapeutic Interventions/Progress Updates:    OT intervention with focus on discharge planning, safety recommendations, PWC mobility, and bed transfer in preparation for d/c home on 3/19. Reviewed home safety recommendations and placement of BSC beside bed for ease of transfers. PWC moblity in day room and hallway, including backing next to bed. Squat pivot transfer to EOB with CGA. Sit>supine with supervision. Pt remained in bed with all needs within reach. Bed alarm activated.   Therapy Documentation Precautions:  Precautions Precautions: Fall Shoulder Interventions: Shoulder sling/immobilizer Precaution/Restrictions Comments: ataxia Required Braces or Orthoses: Sling (Lt shoulder) Restrictions Weight Bearing Restrictions Per Provider Order: Yes LUE Weight Bearing Per Provider Order: Non weight bearing Other Position/Activity Restrictions: sling for 2 weeks until ortho follow-up   Pain: Pt reports 6/10 rib cage (Lt) pain; meds admin prior to therapy   Therapy/Group: Individual Therapy  Rich Brave 01/08/2024, 2:40 PM

## 2024-01-08 NOTE — Progress Notes (Signed)
 Physical Therapy Session Note  Patient Details  Name: Dustin Sampson MRN: [id number] Date of Birth: 03-07-1966  Today's Date: 01/08/2024 PT Individual Time: 1118-1200 PT Individual Time Calculation (min): 42 min   Short Term Goals: Week 2:  PT Short Term Goal 1 (Week 2): STG=LTG d/t ELOS  Skilled Therapeutic Interventions/Progress Updates:      Therapy Documentation Precautions:  Precautions Precautions: Fall Shoulder Interventions: Shoulder sling/immobilizer Precaution/Restrictions Comments: ataxia Required Braces or Orthoses: Sling (Lt shoulder) Restrictions Weight Bearing Restrictions Per Provider Order: Yes LUE Weight Bearing Per Provider Order: Non weight bearing Other Position/Activity Restrictions: sling for 2 weeks until ortho follow-up General:   Pt received supine in bed and agreeable to therapy, pt has no c/o pain at this time. Pt requests focusing on strength for the rest of his stay d/t how variable his ataxia is. Discussed trying PWC before specialty w/c evaluation tomorrow to determine appropriate features.   Performed CGA stand pivot transfer to [facility] with wide based quad cane. Discussed PWC mobility, functions, and safety. Pt verbalized understanding of safety features and teach back was used throughout the session to ensure understanding.   Pt drove PWC to hallway with supervision. Leg rests and head rests adjusted for pt. Pt drove PWC back to room with supervision, needed assist to back into room d/t difficulty navigating in tight spaces - discussed that flicking joystick rather than holding can be helpful in small space maneuvering. Pt left seated in PWC with all needs in reach chair off.     Therapy/Group: Individual Therapy  Collins Scotland 01/08/2024, 3:58 PM

## 2024-01-08 NOTE — Progress Notes (Signed)
 Occupational Therapy Session Note  Patient Details  Name: Dustin Sampson MRN: [id number] Date of Birth: 10-17-1966  Today's Date: 01/08/2024 OT Individual Time: 2956-2130 OT Individual Time Calculation (min): 57 min     Skilled Therapeutic Interventions/Progress Updates: Patient received resting in bed. Agreeable to OT treatment. Patient wanting to get OOB and work on some exercises to improve strength and fluid movements of the UE. Patient assisted into sling on the LUE. Set up assist to [name] after total assist donning TEDs. Assisted patient OOB with min assist to w/c and pushed to therapy gym. Used 4 lb hand weight 2 x 10. Worked on corresponding muscl groups without limitation, though he remarked he has injured the RUE in the past. Patient reports his ataxia can vary in severity with no clear reason. UE movements appeared smooth without any notable ataxia. Tolerated two sets well, but was fatiged and needing a rest following 10 repetitions. Patient motivated to continue UE strengthening routine at home and reports his wife enjoys working out and would encourage him. Continue with skilled OT POC working on safety and independence.      Therapy Documentation Precautions:  Precautions Precautions: Fall Shoulder Interventions: Shoulder sling/immobilizer Precaution/Restrictions Comments: ataxia Required Braces or Orthoses: Sling (Lt shoulder) Restrictions Weight Bearing Restrictions Per Provider Order: Yes LUE Weight Bearing Per Provider Order: Non weight bearing Other Position/Activity Restrictions: sling for 2 weeks until ortho follow-up    Vital Signs: Oxygen Therapy SpO2: 96 % O2 Device: Room Air Pain: Pain Assessment Pain Scale: 0-10 Pain Score: 4 mild  Pain Type: Acute pain Pain Location: Rib cage Pain Intervention(s): Medication (See eMAR)   Therapy/Group: Individual Therapy  Warnell Forester 01/08/2024, 12:12 PM

## 2024-01-08 NOTE — Progress Notes (Signed)
 Physical Therapy Session Note  Patient Details  Name: Dustin Sampson MRN: [id number] Date of Birth: 01/05/1966  Today's Date: 01/08/2024 PT Individual Time: 0805-0845 PT Individual Time Calculation (min): 40 min   Short Term Goals: Week 2:  PT Short Term Goal 1 (Week 2): STG=LTG d/t ELOS  Skilled Therapeutic Interventions/Progress Updates:      Therapy Documentation Precautions:  Precautions Precautions: Fall Shoulder Interventions: Shoulder sling/immobilizer Precaution/Restrictions Comments: ataxia Required Braces or Orthoses: Sling (Lt shoulder) Restrictions Weight Bearing Restrictions Per Provider Order: Yes LUE Weight Bearing Per Provider Order: Non weight bearing Other Position/Activity Restrictions: sling for 2 weeks until ortho follow-up  Pt agreeable to PT session with emphasis on discharge planning and DME selection. Pt presents with fluctuations in assist level due to ataxia and is a non-functional Ambulator and would benefit from power wheelchair. Discussed with MD, OT and pt and pt agreeable to learn about PWC options. PT demonstrated how to operate a PWC and explained potential features to patient. PT scheduled w/c evaluation and notified care team     Therapy/Group: Individual Therapy  Truitt Leep Truitt Leep PT, DPT  01/08/2024, 12:29 PM

## 2024-01-08 NOTE — Progress Notes (Signed)
+/-   sleep. PRN klonopin given at 2043.PRN Oxy ir given at 2044 & 0045, for complaint of left rib. Using urinal. Martina Sinner

## 2024-01-08 NOTE — Progress Notes (Signed)
 PROGRESS NOTE   Subjective/Complaints:  Pt reports still coughing on and off- has more coughing fits intermittently per RN last night.   Breathing "not great, not terrible".  Cough less overall yesterday.  About to get Duonebs.  Thinks prednisone helped breathing and cough, but spiked up his CBGs.   ROS:  Pt denies SOB, abd pain, CP, N/V/C/D, and vision changes   (+) Cough- more wheezing Left sided rib pain- and anterior shoulder pain   Objective:   No results found.     Recent Labs    01/08/24 0515  WBC 8.3  HGB 11.1*  HCT 33.7*  PLT 277     Recent Labs    01/06/24 0522 01/08/24 0515  NA 131* 132*  K 4.7 4.4  CL 103 104  CO2 16* 24  GLUCOSE 135* 176*  BUN 35* 33*  CREATININE 1.57* 1.31*  CALCIUM 9.1 8.7*    Intake/Output Summary (Last 24 hours) at 01/08/2024 0910 Last data filed at 01/08/2024 0300 Gross per 24 hour  Intake 480 ml  Output 1200 ml  Net -720 ml        Physical Exam: Vital Signs Blood pressure 112/83, pulse 90, temperature 98 F (36.7 C), temperature source Oral, resp. rate 16, height 5\' 9"  (1.753 m), weight 91.2 kg, SpO2 96%.    General: awake, alert, just woke up- a little out of it for a few minutes; NAD HENT: conjugate gaze; oropharynx moist CV: regular rate and rhythm; no JVD Pulmonary: rare cough; sounds coarse- but cleared more with 1 cough GI: soft, NT, ND, (+)BS Psychiatric: appropriate- sleepy Neurological: Ox3   MSK- TTP over anterior shoulder and L lateral chest today significant TTP over L anterior shoulder and L sided ribs- reproducible pain Skin: No evidence of breakdown, no evidence of rash MSK- bilateral shoulders symmetric , no subluxation no pain to palpation  Less dysarthric this AM-  Neurologic: Cranial nerves II through XII intact, motor strength is 5/5 in RIght  deltoid, bicep, tricep, grip,5/5 right and 4/5 left  hip flexor, knee extensors,  ankle dorsiflexor and plantar flexor Left grip 4/5   Assessment/Plan: 1. Functional deficits which require 3+ hours per day of interdisciplinary therapy in a comprehensive inpatient rehab setting. Physiatrist is providing close team supervision and 24 hour management of active medical problems listed below. Physiatrist and rehab team continue to assess barriers to discharge/monitor patient progress toward functional and medical goals  Care Tool:  Bathing              Bathing assist Assist Level: Maximal Assistance - Patient 24 - 49%     Upper Body Dressing/Undressing Upper body dressing   What is the patient wearing?: Pull over shirt    Upper body assist Assist Level: Maximal Assistance - Patient 25 - 49%    Lower Body Dressing/Undressing Lower body dressing            Lower body assist Assist for lower body dressing: Maximal Assistance - Patient 25 - 49%     Toileting Toileting    Toileting assist Assist for toileting: Maximal Assistance - Patient 25 - 49%     Transfers Chair/bed transfer  Transfers assist     Chair/bed transfer assist level: Contact Guard/Touching assist     Locomotion Ambulation   Ambulation assist      Assist level: Contact Guard/Touching assist (close stand-by) Assistive device: Walker-platform Max distance: 350   Walk 10 feet activity   Assist     Assist level: Contact Guard/Touching assist Assistive device: Walker-platform   Walk 50 feet activity   Assist Walk 50 feet with 2 turns activity did not occur: Safety/medical concerns  Assist level: Contact Guard/Touching assist Assistive device: Walker-platform    Walk 150 feet activity   Assist Walk 150 feet activity did not occur: Safety/medical concerns  Assist level: Contact Guard/Touching assist Assistive device: Walker-platform    Walk 10 feet on uneven surface  activity   Assist     Assist level: Minimal Assistance - Patient > 75% Assistive  device: Walker-platform   Wheelchair     Assist Is the patient using a wheelchair?: Yes Type of Wheelchair: Manual    Wheelchair assist level: Supervision/Verbal cueing Max wheelchair distance: 200+    Wheelchair 50 feet with 2 turns activity    Assist        Assist Level: Supervision/Verbal cueing   Wheelchair 150 feet activity     Assist      Assist Level: Supervision/Verbal cueing   Blood pressure 112/83, pulse 90, temperature 98 F (36.7 C), temperature source Oral, resp. rate 16, height 5\' 9"  (1.753 m), weight 91.2 kg, SpO2 96%.  Medical Problem List and Plan: 1. 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  Antithrombotics: -DVT/anticoagulation:  Mechanical: Sequential compression devices, below knee Bilateral lower extremities 3/11- will start Eliquis- 2.5 mg BID since doesn't want lovenox injections             -antiplatelet therapy: N/A--Plavix to be held for a week per Dr. Jordan Likes --Resume on 03/04  3. Pain Management:  On IV hydromorphone and Oxycodone prn.              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   3/4- has Percocet ordered BID as well as q4 hours prn- con't regimen and monitor- hasn't had this AM, so asked him to call for meds- last got 6 hours prior   3/5- changed percocet to 1-2 tabs q4 hours prn  3/7- pain doing ok- con't regimen  3/8- pain controlled- con't regimen  3/9- pain worse this Am- will add lidoderm patches- 3of them at night per pt request  3/13- changed pain meds back to 5-10 mg oxy q4 hours prn for pain and ocn't Percocet 7.5 mg BID- since having more shoulder/rib pain this AM  - although in L chest appears to be reproducible- no Sweating, SOB/DOE on it nor L jaw or L arm pain- just pain associated with rib fx's and L shoulder pain from dislocation- could be from  Platform walker- will ask therapy to wai ton platform walker today  3/14- pain has moved more to L lateral chest and anterior shoulder- which is c/w MSK pain- doing better with increased pain meds  3/17- pain doing better this AM 4. Mood/Behavior/Sleep: LCSW to follow for evaluation and support.              -  antipsychotic agents: N/A   5. Neuropsych/cognition: This patient is capable of making decisions on his own behalf. 6. Skin/Wound Care: Routine pressure measures 7. Fluids/Electrolytes/Nutrition: Monitor I/O. Check CMET in am 8.  Left shoulder dislocation: NWB with sling X 2 weeks. Follow up with ortho in 2 weeks.  Dr Ave Filter did L RTC repair several years ago 9.  Nasal Fracture: Continue Keflex thru 03/05-->follow up with ENT in a week 10.  Pneumonia- Left chest wall pain/SOB: Wean oxygen. Encourage pulmonary hygiene  3/4- wasn't wearing this AM  3/8- off O2 11. Chronic HFrEF: Compensated. Daily weight and strict I/O             --was on Jardiance, Spironolactone, Entresto- held due to renal function  3/4- ordered daily weights-will monitor for trend  3/7- weight down ~ 0.8 kg- off jardiance now will monitor  3/8- weight stable- con't to monitor  3/9- weight up 1 kg- will monitor trend  3/11- Weight up 6.5 lbs in 1 day- will Restart Entresto, but not sure if weight his real weight- will recheck in AM  3/12- weight back down to 89 kg- con't regimen  3/13- Weight 90 kg today- will monitor trend- concerned about giving Lasix because Cr is already at 1.45 up from baseline of 1.1 BNP only mildly elevated, CXR without pleural effusion , no peripheral edema   3/17- Weight 91.2 kg today- up dramatically, not sure is correct- will monitor for trend 12. Anxiety d/o: Continue Klonopin prn 13. Insomnia: On Restoril 30 mg/hs 14. Wheezing: Hx smoking plus not taking deep breaths due to left rib painFlutter valve qid for pulmonary hygiene. Will schedule  duo nebs qid for now. 3/12- Con't nebs  prn- since was switched from scheduled to prn by respiratory- asked pt to ask for and this AM, asked nursing to give.  3/13- added codeine cough meds for cough- nebs prn  3/14- coughing/wheezing more- will add Prednisone 20 mg daily x3 days and monitor- might need CXR, additional work up if doesn't improve- nebs stopped yesterday since cannot go home on it- but will change Combivent to QID scheduled CXR without evidence of PNA has emphysematous changes 3/17- getting Duonebs around the clock- is helping- steroids helped, some, but spiked his CBGs   15.  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 Thrush  3/4- will give Diflucan 200 mg x1 and then 100 mg daily x 6 days- might need to increase dose if doesn't improve.   3/5- will add Magic mouthwash with lidocaine- QID- for 7 days  3/7- Increased Diflucan to 200mg  yesterday- 15% better now- con't regimen  No oral lesions, overall better  17. Constipation  3/4- will give Sorbitol 30 mg x1 to get him to have BM after therapy- has been 3 days.   3/5- had good BM- con't regimen  3/7- will give Sorbitol since it's been 3 days on Senna 2 tabs BID and Miralax- con't regimen otherwise  3/8- LBM 2 days ago- if no results by tomorrow, will give Sorbitol  3/9- BM x 2  3/12- LBM 3 days ago- per chart- pt wants sorbitol- will give after therapy today  3/16 last BM 3/15  3/17- LBM yesterday 18. AKI  3/4- will recheck labs Thursday since with HF, Ibraheem't want him to get too wet.   3/5- asked pt to drink a little more than currently drinking  3/6- Pt's BUN  up to 33 and Cr up to 1.50- think it's because hurts to drink/eat, so will give some IVFs to tune back up- usually Cr <1 and is now 1.5- will give 75cc/hour starting after therapy and do for 15 hours- won't do more, since has HF.       Latest Ref Rng & Units 01/08/2024    5:15 AM 01/06/2024    5:22 AM 01/05/2024   12:30 PM   BMP  Glucose 70 - 99 mg/dL 130  865  784   BUN 6 - 20 mg/dL 33  35  34   Creatinine 0.61 - 1.24 mg/dL 6.96  2.95  2.84   Sodium 135 - 145 mmol/L 132  131  129   Potassium 3.5 - 5.1 mmol/L 4.4  4.7  5.2   Chloride 98 - 111 mmol/L 104  103  97   CO2 22 - 32 mmol/L 24  16  22    Calcium 8.9 - 10.3 mg/dL 8.7  9.1  9.3    Still above baseline of 1.1 but appears to be mainly hydration related , cont to push po fluids  3/11- will restart Entresto, but not Jardiance- still not drinking as much as should  3/13- BUN 29 and Cr 1.45- staying stable, not sure if this is new baseline, or not? 3/15 won't resume diuretics unles there is sign of fluid retention   3/17- Cr 1.33 looks much better- and BUN 33-  19. Wet cough  3/6- Will increase Tessalon to 200 mg TID scheduled  -independently reviewed CXR- Shray't see pneumonia- however will wait for final read  3/11- added Robitussin- and Albuterol nebs- has Duonebs prn- but has to ask for either of them  3/13- pt reports when uses nebs, coughs more for a few minutes, but taking Codeine cough meds- help ssome as well   3/17- coughing is getting better 20. Orthostatic hypotension vs dizziness from double vision  3/8- will try TEDs and abd binder and ACE wraps- if doesn't improve, will add Midodrine.   3/9- patch for 1 eye helping as well as TEDs/ACE wraps- not tried abd binder yet  3/13- per therapy, less dizziness- since walked 350 ft yesterday- will monitor Vitals:   01/08/24 0509 01/08/24 0735  BP: 112/83   Pulse: 90   Resp: 16   Temp: 98 F (36.7 C)   SpO2: 95% 96%   Baseline BPs ok , no orthostatic changes per PT note on 3/9,   3/11- No more dizziness with TEDs.   Off  Aladactone because K+ is rising- esp in setting of elevated Cr- as well- monitor his labs as well as Cr/K+ and weight gain this weekend.  K+ 4.7 on 3/15  3/17- K+ 4.4   I spent a total of 37   minutes on total care today- >50% coordination of care- due to  D/w pt, nursing and  resp therapy-  Also reviewed labs, vitals and output   LOS: 14 days A FACE TO FACE EVALUATION WAS PERFORMED  Charese Abundis 01/08/2024, 9:10 AM

## 2024-01-08 NOTE — Progress Notes (Signed)
 Occupational Therapy Session Note  Patient Details  Name: Dustin Sampson MRN: [id number] Date of Birth: May 13, 1966  Today's Date: 01/08/2024 OT Individual Time: 1445-1535 OT Individual Time Calculation (min): 50 min    Short Term Goals: Week 2:  OT Short Term Goal 1 (Week 2): STG=LTG d/t ELOS  Skilled Therapeutic Interventions/Progress Updates:      Therapy Documentation Precautions:  Precautions Precautions: Fall Shoulder Interventions: Shoulder sling/immobilizer Precaution/Restrictions Comments: ataxia Required Braces or Orthoses: Sling (Lt shoulder) Restrictions Weight Bearing Restrictions Per Provider Order: Yes LUE Weight Bearing Per Provider Order: Non weight bearing Other Position/Activity Restrictions: sling for 2 weeks until ortho follow-up General: "Can we do a shower?" Pt supine in bed upon OT arrival, agreeable to OT session.  Pain: unrated pain reported in ribs, activity, intermittent rest breaks, distractions provided for pain management, pt reports tolerable to proceed.   ADL: Pt completed ADL tasks in order to increase independence with tasks and increase activity tolerance to ADL tasks. Pt completed bed mobility at SBA using bed features from almost flat bed and completed transfer Min A stand pivot transfer from bed><W/C with increased time for slight posterior lean and Min A to correct. Pt then completing shower transfer with grab bars W/C><TTB with CGA and no LOB. Pt completed entirety of shower in sitting for safety and able to incorporate movement of use of LUE for ROM during bathing. Pt completing UB dressing at SBA for donning/doffing overhead and LB dressing at CGA standing at grab bars with unilateral support on grab bar while using opposite arm to manage pants over waist.    Other Treatments: OT educated pt on having arm out of sling at rest in order to stretch bicep muscles to prevent increased muscle tightness.    Pt supine in bed with bed alarm activated,  2 bed rails up, call light within reach and 4Ps assessed.   Therapy/Group: Individual Therapy  Velia Meyer, OTD, OTR/L 01/08/2024, 3:56 PM

## 2024-01-09 ENCOUNTER — Other Ambulatory Visit (HOSPITAL_COMMUNITY): Payer: Self-pay

## 2024-01-09 DIAGNOSIS — S2249XA Multiple fractures of ribs, unspecified side, initial encounter for closed fracture: Secondary | ICD-10-CM | POA: Insufficient documentation

## 2024-01-09 DIAGNOSIS — I951 Orthostatic hypotension: Secondary | ICD-10-CM | POA: Insufficient documentation

## 2024-01-09 DIAGNOSIS — R42 Dizziness and giddiness: Secondary | ICD-10-CM

## 2024-01-09 LAB — GLUCOSE, CAPILLARY
Glucose-Capillary: 98 mg/dL (ref 70–99)
Glucose-Capillary: 91 mg/dL (ref 70–99)
Glucose-Capillary: 100 mg/dL — ABNORMAL HIGH (ref 70–99)
Glucose-Capillary: 135 mg/dL — ABNORMAL HIGH (ref 70–99)

## 2024-01-09 MED ORDER — METOPROLOL SUCCINATE ER 50 MG PO TB24: 50.0000 mg | ORAL_TABLET | Freq: Every day | ORAL | Status: DC

## 2024-01-09 MED ORDER — POLYETHYLENE GLYCOL 3350 17 GM/SCOOP PO POWD
17.0000 g | Freq: Every day | ORAL | 0 refills | Status: DC
  Filled 2024-01-09: qty 238, 14d supply, fill #0

## 2024-01-09 MED ORDER — SACUBITRIL-VALSARTAN 24-26 MG PO TABS
1.0000 | ORAL_TABLET | Freq: Two times a day (BID) | ORAL | 0 refills | Status: DC
Start: 2024-01-09 — End: 2024-02-12
  Filled 2024-01-09: qty 60, 30d supply, fill #0

## 2024-01-09 MED ORDER — KETOCONAZOLE 2 % EX CREA
TOPICAL_CREAM | Freq: Two times a day (BID) | CUTANEOUS | 0 refills | Status: DC
  Filled 2024-01-09: qty 15, 14d supply, fill #0

## 2024-01-09 MED ORDER — IPRATROPIUM-ALBUTEROL 20-100 MCG/ACT IN AERS
1.0000 | INHALATION_SPRAY | Freq: Two times a day (BID) | RESPIRATORY_TRACT | 0 refills | Status: DC
  Filled 2024-01-09: qty 4, 60d supply, fill #0

## 2024-01-09 MED ORDER — SELENIUM SULFIDE 1 % EX LOTN
1.0000 | TOPICAL_LOTION | Freq: Every day | CUTANEOUS | 0 refills | Status: DC
  Filled 2024-01-09: qty 118, 118d supply, fill #0

## 2024-01-09 MED ORDER — OXYCODONE HCL 5 MG PO TABS
5.0000 mg | ORAL_TABLET | ORAL | 0 refills | Status: DC | PRN
Start: 2024-01-09 — End: 2024-02-26
  Filled 2024-01-09: qty 12, 2d supply, fill #0
  Filled 2024-01-11: qty 12, 2d supply, fill #1

## 2024-01-09 MED ORDER — IPRATROPIUM-ALBUTEROL 20-100 MCG/ACT IN AERS
1.0000 | INHALATION_SPRAY | Freq: Four times a day (QID) | RESPIRATORY_TRACT | Status: DC
  Filled 2024-01-09: qty 4

## 2024-01-09 MED ORDER — SENNOSIDES-DOCUSATE SODIUM 8.6-50 MG PO TABS
2.0000 | ORAL_TABLET | Freq: Two times a day (BID) | ORAL | 0 refills | Status: DC
  Filled 2024-01-09: qty 120, 30d supply, fill #0

## 2024-01-09 MED ORDER — APIXABAN 2.5 MG PO TABS
2.5000 mg | ORAL_TABLET | Freq: Two times a day (BID) | ORAL | 0 refills | Status: DC
  Filled 2024-01-09: qty 60, 30d supply, fill #0

## 2024-01-09 MED ORDER — IPRATROPIUM-ALBUTEROL 20-100 MCG/ACT IN AERS
1.0000 | INHALATION_SPRAY | Freq: Two times a day (BID) | RESPIRATORY_TRACT | Status: DC
  Administered 2024-01-09 – 2024-01-10 (×2): 1 via RESPIRATORY_TRACT
  Filled 2024-01-09: qty 4

## 2024-01-09 MED ORDER — ALBUTEROL SULFATE HFA 108 (90 BASE) MCG/ACT IN AERS: 1.0000 | INHALATION_SPRAY | RESPIRATORY_TRACT | Status: DC | PRN

## 2024-01-09 MED ORDER — ALBUTEROL SULFATE HFA 108 (90 BASE) MCG/ACT IN AERS
1.0000 | INHALATION_SPRAY | RESPIRATORY_TRACT | 0 refills | Status: DC | PRN
Start: 2024-01-09 — End: 2024-06-18
  Filled 2024-01-09: qty 6.7, 17d supply, fill #0

## 2024-01-09 NOTE — Discharge Summary (Signed)
 Physician Discharge Summary  Patient ID: Dustin Sampson MRN: [id number] DOB/AGE: 1966-05-01 58 y.o.  Admit date: 12/25/2023 Discharge date: 01/10/2024  Discharge Diagnoses:  Principal Problem:   Subdural hematoma (HCC) Active Problems:   Cough   Allergy   Non-insulin dependent type 2 diabetes mellitus (HCC)   Essential hypertension   Ischemic cardiomyopathy   MGUS (monoclonal gammopathy of unknown significance)   Acute kidney injury superimposed on chronic kidney disease (HCC)   Gout   Frequent falls   Ataxia   Multiple rib fractures   Orthostatic hypotension   Dizziness   Discharged Condition: stable  Significant Diagnostic Studies: DG Chest 2 View Result Date: 01/05/2024 CLINICAL DATA:  Wheezing. EXAM: CHEST - 2 VIEW COMPARISON:  12/28/2023, 12/25/2023 FINDINGS: Borderline hyperinflation. Resolved bilateral lung opacities from prior exam. No new airspace disease. No pleural effusion or pneumothorax. No pulmonary edema. Remote left rib fracture. IMPRESSION: Borderline hyperinflation. Resolved bilateral lung opacities from prior exam. No acute airspace disease. Electronically Signed   By: Narda Rutherford M.D.   On: 01/05/2024 14:39   DG Swallowing Func-Speech Pathology Result Date: 01/01/2024 Table formatting from the original result was not included. Modified Barium Swallow Study Patient Details Name: Dustin Sampson MRN: [id number] Date of Birth: [birth year] Today's Date: 01/01/2024 HPI/PMH: HPI: Pt is a 58 y/o male with PMH of ataxia, anxiety, insomnia, CVA, hypothyroidism, HTN, PAD, HFrEF, and frequent falls who presented to [facility] on 12/19/23 following a fall at home.  He is on plavix and with obvious facial injury.  Imaging revealed open nasal bone fracture, L anterior shoulder dislocation, and SDH.  Neurosurgery consulted and recommended repeat imaging.  ED labs notable for WBC 12.6, Na 134, and glucose 192.  Shoulder reduced in the ED and pt to be NWB with sling.  Follow up CT was stable  and neurosurgery recommended holding plavix. Clinical Impression: Clinical Impression: Patient presents with oropharyngeal swallowing function that is grossly WFL. No penetration/aspiration observed across all trials. Base of tongue retraction, epiglottic inversion, hyolaryngeal excursion/elevation, pharyngeal stripping wave, and UES distention all sufficient for full pharyngeal clearance and adequate airway protection. Recommend continuation of regular/thin liquid diet with medications administered whole with thin liquid. Patient endorses variable ataxia day to day and notes that on days were ataxia is more severe, he will cough when drinking thin liquids sequentially. Recommend that on days were ataxia is more significant, patient take small, slow sips and avoid straws, however given patient's intact cognition, patient is capable of judging when these modifications are indicated. SLP will continue to monitor swallowing function peripherally during motor speech therapy sessions. Factors that may increase risk of adverse event in presence of aspiration [name] 2021): No data recorded Recommendations/Plan: Swallowing Evaluation Recommendations Swallowing Evaluation Recommendations Recommendations: PO diet PO Diet Recommendation: Regular; Thin liquids (Level 0) Liquid Administration via: Cup; Straw Medication Administration: Whole meds with liquid Supervision: Patient able to self-feed Oral care recommendations: Oral care BID (2x/day) Treatment Plan Treatment Plan Treatment recommendations: Therapy as outlined in treatment plan below Follow-up recommendations: No SLP follow up Functional status assessment: Patient has had a recent decline in their functional status and demonstrates the ability to make significant improvements in function in a reasonable and predictable amount of time. Treatment frequency: Min 1x/week Treatment duration: 1 week Interventions: Compensatory techniques; Patient/family education  Recommendations Recommendations for follow up therapy are one component of a multi-disciplinary discharge planning process, led by the attending physician.  Recommendations may be updated based on patient status, additional functional  criteria and insurance authorization. Assessment: Orofacial Exam: Orofacial Exam Oral Cavity: Oral Hygiene: WFL Oral Cavity - Dentition: Adequate natural dentition Orofacial Anatomy: WFL Oral Motor/Sensory Function: Other (comment) (ataxia) Anatomy: Anatomy: WFL Boluses Administered: Boluses Administered Boluses Administered: Thin liquids (Level 0); Mildly thick liquids (Level 2, nectar thick); Moderately thick liquids (Level 3, honey thick); Puree; Solid  Oral Impairment Domain: Oral Impairment Domain Lip Closure: No labial escape Tongue control during bolus hold: Cohesive bolus between tongue to palatal seal Bolus preparation/mastication: Timely and efficient chewing and mashing Bolus transport/lingual motion: Brisk tongue motion Oral residue: Trace residue lining oral structures Location of oral residue : Tongue; Palate Initiation of pharyngeal swallow : Pyriform sinuses  Pharyngeal Impairment Domain: Pharyngeal Impairment Domain Soft palate elevation: No bolus between soft palate (SP)/pharyngeal wall (PW) Laryngeal elevation: Complete superior movement of thyroid cartilage with complete approximation of arytenoids to epiglottic petiole Anterior hyoid excursion: Complete anterior movement Epiglottic movement: Complete inversion Laryngeal vestibule closure: Complete, no air/contrast in laryngeal vestibule Pharyngeal stripping wave : Present - complete Pharyngeal contraction (A/P view only): N/A Pharyngoesophageal segment opening: Complete distension and complete duration, no obstruction of flow Tongue base retraction: No contrast between tongue base and posterior pharyngeal wall (PPW) Pharyngeal residue: Complete pharyngeal clearance  Esophageal Impairment Domain: No data recorded  Pill: No data recorded Penetration/Aspiration Scale Score: Penetration/Aspiration Scale Score 1.  Material does not enter airway: Thin liquids (Level 0); Mildly thick liquids (Level 2, nectar thick); Moderately thick liquids (Level 3, honey thick); Puree; Solid Compensatory Strategies: Compensatory Strategies Compensatory strategies: No   General Information: Caregiver present: No  Diet Prior to this Study: Regular; Thin liquids (Level 0)   Temperature : Normal   Respiratory Status: WFL   Supplemental O2: None (Room air)   History of Recent Intubation: No  Behavior/Cognition: Alert; Cooperative; Pleasant mood Self-Feeding Abilities: Able to self-feed Baseline vocal quality/speech: Normal Volitional Cough: Able to elicit Volitional Swallow: Able to elicit Exam Limitations: No limitations Goal Planning: Prognosis for improved oropharyngeal function: Good No data recorded No data recorded Patient/Family Stated Goal: none stated Consulted and agree with results and recommendations: Patient Pain: Pain Assessment Pain Assessment: 0-10 Pain Score: 3 Breathing: 0 Negative Vocalization: 0 Facial Expression: 0 Body Language: 0 Consolability: 0 PAINAD Score: 0 End of Session: Start Time:No data recorded Stop Time: No data recorded Time Calculation:No data recorded Charges: No data recorded SLP visit diagnosis: SLP Visit Diagnosis: Dysphagia, unspecified (R13.10) Past Medical History: Past Medical History: Diagnosis Date  Anxiety   Arthritis   Diabetes mellitus without complication (HCC)   Gout   Hyperlipemia   Hypertension   Hypothyroidism   Insomnia   Pneumonia   Stroke [facility])   Thyroid disease  Past Surgical History: Past Surgical History: Procedure Laterality Date  ABDOMINAL AORTOGRAM W/LOWER EXTREMITY Bilateral 08/21/2020  Procedure: ABDOMINAL AORTOGRAM W/LOWER EXTREMITY;  Surgeon: Sherren Kerns, MD;  Location: [facility] INVASIVE CV LAB;  Service: Cardiovascular;  Laterality: Bilateral;  ABDOMINAL AORTOGRAM W/LOWER EXTREMITY  N/A 09/04/2020  Procedure: ABDOMINAL AORTOGRAM W/LOWER EXTREMITY;  Surgeon: Sherren Kerns, MD;  Location: [facility] INVAS[facility] CV LAB;  Service: Cardiovascular;  Laterality: N/A;  COLONOSCOPY    x2  FEMORAL ARTERY EXPLORATION Left 09/04/2020  Procedure: FEMORAL ARTERY EXPLORATION;  Surgeon: Chuck Hint, MD;  Location: [facility] OR;  Service: Vascular;  Laterality: Left;  NASAL SINUS SURGERY    PERIPHERAL VASCULAR INTERVENTION Left 08/21/2020  Procedure: PERIPHERAL VASCULAR INTERVENTION;  Surgeon: Sherren Kerns, MD;  Location: [facility] INVASIVE CV LAB;  Service: Cardiovascular;  Laterality: Left;  sfa stent x 3  PERIPHERAL VASCULAR INTERVENTION  09/04/2020  Procedure: PERIPHERAL VASCULAR INTERVENTION;  Surgeon: Sherren Kerns, MD;  Location: [facility] INVASIVE CV LAB;  Service: Cardiovascular;;  Right SFA  RIGHT/LEFT HEART CATH AND CORONARY ANGIOGRAPHY N/A 11/16/2023  Procedure: RIGHT/LEFT HEART CATH AND CORONARY ANGIOGRAPHY;  Surgeon: Yates Decamp, MD;  Location: [facility] INVASIVE CV [facility];  Service: Cardiovascular;  Laterality: N/A;  SHOULDER ARTHROSCOPY WITH ROTATOR CUFF REPAIR AND SUBACROMIAL DECOMPRESSION Left 02/04/2021  Procedure: SHOULDER ARTHROSCOPY WITH ROTATOR CUFF REPAIR AND SUBACROMIAL DECOMPRESSION;  Surgeon: Jones Broom, MD;  Location: [facility] ORS;  Service: Orthopedics;  Laterality: Left;  WISDOM TOOTH EXTRACTION    WRIST SURGERY    bilat Jeannie Done, M.A., CCC-SLP Yetta Barre 01/01/2024, 9:33 AM  DG Chest 2 View Result Date: 12/28/2023 CLINICAL DATA:  Dyspnea and cough.  Follow-up. EXAM: CHEST - 2 VIEW COMPARISON:  12/25/2023 FINDINGS: Interval near complete resolution of the patchy airspace disease seen previously in the lung bases. No edema or focal consolidation. No evidence for pleural effusion. Cardiopericardial silhouette is at upper limits of normal for size. No acute bony abnormality. IMPRESSION: Interval near complete resolution of the bibasilar airspace disease seen previously. Electronically Signed    By: Kennith Center M.D.   On: 12/28/2023 09:33   DG Chest 1V REPEAT Same Day Result Date: 12/25/2023 CLINICAL DATA:  Dyspnea and cough. EXAM: CHEST - 1 VIEW SAME DAY COMPARISON:  12/19/2023 FINDINGS: Interval development of patchy airspace opacity in the left mid lung and left base as well as the right base, suspicious for multifocal pneumonia. No substantial pleural effusion. Cardiopericardial silhouette is at upper limits of normal for size. No acute bony abnormality. IMPRESSION: Interval development of patchy airspace opacity in the left mid lung and left base as well as the right base, suspicious for multifocal pneumonia. Electronically Signed   By: Kennith Center M.D.   On: 12/25/2023 10:05    Labs:  Basic Metabolic Panel: Recent Labs  Lab 01/04/24 0613 01/05/24 1230 01/06/24 0522 01/08/24 0515  NA 132* 129* 131* 132*  K 5.1 5.2* 4.7 4.4  CL 104 97* 103 104  CO2 23 22 16* 24  GLUCOSE 121* 143* 135* 176*  BUN 29* 34* 35* 33*  CREATININE 1.45* 1.60* 1.57* 1.31*  CALCIUM 9.1 9.3 9.1 8.7*    CBC:    Latest Ref Rng & Units 01/08/2024    5:15 AM 01/04/2024    6:13 AM 01/01/2024    5:44 AM  CBC  WBC 4.0 - 10.5 K/uL 8.3  7.8  7.9   Hemoglobin 13.0 - 17.0 g/dL 32.4  40.1  02.7   Hematocrit 39.0 - 52.0 % 33.7  35.8  35.9   Platelets 150 - 400 K/uL 277  297  317      CBG: Recent Labs  Lab 01/09/24 0609 01/09/24 1120 01/09/24 1620 01/09/24 2132 01/10/24 0619  GLUCAP 98 91 100* 135* 111*    Brief HPI:   Damico Partin is a 58 y.o. male with history of CVA with ataxia, HFrEF, insomnia, anxiety disorder, PAD, frequent falls who was admitted on 12/19/2023 after tripping and falling.  He had complaints of nose and right shoulder pain with epistaxis.  He did report he was not using his walker when he fell and workup revealed small area SDH along anterior falx, minimally displaced nasal fracture on left as well as shoulder dislocation which was reduced.  Dr. Dutch Quint recommended CT for monitoring  and stable he was cleared to resume Plavix in 7 days.  Nasal laceration was repaired and he was started on Keflex for a weeks duration per input from Dr. Jearld Fenton.  Orthopedics recommended nonweightbearing on left upper extremity with follow-up in 2 weeks.  On 02/27 he had reports of chest pain with cough as well as shortness of breath requiring supplemental oxygen.  Chest x-ray was negative and he was encouraged on pulmonary hygiene.  PT OT was consulted and patient was noted to require min to mod assist with staggering gait.  He was independent prior to admission and CIR was recommended due to functional decline.   Hospital Course: Herby Amick was admitted to rehab 12/25/2023 for inpatient therapies to consist of PT, ST and OT at least three hours five days a week. Past admission physiatrist, therapy team and rehab RN have worked together to provide customized collaborative inpatient rehab. He had significant wheezing with rales at admission and CXR done revealing multifocal PNA and doxycycline added to Keflex to complete 5 day course antibiotics. Follow up CXR 03/06 showed near complete resolution of bibasilar airspace disease. Wheezing has been managed with use of Duo nebs. This did recur once nebs d/c therefore combivent added to help manage symptoms. MBS performed for objective swallow evaluation and showed oropharyngeal swallow function to be [facility]. He was educated on swallow strategies to use when ataxia was more significant. Nicorette gum was added to help manage nicotine withdrawal symptoms and he has been educated on importance of nicotine cessation to help with pulmonary and cardiac status.   CXR was repeated on 3/14 due to ongoing issues with wheezing and showed resolution of lung base opacities. Lidocaine patches added to help manage rib and left shoulder pain.   He has continued to require oxycodone 10 qid prn due as reported that 5 mg oxycodone was ineffective. He was advised to wean down at discharge  and follow up with pain provider for input on resuming hydrocodone.  Dr. Ave Filter was consulted for input at 2-week mark and recommended range of motion as tolerated with TDWB for transfers onlyDiflucan added X 5 days for thrush due to antibiotics. Blood pressures were monitored on TID basis and has been very labile with significant orthostatic drop with dizziness and limitation in therapy initially. Abdominal binder and TEDs were ordered for BP support in addition to vestibular evaluation due to reports of chronic intermittent dizziness.   Jardiance was discontinued due to AKI and he was advised to increase fluid intake. No signs of overload noted and weight is averaging at 197-199 lbs overall. Aldactone was discontinued on 03/14 due to upward trend in serum potassium and follow up labs showed improved improvement in sodium and  potassium level as well as SCr off spironolactone. Cardiology was contacted to review medications prior to discharge and has set up follow up in office next week. His diabetes has been monitored with ac/hs CBG checks and SSI was use prn for tighter BS control.  Blood sugars are currently controlled on diet alone and metformin not resumed due to acute on chronic renal failure.Marland Kitchen  He was advised to continue monitoring blood sugars and follow-up with PCP for input on medication adjustment.  His progress has been limited by his baseline ataxia which fluctuates and puts him at increased risk for falls.  He continues to require min assist and due to nonfunctional gait, was fitted with motorized wheelchair for safety.  He currently requires supervision to min assist to receive follow-up home  health PT and OT by [facility] home health after discharge   Rehab course: During patient's stay in rehab weekly team conferences were held to monitor patient's progress, set goals and discuss barriers to discharge. At admission, patient required min to mod assist with mobility and max assist with ADL tasks. He  exhibited mild ataxic dysarthria and cognition was [facility].  He  has had improvement in activity tolerance, balance, postural control as well as ability to compensate for deficits. He has had improvement in functional use LUE as well as improvement in awareness.  He is able to complete ADL tasks with min assist to supervision. He requires min assist for transfers and min to mod to ambulate 25 feet with use of hemiwalker.  He is able to climb 12 stairs with min assist.  Family education has been completed.    Discharge disposition: 06-Home-Health Care Svc  Diet: Heart Healthy  Special Instructions: 1.Touch down weigh bearing for left arm and sling prn for comfort --follow up with Dr. Ave Filter for X rays and input on WB status.   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; "it makes me bleed" and caused abdominal pain -Pt can take in small amounts(??)   Ibuprofen Other (See Comments)   CKD   Nsaids Other (See Comments)   Avoids due to kidney issues   Avelox [moxifloxacin Hcl In Nacl] Nausea And Vomiting, Other (See Comments)   "made me really sick"   Colchicine Diarrhea   Gabapentin Other (See Comments)   Affected balance- no longer takes this   Latex Other (See Comments)   Irritates the skin   Penicillins Other (See Comments)   Severe GI upset- occurred in childhood        Medication List     STOP taking these medications    cephALEXin 500 MG capsule Commonly known as: KEFLEX   escitalopram 5 MG tablet Commonly known as: LEXAPRO   Excedrin Extra Strength 250-250-65 MG tablet Generic drug: aspirin-acetaminophen-caffeine   gabapentin 300 MG capsule Commonly known as: NEURONTIN   HYDROcodone-acetaminophen 10-325 MG tablet Commonly known as: NORCO   Jardiance 10 MG Tabs tablet Generic drug: empagliflozin   metFORMIN 500 MG 24 hr tablet Commonly known as: GLUCOPHAGE-XR   pseudoephedrine 120  MG 12 hr tablet Commonly known as: SUDAFED   spironolactone 25 MG tablet Commonly known as: ALDACTONE   tadalafil 20 MG tablet Commonly known as: CIALIS       TAKE these medications    acetaminophen 500 MG tablet Commonly known as: TYLENOL Take 500-1,000 mg by mouth every 6 (six) hours as needed (for pain).   albuterol 108 (90 Base) MCG/ACT inhaler Commonly known as: VENTOLIN HFA Inhale 1-2 puffs into the lungs every 4 (four) hours as needed for wheezing or shortness of breath (and prolonged coughing).   clonazePAM 0.5 MG tablet Commonly known as: KLONOPIN Take 0.5 mg by mouth daily as needed for anxiety.   clopidogrel 75 MG tablet Commonly known as: PLAVIX Take 1 tablet (75 mg total) by mouth daily.   Combivent Respimat 20-100 MCG/ACT Aers respimat Generic drug: Ipratropium-Albuterol Inhale 1 puff into the lungs 2 (two) times daily.   Entresto 24-26 MG Generic drug: sacubitril-valsartan Take 1 tablet by mouth 2 (two) times daily.   Febuxostat 80 MG Tabs Take 80 mg by mouth every evening.   fenofibrate micronized 200 MG capsule Commonly known as: LOFIBRA  Take 200 mg by mouth daily.   guaiFENesin-codeine 100-10 MG/5ML syrup Take 10 mLs by mouth every 4 (four) hours as needed for cough (for severe cough).   ketoconazole 2 % cream Commonly known as: NIZORAL Apply topically 2 (two) times daily.   levothyroxine 200 MCG tablet Commonly known as: SYNTHROID Take 200 mcg by mouth at bedtime.   lidocaine 4 % Commonly known as: HM Lidocaine Patch Place 1 patch onto the skin daily.   melatonin 3 MG Tabs tablet Take 1 tablet (3 mg total) by mouth at bedtime as needed.   metoprolol succinate 50 MG 24 hr tablet Commonly known as: TOPROL-XL Take 1 tablet (50 mg total) by mouth at bedtime.   naloxone 4 MG/0.1ML Liqd nasal spray kit Commonly known as: NARCAN Use in case of overdose   Neo-Synephrine Cold/Allrg Mild 0.25 % nasal spray Generic drug:  phenylephrine Place 1 spray into both nostrils every 6 (six) hours as needed for congestion.   nicotine polacrilex 4 MG gum Commonly known as: NICORETTE Take 4 mg by mouth as needed for smoking cessation.   oxyCODONE 5 MG immediate release tablet--Rx# 25 pills.  Commonly known as: Oxy IR/ROXICODONE Take 1-2 tablets (5-10 mg total) by mouth every 4 (four) hours as needed for severe pain (pain score 7-10). Notes to patient: LIMIT TO 6 PILLS PER DAY AS NEEDED-- DO NOT USE HYDROCODONE TILL YOU CONTACT/FOLLOW UP WITH YOUR PAIN CLINIC PROVIDER.    pantoprazole 40 MG tablet Commonly known as: PROTONIX Take 40 mg by mouth at bedtime as needed (for heartburn).   polyethylene glycol powder 17 GM/SCOOP powder Commonly known as: GLYCOLAX/MIRALAX Take 1 capful (17 g) with water by mouth daily. Notes to patient: USE THIS IF YOU DO NOT HAVE A BM IN 24 HOURS   pravastatin 80 MG tablet Commonly known as: PRAVACHOL Take 80 mg by mouth daily.   selenium sulfide 1 % Lotn Commonly known as: SELSUN Apply 1 Application topically daily.   senna-docusate 8.6-50 MG tablet Commonly known as: Senokot-S Take 2 tablets by mouth 2 (two) times daily.   temazepam 30 MG capsule Commonly known as: RESTORIL Take 30 mg by mouth at bedtime.        Follow-up Information     Jarrett Soho, PA-C Follow up.   Specialty: Family Medicine Why: Call in 1-2 days for post hospital follow up Contact information: [address] [place] [address] [phone]         Genice Rouge, MD. Call.   Specialty: Physical Medicine and Rehabilitation Why: As needed Contact information: [address] [place] [address] [phone]         Jones Broom, MD Follow up.   Specialty: Orthopedic Surgery Contact information: [address] [place] [address] [phone]         Jodelle Red, MD Follow up on 01/18/2024.   Specialty: Cardiology Why:  Be there at 9:10 am for 9:20 appt Contact information: [address] [place] [address] [phone]                 Signed: Jacquelynn Cree 01/10/2024, 5:36 PM

## 2024-01-09 NOTE — Plan of Care (Signed)
  Problem: RH Balance Goal: LTG Patient will maintain dynamic standing with ADLs (OT) Description: LTG:  Patient will maintain dynamic standing balance with assist during activities of daily living (OT)  Outcome: Not Met (add Reason) Flowsheets (Taken 01/09/2024 1234) LTG: Pt will maintain dynamic standing balance during ADLs with: (not met d/t ataxia fluccuation) Minimal Assistance - Patient > 75%   Problem: RH Dressing Goal: LTG Patient will perform lower body dressing w/assist (OT) Description: LTG: Patient will perform lower body dressing with assist, with/without cues in positioning using equipment (OT) Outcome: Not Met (add Reason) Flowsheets (Taken 01/09/2024 1234) LTG: Pt will perform lower body dressing with assistance level of: (balance deficits d/t fluccuating ataxia) Minimal Assistance - Patient > 75%   Problem: RH Toileting Goal: LTG Patient will perform toileting task (3/3 steps) with assistance level (OT) Description: LTG: Patient will perform toileting task (3/3 steps) with assistance level (OT)  Outcome: Not Met (add Reason) Flowsheets (Taken 01/09/2024 1234) LTG: Pt will perform toileting task (3/3 steps) with assistance level: (d/t balance deficits from fluccuating ataxia) Minimal Assistance - Patient > 75%   Problem: RH Toilet Transfers Goal: LTG Patient will perform toilet transfers w/assist (OT) Description: LTG: Patient will perform toilet transfers with assist, with/without cues using equipment (OT) Outcome: Not Met (add Reason) Flowsheets (Taken 01/09/2024 1234) LTG: Pt will perform toilet transfers with assistance level of: (d/t balance deficits from fluccuating ataxia) Minimal Assistance - Patient > 75%   Problem: RH Tub/Shower Transfers Goal: LTG Patient will perform tub/shower transfers w/assist (OT) Description: LTG: Patient will perform tub/shower transfers with assist, with/without cues using equipment (OT) Outcome: Not Met (add Reason) Flowsheets (Taken  01/09/2024 1234) LTG: Pt will perform tub/shower stall transfers with assistance level of: (d/t balance deficits from fluccuating ataxia) Minimal Assistance - Patient > 75%   Problem: RH Grooming Goal: LTG Patient will perform grooming w/assist,cues/equip (OT) Description: LTG: Patient will perform grooming with assist, with/without cues using equipment (OT) Outcome: Completed/Met Flowsheets (Taken 12/26/2023 1630) LTG: Pt will perform grooming with assistance level of: Independent with assistive device    Problem: RH Bathing Goal: LTG Patient will bathe all body parts with assist levels (OT) Description: LTG: Patient will bathe all body parts with assist levels (OT) Outcome: Completed/Met Flowsheets Taken 01/09/2024 1234 LTG: Position pt will perform bathing: (seated) Shower Taken 12/26/2023 1630 LTG: Pt will perform bathing with assistance level/cueing: Supervision/Verbal cueing   Problem: RH Dressing Goal: LTG Patient will perform upper body dressing (OT) Description: LTG Patient will perform upper body dressing with assist, with/without cues (OT). Outcome: Completed/Met Flowsheets (Taken 12/26/2023 1630) LTG: Pt will perform upper body dressing with assistance level of: Minimal Assistance - Patient > 75%

## 2024-01-09 NOTE — Patient Care Conference (Signed)
 Inpatient RehabilitationTeam Conference and Plan of Care Update Date: 01/09/2024   Time: 1146 am    Patient Name: Dustin Sampson      Medical Record Number: [id number]  Date of Birth: December 17, 1965 Sex: Male         Room/Bed: 6N62X/5M84X-32 Payor Info: Payor: [employer] / Plan: [facility] / Product Type: *No Product type* /    Admit Date/Time:  12/25/2023  5:33 PM  Primary Diagnosis:  Subdural hematoma [facility])  Hospital Problems: Principal Problem:   Subdural hematoma (HCC) Active Problems:   Allergy   Non-insulin dependent type 2 diabetes mellitus (HCC)   Essential hypertension   Ischemic cardiomyopathy   MGUS (monoclonal gammopathy of unknown significance)   Acute kidney injury superimposed on chronic kidney disease (HCC)   Gout   Frequent falls   Ataxia   Multiple rib fractures   Orthostatic hypotension   Dizziness    Expected Discharge Date: Expected Discharge Date: 01/10/24  Team Members Present: Physician leading conference: Dr. Genice Rouge Social Worker Present: Dossie Der, LCSW Nurse Present: Konrad Dolores, RN PT Present: Truitt Leep, PT OT Present: Velia Meyer, OT PPS Coordinator present : Fae Pippin, SLP     Current Status/Progress Goal Weekly Team Focus  Bowel/Bladder   Patient is continent x2. Last BM 3/16.   Remain continent, medicate prn with constipation.   Monitor for changes in bowel and bladder function.    Swallow/Nutrition/ Hydration               ADL's   SBA UB dressing, CGA LB dressing, bathing SBA, grooming/hygiene SBA, CGA/Min A transfers d/t fatigue levels   SBA overall, Min A UB dressing   D/C planning    Mobility   fluctuating ataxia, CGA/supervision bed, min/mod transfers, gait (non-functional Ambulator) supervision PWC   supervision with LRAD  PWC mobility and discharge planning    Communication                Safety/Cognition/ Behavioral Observations               Pain   Pain 7/10 to left ribcage area.  Lidoderm patch scheduled and prn medication given.   Pain less than or equal to 2.   Monitor for pain q shift and prn.    Skin   Skin is clean, dry and intact.   No skin breakdown.  Monitor skin q shift and prn.      Discharge Planning:  Wife was here for education on last sunday and saw husband in therapies. Plan to go home and wife and children to assist at times will be alone. WC evaluation being completed and will work on DC needs.   Team Discussion: Patient admitted post fall with TBI. Patient has a left shoulder dislocation, dizziness from double vision,ataxia and nasal fracture. Patient limited by left rib pain, cough,orthostatic hypotension: medication adjusted by MD.   Patient on target to meet rehab goals: yes, Patient requires Stand by assist with  UB dressing. Patient requires CGA with LB dressing. Patient requires stand by assist with bathing and  grooming/hygiene. Patient requires  CGA/Min A with transfers and requires supervision using a power wheelchair. Patient is on target with goals set for  min A- supervision at discharge.   *See Care Plan and progress notes for long and short-term goals.   Revisions to Treatment Plan:  Power wheelchair evaluation 01/09/24 at 2pm   Teaching Needs: Safety, transfers, toileting, medications, etc.   Current Barriers to Discharge: Decreased  caregiver support  Possible Resolutions to Barriers: Family education Home health follow -up DME: platform walker, power wheelchair     Medical Summary Current Status: continent B/B except 1 this AM; LBM 2 days ago- skin OK- pain in L shoulder and L ribcage; calling Cards since not on Aldactone/Jardiance due ot renal fxn  Barriers to Discharge: Self-care education;Uncontrolled Pain;Weight bearing restrictions  Barriers to Discharge Comments: limited by ataxia; getting w/c evlauation today- Possible Resolutions to [employer] Focus: meeting goals- will try to get nebulaizer to go home-  since not doing great with inhalers by itself- d/c tomorrow   Continued Need for Acute Rehabilitation Level of Care: The patient requires daily medical management by a physician with specialized training in physical medicine and rehabilitation for the following reasons: Direction of a multidisciplinary physical rehabilitation program to maximize functional independence : Yes Medical management of patient stability for increased activity during participation in an intensive rehabilitation regime.: Yes Analysis of laboratory values and/or radiology reports with any subsequent need for medication adjustment and/or medical intervention. : Yes   I attest that I was present, lead the team conference, and concur with the assessment and plan of the team.   Gwenyth Allegra 01/09/2024, 8:55 PM

## 2024-01-09 NOTE — Progress Notes (Signed)
 Patient ID: Dustin Sampson, male   DOB: April 20, 1966, 58 y.o.   MRN: [id number]  Met with pt to give him the team conference update regarding reaching his goals and being ready for discharge tommorrow. He feels ready and hopeful he will do well at home. See in am for nay last minute concerns.

## 2024-01-09 NOTE — Progress Notes (Signed)
 Inpatient Rehabilitation Care Coordinator Discharge Note   Patient Details  Name: Dustin Sampson MRN: [id number] Date of Birth: 1965/12/12   Discharge location: HOME WITH WIFE WHO DOES WORK AND ADULT CHILDRE WILL BE TIMES WHEN HE IS ALONE  Length of Stay: 15 DAYS  Discharge activity level: SBA-MIN LEVEL STAY IN [facility] WHEN ALONE  Home/community participation: HOMEBOUND  Patient response WU:JWJXBJ Literacy - How often do you need to have someone help you when you read instructions, pamphlets, or other written material from your doctor or pharmacy?: Never  Patient response YN:WGNFAO Isolation - How often do you feel lonely or isolated from those around you?: Rarely  Services provided included: RD, MD, PT, OT, RN, CM, Pharmacy, Neuropsych, SW, TR  Financial Services:  Field seismologist Utilized: Private Insurance [employer]  Choices offered to/list presented to: PT AND WIFE  Follow-up services arranged:  Home Health, DME, Patient/Family has no preference for HH/DME agencies Home Health Agency: [facility]  PT & OT    DME : [facility] STALLS POWER CHAIR   HAS OTHER EQUIPMENT FROM PREVIOUS ADMISSIONS  Patient response to transportation need: Is the patient able to respond to transportation needs?: Yes In the past 12 months, has lack of transportation kept you from medical appointments or from getting medications?: No In the past 12 months, has lack of transportation kept you from meetings, work, or from getting things needed for daily living?: No   Patient/Family verbalized understanding of follow-up arrangements:  Yes  Individual responsible for coordination of the follow-up plan: SELF AND KIM-WIFE [phone]  Confirmed correct DME delivered: Lucy Chris 01/09/2024    Comments (or additional information):PT DID WELL AND MADE GOOD PROGRESS. HE STILL FLUCUATES IN HIS FUNCTION DUE TO HIS ATAXIA. WIFE WAS IN FOR FAMILY TRAINING AND IT WENT  WELL  Summary of Stay    Date/Time Discharge Planning CSW  01/09/24 8469 Wife was here for education on last sunday and saw husband in therapies. Plan to go home and wife and children to assist at times will be alone. WC evaluation being completed and will work on DC needs. RGD  01/02/24 0954 HOme with wife and chidlren who work but buddy wil come over to make sure his needs are met. Wants to get manual wheelchair. Await team's recommnedations RGD  12/26/23 0856 Home with wife and two grown children-all work but buddy will come by durng the day to make sure ok. Has DME-power chair is broken RGD       Doyt Castellana, Lemar Livings

## 2024-01-09 NOTE — Progress Notes (Signed)
 PROGRESS NOTE   Subjective/Complaints:  Dustin Sampson reports still coughing- all night, but didn't take cough meds with codeine at all-  doid take nebs  Has w/c evaluation today- Dustin Sampson ideally wants power w/c- although doesn't have way to transport initially- but willing to get something eventually.   We discussed inhalers and using them instead of Nebs   ROS:   Dustin Sampson denies SOB, abd pain, CP, N/V/C/D, and vision changes   (+) Cough- less wheezing Left sided rib pain- and anterior shoulder pain   Objective:   No results found.     Recent Labs    01/08/24 0515  WBC 8.3  HGB 11.1*  HCT 33.7*  PLT 277     Recent Labs    01/08/24 0515  NA 132*  K 4.4  CL 104  CO2 24  GLUCOSE 176*  BUN 33*  CREATININE 1.31*  CALCIUM 8.7*    Intake/Output Summary (Last 24 hours) at 01/09/2024 0925 Last data filed at 01/09/2024 0825 Gross per 24 hour  Intake 360 ml  Output 950 ml  Net -590 ml        Physical Exam: Vital Signs Blood pressure 112/74, pulse 85, temperature 97.8 F (36.6 C), resp. rate 18, height 5\' 9"  (1.753 m), weight 88.8 kg, SpO2 96%.     General: awake, alert, appropriate, sitting up in bed; NAD HENT: conjugate gaze; oropharynx moist CV: regular rate and rhythm; no JVD Pulmonary: CTA B/L; no W/R/R- good air movement- a few coughs, but clear- no wheezing GI: soft, NT, ND, (+)BS Psychiatric: appropriate- interactive Neurological: Ox3   MSK- not wearing sling lately  TTP over anterior shoulder and L lateral chest today significant TTP over L anterior shoulder and L sided ribs- reproducible pain Skin: No evidence of breakdown, no evidence of rash MSK- bilateral shoulders symmetric , no subluxation no pain to palpation  Less dysarthric this AM-  Neurologic: Cranial nerves II through XII intact, motor strength is 5/5 in RIght  deltoid, bicep, tricep, grip,5/5 right and 4/5 left  hip flexor, knee extensors,  ankle dorsiflexor and plantar flexor Left grip 4/5   Assessment/Plan: 1. Functional deficits which require 3+ hours per day of interdisciplinary therapy in a comprehensive inpatient rehab setting. Physiatrist is providing close team supervision and 24 hour management of active medical problems listed below. Physiatrist and rehab team continue to assess barriers to discharge/monitor patient progress toward functional and medical goals  Care Tool:  Bathing              Bathing assist Assist Level: Maximal Assistance - Patient 24 - 49%     Upper Body Dressing/Undressing Upper body dressing   What is the patient wearing?: Pull over shirt    Upper body assist Assist Level: Maximal Assistance - Patient 25 - 49%    Lower Body Dressing/Undressing Lower body dressing            Lower body assist Assist for lower body dressing: Maximal Assistance - Patient 25 - 49%     Toileting Toileting    Toileting assist Assist for toileting: Maximal Assistance - Patient 25 - 49%     Transfers Chair/bed transfer  Transfers  assist     Chair/bed transfer assist level: Contact Guard/Touching assist     Locomotion Ambulation   Ambulation assist      Assist level: Contact Guard/Touching assist (close stand-by) Assistive device: Walker-platform Max distance: 350   Walk 10 feet activity   Assist     Assist level: Contact Guard/Touching assist Assistive device: Walker-platform   Walk 50 feet activity   Assist Walk 50 feet with 2 turns activity did not occur: Safety/medical concerns  Assist level: Contact Guard/Touching assist Assistive device: Walker-platform    Walk 150 feet activity   Assist Walk 150 feet activity did not occur: Safety/medical concerns  Assist level: Contact Guard/Touching assist Assistive device: Walker-platform    Walk 10 feet on uneven surface  activity   Assist     Assist level: Minimal Assistance - Patient > 75% Assistive  device: Walker-platform   Wheelchair     Assist Is the patient using a wheelchair?: Yes Type of Wheelchair: Manual    Wheelchair assist level: Supervision/Verbal cueing Max wheelchair distance: 200+    Wheelchair 50 feet with 2 turns activity    Assist        Assist Level: Supervision/Verbal cueing   Wheelchair 150 feet activity     Assist      Assist Level: Supervision/Verbal cueing   Blood pressure 112/74, pulse 85, temperature 97.8 F (36.6 C), resp. rate 18, height 5\' 9"  (1.753 m), weight 88.8 kg, SpO2 96%.  Medical Problem List and Plan: 1. Functional deficits secondary to TBI             -patient may  shower             -ELOS/Goals: 14-18d Sup  D/c 3/19  Con't CIR Dustin Sampson, OT and SLP for swallowing  W/c evaluation today  Team conference today to finalize d/c.  2.  Antithrombotics: -DVT/anticoagulation:  Mechanical: Sequential compression devices, below knee Bilateral lower extremities 3/11- will start Eliquis- 2.5 mg BID since doesn't want lovenox injections             -antiplatelet therapy: N/A--Plavix to be held for a week per Dr. Jordan Likes --Resume on 03/04  3. Pain Management:  On IV hydromorphone and Oxycodone prn.              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   3/4- has Percocet ordered BID as well as q4 hours prn- con't regimen and monitor- hasn't had this AM, so asked him to call for meds- last got 6 hours prior   3/5- changed percocet to 1-2 tabs q4 hours prn  3/7- pain doing ok- con't regimen  3/8- pain controlled- con't regimen  3/9- pain worse this Am- will add lidoderm patches- 3of them at night per Dustin Sampson request  3/13- changed pain meds back to 5-10 mg oxy q4 hours prn for pain and ocn't Percocet 7.5 mg BID- since having more shoulder/rib pain this AM  - although in L chest appears to be reproducible- no Sweating, SOB/DOE on it nor L jaw or L arm pain- just pain associated with rib fx's  and L shoulder pain from dislocation- could be from Platform walker- will ask therapy to wai ton platform walker today  3/14- pain has moved more to L lateral chest and anterior shoulder- which is c/w MSK pain- doing better with increased pain meds  3/17- pain doing better this AM  3/18- pain stable-  4. Mood/Behavior/Sleep: LCSW to follow  for evaluation and support.              -antipsychotic agents: N/A   5. Neuropsych/cognition: This patient is capable of making decisions on his own behalf. 6. Skin/Wound Care: Routine pressure measures 7. Fluids/Electrolytes/Nutrition: Monitor I/O. Check CMET in am 8.  Left shoulder dislocation: NWB with sling X 2 weeks. Follow up with ortho in 2 weeks.  Dr Ave Filter did L RTC repair several years ago 9.  Nasal Fracture: Continue Keflex thru 03/05-->follow up with ENT in a week 10.  Pneumonia- Left chest wall pain/SOB: Wean oxygen. Encourage pulmonary hygiene  3/4- wasn't wearing this AM  3/8- off O2 11. Chronic HFrEF: Compensated. Daily weight and strict I/O             --was on Jardiance, Spironolactone, Entresto- held due to renal function  3/11- Weight up 6.5 lbs in 1 day- will Restart Entresto, but not sure if weight his real weight- will recheck in AM  3/12- weight back down to 89 kg- con't regimen  3/13- Weight 90 kg today- will monitor trend- concerned about giving Lasix because Cr is already at 1.45 up from baseline of 1.1 BNP only mildly elevated, CXR without pleural effusion , no peripheral edema   3/17- Weight 91.2 kg today- up dramatically, not sure is correct- will monitor for trend  Weight down to 88.8 kg today- will monitor trend 12. Anxiety d/o: Continue Klonopin prn 13. Insomnia: On Restoril 30 mg/hs 14. Wheezing: Hx smoking plus not taking deep breaths due to left rib painFlutter valve qid for pulmonary hygiene. Will schedule  duo nebs qid for now. 3/12- Con't nebs prn- since was switched from scheduled to prn by respiratory- asked Dustin Sampson  to ask for and this AM, asked nursing to give.  3/13- added codeine cough meds for cough- nebs prn  3/14- coughing/wheezing more- will add Prednisone 20 mg daily x3 days and monitor- might need CXR, additional work up if doesn't improve- nebs stopped yesterday since cannot go home on it- but will change Combivent to QID scheduled CXR without evidence of PNA has emphysematous changes 3/17- getting Duonebs around the clock- is helping- steroids helped, some, but spiked his CBGs   3/18- will change Duonebs to Combivent QID as well as Albuterol inhaler- explained to do 2 puffs, which should be closer ot nebs  15.  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 Thrush  3/4- will give Diflucan 200 mg x1 and then 100 mg daily x 6 days- might need to increase dose if doesn't improve.   3/5- will add Magic mouthwash with lidocaine- QID- for 7 days  3/7- Increased Diflucan to 200mg  yesterday- 15% better now- con't regimen  No oral lesions, overall better  17. Constipation  3/4- will give Sorbitol 30 mg x1 to get him to have BM after therapy- has been 3 days.   3/5- had good BM- con't regimen  3/7- will give Sorbitol since it's been 3 days on Senna 2 tabs BID and Miralax- con't regimen otherwise  3/8- LBM 2 days ago- if no results by tomorrow, will give Sorbitol  3/9- BM x 2  3/12- LBM 3 days ago- per chart- Dustin Sampson wants sorbitol- will give after therapy today  3/16 last BM 3/15  3/17-  LBM yesterday 18. AKI  3/4- will recheck labs Thursday since with HF, [place]'t want him to get too wet.   3/5- asked Dustin Sampson to drink a little more than currently drinking  3/6- Dustin Sampson's BUN up to 33 and Cr up to 1.50- think it's because hurts to drink/eat, so will give some IVFs to tune back up- usually Cr <1 and is now 1.5- will give 75cc/hour  starting after therapy and do for 15 hours- won't do more, since has HF.       Latest Ref Rng & Units 01/08/2024    5:15 AM 01/06/2024    5:22 AM 01/05/2024   12:30 PM  BMP  Glucose 70 - 99 mg/dL 191  478  295   BUN 6 - 20 mg/dL 33  35  34   Creatinine 0.61 - 1.24 mg/dL 6.21  3.08  6.57   Sodium 135 - 145 mmol/L 132  131  129   Potassium 3.5 - 5.1 mmol/L 4.4  4.7  5.2   Chloride 98 - 111 mmol/L 104  103  97   CO2 22 - 32 mmol/L 24  16  22    Calcium 8.9 - 10.3 mg/dL 8.7  9.1  9.3    Still above baseline of 1.1 but appears to be mainly hydration related , cont to push po fluids  3/11- will restart Entresto, but not Jardiance- still not drinking as much as should  3/13- BUN 29 and Cr 1.45- staying stable, not sure if this is new baseline, or not? 3/15 won't resume diuretics unles there is sign of fluid retention   3/17- Cr 1.33 looks much better- and BUN 33-  19. Wet cough  3/13- Dustin Sampson reports when uses nebs, coughs more for a few minutes, but taking Codeine cough meds- help ssome as well   3/17- coughing is getting better 20. Orthostatic hypotension vs dizziness from double vision  3/8- will try TEDs and abd binder and ACE wraps- if doesn't improve, will add Midodrine.   3/9- patch for 1 eye helping as well as TEDs/ACE wraps- not tried abd binder yet  3/13- per therapy, less dizziness- since walked 350 ft yesterday- will monitor Vitals:   01/09/24 0445 01/09/24 0741  BP: 112/74   Pulse: 85   Resp: 18   Temp: 97.8 F (36.6 C)   SpO2: 96% 96%   Baseline BPs ok , no orthostatic changes per Dustin Sampson note on 3/9,   3/11- No more dizziness with TEDs.   Off  Aladactone because K+ is rising- esp in setting of elevated Cr- as well- monitor his labs as well as Cr/K+ and weight gain this weekend.  K+ 4.7 on 3/15  3/17- K+ 4.4   I spent a total of 38   minutes on total care today- >50% coordination of care- due to  D/w Dustin Sampson about Inhalers vs Nebs- as well as d/w nursing- and team conference to  finalize d/c.   Will need f/u with me LOS: 15 days A FACE TO FACE EVALUATION WAS PERFORMED  Xandra Laramee 01/09/2024, 9:25 AM

## 2024-01-09 NOTE — Progress Notes (Signed)
 Patient ID: Dustin Sampson, male   DOB: June 16, 1966, 58 y.o.   MRN: [id number]  [facility] home health has accepted his referral for home health PT & OT. Will update after team conference pt feels ready for discharge tomorrow.

## 2024-01-09 NOTE — Progress Notes (Signed)
 Physical Therapy Discharge Summary  Patient Details  Name: Dustin Sampson MRN: [id number] Date of Birth: [birth year]  Date of Discharge from PT service:January 09, 2024  Today's Date: 01/09/2024 PT Individual Time: 1305-1400 PT Individual Time Calculation (min): 55 min    Patient has met 1 of 12 long term goals due to ability to compensate for deficits and improved awareness.  Patient to discharge at a wheelchair level  min A to supervision .  Pt's spouse participated in family training and demonstrates competency and understanding with how to support patient appropriately   Reasons goals not met: Pt with baseline condition of ataxia and with limited progress with mobility due to increased fall risk. Pt is non-functional with gait and will discharge at supervision level for power wheelchair mobility. Pt continues to require min A with transfers due to ataxia and balance impairments. Pt adequate for discharge with family support.   Recommendation:  Patient will benefit from ongoing skilled PT services in home health setting to continue to advance safe functional mobility, address ongoing impairments in coordination, balance, and minimize fall risk.  Equipment: Speciality power wheelchair, hemiwalker   Reasons for discharge: lack of progress toward goals and discharge from hospital  Patient/family agrees with progress made and goals achieved: Yes  PT Discharge Precautions/Restrictions Precautions Precautions: Fall Shoulder Interventions: Shoulder sling/immobilizer;For comfort (left) Precaution/Restrictions Comments: ataxia Restrictions Weight Bearing Restrictions Per Provider Order: Yes LUE Weight Bearing Per Provider Order: Touch down weight bearing Pain Interference Pain Interference Pain Effect on Sleep: 4. Almost constantly Pain Interference with Therapy Activities: 1. Rarely or not at all Pain Interference with Day-to-Day Activities: 8. Unable to answer Vision/Perception  Vision -  History Ability to See in Adequate Light: 0 Adequate Perception Perception: Within Functional Limits Praxis Praxis: WFL  Cognition Overall Cognitive Status: Within Functional Limits for tasks assessed Arousal/Alertness: Awake/alert Orientation Level: Oriented X4 Focused Attention: Appears intact Memory: Appears intact Awareness: Appears intact Problem Solving: Appears intact Reasoning: Appears intact Safety/Judgment: Appears intact Sensation Sensation Light Touch: Impaired by gross assessment Hot/Cold: Appears Intact Proprioception: Appears Intact Stereognosis: Not tested Additional Comments: 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 Coordination Gross Motor Movements are Fluid and Coordinated: No Fine Motor Movements are Fluid and Coordinated: No Coordination and Movement Description: ataxia at baseline Finger Nose Finger Test: ataxia, L UE ROM limited 2/2 pain Heel Shin Test: ataxia Motor  Motor Motor: Ataxia Motor - Skilled Clinical Observations: baseline ataxia; non-functional ambulator Motor - Discharge Observations: ataxia at baseline  Mobility Bed Mobility Bed Mobility: Rolling Right;Supine to Sit;Rolling Left;Sit to Supine Rolling Right: Independent with assistive device Rolling Left: Independent with assistive device Right Sidelying to Sit: Supervision/Verbal cueing Supine to Sit: Independent with assistive device Sit to Supine: Independent with assistive device Transfers Transfers: Sit to Stand;Stand to Sit;Stand Pivot Transfers Sit to Stand: Minimal Assistance - Patient > 75% Stand to Sit: Minimal Assistance - Patient > 75% Stand Pivot Transfers: Minimal Assistance - Patient > 75% Stand Pivot Transfer Details: Verbal cues for precautions/safety;Verbal cues for sequencing;Verbal cues for safe use of DME/AE Transfer (Assistive device): Hemi-walker Locomotion  Gait Ambulation: Yes Gait Assistance: Minimal Assistance - Patient > 75%;Moderate Assistance - Patient  50-74% Gait Distance (Feet): 25 Feet Assistive device: Hemi-walker Gait Assistance Details: Verbal cues for gait pattern;Verbal cues for precautions/safety;Verbal cues for safe use of DME/AE Gait Gait: Yes Gait Pattern: Impaired Gait Pattern: Step-to pattern;Wide base of support;Ataxic;Poor foot clearance - left Gait velocity: reduced Stairs / Additional Locomotion Stairs: Yes Stairs Assistance: Minimal Assistance -  Patient > 75%;Moderate Assistance - Patient 50 - 74% Stair Management Technique: One rail Left Number of Stairs: 12 Height of Stairs: 6 Ramp: Moderate Assistance - Patient 50 - 74% Curb: Moderate Assistance - Patient 50 - 74% Wheelchair Mobility Wheelchair Mobility: Yes Wheelchair Assistance: Supervision/Verbal cueing Wheelchair Propulsion: Right upper extremity;Both lower extermities Wheelchair Parts Management: Needs assistance Distance: >200'  Trunk/Postural Assessment  Cervical Assessment Cervical Assessment: Exceptions to [facility] (forward head) Thoracic Assessment Thoracic Assessment: Exceptions to [facility] (rounded shoulders) Lumbar Assessment Lumbar Assessment: Exceptions to [facility] (posterior pelvic tilt) Postural Control Postural Control: Deficits on evaluation Protective Responses: delayed d/t balance deficts and ataxia  Balance Balance Balance Assessed: Yes Static Sitting Balance Static Sitting - Balance Support: Feet supported Static Sitting - Level of Assistance: 5: Stand by assistance Dynamic Sitting Balance Dynamic Sitting - Balance Support: Feet supported;Right upper extremity supported Dynamic Sitting - Level of Assistance: 5: Stand by assistance Static Standing Balance Static Standing - Balance Support: Right upper extremity supported Static Standing - Level of Assistance: 4: Min assist Dynamic Standing Balance Dynamic Standing - Balance Support: Right upper extremity supported;During functional activity Dynamic Standing - Level of Assistance: 3: Mod  assist Dynamic Standing - Balance Activities: Reaching for objects Extremity Assessment  RUE Assessment RUE Assessment: Within Functional Limits LUE Assessment LUE Assessment: Exceptions to [facility] Active Range of Motion (AROM) Comments: limited ROM from dislocation and prior hx of rotator cuff issues General Strength Comments: WFL distal, 2-/5 proximal RLE Assessment RLE Assessment: Within Functional Limits General Strength Comments: grossly 4+/5 except hip flexion 4/5 LLE Assessment LLE Assessment: Within Functional Limits  Pt agreeable to PT session and reports he feels comfortable with discharge. Speciality power wheelchair evaluation occurred today and pt will receive loaner delivered to house on day of discharge from hospital. Discussed purpose of loaner chair and potential home modifications (ramp, assistive technology, and threshold ramps) with patient to prepare for discharge. PT assessed strength, ROM, flexibility, and posture for wheelchair LMN and discharge. PT also educated pt on how to perform pressure relief and frequency, pt agreeable. PT provided pt with resources for Vocational [facility]hab Program to ensure seamless transition to home environment. Pt with unrated back pain, provided repositioning for relief. Pt left semi-reclined in bed with all needs in reach and alarm on.    Truitt Leep Truitt Leep PT, DPT  01/09/2024, 1:34 PM

## 2024-01-09 NOTE — Progress Notes (Signed)
 Physical Therapy Session Note  Patient Details  Name: Dustin Sampson MRN: [id number] Date of Birth: 04-29-1966  Today's Date: 01/09/2024 PT Individual Time: 0900-0938 PT Individual Time Calculation (min): 38 min   Short Term Goals: Week 2:  PT Short Term Goal 1 (Week 2): STG=LTG d/t ELOS  Skilled Therapeutic Interventions/Progress Updates:      Therapy Documentation Precautions:  Precautions Precautions: Fall Shoulder Interventions: Shoulder sling/immobilizer Precaution/Restrictions Comments: ataxia Required Braces or Orthoses: Sling (Lt shoulder) Restrictions Weight Bearing Restrictions Per Provider Order: Yes LUE Weight Bearing Per Provider Order: Non weight bearing Other Position/Activity Restrictions: sling for 2 weeks until ortho follow-up  ATP and PT present for power wheelchair evaluation. PT provided recommendation for wheelchair speculations based on pt's range of motion, strength, tone, spasticity, trunk control, and postural control impairments. Pt will benefit from recommend power wheelchair with Group 3 chair with seat tilt, seat elevation, center mount power elevating leg rests, fully tray , and lateral pelvic thigh supports.   Pt without reports of pain and Supervision with PWC mobility from hospital room to main gym.    Therapy/Group: Individual Therapy  Truitt Leep Truitt Leep PT, DPT  01/09/2024, 12:37 PM

## 2024-01-09 NOTE — Progress Notes (Signed)
 Occupational Therapy Discharge Summary  Patient Details  Name: Dustin Sampson MRN: [id number] Date of Birth: 05/29/1966  Date of Discharge from OT service:January 09, 2024  Today's Date: 01/09/2024 OT Individual Time: 7829-5621 & 3086-5784 OT Individual Time Calculation (min): 45 min & 27 min    Patient has met 3 of 8 long term goals due to improved activity tolerance, improved balance, postural control, ability to compensate for deficits, improved awareness, and improved coordination. Patient to discharge at overall Min Assist-supervision level.  Patient's care partner is independent to provide the necessary physical assistance at discharge and received training on 3/16 for OT/PT.    Reasons goals not met: balance deficits from fluccuating ataxia   Recommendation:  Patient will benefit from ongoing skilled OT services in home health setting to continue to advance functional skills in the area of BADL and Reduce care partner burden.  Equipment: No equipment provided  Reasons for discharge: treatment goals met and discharge from hospital  Patient/family agrees with progress made and goals achieved: Yes  OT Discharge Precautions/Restrictions  Precautions Precautions: Fall Shoulder Interventions: Shoulder sling/immobilizer;For comfort (left) Precaution/Restrictions Comments: ataxia Restrictions Weight Bearing Restrictions Per Provider Order: Yes LUE Weight Bearing Per Provider Order: Touch down weight bearing Pain Pain Assessment Pain Scale: 0-10 Pain Score: 5  Pain Location: Rib cage Pain Orientation: Left Pain Descriptors / Indicators: Aching Pain Onset: On-going Pain Intervention(s): Medication (See eMAR) ADL ADL Eating: Set up Grooming: Modified independent Where Assessed-Grooming: Sitting at sink Upper Body Bathing: Supervision/safety Lower Body Bathing: Supervision/safety Where Assessed-Lower Body Bathing: Shower Upper Body Dressing: Minimal assistance Lower Body  Dressing: Minimal assistance Toileting: Minimal assistance Where Assessed-Toileting: Bedside Commode, Toilet Toilet Transfer: Minimal assistance Toilet Transfer Method: Stand pivot Acupuncturist: Grab bars Tub/Shower Transfer: Minimal assistance Web designer Method: Stand pivot Film/video editor: Minimal assistance Film/video editor Method: Warden/ranger: Emergency planning/management officer, Grab bars ADL Comments: Pt with increased ataxia in OT eval compared to earlier PT eval, could be from fatigue d/t pt reporting increased fatigue. Pt with shoulder immobilizing sling on LUE, OT ordered new one for bathing to keep LUE stabilized during bathing. Vision Baseline Vision/History: 1 Wears glasses Patient Visual Report: No change from baseline Vision Assessment?: No apparent visual deficits Perception  Perception: Within Functional Limits Praxis Praxis: WFL Cognition Cognition Overall Cognitive Status: Within Functional Limits for tasks assessed Arousal/Alertness: Awake/alert Orientation Level: Person;Place;Situation Person: Oriented Place: Oriented Situation: Oriented Memory: Appears intact Focused Attention: Appears intact Awareness: Appears intact Problem Solving: Appears intact Reasoning: Appears intact Safety/Judgment: Appears intact Brief Interview for Mental Status (BIMS) Repetition of Three Words (First Attempt): 3 Temporal Orientation: Year: Correct Temporal Orientation: Month: Accurate within 5 days Temporal Orientation: Day: Correct Recall: "Sock": Yes, no cue required Recall: "Blue": Yes, no cue required Recall: "Bed": Yes, no cue required BIMS Summary Score: 15 Sensation Sensation Light Touch: Appears Intact Hot/Cold: Appears Intact Proprioception: Appears Intact Stereognosis: Not tested Additional Comments: 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 Coordination Gross Motor Movements are Fluid and Coordinated: No Fine Motor  Movements are Fluid and Coordinated: No Coordination and Movement Description: ataxia at baseline Motor  Motor Motor: Ataxia Motor - Discharge Observations: ataxia at baseline Mobility  Bed Mobility Bed Mobility: Rolling Right;Right Sidelying to Sit;Supine to Sit;Sit to Supine Rolling Right: Supervision/verbal cueing Right Sidelying to Sit: Supervision/Verbal cueing Supine to Sit: Supervision/Verbal cueing Sit to Supine: Supervision/Verbal cueing Transfers Sit to Stand: Minimal Assistance - Patient > 75% Stand to Sit: Minimal Assistance -  Patient > 75%  Trunk/Postural Assessment  Cervical Assessment Cervical Assessment: Exceptions to [facility] (forward head) Thoracic Assessment Thoracic Assessment: Exceptions to [facility] (rounded shoulders) Lumbar Assessment Lumbar Assessment: Exceptions to [facility] (posterior pelvic tilt) Postural Control Postural Control: Deficits on evaluation Protective Responses: delayed d/t balance deficts and ataxia  Balance Balance Balance Assessed: Yes Static Sitting Balance Static Sitting - Balance Support: Feet supported Static Sitting - Level of Assistance: 5: Stand by assistance Dynamic Sitting Balance Dynamic Sitting - Balance Support: Feet supported;Right upper extremity supported Dynamic Sitting - Level of Assistance: 5: Stand by assistance Static Standing Balance Static Standing - Balance Support: Right upper extremity supported Static Standing - Level of Assistance: 4: Min assist Dynamic Standing Balance Dynamic Standing - Balance Support: Right upper extremity supported;During functional activity Dynamic Standing - Level of Assistance: 4: Min assist Dynamic Standing - Balance Activities: Reaching for objects Extremity/Trunk Assessment RUE Assessment RUE Assessment: Within Functional Limits LUE Assessment LUE Assessment: Exceptions to [facility] Active Range of Motion (AROM) Comments: limited ROM from dislocation and prior hx of rotator cuff issues General  Strength Comments: WFL distal, 2-/5 proximal  Session 1 General: "Good morning!" Pt supine in bed upon OT arrival, agreeable to OT session.  Pain: unrated pain reported in Left rib cage, activity, intermittent rest breaks, distractions provided for pain management, pt reports tolerable to proceed.   ADL: OT providing skilled intervention for ADL activities in order to prepare for D/C. OT donned ACE wrap and TED hose with total A then pt completing bed mobility supine>EOB and stand pivot from bed>PWC with levels of assist listed above. OT instructed pt to complete PWC mobility to turn around and park next to bed, ot able to complete with VC for efficiency and direction with SBA.    Pt seated in PWC at end of session with W/C alarm donned, call light within reach and 4Ps assessed.    Session 2 (W/C eval) General: "There she is!" Pt seated in W/C upon OT arrival, agreeable to OT.  Pain: see above  Other Treatments: Pt, Gaye Pollack PT and Huntley Dec OT present for custom W/C evaluation in order to increase functional mobility in order to complete ADLs and community integration. Pt will be receiving PWC with tilt function for pressure relief and increased independence with mobility and increased safety with mobility, see PT note for more information.    Pt seated in W/C at end of session with W/C alarm donned, call light within reach and 4Ps assessed.    Velia Meyer, OTD, OTR/L 01/09/2024, 4:31 PM

## 2024-01-09 NOTE — Progress Notes (Signed)
 Physical Therapy Session Note  Patient Details  Name: Dustin Sampson MRN: [id number] Date of Birth: [birth year]  Today's Date: 01/09/2024 PT Individual Time: 1030-1057 PT Individual Time Calculation (min): 27 min   Short Term Goals: Week 2:  PT Short Term Goal 1 (Week 2): STG=LTG d/t ELOS  Skilled Therapeutic Interventions/Progress Updates:       Pt sitting in PWC to start - has no reports of pain. Aware of DC plan and reports feeling excited about going home.   Pt propelled PWC at distant supervision level through his room, doorways, and CIR hallways >562ft.   Completed car transfer with car height simulating their [employer]. Educated patient on setup and w/c management to optimize safety and reduce caregiver burden during the transfer. Pt completed using the hemi-walker and minA for stability 2/2 ataxia. Able to manage BLE in/out without external assist. Needed cues for safety approach and to avoid grabbing doors to reduce his falls risk.   Pt returned to his room and he was left sitting upright in PWC - able to "3-point" turn in PWC to back into space with cues. All needs met at end.    Therapy Documentation Precautions:  Precautions Precautions: Fall Shoulder Interventions: Shoulder sling/immobilizer Precaution/Restrictions Comments: ataxia Required Braces or Orthoses: Sling (Lt shoulder) Restrictions Weight Bearing Restrictions Per Provider Order: Yes LUE Weight Bearing Per Provider Order: Non weight bearing Other Position/Activity Restrictions: sling for 2 weeks until ortho follow-up General:     Therapy/Group: Individual Therapy  Orrin Brigham 01/09/2024, 7:50 AM

## 2024-01-10 ENCOUNTER — Other Ambulatory Visit (HOSPITAL_COMMUNITY): Payer: Self-pay

## 2024-01-10 ENCOUNTER — Telehealth (HOSPITAL_COMMUNITY): Payer: Self-pay | Admitting: Pharmacy Technician

## 2024-01-10 LAB — GLUCOSE, CAPILLARY: Glucose-Capillary: 111 mg/dL — ABNORMAL HIGH (ref 70–99)

## 2024-01-10 MED ORDER — NALOXONE HCL 4 MG/0.1ML NA LIQD: NASAL | 0 refills | Status: AC

## 2024-01-10 MED ORDER — GUAIFENESIN-CODEINE 100-10 MG/5ML PO SOLN
10.0000 mL | ORAL | 0 refills | Status: DC | PRN
  Filled 2024-01-10: qty 237, 4d supply, fill #0

## 2024-01-10 MED ORDER — NALOXONE HCL 4 MG/0.1ML NA LIQD
NASAL | 0 refills | Status: DC
  Filled 2024-01-10: qty 2, 30d supply, fill #0

## 2024-01-10 NOTE — Telephone Encounter (Signed)
 Pharmacy Patient Advocate Encounter  Received notification from [facility] that Prior Authorization for Entresto 24-26MG  tablets  has been APPROVED from 01/10/2024 to 01/09/2025   PA #/Case ID/Reference #: [id number]

## 2024-01-10 NOTE — Progress Notes (Addendum)
 Inpatient Rehabilitation Admission Medication Review by a Pharmacist  A complete drug regimen review was completed for this patient to identify any potential clinically significant medication issues.  High Risk Drug Classes Is patient taking? Indication by Medication  Antipsychotic No   Anticoagulant Yes Apixaban - VTE prophylaxis  Antibiotic No   Opioid Yes Oxycodone: pain  Antiplatelet Yes Plavix : stroke ppx, PAD  Hypoglycemics/insulin No   Vasoactive Medication Yes Toprol, Entresto: HF   Chemotherapy No   Other Yes Tylenol, lidocaine patch: pain Uloric: gout Albuterol inhaler, Combivent: wheezing/SOB Ketoconazole cream: skin care Levothyroxine: hypothyroid Miralax, Senokot: constipation Temazepam: sleep Clonazepam: anxiety Melatonin: sleep Narcan - prn overdose Neo-synephrine nasal spray- for congestion Pantoprazole-heartburn Nicotine patch, gum: nicotine dependence Pravastatin, fenofibrate: HLD, PVD     Type of Medication Issue Identified Description of Issue Recommendation(s)  Drug Interaction(s) (clinically significant)     Duplicate Therapy     Allergy     No Medication Administration End Date     Incorrect Dose     Additional Drug Therapy Needed     Significant med changes from prior encounter (inform family/care partners about these prior to discharge).     Other  PTA medications: spironolactone, tadalafil,  pseudoephedrine,  gabapentin, jardiance, metformin, excedrin were discontinued.  Communicate to patient /family/ caregiver prior to discharge. It is noted to stop taking  these in the discharge AVS.      Clinically significant medication issues were identified that warrant physician communication and completion of prescribed/recommended actions by midnight of the next day:  No  Time spent performing this drug regimen review (minutes): 30  Thank you for allowing pharmacy to be a part of this patient's care.   Noah Delaine, RPh Clinical  Pharmacist  01/10/2024 8:55 AM   **Pharmacist phone directory can be found on [email or web address] listed under [facility]**

## 2024-01-10 NOTE — Progress Notes (Signed)
 PROGRESS NOTE   Subjective/Complaints:  Pt reports not much difference between inhalers and Nebs- but are helpful Coughed a lot yesterday, however cough meds with codeine most helpful- will need Rx for this when goes home.     ROS:   Pt denies SOB, abd pain, CP, N/V/C/D, and vision changes   (+) Cough- less wheezing- more dry Left sided rib pain- and anterior shoulder pain   Objective:   No results found.     Recent Labs    01/08/24 0515  WBC 8.3  HGB 11.1*  HCT 33.7*  PLT 277     Recent Labs    01/08/24 0515  NA 132*  K 4.4  CL 104  CO2 24  GLUCOSE 176*  BUN 33*  CREATININE 1.31*  CALCIUM 8.7*    Intake/Output Summary (Last 24 hours) at 01/10/2024 0817 Last data filed at 01/10/2024 0432 Gross per 24 hour  Intake 180 ml  Output 1400 ml  Net -1220 ml        Physical Exam: Vital Signs Blood pressure 104/71, pulse 77, temperature 98 F (36.7 C), resp. rate 18, height 5\' 9"  (1.753 m), weight 90.6 kg, SpO2 96%.     General: awake, alert, appropriate, just woke him up, slow to wake up; NAD HENT: conjugate gaze; oropharynx moist CV: regular rate and rhythm; no JVD Pulmonary: CTA B/L; no W/R/R- good air movement- one cough- dry/nonproductive GI: soft, NT, ND, (+)BS Psychiatric: appropriate Neurological: Ox3 Extremities; no LE edema  MSK- not wearing sling lately  TTP over anterior shoulder and L lateral chest today significant TTP over L anterior shoulder and L sided ribs- reproducible pain Skin: No evidence of breakdown, no evidence of rash MSK- bilateral shoulders symmetric , no subluxation no pain to palpation  Less dysarthric this AM-  Neurologic: Cranial nerves II through XII intact, motor strength is 5/5 in RIght  deltoid, bicep, tricep, grip,5/5 right and 4/5 left  hip flexor, knee extensors, ankle dorsiflexor and plantar flexor Left grip 4/5   Assessment/Plan: 1. Functional  deficits which require 3+ hours per day of interdisciplinary therapy in a comprehensive inpatient rehab setting. Physiatrist is providing close team supervision and 24 hour management of active medical problems listed below. Physiatrist and rehab team continue to assess barriers to discharge/monitor patient progress toward functional and medical goals  Care Tool:  Bathing              Bathing assist Assist Level: Supervision/Verbal cueing     Upper Body Dressing/Undressing Upper body dressing   What is the patient wearing?: Pull over shirt    Upper body assist Assist Level: Minimal Assistance - Patient > 75%    Lower Body Dressing/Undressing Lower body dressing            Lower body assist Assist for lower body dressing: Minimal Assistance - Patient > 75%     Toileting Toileting    Toileting assist Assist for toileting: Minimal Assistance - Patient > 75%     Transfers Chair/bed transfer  Transfers assist     Chair/bed transfer assist level: Minimal Assistance - Patient > 75%     Locomotion Ambulation   Ambulation  assist      Assist level: Minimal Assistance - Patient > 75% Assistive device: Walker-hemi Max distance: 350 (No longer applicable as pt unable to ambulate with platform walker therefore non-functional ambulator limited distances <30' with hemiwalker)   Walk 10 feet activity   Assist     Assist level: Minimal Assistance - Patient > 75% Assistive device: Walker-hemi   Walk 50 feet activity   Assist Walk 50 feet with 2 turns activity did not occur: Safety/medical concerns  Assist level: Minimal Assistance - Patient > 75% Assistive device: Walker-platform    Walk 150 feet activity   Assist Walk 150 feet activity did not occur: Safety/medical concerns  Assist level: Minimal Assistance - Patient > 75% Assistive device: Walker-platform    Walk 10 feet on uneven surface  activity   Assist     Assist level: Moderate  Assistance - Patient - 50 - 74% Assistive device: Walker-platform   Wheelchair     Assist Is the patient using a wheelchair?: Yes Type of Wheelchair: Power    Wheelchair assist level: Supervision/Verbal cueing Max wheelchair distance: 200+    Wheelchair 50 feet with 2 turns activity    Assist        Assist Level: Supervision/Verbal cueing   Wheelchair 150 feet activity     Assist      Assist Level: Supervision/Verbal cueing   Blood pressure 104/71, pulse 77, temperature 98 F (36.7 C), resp. rate 18, height 5\' 9"  (1.753 m), weight 90.6 kg, SpO2 96%.  Medical Problem List and Plan: 1. Functional deficits secondary to TBI             -patient may  shower             -ELOS/Goals: 14-18d Sup  D/c 3/19  Con't CIR PT, OT and SLP for swallowing  D/c today  W/c evaluation done yesterday 2.  Antithrombotics: -DVT/anticoagulation:  Mechanical: Sequential compression devices, below knee Bilateral lower extremities 3/11- will start Eliquis- 2.5 mg BID since doesn't want lovenox injections             -antiplatelet therapy: N/A--Plavix to be held for a week per Dr. Jordan Likes --Resume on 03/04  3. Pain Management:  On IV hydromorphone and Oxycodone prn.              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   3/4- has Percocet ordered BID as well as q4 hours prn- con't regimen and monitor- hasn't had this AM, so asked him to call for meds- last got 6 hours prior   3/5- changed percocet to 1-2 tabs q4 hours prn  3/7- pain doing ok- con't regimen  3/8- pain controlled- con't regimen  3/9- pain worse this Am- will add lidoderm patches- 3of them at night per pt request  3/13- changed pain meds back to 5-10 mg oxy q4 hours prn for pain and ocn't Percocet 7.5 mg BID- since having more shoulder/rib pain this AM  - although in L chest appears to be reproducible- no Sweating, SOB/DOE on it nor L jaw or L arm pain- just pain  associated with rib fx's and L shoulder pain from dislocation- could be from Platform walker- will ask therapy to wai ton platform walker today  3/14- pain has moved more to L lateral chest and anterior shoulder- which is c/w MSK pain- doing better with increased pain meds  3/17- pain doing better this AM  3/18- pain stable-  3/19- can go home on 5 days of pain meds- but will need to get from Ortho/PCP after that 4. Mood/Behavior/Sleep: LCSW to follow for evaluation and support.              -antipsychotic agents: N/A   5. Neuropsych/cognition: This patient is capable of making decisions on his own behalf. 6. Skin/Wound Care: Routine pressure measures 7. Fluids/Electrolytes/Nutrition: Monitor I/O. Check CMET in am 8.  Left shoulder dislocation: NWB with sling X 2 weeks. Follow up with ortho in 2 weeks.  Dr Ave Filter did L RTC repair several years ago 9.  Nasal Fracture: Continue Keflex thru 03/05-->follow up with ENT in a week 10.  Pneumonia- Left chest wall pain/SOB: Wean oxygen. Encourage pulmonary hygiene  3/4- wasn't wearing this AM  3/8- off O2 11. Chronic HFrEF: Compensated. Daily weight and strict I/O             --was on Jardiance, Spironolactone, Entresto- held due to renal function  3/11- Weight up 6.5 lbs in 1 day- will Restart Entresto, but not sure if weight his real weight- will recheck in AM  3/12- weight back down to 89 kg- con't regimen  3/13- Weight 90 kg today- will monitor trend- concerned about giving Lasix because Cr is already at 1.45 up from baseline of 1.1 BNP only mildly elevated, CXR without pleural effusion , no peripheral edema   3/17- Weight 91.2 kg today- up dramatically, not sure is correct- will monitor for trend  Weight down to 88.8 kg today- will monitor trend 12. Anxiety d/o: Continue Klonopin prn 13. Insomnia: On Restoril 30 mg/hs 14. Wheezing: Hx smoking plus not taking deep breaths due to left rib painFlutter valve qid for pulmonary hygiene. Will  schedule  duo nebs qid for now. 3/12- Con't nebs prn- since was switched from scheduled to prn by respiratory- asked pt to ask for and this AM, asked nursing to give.  3/13- added codeine cough meds for cough- nebs prn  3/14- coughing/wheezing more- will add Prednisone 20 mg daily x3 days and monitor- might need CXR, additional work up if doesn't improve- nebs stopped yesterday since cannot go home on it- but will change Combivent to QID scheduled CXR without evidence of PNA has emphysematous changes 3/17- getting Duonebs around the clock- is helping- steroids helped, some, but spiked his CBGs   3/18- will change Duonebs to Combivent QID as well as Albuterol inhaler- explained to do 2 puffs, which should be closer ot nebs 3/19- explained can take until he stops coughing, then can stop inhalers- demonstrated usage for pt this AM- needs Rx for codeine cough meds to go home with  15.  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 Thrush  3/4- will give Diflucan 200 mg x1 and then 100 mg daily x 6 days- might need to increase dose if doesn't improve.   3/5- will add Magic mouthwash with lidocaine- QID- for 7 days  3/7- Increased Diflucan to 200mg  yesterday- 15% better now- con't regimen  No oral lesions, overall better  17. Constipation  3/4- will give Sorbitol 30 mg x1 to get him to have BM after therapy- has been 3 days.   3/5- had good BM- con't regimen  3/7- will give Sorbitol since it's been 3 days  on Senna 2 tabs BID and Miralax- con't regimen otherwise  3/8- LBM 2 days ago- if no results by tomorrow, will give Sorbitol  3/9- BM x 2  3/12- LBM 3 days ago- per chart- pt wants sorbitol- will give after therapy today  3/16 last BM 3/15  3/17- LBM yesterday   18. AKI  3/4- will recheck labs Thursday since  with HF, Natale't want him to get too wet.   3/5- asked pt to drink a little more than currently drinking  3/6- Pt's BUN up to 33 and Cr up to 1.50- think it's because hurts to drink/eat, so will give some IVFs to tune back up- usually Cr <1 and is now 1.5- will give 75cc/hour starting after therapy and do for 15 hours- won't do more, since has HF.       Latest Ref Rng & Units 01/08/2024    5:15 AM 01/06/2024    5:22 AM 01/05/2024   12:30 PM  BMP  Glucose 70 - 99 mg/dL 595  638  756   BUN 6 - 20 mg/dL 33  35  34   Creatinine 0.61 - 1.24 mg/dL 4.33  2.95  1.88   Sodium 135 - 145 mmol/L 132  131  129   Potassium 3.5 - 5.1 mmol/L 4.4  4.7  5.2   Chloride 98 - 111 mmol/L 104  103  97   CO2 22 - 32 mmol/L 24  16  22    Calcium 8.9 - 10.3 mg/dL 8.7  9.1  9.3    Still above baseline of 1.1 but appears to be mainly hydration related , cont to push po fluids  3/11- will restart Entresto, but not Jardiance- still not drinking as much as should  3/13- BUN 29 and Cr 1.45- staying stable, not sure if this is new baseline, or not? 3/15 won't resume diuretics unles there is sign of fluid retention   3/17- Cr 1.33 looks much better- and BUN 33-   3/19- educated pt that his Cr was 1.1 at baseline, but with Entresto, as likely cause, his Cr is ~ 1.3- so needs ot f/u with PCP and Crads about this- cannot send home on Jardiance or Aldactone due to drop in BP and Renal issues/Hyperkalemia.  19. Wet cough  3/13- pt reports when uses nebs, coughs more for a few minutes, but taking Codeine cough meds- help ssome as well   3/17- coughing is getting better 20. Orthostatic hypotension vs dizziness from double vision  3/8- will try TEDs and abd binder and ACE wraps- if doesn't improve, will add Midodrine.   3/9- patch for 1 eye helping as well as TEDs/ACE wraps- not tried abd binder yet  3/13- per therapy, less dizziness- since walked 350 ft yesterday- will monitor Vitals:   01/09/24 2035 01/10/24 0447  BP: 111/78  104/71  Pulse: 99 77  Resp: 18 18  Temp: 97.8 F (36.6 C) 98 F (36.7 C)  SpO2: 98% 96%   Baseline BPs ok , no orthostatic changes per PT note on 3/9,   3/11- No more dizziness with TEDs.   3/19-still has ataxia and some dizziness, but no more lightheadedness Off  Aladactone because K+ is rising- esp in setting of elevated Cr- as well- monitor his labs as well as Cr/K+ and weight gain this weekend.  K+ 4.7 on 3/15  3/17- K+ 4.4    recommend power wheelchair with Group 3 chair with seat tilt, seat elevation, center mount power elevating  leg rests, fully tray , and lateral pelvic thigh supports.  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 He needs elevating leg rests due to changeable edema from [facility]; seat tilt and recline  for pressure relief - and has poor trunk support so needs lateral supports.    The patient is medically ready for discharge to home and will need follow-up with [facility] if he wants it- for ataxia, but will need to get pain meds for ribs and shoulder dislocation from Ortho/PCP. In addition, they will need to follow up with their PCP, Neurology, Orthopedics, and cardiology- Neuro for ataxia, Ortho for shoulder dislocation, and Cards for his dCHF.    I spent a total of 33   minutes on total care today- >50% coordination of care- due to prolonged time going over pt's w/c with therapy- as well as  going over with pt that might need codeine cough medicine and inhalers for cough- can stop Combivent and Albuterol after cough has improved.   LOS: 16 days A FACE TO FACE EVALUATION WAS PERFORMED  Carlina Derks 01/10/2024, 8:17 AM

## 2024-01-10 NOTE — Telephone Encounter (Signed)
 Pharmacy Patient Advocate Encounter   Received notification that prior authorization for Entresto 24-26MG  tablets is required/requested.   Insurance verification completed.   The patient is insured through [employer] [facility] .   Per test claim: PA required; PA submitted to above mentioned insurance via CoverMyMeds Key/confirmation #/EOC [id number] Status is pending

## 2024-01-11 ENCOUNTER — Other Ambulatory Visit (HOSPITAL_COMMUNITY): Payer: Self-pay

## 2024-01-15 ENCOUNTER — Other Ambulatory Visit (HOSPITAL_COMMUNITY): Payer: Self-pay

## 2024-01-16 ENCOUNTER — Other Ambulatory Visit: Payer: Self-pay | Admitting: Orthopedic Surgery

## 2024-01-16 DIAGNOSIS — M25512 Pain in left shoulder: Secondary | ICD-10-CM

## 2024-01-18 ENCOUNTER — Encounter: Payer: Self-pay | Admitting: Orthopedic Surgery

## 2024-01-18 ENCOUNTER — Ambulatory Visit (HOSPITAL_BASED_OUTPATIENT_CLINIC_OR_DEPARTMENT_OTHER): Payer: 59 | Admitting: Cardiology

## 2024-01-18 ENCOUNTER — Encounter (HOSPITAL_BASED_OUTPATIENT_CLINIC_OR_DEPARTMENT_OTHER): Payer: Self-pay | Admitting: Cardiology

## 2024-01-18 VITALS — BP 108/88 | HR 90 | Ht 69.0 in | Wt 201.0 lb

## 2024-01-18 DIAGNOSIS — I5042 Chronic combined systolic (congestive) and diastolic (congestive) heart failure: Secondary | ICD-10-CM | POA: Diagnosis not present

## 2024-01-18 DIAGNOSIS — I428 Other cardiomyopathies: Secondary | ICD-10-CM | POA: Insufficient documentation

## 2024-01-18 DIAGNOSIS — Z720 Tobacco use: Secondary | ICD-10-CM

## 2024-01-18 DIAGNOSIS — I2510 Atherosclerotic heart disease of native coronary artery without angina pectoris: Secondary | ICD-10-CM | POA: Diagnosis not present

## 2024-01-18 DIAGNOSIS — Z09 Encounter for follow-up examination after completed treatment for conditions other than malignant neoplasm: Secondary | ICD-10-CM

## 2024-01-18 DIAGNOSIS — I739 Peripheral vascular disease, unspecified: Secondary | ICD-10-CM

## 2024-01-18 DIAGNOSIS — Z8673 Personal history of transient ischemic attack (TIA), and cerebral infarction without residual deficits: Secondary | ICD-10-CM

## 2024-01-18 DIAGNOSIS — I493 Ventricular premature depolarization: Secondary | ICD-10-CM

## 2024-01-18 MED ORDER — EMPAGLIFLOZIN 10 MG PO TABS: 10.0000 mg | ORAL_TABLET | Freq: Every day | ORAL | 3 refills | Status: AC

## 2024-01-18 NOTE — Progress Notes (Signed)
 Cardiology Office Note:  .   Date:  01/18/2024  ID:  Dustin Sampson, DOB January 25, 1966, MRN [id number] PCP: Jarrett Soho, PA-C  [facility] Providers Cardiologist:  Jodelle Red, MD {  History of Present Illness: .   Dustin Sampson is a 58 y.o. male with a hx of hypertension, hyperlipidemia, type II diabetes, hypothyroidism, claudication, PAD, currently tobacco use, CVA by MRI, diffuse large B cell lymphoma,  MGUS who is seen for follow up today. I initially met him 08/11/20.   Cardiovascular risk factors: Prior clinical ASCVD: PAD. Prior silent CVA based on MRI Comorbid conditions: Endorses hypertension, hyperlipidemia, diabetes, chronic kidney disease Metabolic syndrome/Obesity: Highest adult weight was 238 lbs, working on weight loss now Tobacco use history: still smoking, working on weaning off. Peak 1 ppd Family history: father had 4-5 stents, no heart attack that he knows of. Mother has afib.   Today: Discharged 01/09/24 from inpatient rehab, admitted to hospital with subdural hematoma 12/19/23. Notes/hospital course reviewed. Had a mechanical fall resulting in shoulder injury, facial injury, and subdural hematoma.  He has slurred speech and weakness, now is in wheelchair full time.   Just prior to hospitalization, has cath and echo. Echo 20-25%. R/LHC reviewed, EDP 35, CI 2.6/CO 5.8. Cath showed 90% distal RCA with collateral. OM2 with 90% stenosis. Otherwise nonobstructive disease. Recommended medical therapy. CAD does not explain cardiomyopathy.  Has Jardiance and Sherryll Burger held for a time due to kidney function. Felt shortness of breath off of entresto entirely, but this was restarted at a low dose and his breathing is better. He has never had UTI. Follows with [facility], Dr. Signe Colt. Reviewed his recent labs, GFR back over 60. Will restart Jardiance and follow renal function.  Overall breathing feels stable. No edema.   Having trouble sleeping in general, but not  from breathing usually (sleeps on his side usually, feels a little short of breath laying on his back but sleeps on 2 smalls pillows long term). Hasn't had any shortness of breath like his did with his prior heart failure exacerbation. BNP was 1179 on presentation, most recent 213 about two weeks ago.  ROS: Denies chest pain, shortness of breath at rest or with normal exertion. No PND, orthopnea, LE edema or unexpected weight gain. No syncope or palpitations. ROS otherwise negative except as noted.   Studies Reviewed: Marland Kitchen    EKG:     not ordered today  Physical Exam:   VS:  BP 108/88   Pulse 90   Ht 5\' 9"  (1.753 m)   Wt 201 lb (91.2 kg)   SpO2 98%   BMI 29.68 kg/m    Wt Readings from Last 3 Encounters:  01/18/24 201 lb (91.2 kg)  01/10/24 199 lb 11.8 oz (90.6 kg)  12/19/23 196 lb 3.4 oz (89 kg)    GEN: Well nourished, well developed in no acute distress HEENT: Normal, moist mucous membranes NECK: No JVD CARDIAC: regular rhythm, normal S1 and S2, no rubs or gallops. No murmur. VASCULAR: Radial and DP pulses 2+ bilaterally. No carotid bruits RESPIRATORY:  Clear to auscultation without rales, wheezing or rhonchi  ABDOMEN: Soft, non-tender, non-distended MUSCULOSKELETAL:  Ambulates independently SKIN: Warm and dry, no edema NEUROLOGIC:  Alert and oriented x 3. No focal neuro deficits noted. PSYCHIATRIC:  Normal affect    ASSESSMENT AND PLAN: .    Extensive recent hospitalization for fall/subdural hematoma and complications -hospitalization reviewed at length today  Cardiomyopathy, likely nonischemic (CAD does not explain severity of cardiomyopathy) -  Reviewed echo, cath. EF down to 20-25% -CAD does not explain severity of cardiomyopathy. Two areas of severe stenosis, but in distal/small vessels, with collaterals to RCA -BP and renal function limit GDMT.  -now back on low dose entresto, tolerating, renal function improved from AKI in hospital -will restart Jardiance, recheck renal  function in two weeks -continue metoprolol succinate 50 mg daily -spironolactone held in hospital due to AKI/hypotension.  -will titrate as BP/kidney function allows. He is due for follow up with Dr. Signe Colt at [facility] as well -with his recent hospitalization and changes to meds, would ideally like on stable GDMT for 3 mos, then recheck echo. If EF remains low, would need to discuss ICD -he appears euvolemic on exam today, BNP improved in the hospital. He is not on standing diuretic and need to be cautious with renal function. Ideally restarting Jardiance will help maintain volume status. He will contact me with any change in symptoms  Frequent PVCs -minimally bothersome on current dose of metoprolol -no syncope or sustained symptoms   Aortic atherosclerosis Claudication, with moderate PAD in lower extremities by ABI CAD, small vessel Prior CVA by MRI Hypertension Type II diabetes with hyperlipidemia Ongoing tobacco use -continue clopidogrel 75 mg, atorvastatin 80 mg. Was on DAPT but this was changed to monotherapy after subdural hematoma -was changed from atorvastatin to pravastatin, unclear why. LDL goal <55 -lp(a) elevated at 158 -lipids 10/2023 with Tchol 146, HDL 24, LDL 62, TG 301. On Fenofibrate for TG -would recheck once back to baseline, may need to intensify statin -counseled on role of smoking and counseled to quit completely -discussed red flag warning signs that need immediate medical attention   Tobacco abuse counseling: he is working to quit, down to 1-2 cigarettes per day, using gum  Jodelle Red, MD, PhD, [facility]  [facility]    Dispo: 6-8 weeks or sooner as needed  Total time of encounter: I spent 64 minutes dedicated to the care of this patient on the date of this encounter to include pre-visit review of records, face-to-face time with the patient discussing conditions above, and clinical documentation with the electronic health record.  We specifically spent time today discussing his extensive recent hospitalization, including med rec, reviewing his echo/cath, discussing symptoms and next steps.   Signed, Jodelle Red, MD   Jodelle Red, MD, PhD, [facility] [place]  [facility]  [place]  [facility] at [facility] [address] [place], [place] [address] [phone]

## 2024-01-18 NOTE — Patient Instructions (Addendum)
 Medication Instructions:  Your physician has recommended you make the following change in your medication:  START Jardiance  *If you need a refill on your cardiac medications before your next appointment, please call your pharmacy*   Lab Work: Your physician recommends that you return for lab work in 2 weeks: BMP  If you have labs (blood work) drawn today and your tests are completely normal, you will receive your results only by: MyChart Message (if you have MyChart) OR A paper copy in the mail If you have any lab test that is abnormal or we need to change your treatment, we will call you to review the results.   Follow-Up: At [facility], you and your health needs are our priority.  As part of our continuing mission to provide you with exceptional heart care, we have created designated Provider Care Teams.  These Care Teams include your primary Cardiologist (physician) and Advanced Practice Providers (APPs -  Physician Assistants and Nurse Practitioners) who all work together to provide you with the care you need, when you need it.  We recommend signing up for the patient portal called "MyChart".  Sign up information is provided on this After Visit Summary.  MyChart is used to connect with patients for Virtual Visits (Telemedicine).  Patients are able to view lab/test results, encounter notes, upcoming appointments, etc.  Non-urgent messages can be sent to your provider as well.   To learn more about what you can do with MyChart, go to [email or web address].    Your next appointment:   May 5th @ 10:55 am  Provider:   Gillian Shields, NP  Other Instructions Restart Jardiance 10 mg daily Get blood work in about 2 weeks to recheck kidney function with start of this Reach out to Dr. Signe Colt at [facility] for follow up/reschedule

## 2024-01-19 ENCOUNTER — Other Ambulatory Visit (HOSPITAL_COMMUNITY): Payer: Self-pay

## 2024-01-20 ENCOUNTER — Encounter: Payer: Self-pay | Admitting: Orthopedic Surgery

## 2024-01-22 ENCOUNTER — Encounter: Payer: Self-pay | Admitting: Orthopedic Surgery

## 2024-01-23 ENCOUNTER — Ambulatory Visit
Admission: RE | Admit: 2024-01-23 | Discharge: 2024-01-23 | Disposition: A | Discharge status: Home / Self Care | Source: Ambulatory Visit | Attending: Orthopedic Surgery | Admitting: Orthopedic Surgery

## 2024-01-23 DIAGNOSIS — M25512 Pain in left shoulder: Secondary | ICD-10-CM

## 2024-02-02 ENCOUNTER — Ambulatory Visit (HOSPITAL_COMMUNITY)
Admission: RE | Admit: 2024-02-02 | Discharge: 2024-02-02 | Disposition: A | Discharge status: Home / Self Care | Source: Ambulatory Visit | Attending: Cardiology | Admitting: Cardiology

## 2024-02-02 DIAGNOSIS — I739 Peripheral vascular disease, unspecified: Secondary | ICD-10-CM

## 2024-02-04 LAB — VAS US ABI WITH/WO TBI
Left ABI: 0.7
Right ABI: 1.13

## 2024-02-12 ENCOUNTER — Telehealth (HOSPITAL_BASED_OUTPATIENT_CLINIC_OR_DEPARTMENT_OTHER): Payer: Self-pay | Admitting: Cardiology

## 2024-02-12 ENCOUNTER — Telehealth (HOSPITAL_BASED_OUTPATIENT_CLINIC_OR_DEPARTMENT_OTHER): Payer: Self-pay

## 2024-02-12 MED ORDER — SACUBITRIL-VALSARTAN 24-26 MG PO TABS: 1.0000 | ORAL_TABLET | Freq: Two times a day (BID) | ORAL | 3 refills | Status: DC

## 2024-02-12 NOTE — Telephone Encounter (Signed)
*  STAT* If patient is at the pharmacy, call can be transferred to refill team.   1. Which medications need to be refilled? (please list name of each medication and dose if known)   sacubitril -valsartan  (ENTRESTO ) 24-26 MG     2. Would you like to learn more about the convenience, safety, & potential cost savings by using the [facility]? N/A   3. Are you open to using the [facility] (Type [facility]. N/A   4. Which pharmacy/location (including street and city if local pharmacy) is medication to be sent to?  [facility] [id number] - [place], [place] - [address]     5. Do they need a 30 day or 90 day supply? 90 day  Only has one tablet left.

## 2024-02-12 NOTE — Telephone Encounter (Signed)
 Pt c/o medication issue:  1. Name of Medication: sacubitril -valsartan  (ENTRESTO ) 24-26 MG   2. How are you currently taking this medication (dosage and times per day)? Has not taken today due to only having one tablet and waiting to see if medication will be at pharmacy today before taking now or tonight  3. Are you having a reaction (difficulty breathing--STAT)? No  4. What is your medication issue? Patient states he believes Dr. Veryl Gottron had given him a coupon for the Entresto  in addition to the coupon for repatha, but he cannot find it. He is requesting another if possible for him to pickup. Please advise.

## 2024-02-12 NOTE — Telephone Encounter (Signed)
 Duplicate please disregard

## 2024-02-12 NOTE — Telephone Encounter (Signed)
 Called and spoke to pt. DOB and last name verified. Called to follow up on Entresto . PA likely needed per last refill. Pt made aware; will message pt when PA is approved or denied. Entresto  coupons left at front desk, ready for pickup. Pt verbalized understanding.

## 2024-02-14 LAB — BASIC METABOLIC PANEL WITH GFR
BUN/Creatinine Ratio: 14 (ref 9–20)
BUN: 20 mg/dL (ref 6–24)
CO2: 20 mmol/L (ref 20–29)
Calcium: 9.6 mg/dL (ref 8.7–10.2)
Chloride: 102 mmol/L (ref 96–106)
Creatinine, Ser: 1.38 mg/dL — ABNORMAL HIGH (ref 0.76–1.27)
Glucose: 121 mg/dL — ABNORMAL HIGH (ref 70–99)
Potassium: 5 mmol/L (ref 3.5–5.2)
Sodium: 140 mmol/L (ref 134–144)
eGFR: 59 mL/min/{1.73_m2} — ABNORMAL LOW (ref >59)

## 2024-02-20 ENCOUNTER — Encounter: Payer: Self-pay | Admitting: Podiatry

## 2024-02-20 ENCOUNTER — Ambulatory Visit (INDEPENDENT_AMBULATORY_CARE_PROVIDER_SITE_OTHER): Admitting: Podiatry

## 2024-02-20 DIAGNOSIS — B351 Tinea unguium: Secondary | ICD-10-CM

## 2024-02-20 DIAGNOSIS — I739 Peripheral vascular disease, unspecified: Secondary | ICD-10-CM | POA: Diagnosis not present

## 2024-02-20 DIAGNOSIS — E1142 Type 2 diabetes mellitus with diabetic polyneuropathy: Secondary | ICD-10-CM

## 2024-02-20 DIAGNOSIS — M79675 Pain in left toe(s): Secondary | ICD-10-CM | POA: Diagnosis not present

## 2024-02-20 DIAGNOSIS — M79674 Pain in right toe(s): Secondary | ICD-10-CM | POA: Diagnosis not present

## 2024-02-20 NOTE — Progress Notes (Signed)
  Subjective:  Patient ID: Dustin Sampson, male    DOB: 19-Jul-1966,   MRN: [id number]  No chief complaint on file.   58 y.o. male presents for concern of thickened elongated and painful nails that are difficult to trim. Requesting to have them trimmed today. Relates burning and tingling in their feet. Patient is diabetic and last A1c was  Lab Results  Component Value Date   HGBA1C 6.0 (H) 11/14/2023   .   PCP:  Darnelle Elders, PA-C    . Denies any other pedal complaints. Denies n/v/f/c.   Past Medical History:  Diagnosis Date   Anxiety    Arthritis    Diabetes mellitus without complication (HCC)    Gout    Hyperlipemia    Hypertension    Hypothyroidism    Insomnia    Pneumonia    Stroke (HCC)    Thyroid  disease     Objective:  Physical Exam: Vascular: DP/PT pulses 2/4 on right and non palpable on left  CFT <5seconds. Absent hair growth on digits. Edema noted to bilateral lower extremities. Xerosis noted bilaterally.  Skin. No lacerations or abrasions bilateral feet. Nails 1-5 bilateral  are thickened discolored and elongated with subungual debris.  Musculoskeletal: MMT 5/5 bilateral lower extremities in DF, PF, Inversion and Eversion. Deceased ROM in DF of ankle joint.  Neurological: Sensation intact to light touch. Protective sensation diminished bilateral.    Assessment:   1. Pain due to onychomycosis of toenails of both feet   2. Type 2 diabetes mellitus with peripheral neuropathy (HCC)   3. PAD (peripheral artery disease) (HCC)      Plan:  Patient was evaluated and treated and all questions answered. -Discussed and educated patient on diabetic foot care, especially with  regards to the vascular, neurological and musculoskeletal systems.  -Stressed the importance of good glycemic control and the detriment of not  controlling glucose levels in relation to the foot. -Discussed supportive shoes at all times and checking feet regularly.  -Mechanically debrided all  nails 1-5 bilateral using sterile nail nipper and filed with dremel without incident  -Answered all patient questions -Patient to return  in 3 months for at risk foot care -Patient advised to call the office if any problems or questions arise in the meantime.   Jennefer Moats, DPM

## 2024-02-26 ENCOUNTER — Encounter (HOSPITAL_BASED_OUTPATIENT_CLINIC_OR_DEPARTMENT_OTHER): Payer: Self-pay | Admitting: Family

## 2024-02-26 ENCOUNTER — Ambulatory Visit (INDEPENDENT_AMBULATORY_CARE_PROVIDER_SITE_OTHER): Admitting: Family

## 2024-02-26 VITALS — BP 118/62 | HR 84 | Ht 73.0 in

## 2024-02-26 DIAGNOSIS — I5022 Chronic systolic (congestive) heart failure: Secondary | ICD-10-CM | POA: Diagnosis not present

## 2024-02-26 DIAGNOSIS — I739 Peripheral vascular disease, unspecified: Secondary | ICD-10-CM | POA: Diagnosis not present

## 2024-02-26 DIAGNOSIS — I10 Essential (primary) hypertension: Secondary | ICD-10-CM | POA: Diagnosis not present

## 2024-02-26 DIAGNOSIS — E785 Hyperlipidemia, unspecified: Secondary | ICD-10-CM

## 2024-02-26 NOTE — Progress Notes (Signed)
 Cardiology Office Note:  .   Date:  02/26/2024  ID:  Dustin Sampson, DOB [birth year], MRN [id number] PCP: Dustin Elders, PA-C  Nephrology: Dr. Glean [facility] Providers Cardiologist:  Dustin Donning, MD    History of Present Illness: .   Dustin Sampson is a 58 y.o. male with history of hypertension, hyperlipidemia, DM2, hypothyroidism, PAD s/p bilateral SFA stenting 08/2020, tobacco use, prior CVA, MGUS, PVC, HFrEF, CKD 2A, CAD, large B-cell lymphoma.  Previously abnormal nuclear stress test 2021 with inferior and septal perfusion defect.  LHC deferred by  Mr. Ohanlon at that time. Echo LVEF 45% at that time which reportedly normalized 06/2022. Prior monitor 2023 revealed 10% PVC burden.  Admitted 1/21 - 11/17/2023 with heart failure exacerbation.  Echo LVEF 20 to 25%.  LHC revealed thrombotic occlusion of RCA which appeared be chronic and ulcerated focal stenosis of OM1.  Recommended for medical therapy of symptoms and severe LV dysfunction did not correlate with underlying CAD.  Discharged on Jardiance , Entresto , metoprolol , spironolactone .  It was not taking levothyroxine  appropriately and TSH elevated, recommended to continue present dose and repeat labs in a few weeks to consider dose adjustment.  At clinic visit 11/27/23 he had myriad of concerned including frequent falls (reported 25x since hospital discharge) and recommended for ED evaluation due to hitting his head 3x with no concerning findings.   Subsequently admitted 2/25 - 12/25/23 after mechanical fall resulting in shoulder and facial injury as well as subdural hematoma and was discharged to inpatient rehab having since returned home 01/09/2024.  Seen by Dr. Veryl Sampson 01/18/2024.  He was in wheelchair full-time.  Resuming low-dose Entresto  had improved his breathing.  As his GFR > 60 Jardiance  was resumed.  Presents today for follow-up with family member.  Notes intermittent nausea since last seen.  He self discontinued Entresto   as well as Jardiance  for a few days.  Discussed that these medications typically do not cause nausea.  Notes he did not notice any improvement in his symptoms while not taking so has since resumed.  Declines to weigh today but reports weights at recent clinic visits have been stable.  He is not weighing at home due to concerns regarding mobility and falls in the setting of his ataxia.  He does note urinary frequency but also has been drinking Gatorade to help with his nausea.  Encouraged to discontinue Gatorade and drink water limiting to 64 ounces of fluid per day.  Reports no dyspnea, edema, orthopnea, PND.  He notes bilateral foot pain predominantly at night and has tried to limit his gabapentin  to reduce risk of falls.  ROS:  Please see the history of present illness.    All other systems reviewed and are negative.    Studies Reviewed: .           Risk Assessment/Calculations:             Physical Exam:   VS:  BP 118/62 (BP Location: Right Arm, Patient Position: Sitting, Cuff Size: Normal)   Pulse 84   Ht 6\' 1"  (1.854 m)   BMI 26.52 kg/m    Wt Readings from Last 3 Encounters:  01/18/24 201 lb (91.2 kg)  01/10/24 199 lb 11.8 oz (90.6 kg)  12/19/23 196 lb 3.4 oz (89 kg)    GEN: no acute distress, well developed  NECK: No JVD; No carotid bruits CARDIAC: RRR, no murmurs, rubs, gallops RESPIRATORY:  Clear to auscultation without rales, wheezing. ABDOMEN: Soft, non-tender, non-distended EXTREMITIES:  No  edema; No deformity   ASSESSMENT AND PLAN: .    CAD/HLD, LDL goal less than 55/PVD/Aortic atherosclerosis- CAD out of proportion to his HF. GDMT includes Jardiance  10 mg daily, pravastatin  80 mg daily, Plavix  5 mg daily, Metoprolol  succinate 50 mg daily.  Atorvastatin  transition to pravastatin  during most recent hospitalization, unclear why.  Update LFTs and direct LDL today..   HFrEF - LHC 10/2023 does not explain severity of cardiomyopathy. GDMT limited by renal function.  Grossly  euvolemic on exam.  GDMT includes Jardiance  10 mg daily, Entresto  24-26 mg twice daily, Toprol  50 mg daily.  Will not add MRA due to relative hypotension.   Plan for repeat echo end of June, early July as he will have completed 3 months of GDMT.   If LVEF remains less than 35% will need referral to EP to discuss ICD.  Low sodium diet, fluid restriction <2L, and daily weights encouraged. Educated to contact our office for weight gain of 2 lbs overnight or 5 lbs in one week.   PAD -reports pain in bilateral feet during sleep.  Physical activity limited by his ataxia, predominantly sits in wheelchair.  ABI 02/02/24 right ABI normal, left ABI 0.7 as compared to 1.09 previously.  Will route to [facility] for recommendations as follow up not until 04/2024  CKD - Careful titration of diuretic and antihypertensive.  Follows with Dr. Christianne Cowper of nephrology.  History of CVA- Continue Pravastatin  80mg  daily, Plavix  75mg  daily.   PVC- Continue Metoprolol . Denies palpitations.   DM2- Continue to follow with PCP.         Dispo: follow up after echo [facility] Aly or August 2025) with Dr. Veryl Sampson or APP  Signed, Clearnce Curia, NP

## 2024-02-26 NOTE — Patient Instructions (Signed)
 Medication Instructions:  Your physician recommends that you continue on your current medications as directed. Please refer to the Current Medication list given to you today.  *If you need a refill on your cardiac medications before your next appointment, please call your pharmacy*  Lab Work: LDL level & HFP today  If you have labs (blood work) drawn today and your tests are completely normal, you will receive your results only by: MyChart Message (if you have MyChart) OR A paper copy in the mail If you have any lab test that is abnormal or we need to change your treatment, we will call you to review the results.  Testing/Procedures: Your physician has requested that you have an echocardiogram June or July. Echocardiography is a painless test that uses sound waves to create images of your heart. It provides your doctor with information about the size and shape of your heart and how well your heart's chambers and valves are working. This procedure takes approximately one hour. There are no restrictions for this procedure. Please do NOT wear cologne, perfume, aftershave, or lotions (deodorant is allowed). Please arrive 15 minutes prior to your appointment time.  Please note: We ask at that you not bring children with you during ultrasound (echo/ vascular) testing. Due to room size and safety concerns, children are not allowed in the ultrasound rooms during exams. Our front office staff cannot provide observation of children in our lobby area while testing is being conducted. An adult accompanying a patient to their appointment will only be allowed in the ultrasound room at the discretion of the ultrasound technician under special circumstances. We apologize for any inconvenience.   Follow-Up: At [facility], you and your health needs are our priority.  As part of our continuing mission to provide you with exceptional heart care, our providers are all part of one team.  This team  includes your primary Cardiologist (physician) and Advanced Practice Providers or APPs (Physician Assistants and Nurse Practitioners) who all work together to provide you with the care you need, when you need it.  Your next appointment:    July 2025   Provider:   Sheryle Donning, MD, Slater Duncan, NP, or Neomi Banks, NP    We recommend signing up for the patient portal called "MyChart".  Sign up information is provided on this After Visit Summary.  MyChart is used to connect with patients for Virtual Visits (Telemedicine).  Patients are able to view lab/test results, encounter notes, upcoming appointments, etc.  Non-urgent messages can be sent to your provider as well.   To learn more about what you can do with MyChart, go to [email or web address].   Other Instructions   Recommend taking your medications with food to help prevent nausea.

## 2024-02-27 ENCOUNTER — Encounter (HOSPITAL_BASED_OUTPATIENT_CLINIC_OR_DEPARTMENT_OTHER): Payer: Self-pay

## 2024-02-27 DIAGNOSIS — E785 Hyperlipidemia, unspecified: Secondary | ICD-10-CM

## 2024-02-27 LAB — HEPATIC FUNCTION PANEL
ALT: 11 IU/L (ref 0–44)
AST: 13 IU/L (ref 0–40)
Albumin: 4.3 g/dL (ref 3.8–4.9)
Alkaline Phosphatase: 73 IU/L (ref 44–121)
Bilirubin Total: <0.2 mg/dL (ref 0.0–1.2)
Bilirubin, Direct: <0.08 mg/dL (ref 0.00–0.40)
Total Protein: 7 g/dL (ref 6.0–8.5)

## 2024-02-27 LAB — LDL CHOLESTEROL, DIRECT: LDL Direct: 115 mg/dL — ABNORMAL HIGH (ref 0–99)

## 2024-02-27 MED ORDER — ROSUVASTATIN CALCIUM 40 MG PO TABS
40.0000 mg | ORAL_TABLET | Freq: Every day | ORAL | 3 refills | Status: DC
Start: 2024-02-27 — End: 2024-05-27

## 2024-02-27 NOTE — Addendum Note (Signed)
 Addended by: Guss Legacy on: 02/27/2024 08:36 AM   Modules accepted: Orders

## 2024-02-28 ENCOUNTER — Other Ambulatory Visit: Payer: Self-pay

## 2024-02-28 DIAGNOSIS — E785 Hyperlipidemia, unspecified: Secondary | ICD-10-CM

## 2024-02-28 MED ORDER — ROSUVASTATIN CALCIUM 40 MG PO TABS: 40.0000 mg | ORAL_TABLET | Freq: Every day | ORAL | 3 refills | Status: DC

## 2024-03-01 ENCOUNTER — Ambulatory Visit: Admitting: Podiatrist

## 2024-03-11 ENCOUNTER — Ambulatory Visit (INDEPENDENT_AMBULATORY_CARE_PROVIDER_SITE_OTHER): Admitting: Podiatrist

## 2024-03-11 DIAGNOSIS — L03032 Cellulitis of left toe: Secondary | ICD-10-CM | POA: Diagnosis not present

## 2024-03-11 DIAGNOSIS — L600 Ingrowing nail: Secondary | ICD-10-CM

## 2024-03-11 MED ORDER — DOXYCYCLINE HYCLATE 100 MG PO TABS: 100.0000 mg | ORAL_TABLET | Freq: Two times a day (BID) | ORAL | 0 refills | Status: DC

## 2024-03-11 MED ORDER — MUPIROCIN 2 % EX OINT: 1.0000 | TOPICAL_OINTMENT | Freq: Two times a day (BID) | CUTANEOUS | 2 refills | Status: DC

## 2024-03-11 NOTE — Patient Instructions (Signed)
 Soak Instructions   EPSOM SALT SOAKS  Place 1/4 cup of epsom salts in a quart of warm tap water. continue to soak in the solution for 20 minutes.  .  Next, remove your foot or feet from solution, blot dry the affected area and cover.  Apply mupirocin  ointment (RX), You may use a band aid large enough to cover the area or use gauze and tape.      IF YOUR SKIN BECOMES IRRITATED WHILE USING THESE INSTRUCTIONS, IT IS OKAY TO SWITCH TO  antibacterial soap pump soap (Dial)  and water to keep the toe clean instead of soaking in epsom salts.

## 2024-03-11 NOTE — Progress Notes (Unsigned)
 Chief Complaint  Patient presents with   Toe Pain    Pt presents for pain of the tip of his left hallux states that it's very sensitive and hurts to the touch.     HPI: Patient is 58 y.o. male who presents today for pain in the left hallux nail at the tip of the nail as well as the medial and  lateral border. Relates the area is painful to the touch and with shoes.    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  Denies nausea/ vomiting/ fevers/ chills or night sweats.   Denies difficulty breathing, denies calf pain or tenderness  Physical Exam  Patient is awake, alert, and oriented x 3.  In no acute distress.    Vascular status is intact with palpable pedal pulses DP and PT bilateral and capillary refill time less than 3 seconds bilateral.  No edema or erythema noted.   Neurological exam reveals epicritic and protective sensation grossly intact bilateral.   Dermatological exam reveals skin is supple and dry to bilateral feet. Pain on palpation medial and lateral border of the left hallux is noted.  Incurvation present.    Musculoskeletal exam: Musculature intact with dorsiflexion, plantarflexion, inversion, eversion. Ankle and First MPJ joint range of motion normal.    Assessment:   ICD-10-CM   1. Paronychia of toe of left foot due to  ingrown toenail  L03.032    L60.0        Plan: Exam findings and treatment recommendations are discussed.  Discussed permanent nail procedure vs conservative approach of trimming the edges back.  At todays visit he would like to try the conservative option.  A slantback procedure of the nail borders performed with no anesthesia.  recommended epsome salt soaks soaks. And doxycyclne was written.  Recommended he follow up in 1 week if this fails to resolve his pain.

## 2024-03-12 MED ORDER — ROSUVASTATIN CALCIUM 20 MG PO TABS
20.0000 mg | ORAL_TABLET | Freq: Every day | ORAL | 3 refills | Status: AC
Start: 2024-03-12 — End: 2025-06-18

## 2024-03-12 NOTE — Addendum Note (Signed)
 Addended by: Guss Legacy on: 03/12/2024 08:04 AM   Modules accepted: Orders

## 2024-03-13 ENCOUNTER — Telehealth: Payer: Self-pay

## 2024-03-13 NOTE — Telephone Encounter (Signed)
 Patient called and was discharged from the hospital in March. At the time of discharged he was taken off Lexapro  and he would like to know why. He states his psychiatrist is questioning why.

## 2024-03-22 NOTE — Telephone Encounter (Signed)
 Called no answer

## 2024-04-20 ENCOUNTER — Other Ambulatory Visit (HOSPITAL_BASED_OUTPATIENT_CLINIC_OR_DEPARTMENT_OTHER): Payer: Self-pay | Admitting: Cardiology

## 2024-04-20 DIAGNOSIS — I255 Ischemic cardiomyopathy: Secondary | ICD-10-CM

## 2024-04-29 ENCOUNTER — Ambulatory Visit (HOSPITAL_BASED_OUTPATIENT_CLINIC_OR_DEPARTMENT_OTHER): Payer: Self-pay | Admitting: Family

## 2024-04-29 ENCOUNTER — Ambulatory Visit (HOSPITAL_BASED_OUTPATIENT_CLINIC_OR_DEPARTMENT_OTHER)

## 2024-04-29 DIAGNOSIS — I5022 Chronic systolic (congestive) heart failure: Secondary | ICD-10-CM

## 2024-04-29 LAB — ECHOCARDIOGRAM COMPLETE: Area-P 1/2: 5.66 cm2

## 2024-05-02 ENCOUNTER — Other Ambulatory Visit: Payer: Self-pay | Admitting: *Deleted

## 2024-05-02 DIAGNOSIS — I739 Peripheral vascular disease, unspecified: Secondary | ICD-10-CM

## 2024-05-13 ENCOUNTER — Ambulatory Visit: Attending: Surgery | Admitting: Physician Assistant

## 2024-05-13 ENCOUNTER — Ambulatory Visit (HOSPITAL_COMMUNITY)
Admission: RE | Admit: 2024-05-13 | Discharge: 2024-05-13 | Disposition: A | Discharge status: Home / Self Care | Source: Ambulatory Visit | Attending: Surgery | Admitting: Surgery

## 2024-05-13 ENCOUNTER — Ambulatory Visit (HOSPITAL_BASED_OUTPATIENT_CLINIC_OR_DEPARTMENT_OTHER)
Admission: RE | Admit: 2024-05-13 | Discharge: 2024-05-13 | Disposition: A | Discharge status: Home / Self Care | Source: Ambulatory Visit | Attending: Surgery | Admitting: Surgery

## 2024-05-13 VITALS — BP 114/77 | HR 101 | Temp 98.0°F | Wt 200.0 lb

## 2024-05-13 DIAGNOSIS — I739 Peripheral vascular disease, unspecified: Secondary | ICD-10-CM

## 2024-05-13 LAB — VAS US ABI WITH/WO TBI
Left ABI: 0.58
Right ABI: 0.99

## 2024-05-13 NOTE — Progress Notes (Signed)
 HISTORY AND PHYSICAL     CC:  follow up. Requesting Provider:  Katina Pfeiffer, PA-C  HPI: This is a 58 y.o. male who is here today for follow up for PAD.  Pt has hx of  stenting of left SFA on 08/21/2020 by Dr. Harvey.  He also had right SFA stenting on 09/04/2020.  This was complicated by hematoma in his left groin requiring primary repair by Dr. Eliza.   Pt was last seen 05/04/2023 and at that time, he was not having any claudication, rest pain or non healing wounds.   The pt returns today for follow up and here with his wife.  He states that he has burning pain in his feet at night.  Both feet are equal and one is not worse than the other.  He does not have any non healing wounds.  He did have an issue with the left great toe requiring podiatry to intervene and this has healed.  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  He does not ambulate long enough to elicit claudication.  He is compliant with his statin and plavix .   He states that his heart function is now down to 20%.  He was seen by Podiatry on 03/11/2024 for pain in the tip of the left hallux.  He has hx of DM with peripheral neuropathy  The pt is on a statin for cholesterol management.    The pt is not on an aspirin .    Other AC:  Plavix  The pt is on BB, ARB for hypertension.  The pt is  on diabetic medication. Tobacco hx:  current    Past Medical History:  Diagnosis Date   Anxiety    Arthritis    Diabetes mellitus without complication (HCC)    Gout    Hyperlipemia    Hypertension    Hypothyroidism    Insomnia    Pneumonia    Stroke [facility])    Thyroid  disease     Past Surgical History:  Procedure Laterality Date   ABDOMINAL AORTOGRAM W/LOWER EXTREMITY Bilateral 08/21/2020   Procedure: ABDOMINAL AORTOGRAM W/LOWER EXTREMITY;  Surgeon: Harvey Carlin BRAVO, MD;  Location: [facility] IN[facility] LAB;  Service: Cardiovascular;  Laterality:  Bilateral;   ABDOMINAL AORTOGRAM W/LOWER EXTREMITY N/A 09/04/2020   Procedure: ABDOMINAL AORTOGRAM W/LOWER EXTREMITY;  Surgeon: Harvey Carlin BRAVO, MD;  Location: [facility];  Service: Cardiovascular;  Laterality: N/A;   COLONOSCOPY     x2   FEMORAL ARTERY EXPLORATION Left 09/04/2020   Procedure: FEMORAL ARTERY EXPLORATION;  Surgeon: Eliza Lonni RAMAN, MD;  Location: [facility] OR;  Service: Vascular;  Laterality: Left;   NASAL SINUS SURGERY     PERIPHERAL VASCULAR INTERVENTION Left 08/21/2020   Procedure: PERIPHERAL VASCULAR INTERVENTION;  Surgeon: Harvey Carlin BRAVO, MD;  Location: [facility] CV [facility];  Service: Cardiovascular;  Laterality: Left;  sfa stent x 3   PERIPHERAL VASCULAR INTERVENTION  09/04/2020   Procedure: PERIPHERAL VASCULAR INTERVENTION;  Surgeon: Harvey Carlin BRAVO, MD;  Location: [facility];  Service: Cardiovascular;;  Right SFA   RIGHT/LEFT HEART CATH AND CORONARY ANGIOGRAPHY N/A 11/16/2023   Procedure: RIGHT/LEFT HEART CATH AND CORONARY ANGIOGRAPHY;  Surgeon: Ladona Heinz, MD;  Location: [facility];  Service: Cardiovascular;  Laterality: N/A;   SHOULDER ARTHROSCOPY WITH ROTATOR CUFF REPAIR AND SUBACROMIAL DECOMPRESSION Left 02/04/2021   Procedure: SHOULDER ARTHROSCOPY WITH ROTATOR CUFF REPAIR AND  SUBACROMIAL DECOMPRESSION;  Surgeon: Dozier Soulier, MD;  Location: [facility] ORS;  Service: Orthopedics;  Laterality: Left;   WISDOM TOOTH EXTRACTION     WRIST SURGERY     bilat    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; it makes me bleed and caused abdominal pain -Pt can take in small amounts(??)    Ibuprofen Other (See Comments)    CKD   Nsaids Other (See Comments)    Avoids due to kidney issues   Avelox [Moxifloxacin Hcl In Nacl] Nausea And Vomiting and Other (See Comments)    made me really sick   Colchicine Diarrhea   Gabapentin  Other (See Comments)    Affected balance- no longer  takes this   Latex Other (See Comments)    Irritates the skin   Penicillins Other (See Comments)    Severe GI upset- occurred in childhood    Current Outpatient Medications  Medication Sig Dispense Refill   acetaminophen  (TYLENOL ) 500 MG tablet Take 500-1,000 mg by mouth every 6 (six) hours as needed (for pain).     albuterol  (VENTOLIN  HFA) 108 (90 Base) MCG/ACT inhaler Inhale 1-2 puffs into the lungs every 4 (four) hours as needed for wheezing or shortness of breath (and prolonged coughing). 6.7 g 0   clonazePAM  (KLONOPIN ) 0.5 MG tablet Take 0.5 mg by mouth daily as needed for anxiety.     clopidogrel  (PLAVIX ) 75 MG tablet Take 1 tablet (75 mg total) by mouth daily. 30 tablet 11   doxycycline  (VIBRA -TABS) 100 MG tablet Take 1 tablet (100 mg total) by mouth 2 (two) times daily. 20 tablet 0   empagliflozin  (JARDIANCE ) 10 MG TABS tablet Take 1 tablet (10 mg total) by mouth daily before breakfast. 90 tablet 3   Febuxostat  80 MG TABS Take 80 mg by mouth every evening.     fenofibrate micronized (LOFIBRA) 200 MG capsule Take 200 mg by mouth daily.     gabapentin  (NEURONTIN ) 100 MG capsule Take 100 mg by mouth daily.     HYDROcodone -acetaminophen  (NORCO) 7.5-325 MG tablet Take 1 tablet by mouth every 6 (six) hours as needed for moderate pain (pain score 4-6) or severe pain (pain score 7-10).     Ipratropium-Albuterol  (COMBIVENT ) 20-100 MCG/ACT AERS respimat Inhale 1 puff into the lungs 2 (two) times daily. 4 g 0   ketoconazole  (NIZORAL ) 2 % cream Apply topically 2 (two) times daily. 15 g 0   levothyroxine  (SYNTHROID , LEVOTHROID) 200 MCG tablet Take 200 mcg by mouth at bedtime.     lidocaine  (HM LIDOCAINE  PATCH) 4 % Place 1 patch onto the skin daily. 10 patch 1   metoprolol  succinate (TOPROL -XL) 50 MG 24 hr tablet TAKE 1 TABLET BY MOUTH DAILY 90 tablet 1   mupirocin  ointment (BACTROBAN ) 2 % Apply 1 Application topically 2 (two) times daily. Apply to sides of toe twice a day 30 g 2   naloxone   (NARCAN ) nasal spray 4 mg/0.1 mL Use in case of overdose 2 each 0   nicotine  polacrilex (NICORETTE ) 4 MG gum Take 4 mg by mouth as needed for smoking cessation.     rosuvastatin  (CRESTOR ) 20 MG tablet Take 1 tablet (20 mg total) by mouth daily. 90 tablet 3   sacubitril -valsartan  (ENTRESTO ) 24-26 MG Take 1 tablet by mouth 2 (two) times daily. 180 tablet 3   temazepam  (RESTORIL ) 30 MG capsule Take 30 mg by mouth at bedtime.  No current facility-administered medications for this visit.    Family History  Problem Relation Age of Onset   Colon cancer Father    Lung cancer Father    Atrial fibrillation Mother    Autoimmune disease Sister    Healthy Daughter    Healthy Son     Social History   Socioeconomic History   Marital status: Married    Spouse name: Not on file   Number of children: 2   Years of education: BS   Highest education level: Bachelor's degree (e.g., BA, AB, BS)  Occupational History   Occupation: Nutritional therapist  Tobacco Use   Smoking status: Every Day    Current packs/day: 0.50    Types: Cigarettes    Passive exposure: Never   Smokeless tobacco: Former   Tobacco comments:    1/2 PACK A DAY  Vaping Use   Vaping status: Never Used  Substance and Sexual Activity   Alcohol use: Not Currently   Drug use: No   Sexual activity: Not on file  Other Topics Concern   Not on file  Social History Narrative   Married, lives with wife and two teenage children. He and his sister alternate with their parents living with them every other month. His parents are in their upper 80's. He drinks 4-5 cups of coffee a day and 2-3 sodas or glasses of tea. He does not get any regular exercise. Right handed   One story home   Social Drivers of Health   Financial Resource Strain: Not on file  Food Insecurity: No Food Insecurity (12/20/2023)   Hunger Vital Sign    Worried About Running Out of Food in the Last Year: Never true    Ran Out of Food in the Last Year: Never true   Transportation Needs: No Transportation Needs (12/20/2023)   PRAPARE - Administrator, Civil Service (Medical): No    Lack of Transportation (Non-Medical): No  Physical Activity: Not on file  Stress: Not on file  Social Connections: Moderately Integrated (12/20/2023)   Social Connection and Isolation Panel    Frequency of Communication with Friends and Family: More than three times a week    Frequency of Social Gatherings with Friends and Family: Three times a week    Attends Religious Services: 1 to 4 times per year    Active Member of Clubs or Organizations: No    Attends Banker Meetings: Never    Marital Status: Married  Catering manager Violence: Not At Risk (12/20/2023)   Humiliation, Afraid, Rape, and Kick questionnaire    Fear of Current or Ex-Partner: No    Emotionally Abused: No    Physically Abused: No    Sexually Abused: No     REVIEW OF SYSTEMS:   [X]  denotes positive finding, [ ]  denotes negative finding Cardiac  Comments:  Chest pain or chest pressure:    Shortness of breath upon exertion:    Short of breath when lying flat:    Irregular heart rhythm:        Vascular    Pain in calf, thigh, or hip brought on by ambulation:    Pain in feet at night that wakes you up from your sleep:     Blood clot in your veins:    Leg swelling:         Pulmonary    Oxygen at home:    Productive cough:     Wheezing:  Neurologic    Sudden weakness in arms or legs:     Sudden numbness in arms or legs:     Sudden onset of difficulty speaking or slurred speech:    Temporary loss of vision in one eye:     Problems with dizziness:         Gastrointestinal    Blood in stool:     Vomited blood:         Genitourinary    Burning when urinating:     Blood in urine:        Psychiatric    Major depression:         Hematologic    Bleeding problems:    Problems with blood clotting too easily:        Skin    Rashes or ulcers:         Constitutional    Fever or chills:      PHYSICAL EXAMINATION:  Today's Vitals   05/13/24 1327  BP: 114/77  Pulse: (!) 101  Temp: 98 F (36.7 C)  TempSrc: Temporal  Weight: 200 lb (90.7 kg)  PainSc: 0-No pain   Body mass index is 26.39 kg/m.   General:  WDWN in NAD; vital signs documented above Gait: Not observed HENT: WNL, normocephalic Pulmonary: normal non-labored breathing , without wheezing Cardiac: regular HR, without carotid bruits Abdomen: soft, NT; aortic pulse is not palpable Skin: without rashes Vascular Exam/Pulses:  Right Left  Radial 2+ (normal) 2+ (normal)  DP Brisk doppler flow Brisk doppler flow  PT Brisk doppler flow Brisk doppler flow  Peroneal Brisk doppler flow absent   Extremities: without ischemic changes, without Gangrene , without cellulitis; without open wounds Musculoskeletal: no muscle wasting or atrophy  Neurologic: A&O X 3 Psychiatric:  The pt has Normal affect.   Non-Invasive Vascular Imaging:   ABI's/TBI's on 05/13/2024: Right:  0.99/1.07 - Great toe pressure: 113 Left:  0.58/absent - Great toe pressure: absent  Arterial duplex on 05/13/2024: +----------+--------+-----+--------+---------+--------+  RIGHT    PSV cm/sRatioStenosisWaveform Comments  +----------+--------+-----+--------+---------+--------+  CFA Distal78                   biphasic           +----------+--------+-----+--------+---------+--------+  DFA      177                  triphasic          +----------+--------+-----+--------+---------+--------+  SFA Prox  111                  triphasic          +----------+--------+-----+--------+---------+--------+  POP Prox  53                   biphasic           +----------+--------+-----+--------+---------+--------+       Right Stent(s):  +---------------+--++---------++  Prox to Stent  84triphasic  +---------------+--++---------++  Proximal Stent 78triphasic   +---------------+--++---------++  Mid Stent      58triphasic  +---------------+--++---------++  Distal Stent   45biphasic   +---------------+--++---------++  Distal to Stent40biphasic   +---------------+--++---------++            +----------+--------+-----+--------+----------+--------+  LEFT     PSV cm/sRatioStenosisWaveform  Comments  +----------+--------+-----+--------+----------+--------+  CFA Distal88                   biphasic            +----------+--------+-----+--------+----------+--------+  POP Prox  19                   monophasic          +----------+--------+-----+--------+----------+--------+  POP Distal28                   monophasic          +----------+--------+-----+--------+----------+--------+   Left Stent(s):  +---------------+--++----------++  Prox to Stent  88biphasic    +---------------+--++----------++  Proximal Stent  +---------------+--++----------++  Mid Stent      0             +---------------+--++----------++  Distal Stent   0             +---------------+--++----------++  Distal to Stent27monophasic  +---------------+--++----------++   Summary:  Right: Patent SFA stent without stenosis.   Left: No color or spectral Doppler flow observed past the origin of the  SFA stent.   Previous ABI's/TBI's on 02/02/2024: Right:  1.13/0 - Great toe pressure: 0 Left:  0.70/0 - Great toe pressure:  0     ASSESSMENT/PLAN:: 58 y.o. male here for follow up for PAD with hx of stenting of left SFA on 08/21/2020 by Dr. Harvey.  He also had right SFA stenting on 09/04/2020.  This was complicated by hematoma in his left groin requiring primary repair by Dr. Eliza.    -pt has brisk doppler flow bilateral feet.  He does have pain in his feet that sounds more neuropathic with burning and it is equal in both feet.  Do not feel this is due to his arterial flow.  He does  not describe classic rest pain and he does not have any non healing wounds on his feet.  Discussed with him that his left SFA stent is occluded, however, given the above, would not recommend intervention unless he develops rest pain or non healing wounds.  He does not have claudication symptoms as his ataxia limits his ambulation.   -continue statin/Plavix  -discussed importance of increased walking daily but given his ataxia, we discussed getting a machine that he can use to move his legs while in the sitting position.   -discussed protecting his feet and inspecting them daily -discussed the importance of smoking cessation and that it could put him at risk for amputation, MI, stroke, cancer, lung problems.   -pt will f/u in one year with RLE arterial duplex and ABI.   Lucie Apt, [facility] [phone]  Clinic MD:   Gretta on call MD

## 2024-05-14 ENCOUNTER — Encounter (HOSPITAL_BASED_OUTPATIENT_CLINIC_OR_DEPARTMENT_OTHER): Payer: Self-pay | Admitting: Family

## 2024-05-14 ENCOUNTER — Ambulatory Visit (HOSPITAL_BASED_OUTPATIENT_CLINIC_OR_DEPARTMENT_OTHER): Admitting: Family

## 2024-05-14 VITALS — BP 110/78 | HR 89 | Ht 73.0 in | Wt 218.0 lb

## 2024-05-14 DIAGNOSIS — I5042 Chronic combined systolic (congestive) and diastolic (congestive) heart failure: Secondary | ICD-10-CM

## 2024-05-14 DIAGNOSIS — E785 Hyperlipidemia, unspecified: Secondary | ICD-10-CM | POA: Diagnosis not present

## 2024-05-14 DIAGNOSIS — I739 Peripheral vascular disease, unspecified: Secondary | ICD-10-CM | POA: Diagnosis not present

## 2024-05-14 MED ORDER — FUROSEMIDE 20 MG PO TABS: ORAL_TABLET | ORAL | 1 refills | Status: DC

## 2024-05-14 NOTE — Patient Instructions (Signed)
 Medication Instructions:   START LASIX   one (1) tablet by mouth ( 20 mg) X 3 days than as needed for wt gain of 2 lbs in 24 hours or 5 lbs in one week.   *If you need a refill on your cardiac medications before your next appointment, please call your pharmacy*  Lab Work:  Your physician recommends that you return for a FASTING lipid profile AND LFT in 2-3 months, fasting after midnight.  Below is a list of all the labcorps.       If you have labs (blood work) drawn today and your tests are completely normal, you will receive your results only by: MyChart Message (if you have MyChart) OR A paper copy in the mail If you have any lab test that is abnormal or we need to change your treatment, we will call you to review the results.  Testing/Procedures:  None ordered.   Follow-Up: At [facility], you and your health needs are our priority.  As part of our continuing mission to provide you with exceptional heart care, our providers are all part of one team.  This team includes your primary Cardiologist (physician) and Advanced Practice Providers or APPs (Physician Assistants and Nurse Practitioners) who all work together to provide you with the care you need, when you need it.  Your next appointment:   6 month(s)  Provider:   Shelda Bruckner, MD or Reche Finder, NP    We recommend signing up for the patient portal called MyChart.  Sign up information is provided on this After Visit Summary.  MyChart is used to connect with patients for Virtual Visits (Telemedicine).  Patients are able to view lab/test results, encounter notes, upcoming appointments, etc.  Non-urgent messages can be sent to your provider as well.   To learn more about what you can do with MyChart, go to [email or web address].   Other Instructions  Your physician wants you to follow-up in: 6 months.  You will receive a reminder letter in the mail two months in advance. If you [place]'t receive a  letter, please call our office to schedule the follow-up appointment.       If you do not hear from the Advanced Heart Failure clinic by the end of the week, recommend calling [phone] to check on status of your referral.   Recommend weighing daily and keeping a log. Please call our office if you have weight gain of 2 pounds overnight or 5 pounds in 1 week.   Date  Time Weight

## 2024-05-14 NOTE — Progress Notes (Signed)
 Cardiology Office Note:  .   Date:  05/14/2024  ID:  Dustin Sampson, DOB 1966/03/10, MRN [id number] PCP: Dustin Pfeiffer, PA-C  Nephrology: Dustin Sampson [facility] Providers Cardiologist:  Dustin Bruckner, MD    History of Present Illness: .   Dustin Sampson is a 58 y.o. male with history of hypertension, hyperlipidemia, DM2, hypothyroidism, PAD s/p bilateral SFA stenting 08/2020, tobacco use, prior CVA, MGUS, PVC, HFrEF, CKD 2A, CAD, large B-cell lymphoma.  Previously abnormal nuclear stress test 2021 with inferior and septal perfusion defect.  LHC deferred by  Dustin Sampson at that time. Echo LVEF 45% at that time which reportedly normalized 06/2022. Prior monitor 2023 revealed 10% PVC burden.  Admitted 1/21 - 11/17/2023 with heart failure exacerbation.  Echo LVEF 20 to 25%.  LHC revealed thrombotic occlusion of RCA which appeared be chronic and ulcerated focal stenosis of OM1.  Recommended for medical therapy of symptoms and severe LV dysfunction did not correlate with underlying CAD.  Discharged on Jardiance , Entresto , metoprolol , spironolactone .  It was not taking levothyroxine  appropriately and TSH elevated, recommended to continue present dose and repeat labs in a few weeks to consider dose adjustment.  At clinic visit 11/27/23 he had myriad of concerned including frequent falls (reported 25x since hospital discharge) and recommended for ED evaluation due to hitting his head 3x with no concerning findings.   Subsequently admitted 2/25 - 12/25/23 after mechanical fall resulting in shoulder and facial injury as well as subdural hematoma and was discharged to inpatient rehab having since returned home 01/09/2024.  Seen by Dustin Sampson 01/18/2024.  He was in wheelchair full-time.  Resuming low-dose Entresto  had improved his breathing.  As his GFR > 60 Jardiance  was resumed.  Episode 02/2024 he was recommended to discontinue Gatorade due to sodium content and adjust to fluid restriction of 64 ounces  per day.  Echo 04/29/24 LVEF 20-25%, gr3dd, RVSF moderately reduced, elevated LVEDP, no significant valvular abnormality. He was referr to EP for consideration of ICD as well as Advanced Heart Failure clinic. He reported nausea with Jardiance  and was offered transition to [place] but opted to stay on Jardiance .   Seen yesterday by Dustin Sampson with brisk doppler flow and pain consistent with neuropathy. Known occlusion of left SFA stent however given brisk flow, no intervention recommended. Aspirin , plavix , and exercise pedals in place of walking given ataxia was recommended.  Presents today for follow up with his wife. Notes mild shortness of breath but activity is limited due to his ataxia. Follows with physical therapy. Reports no chest pain, pressure, tightness. No LE edema, orthopnea, PND. He notes his prior nausea has resolved. Reviewed echo and he is understandably disheartened that LVEF not improved. Wonders if ataxia contributory.   ROS:  Please see the history of present illness.    All other systems reviewed and are negative.    Studies Reviewed: .           Risk Assessment/Calculations:             Physical Exam:   VS:  BP 110/78 (BP Location: Right Arm, Patient Position: Sitting, Cuff Size: Normal)   Pulse 89   Ht 6' 1 (1.854 m)   Wt 218 lb (98.9 kg)   SpO2 98%   BMI 28.76 kg/m    Wt Readings from Last 3 Encounters:  05/14/24 218 lb (98.9 kg)  05/13/24 200 lb (90.7 kg)  01/18/24 201 lb (91.2 kg)    GEN: no acute distress, well developed  NECK: No JVD; No carotid bruits CARDIAC: RRR, no murmurs, rubs, gallops RESPIRATORY:  Clear to auscultation without rales, wheezing.  ABDOMEN: Soft, non-tender, non-distended EXTREMITIES:  No edema; No deformity   ASSESSMENT AND PLAN: .    CAD/HLD, LDL goal less than 55/PVD/Aortic atherosclerosis- CAD out of proportion to his HF. GDMT includes Jardiance  10 mg daily,  Plavix  75 mg daily, Metoprolol  succinate 50 mg daily., Rosuvastatin  20mg   daily.  Update direct LDL, ALT in 2-3 months. If LDL not at goal, consider ERD0p.  Recommend aiming for 150 minutes of moderate intensity activity per week and following a heart healthy diet.    HFrEF - LHC 10/2023 does not explain severity of cardiomyopathy. GDMT limited by renal function.  GDMT includes Jardiance  10 mg daily, Entresto  24-26 mg twice daily, Toprol  50 mg daily.  Will not add MRA due to relative hypotension.   Echo 04/2024 LVEF 20-25% Has been referred to Advanced Heart Failure Clinic, awaits scheduling. He inquires if ataxia could be contributory, encouraged to discuss with their team.  Appt upcoming with EP to discuss possible ICD given persistent LVEF <35% Concern for volume overload by echo, GDMT limited by hypotension. Rx Lasix  20mg  daily x 3 days then PRN for weight gain of 2 lbs over night or 5 lbs in one week Low sodium diet, fluid restriction <2L, and daily weights encouraged. Educated to contact our office for weight gain of 2 lbs overnight or 5 lbs in one week.   PAD -Follows with Dustin Sampson. Recent ABI with brisk flow and no intervention recommended.   CKD - Careful titration of diuretic and antihypertensive.  Follows with Dustin Sampson of nephrology.  History of CVA - Continue Rosuvastatin  20 daily, Plavix  75mg  daily.   PVC- Continue Metoprolol . Denies palpitations.   DM2- Continue to follow with PCP.         Dispo: follow up with EP and [facility] Failure Clinic - Follow up with Dustin Sampson in 6 months  Signed, Dustin Sampson Finder, NP

## 2024-05-24 ENCOUNTER — Telehealth: Payer: Self-pay | Admitting: Physical Medicine and Rehabilitation

## 2024-05-24 NOTE — Telephone Encounter (Signed)
 Patient's wife called stating that he needs your email for a disability form to get a pass for his vacation.  He is going on vacation and needs it for insurance purposes.  I told her I didn't want to give out that information.

## 2024-05-27 NOTE — Telephone Encounter (Signed)
 LVM informing that they will need to send through [facility] or drop off to the office.

## 2024-05-29 ENCOUNTER — Telehealth: Payer: Self-pay | Admitting: Cardiology

## 2024-05-29 NOTE — Telephone Encounter (Signed)
 Patient calling in to make our office aware of that his job is sending over paperwork for his disability. Ask that our office filled them out and send back asap. Please advise

## 2024-06-10 NOTE — Progress Notes (Unsigned)
 Electrophysiology Office Note:   Date:  06/12/2024  ID:  Dustin Sampson, DOB September 23, 1966, MRN [id number]  Primary Cardiologist: Shelda Bruckner, MD Electrophysiologist: Fonda Kitty, MD      History of Present Illness:   Dustin Sampson is a 58 y.o. male with h/o hypertension, hyperlipidemia, DM2, hypothyroidism, PAD s/p bilateral SFA stenting 08/2020, tobacco use, prior CVA, MGUS, PVC, HFrEF, CKD 2A, CAD, large B-cell lymphoma  who is being seen today for evaluation for ICD implant.   Discussed the use of AI scribe software for clinical note transcription with the patient, who gave verbal consent to proceed.  History of Present Illness Dustin Sampson is a 58 year old male with a history of heart blockages who presents for evaluation of defibrillator placement. He was referred by his cardiologist for evaluation of defibrillator placement. He has a current ejection fraction of approximately 20%. His heart function was previously at 40% but has since declined to 20%. He has a history of blockages in his heart arteries. He has a history of ataxia, which has been worsening, affecting his speech and causing slurred words. He is also under follow-up care for B-cell lymphoma, which he believes is in remission, as he is not currently receiving any treatment such as chemotherapy. He is currently recovering from a broken wrist, which was the result of a fall. No new or acute complaints today.  Review of systems complete and found to be negative unless listed in HPI.   EP Information / Studies Reviewed:    EKG is ordered today. Personal review as below.  EKG Interpretation Date/Time:  Tuesday June 11 2024 09:52:12 EDT Ventricular Rate:  89 PR Interval:  152 QRS Duration:  98 QT Interval:  378 QTC Calculation: 459 R Axis:   -37  Text Interpretation: Normal sinus rhythm with sinus arrhythmia Left axis deviation Possible Anterior infarct , age undetermined When compared with ECG of 27-Nov-2023 11:50,   No significant change since. Confirmed by Kitty Fonda [phone]) on 06/11/2024 10:47:00 AM   Echo 04/29/24:   1. Calcified LV apical false tendon (normal variant) - Definity  contrast  not performed d/t inability to gain IV access. Left ventricular ejection  fraction, by estimation, is 20 to 25%. The left ventricle has severely  decreased function. The left  ventricle demonstrates global hypokinesis. Left ventricular diastolic  parameters are consistent with Grade III diastolic dysfunction  (restrictive). Elevated left ventricular end-diastolic pressure. The E/e'  is 94. The average left ventricular global  longitudinal strain is -3.3 %. The global longitudinal strain is abnormal.   2. Right ventricular systolic function is moderately reduced. The right  ventricular size is mildly enlarged. Tricuspid regurgitation signal is  inadequate for assessing PA pressure.   3. The mitral valve is grossly normal. Trivial mitral valve  regurgitation.   4. The aortic valve is tricuspid. Aortic valve regurgitation is not  visualized.   5. The inferior vena cava is normal in size with greater than 50%  respiratory variability, suggesting right atrial pressure of 3 mmHg.        Physical Exam:   VS:  BP (!) 166/93   Pulse 89   Ht 6' 1 (1.854 m)   Wt 215 lb (97.5 kg)   SpO2 97%   BMI 28.37 kg/m    Wt Readings from Last 3 Encounters:  06/11/24 215 lb (97.5 kg)  05/14/24 218 lb (98.9 kg)  05/13/24 200 lb (90.7 kg)     GEN: Well nourished, well developed in no acute  distress NECK: No JVD CARDIAC: Normal rate, regular rhythm. RESPIRATORY:  Clear to auscultation without rales, wheezing or rhonchi  ABDOMEN: Soft, non-distended EXTREMITIES:  No edema; No deformity   ASSESSMENT AND PLAN:    #Chronic systolic heart failure: Well compensated on exam today. #Ischemic cardiomyopathy: - Patient meets criteria for primary prevention ICD in the setting of ischemic cardiomyopathy with LVEF less than 35%  despite greater than 90 days of optimal medical therapy. Explained risks, benefits, and alternatives to ICD implantation, including but not limited to bleeding, infection, damage to heart or lungs, heart attack, stroke, or death.  Pt verbalized understanding and wants to proceed.  Plan for [facility] Scientific VVI ICD. -Continue GDMT regimen of empagliflozin  10 mg once daily, metoprolol  XL 50 mg daily, Entresto  24-26 mg twice daily. -Continue follow-up with general cardiology.   #CAD: Denies chest pain. -Continue Plavix  75 mg once daily and Crestor  20 mg daily.  Continue follow-up with general cardiology.  #Hypertension -Above goal today.  Recommend checking blood pressures 1-2 times per week at home and recording the values.  Recommend bringing these recordings to the primary care physician.   Follow up with Dr. Kennyth 3 months after ICD implant.   Signed, Fonda Kennyth, MD

## 2024-06-11 ENCOUNTER — Ambulatory Visit: Attending: Cardiology | Admitting: Cardiology

## 2024-06-11 ENCOUNTER — Encounter: Payer: Self-pay | Admitting: Cardiology

## 2024-06-11 VITALS — BP 166/93 | HR 89 | Ht 73.0 in | Wt 215.0 lb

## 2024-06-11 DIAGNOSIS — I2510 Atherosclerotic heart disease of native coronary artery without angina pectoris: Secondary | ICD-10-CM

## 2024-06-11 DIAGNOSIS — I255 Ischemic cardiomyopathy: Secondary | ICD-10-CM | POA: Diagnosis not present

## 2024-06-11 DIAGNOSIS — I5042 Chronic combined systolic (congestive) and diastolic (congestive) heart failure: Secondary | ICD-10-CM

## 2024-06-11 DIAGNOSIS — I10 Essential (primary) hypertension: Secondary | ICD-10-CM | POA: Diagnosis not present

## 2024-06-11 NOTE — Patient Instructions (Signed)
 Medication Instructions:  Your physician recommends that you continue on your current medications as directed. Please refer to the Current Medication list given to you today.  *If you need a refill on your cardiac medications before your next appointment, please call your pharmacy*  Lab Work: BMET and CBC   Testing/Procedures: ICD Implant Your physician has recommended that you have a defibrillator inserted. An implantable cardioverter defibrillator (ICD) is a small device that is placed in your chest or, in rare cases, your abdomen. This device uses electrical pulses or shocks to help control life-threatening, irregular heartbeats that could lead the heart to suddenly stop beating (sudden cardiac arrest). Leads are attached to the ICD that goes into your heart. This is done in the hospital and usually requires an overnight stay. Please see the instruction sheet given to you today for more information.  Follow-Up: At [facility], you and your health needs are our priority.  As part of our continuing mission to provide you with exceptional heart care, our providers are all part of one team.  This team includes your primary Cardiologist (physician) and Advanced Practice Providers or APPs (Physician Assistants and Nurse Practitioners) who all work together to provide you with the care you need, when you need it.  Your next appointment:   We will contact you to arrange your post procedure appointments

## 2024-06-18 ENCOUNTER — Ambulatory Visit (HOSPITAL_COMMUNITY)
Admission: RE | Admit: 2024-06-18 | Discharge: 2024-06-18 | Disposition: A | Discharge status: Home / Self Care | Source: Ambulatory Visit | Attending: Cardiology | Admitting: Cardiology

## 2024-06-18 VITALS — BP 140/88 | HR 91

## 2024-06-18 DIAGNOSIS — I2510 Atherosclerotic heart disease of native coronary artery without angina pectoris: Secondary | ICD-10-CM | POA: Diagnosis not present

## 2024-06-18 DIAGNOSIS — I255 Ischemic cardiomyopathy: Secondary | ICD-10-CM

## 2024-06-18 DIAGNOSIS — I5042 Chronic combined systolic (congestive) and diastolic (congestive) heart failure: Secondary | ICD-10-CM | POA: Diagnosis not present

## 2024-06-18 DIAGNOSIS — I5022 Chronic systolic (congestive) heart failure: Secondary | ICD-10-CM | POA: Insufficient documentation

## 2024-06-18 MED ORDER — METFORMIN HCL 500 MG PO TABS: 500.0000 mg | ORAL_TABLET | Freq: Two times a day (BID) | ORAL | 1 refills | Status: DC

## 2024-06-18 MED ORDER — SACUBITRIL-VALSARTAN 49-51 MG PO TABS: 1.0000 | ORAL_TABLET | Freq: Two times a day (BID) | ORAL | 3 refills | Status: AC

## 2024-06-18 NOTE — Progress Notes (Signed)
 Cardiomyopathy  genetic testing collected via blood  per Dr Elwyn Lade.  Order form completed, signed and shipped with sample by FedEx to [facility].

## 2024-06-18 NOTE — Patient Instructions (Addendum)
 INCREASE Entresto  to 49/51 mg Twice daily  Genetic testing has been collected, this has to be sent to [place]  for processing and can take 1-2 weeks for us  to get results back.  We will let you know the results once reviewed by your provider.  Your physician recommends that you schedule a follow-up appointment in: 3 months ( November) ** PLEASE CALL THE OFFICE IN OCTOBER TO ARRANGE YOUR FOLLOW UP APPOINTMENT.**  If you have any questions or concerns before your next appointment please send us  a message through [place] or call our office at [phone].    TO LEAVE A MESSAGE FOR THE NURSE SELECT OPTION 2, PLEASE LEAVE A MESSAGE INCLUDING: YOUR NAME DATE OF BIRTH CALL BACK NUMBER REASON FOR CALL**this is important as we prioritize the call backs  YOU WILL RECEIVE A CALL BACK THE SAME DAY AS LONG AS YOU CALL BEFORE 4:00 PM  At the [facility], you and your health needs are our priority. As part of our continuing mission to provide you with exceptional heart care, we have created designated Provider Care Teams. These Care Teams include your primary Cardiologist (physician) and Advanced Practice Providers (APPs- Physician Assistants and Nurse Practitioners) who all work together to provide you with the care you need, when you need it.   You may see any of the following providers on your designated Care Team at your next follow up: Dr Toribio Fuel Dr Ezra Shuck Dr. Ria Commander Dr. Morene Brownie Amy Lenetta, NP Caffie Shed, [place] [facility] [place], [place] Beckey Coe, NP [place] Lee, NP Ellouise Class, NP Tinnie Redman, PharmD Jaun Bash, PharmD   Please be sure to bring in all your medications bottles to every appointment.    Thank you for choosing [facility]

## 2024-06-23 NOTE — Progress Notes (Signed)
   ADVANCED HEART FAILURE NEW PATIENT CLINIC NOTE  Referring Physician: Katina Pfeiffer, PA-C  Primary Care: Katina Pfeiffer, PA-C Primary Cardiologist:  HPI: Dustin Sampson is a 58 y.o. male with a PMH of hypertension, hyperlipidemia, DM2, hypothyroidism, PAD s/p bilateral SFA stenting 08/2020, tobacco use, prior CVA, MGUS, PVC, HFrEF, CKD 2A, CAD, large B-cell lymphoma  who presents for initial visit for further evaluation and treatment of heart failure/cardiomyopathy.      Patient has a prior history of CAD, presented 10/2023 with heart failure exacerbation, occluded RCA noted and ulcerated OM1, appears out of proportion to his known cardiomyopathy.  Patient has a known history of ataxia, father also has a similar history and appears to have correlated around the time of his B-cell lymphoma treatment.  Reports that when his heart failure symptoms flare his ataxia gets much worse.     SUBJECTIVE:  Patient in a wheelchair today, accompanied by his friend.  Reports that the main limitation to his activity level is his ataxia, otherwise he has some mild shortness of breath with exertion. He denies any volume symptoms including orthopnea, PND, leg swelling.  He denies any active or recent chest pain.  We discussed his echocardiogram, he has a family history of cardiac disease though does not know about any cardiomyopathy history.  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  Given his recent falls he is a poor anticoagluation candidate, which we discussed would be the main reason to consider CMR.   PMH, current medications, allergies, social history, and family history reviewed in epic.  PHYSICAL EXAM: Vitals:   06/18/24 1102  BP: (!) 140/88  Pulse: 91  SpO2: 98%   GENERAL: Chronically ill-appearing, in a wheelchair PULM:  Normal work of breathing, clear to auscultation bilaterally. Respirations are unlabored.  CARDIAC:  JVP: flat          Normal rate with regular rhythm. No murmurs, rubs or gallops.  No edema. Warm and well perfused extremities. ABDOMEN: Soft, non-tender, non-distended. NEUROLOGIC: Patient is oriented x3 with no focal or lateralizing neurologic deficits.    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 Poor anticoagulation candidate given multiple falls, no urgency to get CMR prior to ICD. 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 - Increase entresto  as above  CAD: Noted on recent cath. - No anginal symptoms - Continue plavix  75mg  daily - Continue crestor  20mg  daily  Diabetes:  - Continue follow up with PCP  Ataxia: Seen by multiple neurologist, has had genetic testing for this in the past without effect. -Father had similar symptoms - Continue neurology follow-up   Follow up in 2-3 months  Morene Brownie, MD Advanced Heart Failure Mechanical Circulatory Support 06/23/24

## 2024-07-02 ENCOUNTER — Other Ambulatory Visit (HOSPITAL_BASED_OUTPATIENT_CLINIC_OR_DEPARTMENT_OTHER): Payer: Self-pay

## 2024-07-02 ENCOUNTER — Telehealth: Payer: Self-pay | Admitting: Cardiology

## 2024-07-02 MED ORDER — FLUCONAZOLE 150 MG PO TABS
150.0000 mg | ORAL_TABLET | Freq: Every day | ORAL | 0 refills | Status: DC
  Filled 2024-07-02: qty 1, 1d supply, fill #0

## 2024-07-02 NOTE — Telephone Encounter (Signed)
 Patient walked into the lobby stating felt like has developed yeast infection, currently taking Jardiance  Discussed with Caitlin, Rx Diflucan  150 mg x 1 dose only   Advised patient and sent Rx to pharmacy

## 2024-07-02 NOTE — Telephone Encounter (Signed)
 Pt c/o medication issue:  1. Name of Medication:   empagliflozin  (JARDIANCE ) 10 MG TABS tablet    2. How are you currently taking this medication (dosage and times per day)?    3. Are you having a reaction (difficulty breathing--STAT)? no  4. What is your medication issue? Patient states that the medication is causing him to have a yeast infection. Please advise

## 2024-07-03 ENCOUNTER — Encounter (HOSPITAL_COMMUNITY): Payer: Self-pay

## 2024-07-03 LAB — BASIC METABOLIC PANEL WITH GFR
BUN/Creatinine Ratio: 14 (ref 9–20)
BUN: 18 mg/dL (ref 6–24)
CO2: 22 mmol/L (ref 20–29)
Calcium: 9.7 mg/dL (ref 8.7–10.2)
Chloride: 96 mmol/L (ref 96–106)
Creatinine, Ser: 1.27 mg/dL (ref 0.76–1.27)
Glucose: 119 mg/dL — AB (ref 70–99)
Potassium: 4.4 mmol/L (ref 3.5–5.2)
Sodium: 133 mmol/L — AB (ref 134–144)
eGFR: 65 mL/min/1.73 (ref >59)

## 2024-07-03 LAB — CBC WITH DIFFERENTIAL/PLATELET
Basophils Absolute: 0.1 x10E3/uL (ref 0.0–0.2)
Basos: 1 %
EOS (ABSOLUTE): 0.2 x10E3/uL (ref 0.0–0.4)
Eos: 3 %
Hematocrit: 41 % (ref 37.5–51.0)
Hemoglobin: 13.9 g/dL (ref 13.0–17.7)
Immature Grans (Abs): 0 x10E3/uL (ref 0.0–0.1)
Immature Granulocytes: 0 %
Lymphocytes Absolute: 1.6 x10E3/uL (ref 0.7–3.1)
Lymphs: 21 %
MCH: 30 pg (ref 26.6–33.0)
MCHC: 33.9 g/dL (ref 31.5–35.7)
MCV: 89 fL (ref 79–97)
Monocytes Absolute: 0.6 x10E3/uL (ref 0.1–0.9)
Monocytes: 8 %
Neutrophils Absolute: 4.9 x10E3/uL (ref 1.4–7.0)
Neutrophils: 67 %
Platelets: 203 x10E3/uL (ref 150–450)
RBC: 4.63 x10E6/uL (ref 4.14–5.80)
RDW: 13.3 % (ref 11.6–15.4)
WBC: 7.3 x10E3/uL (ref 3.4–10.8)

## 2024-07-14 MED ORDER — SODIUM CHLORIDE 0.9 % IV SOLN
80.0000 mg | INTRAVENOUS | Status: AC
  Administered 2024-07-15: 80 mg
  Filled 2024-07-14: qty 2

## 2024-07-15 ENCOUNTER — Other Ambulatory Visit: Payer: Self-pay

## 2024-07-15 ENCOUNTER — Encounter
Admission: RE | Disposition: A | Discharge status: Home / Self Care | Payer: Self-pay | Source: Home / Self Care | Attending: Cardiology

## 2024-07-15 ENCOUNTER — Ambulatory Visit

## 2024-07-15 ENCOUNTER — Ambulatory Visit
Admission: RE | Admit: 2024-07-15 | Discharge: 2024-07-15 | Disposition: A | Discharge status: Home / Self Care | Attending: Cardiology | Admitting: Cardiology

## 2024-07-15 ENCOUNTER — Encounter: Payer: Self-pay | Admitting: Cardiology

## 2024-07-15 DIAGNOSIS — Z8673 Personal history of transient ischemic attack (TIA), and cerebral infarction without residual deficits: Secondary | ICD-10-CM | POA: Insufficient documentation

## 2024-07-15 DIAGNOSIS — Z7984 Long term (current) use of oral hypoglycemic drugs: Secondary | ICD-10-CM | POA: Insufficient documentation

## 2024-07-15 DIAGNOSIS — E785 Hyperlipidemia, unspecified: Secondary | ICD-10-CM | POA: Insufficient documentation

## 2024-07-15 DIAGNOSIS — E1151 Type 2 diabetes mellitus with diabetic peripheral angiopathy without gangrene: Secondary | ICD-10-CM | POA: Insufficient documentation

## 2024-07-15 DIAGNOSIS — C833A Diffuse large b-cell lymphoma, in remission: Secondary | ICD-10-CM | POA: Diagnosis not present

## 2024-07-15 DIAGNOSIS — E1122 Type 2 diabetes mellitus with diabetic chronic kidney disease: Secondary | ICD-10-CM | POA: Diagnosis not present

## 2024-07-15 DIAGNOSIS — I2510 Atherosclerotic heart disease of native coronary artery without angina pectoris: Secondary | ICD-10-CM | POA: Diagnosis not present

## 2024-07-15 DIAGNOSIS — E039 Hypothyroidism, unspecified: Secondary | ICD-10-CM | POA: Insufficient documentation

## 2024-07-15 DIAGNOSIS — N182 Chronic kidney disease, stage 2 (mild): Secondary | ICD-10-CM | POA: Insufficient documentation

## 2024-07-15 DIAGNOSIS — I5042 Chronic combined systolic (congestive) and diastolic (congestive) heart failure: Secondary | ICD-10-CM | POA: Diagnosis present

## 2024-07-15 DIAGNOSIS — Z720 Tobacco use: Secondary | ICD-10-CM | POA: Diagnosis not present

## 2024-07-15 DIAGNOSIS — I255 Ischemic cardiomyopathy: Secondary | ICD-10-CM | POA: Insufficient documentation

## 2024-07-15 DIAGNOSIS — Z79899 Other long term (current) drug therapy: Secondary | ICD-10-CM | POA: Diagnosis not present

## 2024-07-15 DIAGNOSIS — I5022 Chronic systolic (congestive) heart failure: Secondary | ICD-10-CM | POA: Diagnosis not present

## 2024-07-15 DIAGNOSIS — Z95820 Peripheral vascular angioplasty status with implants and grafts: Secondary | ICD-10-CM | POA: Insufficient documentation

## 2024-07-15 DIAGNOSIS — I130 Hypertensive heart and chronic kidney disease with heart failure and stage 1 through stage 4 chronic kidney disease, or unspecified chronic kidney disease: Secondary | ICD-10-CM | POA: Insufficient documentation

## 2024-07-15 HISTORY — PX: ICD IMPLANT: EP1208

## 2024-07-15 HISTORY — DX: Non-Hodgkin lymphoma, unspecified, unspecified site: C85.90

## 2024-07-15 HISTORY — DX: Ataxia, unspecified: R27.0

## 2024-07-15 LAB — GLUCOSE, CAPILLARY: Glucose-Capillary: 179 mg/dL — ABNORMAL HIGH (ref 70–99)

## 2024-07-15 SURGERY — ICD IMPLANT
Anesthesia: Moderate Sedation

## 2024-07-15 MED ORDER — LIDOCAINE HCL (PF) 1 % IJ SOLN
INTRAMUSCULAR | Status: AC
  Filled 2024-07-15: qty 30

## 2024-07-15 MED ORDER — CHLORHEXIDINE GLUCONATE 4 % EX SOLN: 4.0000 | Freq: Once | CUTANEOUS | Status: DC

## 2024-07-15 MED ORDER — LIDOCAINE HCL (PF) 1 % IJ SOLN
INTRAMUSCULAR | Status: DC | PRN
  Administered 2024-07-15: 50 mL

## 2024-07-15 MED ORDER — HYDROCODONE-ACETAMINOPHEN 10-325 MG PO TABS
1.0000 | ORAL_TABLET | Freq: Once | ORAL | Status: AC
  Administered 2024-07-15: 1 via ORAL
  Filled 2024-07-15: qty 1

## 2024-07-15 MED ORDER — HYDROCODONE-ACETAMINOPHEN 5-325 MG PO TABS
ORAL_TABLET | ORAL | Status: AC
  Filled 2024-07-15: qty 1

## 2024-07-15 MED ORDER — MIDAZOLAM HCL 2 MG/2ML IJ SOLN
INTRAMUSCULAR | Status: AC
  Filled 2024-07-15: qty 2

## 2024-07-15 MED ORDER — FENTANYL CITRATE (PF) 100 MCG/2ML IJ SOLN
INTRAMUSCULAR | Status: DC | PRN
  Administered 2024-07-15: 50 ug via INTRAVENOUS
  Administered 2024-07-15 (×2): 25 ug via INTRAVENOUS

## 2024-07-15 MED ORDER — SODIUM CHLORIDE 0.9 % IV SOLN: INTRAVENOUS | Status: DC

## 2024-07-15 MED ORDER — MIDAZOLAM HCL 2 MG/2ML IJ SOLN
INTRAMUSCULAR | Status: DC | PRN
  Administered 2024-07-15: .5 mg via INTRAVENOUS
  Administered 2024-07-15: 1 mg via INTRAVENOUS
  Administered 2024-07-15: .5 mg via INTRAVENOUS

## 2024-07-15 MED ORDER — POVIDONE-IODINE 10 % EX SWAB
2.0000 | Freq: Once | CUTANEOUS | Status: AC
  Administered 2024-07-15: 2 via TOPICAL

## 2024-07-15 MED ORDER — ACETAMINOPHEN 325 MG PO TABS
325.0000 mg | ORAL_TABLET | ORAL | Status: DC | PRN
  Administered 2024-07-15: 650 mg via ORAL

## 2024-07-15 MED ORDER — HEPARIN (PORCINE) IN NACL 1000-0.9 UT/500ML-% IV SOLN
INTRAVENOUS | Status: DC | PRN
  Administered 2024-07-15: 500 mL

## 2024-07-15 MED ORDER — FENTANYL CITRATE (PF) 100 MCG/2ML IJ SOLN
INTRAMUSCULAR | Status: AC
  Filled 2024-07-15: qty 2

## 2024-07-15 MED ORDER — ACETAMINOPHEN 325 MG PO TABS
ORAL_TABLET | ORAL | Status: AC
  Filled 2024-07-15: qty 2

## 2024-07-15 MED ORDER — ONDANSETRON HCL 4 MG/2ML IJ SOLN: 4.0000 mg | Freq: Four times a day (QID) | INTRAMUSCULAR | Status: DC | PRN

## 2024-07-15 MED ORDER — HEPARIN (PORCINE) IN NACL 1000-0.9 UT/500ML-% IV SOLN
INTRAVENOUS | Status: AC
  Filled 2024-07-15: qty 500

## 2024-07-15 MED ORDER — CHLORHEXIDINE GLUCONATE CLOTH 2 % EX PADS: 6.0000 | MEDICATED_PAD | Freq: Every day | CUTANEOUS | Status: DC

## 2024-07-15 MED ORDER — CEFAZOLIN SODIUM-DEXTROSE 2-4 GM/100ML-% IV SOLN
2.0000 g | INTRAVENOUS | Status: AC
  Administered 2024-07-15: 2 g via INTRAVENOUS

## 2024-07-15 SURGICAL SUPPLY — 11 items
CABLE SURG 12 DISP A/V CHANNEL (MISCELLANEOUS) IMPLANT
DEVICE DSSCT PLSMBLD 3.0S LGHT (MISCELLANEOUS) IMPLANT
ICD VIGILANT VR D232 (Pacemaker) IMPLANT
LEAD RELIANCE 0673 IMPLANT
PAD ELECT DEFIB RADIOL ZOLL (MISCELLANEOUS) IMPLANT
SHEATH 6FR PRELUDE SNAP 13 (SHEATH) IMPLANT
SHEATH 9FR PRELUDE SNAP 13 (SHEATH) IMPLANT
SUT ETHIBOND CT1 BRD #0 30IN (SUTURE) IMPLANT
SUT MNCRL AB 4-0 PS2 18 (SUTURE) IMPLANT
SUT VIC AB 2-0 CT2 27 (SUTURE) IMPLANT
TRAY PACEMAKER INSERTION (PACKS) ×2 IMPLANT

## 2024-07-15 NOTE — H&P (Signed)
 Electrophysiology Note:   Date:  07/15/24  ID:  Dustin Sampson, DOB Feb 09, 1966, MRN [id number]   Primary Cardiologist: Shelda Bruckner, MD Electrophysiologist: Fonda Kitty, MD       History of Present Illness:   Dustin Sampson is a 58 y.o. male with h/o hypertension, hyperlipidemia, DM2, hypothyroidism, PAD s/p bilateral SFA stenting 08/2020, tobacco use, prior CVA, MGUS, PVC, HFrEF, CKD 2A, CAD, large B-cell lymphoma  who is being seen today for evaluation for ICD implant.    Discussed the use of AI scribe software for clinical note transcription with the patient, who gave verbal consent to proceed.   History of Present Illness Dustin Sampson is a 58 year old male with a history of heart blockages who presents for evaluation of defibrillator placement. He was referred by his cardiologist for evaluation of defibrillator placement. He has a current ejection fraction of approximately 20%. His heart function was previously at 40% but has since declined to 20%. He has a history of blockages in his heart arteries. He has a history of ataxia, which has been worsening, affecting his speech and causing slurred words. He is also under follow-up care for B-cell lymphoma, which he believes is in remission, as he is not currently receiving any treatment such as chemotherapy. He is currently recovering from a broken wrist, which was the result of a fall. No new or acute complaints today.  Interval: Patient presents today for planned ICD implant. Reports feeling relatively well. No new or acute complaints.    Review of systems complete and found to be negative unless listed in HPI.    EP Information / Studies Reviewed:      EKG Interpretation Date/Time:                  Tuesday June 11 2024 09:52:12 EDT Ventricular Rate:         89 PR Interval:                 152 QRS Duration:             98 QT Interval:                 378 QTC Calculation:459 R Axis:                         -37   Text  Interpretation:      Normal sinus rhythm with sinus arrhythmia Left axis deviation Possible Anterior infarct , age undetermined When compared with ECG of 27-Nov-2023 11:50,  No significant change since. Confirmed by Kitty Fonda [phone]) on 06/11/2024 10:47:00 AM    Echo 04/29/24:   1. Calcified LV apical false tendon (normal variant) - Definity  contrast  not performed d/t inability to gain IV access. Left ventricular ejection  fraction, by estimation, is 20 to 25%. The left ventricle has severely  decreased function. The left  ventricle demonstrates global hypokinesis. Left ventricular diastolic  parameters are consistent with Grade III diastolic dysfunction  (restrictive). Elevated left ventricular end-diastolic pressure. The E/e'  is 67. The average left ventricular global  longitudinal strain is -3.3 %. The global longitudinal strain is abnormal.   2. Right ventricular systolic function is moderately reduced. The right  ventricular size is mildly enlarged. Tricuspid regurgitation signal is  inadequate for assessing PA pressure.   3. The mitral valve is grossly normal. Trivial mitral valve  regurgitation.   4. The aortic valve is tricuspid. Aortic valve regurgitation is not  visualized.   5. The inferior vena cava is normal in size with greater than 50%  respiratory variability, suggesting right atrial pressure of 3 mmHg.          Physical Exam:    Today's Vitals   07/15/24 0707 07/15/24 0812  Temp: 98.4 F (36.9 C)   TempSrc: Oral   Weight: 97.5 kg   Height: 6' 1 (1.854 m)   PainSc: 7  0-No pain   Body mass index is 28.37 kg/m.  GEN: Well nourished, well developed in no acute distress NECK: No JVD CARDIAC: Normal rate, regular rhythm. RESPIRATORY:  Clear to auscultation without rales, wheezing or rhonchi  ABDOMEN: Soft, non-distended EXTREMITIES:  No edema; No deformity    ASSESSMENT AND PLAN:     #Chronic systolic heart failure: Well compensated on exam  today. #Ischemic cardiomyopathy: - Patient meets criteria for primary prevention ICD in the setting of ischemic cardiomyopathy with LVEF less than 35% despite greater than 90 days of optimal medical therapy. Explained risks, benefits, and alternatives to ICD implantation, including but not limited to bleeding, infection, damage to heart or lungs, heart attack, stroke, or death.  Pt verbalized understanding and wants to proceed today.  Plan for [facility] Scientific VVI ICD. -Continue GDMT regimen of empagliflozin  10 mg once daily, metoprolol  XL 50 mg daily, Entresto  24-26 mg twice daily. -Continue follow-up with HF Clinic, Dr. Zenaida.    Follow up with Dr. Kennyth 3 months after ICD implant.    Signed, Fonda Kennyth, MD

## 2024-07-15 NOTE — Discharge Instructions (Signed)
 After Your ICD (Implantable Cardiac Defibrillator)   You have a [employer] ICD  If you have a Medtronic or Biotronik device, plug in your home monitor once you get home, and no manual interaction is required.   If you have an [employer] or [employer] device, plug your home monitor once you get home, sit near the device, and press the large activation button. Sit nearby until the process is complete, usually notated by lights on the monitor.   If you were set up for monitoring using an app on your phone, make sure the app remains open in the background and the Bluetooth remains on.  ACTIVITY Do not lift your arm above shoulder height for 1 week after your procedure. After 7 days, you may progress as below.  You should remove your sling 24 hours after your procedure, unless otherwise instructed by your provider.     Monday July 22, 2024  Tuesday July 23, 2024 Wednesday July 24, 2024 Thursday July 25, 2024   Do not lift, push, pull, or carry anything over 10 pounds with the affected arm until 6 weeks (Monday August 26, 2024 ) after your procedure.   You may drive AFTER your wound check, unless you have been told otherwise by your provider.   Ask your healthcare provider when you can go back to work   INCISION/Dressing If you are on a blood thinner such as Coumadin, Xarelto, Eliquis , Plavix , or Pradaxa please confirm with your provider when this should be resumed.   If large square, outer bandage is left in place, this can be removed after 24 hours from your procedure. Do not remove steri-strips or glue as below.   Monitor your defibrillator site for redness, swelling, and drainage. Call the device clinic at [phone] if you experience these symptoms or fever/chills.  If your incision is sealed with Steri-strips or staples, you may shower 7 days after your procedure or when told by your provider. Do not remove the steri-strips or let the shower hit directly on  your site. You may wash around your site with soap and water.    If you were discharged in a sling, please do not wear this during the day more than 48 hours after your surgery unless otherwise instructed. This may increase the risk of stiffness and soreness in your shoulder.   Avoid lotions, ointments, or perfumes over your incision until it is well-healed.  You may use a hot tub or a pool AFTER your wound check appointment if the incision is completely closed.  Your ICD is designed to protect you from life threatening heart rhythms. Because of this, you may receive a shock.   1 shock with no symptoms:  Call the office during business hours. 1 shock with symptoms (chest pain, chest pressure, dizziness, lightheadedness, shortness of breath, overall feeling unwell):  Call 911. If you experience 2 or more shocks in 24 hours:  Call 911. If you receive a shock, you should not drive for 6 months per the [place] DMV IF you receive appropriate therapy from your ICD.   ICD Alerts:  Some alerts are vibratory and others beep. These are NOT emergencies. Please call our office to let us  know. If this occurs at night or on weekends, it can wait until the next business day. Send a remote transmission.  If your device is capable of reading fluid status (for heart failure), you will be offered monthly monitoring to review this with you.   DEVICE MANAGEMENT Remote  monitoring is used to monitor your ICD from home. This monitoring is scheduled every 91 days by our office. It allows us  to keep an eye on the functioning of your device to ensure it is working properly. You will routinely see your Electrophysiologist annually (more often if necessary). This will appear as a REMOTE check on your MyChart schedule. These are automatic and there is nothing for you to manually do unless otherwise instructed.  You should receive your ID card for your new device in 4-8 weeks. Keep this card with you at all times once received.  Consider wearing a medical alert bracelet or necklace.  Your ICD  may be MRI compatible. This will be discussed at your next office visit/wound check.  You should avoid contact with strong electric or magnetic fields.   Do not use amateur (ham) radio equipment or electric (arc) welding torches. MP3 player headphones with magnets should not be used. Some devices are safe to use if held at least 12 inches (30 cm) from your defibrillator. These include power tools, lawn mowers, and speakers. If you are unsure if something is safe to use, ask your health care provider.  When using your cell phone, hold it to the ear that is on the opposite side from the defibrillator. Do not leave your cell phone in a pocket over the defibrillator.  You may safely use electric blankets, heating pads, computers, and microwave ovens.  Call the office right away if: You have chest pain. You feel more than one shock. You feel more short of breath than you have felt before. You feel more light-headed than you have felt before. Your incision starts to open up.  This information is not intended to replace advice given to you by your health care provider. Make sure you discuss any questions you have with your health care provider.

## 2024-07-16 ENCOUNTER — Encounter: Payer: Self-pay | Admitting: Cardiology

## 2024-07-19 ENCOUNTER — Telehealth: Payer: Self-pay

## 2024-07-19 NOTE — Telephone Encounter (Signed)
 Follow-up after same day discharge: Implant date: 07/15/2024 MD: JP Device: ICD  Location: L Chest    Wound check visit: yes 90 day MD follow-up: yes  Remote Transmission received:yes  Dressing/sling removed: yes  Confirm OAC restart on: yes   Please continue to monitor your cardiac device site for redness, swelling, and drainage. Call the device clinic at [phone] if you experience these symptoms, fever/chills, or have questions about your device.   Remote monitoring is used to monitor your cardiac device from home. This monitoring is scheduled every 91 days by our office. It allows us  to keep an eye on the functioning of your device to ensure it is working properly.

## 2024-07-21 ENCOUNTER — Ambulatory Visit: Payer: Self-pay | Admitting: Cardiology

## 2024-07-22 ENCOUNTER — Other Ambulatory Visit (HOSPITAL_BASED_OUTPATIENT_CLINIC_OR_DEPARTMENT_OTHER): Payer: Self-pay | Admitting: Family

## 2024-07-25 ENCOUNTER — Ambulatory Visit: Attending: Internal Medicine

## 2024-07-25 DIAGNOSIS — I255 Ischemic cardiomyopathy: Secondary | ICD-10-CM

## 2024-07-25 NOTE — Patient Instructions (Signed)

## 2024-07-25 NOTE — Progress Notes (Signed)
 Normal VS 100 chamber ICD wound check. Wound well healed. Presenting rhythm: VS 100. Routine testing performed. Thresholds, sensing, and impedance consistent with implant measurements with 3.5V safety margin/auto capture until 3 month visit. No treated arrhythmias. Reviewed arm restrictions to continue for 6 weeks total post op. Reviewed shock plan.  Pt enrolled in remote follow-up.  Error in saving PDF. See scanned media for printed copy.

## 2024-07-30 ENCOUNTER — Encounter: Payer: Self-pay | Admitting: Internal Medicine

## 2024-08-09 ENCOUNTER — Other Ambulatory Visit: Payer: Self-pay | Admitting: Family Medicine

## 2024-08-09 DIAGNOSIS — R270 Ataxia, unspecified: Secondary | ICD-10-CM

## 2024-08-20 ENCOUNTER — Other Ambulatory Visit: Payer: Self-pay | Admitting: Cardiology

## 2024-08-21 MED ORDER — METFORMIN HCL 500 MG PO TABS: 500.0000 mg | ORAL_TABLET | Freq: Two times a day (BID) | ORAL | 1 refills | Status: DC

## 2024-08-22 ENCOUNTER — Telehealth: Payer: Self-pay

## 2024-08-22 ENCOUNTER — Other Ambulatory Visit: Payer: Self-pay | Admitting: Surgery

## 2024-08-22 DIAGNOSIS — I739 Peripheral vascular disease, unspecified: Secondary | ICD-10-CM

## 2024-08-22 NOTE — Telephone Encounter (Signed)
 Patient called c/o of bilateral foot rest pain.  Patient stating, my feet are beet read and hurt all the time.  ABI and PA appt scheduled.

## 2024-08-27 ENCOUNTER — Ambulatory Visit

## 2024-08-27 DIAGNOSIS — I255 Ischemic cardiomyopathy: Secondary | ICD-10-CM

## 2024-08-27 LAB — CUP PACEART REMOTE DEVICE CHECK
Battery Remaining Longevity: 180 mo
Battery Remaining Percentage: 100 %
Brady Statistic RV Percent Paced: 0 %
Date Time Interrogation Session: 20251104041100
HighPow Impedance: 76 Ohm
Implantable Lead Connection Status: 753985
Implantable Lead Implant Date: 20250922
Implantable Lead Location: 753860
Implantable Lead Model: 673
Implantable Lead Serial Number: 280933
Implantable Pulse Generator Implant Date: 20250922
Lead Channel Impedance Value: 467 Ohm
Lead Channel Pacing Threshold Amplitude: 0.5 V
Lead Channel Pacing Threshold Pulse Width: 0.4 ms
Lead Channel Setting Pacing Amplitude: 3.5 V
Lead Channel Setting Pacing Pulse Width: 0.4 ms
Lead Channel Setting Sensing Sensitivity: 0.5 mV
Pulse Gen Serial Number: 362665
Zone Setting Status: 755011

## 2024-08-31 ENCOUNTER — Other Ambulatory Visit (HOSPITAL_BASED_OUTPATIENT_CLINIC_OR_DEPARTMENT_OTHER): Payer: Self-pay

## 2024-09-02 NOTE — Progress Notes (Signed)
 Remote ICD Transmission

## 2024-09-08 ENCOUNTER — Ambulatory Visit: Payer: Self-pay | Admitting: Cardiology

## 2024-09-11 ENCOUNTER — Other Ambulatory Visit (HOSPITAL_COMMUNITY): Payer: Self-pay | Admitting: Cardiology

## 2024-09-16 ENCOUNTER — Ambulatory Visit

## 2024-09-16 ENCOUNTER — Ambulatory Visit (HOSPITAL_COMMUNITY)

## 2024-09-21 ENCOUNTER — Other Ambulatory Visit (HOSPITAL_BASED_OUTPATIENT_CLINIC_OR_DEPARTMENT_OTHER): Payer: Self-pay | Admitting: Family

## 2024-09-23 ENCOUNTER — Ambulatory Visit (HOSPITAL_COMMUNITY)
Admission: RE | Admit: 2024-09-23 | Discharge: 2024-09-23 | Disposition: A | Source: Ambulatory Visit | Attending: Surgery | Admitting: Surgery

## 2024-09-23 ENCOUNTER — Ambulatory Visit: Admitting: Physician Assistant

## 2024-09-23 VITALS — BP 129/85 | HR 55 | Temp 98.1°F

## 2024-09-23 DIAGNOSIS — I739 Peripheral vascular disease, unspecified: Secondary | ICD-10-CM

## 2024-09-23 DIAGNOSIS — F17200 Nicotine dependence, unspecified, uncomplicated: Secondary | ICD-10-CM | POA: Diagnosis present

## 2024-09-23 LAB — VAS US ABI WITH/WO TBI
Left ABI: 0.55
Right ABI: 0.85

## 2024-09-23 NOTE — Progress Notes (Signed)
 Office Note     CC:  follow up Requesting Provider:  Katina Pfeiffer, PA-C  HPI: Lydell Moga is a 58 y.o. ([birth year]) male who presents for surveillance of PAD.  He was last seen in the office in July of this year.  He has history of left SFA stenting on 08/21/2020 by Dr. Harvey.  He has also had right SFA stenting on 09/04/2020.  This was complicated by hematoma in his left groin requiring primary repair by Dr. Eliza.  He returns to clinic reporting increasing pain in both feet at rest and when standing.  He is mostly nonambulatory however is able to stand to pivot from chair to bed and vice versa.  He does not have any wounds on his feet.  He was prescribed gabapentin  in the past by his rheumatologist however has not been taking this.  He admittedly has not been trying to walk much and has not been doing exercises prescribed by PT.  He smokes about 3 to 4 cigarettes a day.  Past medical history also significant for heart failure with EF of 20%.  He is on statin and Plavix  daily.   Past Medical History:  Diagnosis Date   Anxiety    Arthritis    Ataxia    Diabetes mellitus without complication (HCC)    Gout    Hyperlipemia    Hypertension    Hypothyroidism    Insomnia    Non Hodgkin's lymphoma (HCC)    Pneumonia    Stroke [facility])    Thyroid  disease     Past Surgical History:  Procedure Laterality Date   ABDOMINAL AORTOGRAM W/LOWER EXTREMITY Bilateral 08/21/2020   Procedure: ABDOMINAL AORTOGRAM W/LOWER EXTREMITY;  Surgeon: Harvey Carlin BRAVO, MD;  Location: [facility] IN[facility];  Service: Cardiovascular;  Laterality: Bilateral;   ABDOMINAL AORTOGRAM W/LOWER EXTREMITY N/A 09/04/2020   Procedure: ABDOMINAL AORTOGRAM W/LOWER EXTREMITY;  Surgeon: Harvey Carlin BRAVO, MD;  Location: [facility];  Service: Cardiovascular;  Laterality: N/A;   COLONOSCOPY     x2   FEMORAL ARTERY EXPLORATION Left 09/04/2020   Procedure: FEMORAL ARTERY EXPLORATION;  Surgeon: Eliza Lonni RAMAN, MD;   Location[facility] OR;  Service: Vascular;  Laterality: Left;   ICD IMPLANT N/A 07/15/2024   Procedure: ICD IMPLANT;  Surgeon: Kennyth Chew, MD;  Location: [facility] INVASIVE CV LAB;  Service: Cardiovascular;  Laterality: N/A;   NASAL SINUS SURGERY     PERIPHERAL VASCULAR INTERVENTION Left 08/21/2020   Procedure: PERIPHERAL VASCULAR INTERVENTION;  Surgeon: Harvey Carlin BRAVO, MD;  Location: [facility] INVASIVE CV LAB;  Service: Cardiovascular;  Laterality: Left;  sfa stent x 3   PERIPHERAL VASCULAR INTERVENTION  09/04/2020   Procedure: PERIPHERAL VASCULAR INTERVENTION;  Surgeon: Harvey Carlin BRAVO, MD;  Location: [facility];  Service: Cardiovascular;;  Right SFA   RIGHT/LEFT HEART CATH AND CORONARY ANGIOGRAPHY N/A 11/16/2023   Procedure: RIGHT/LEFT HEART CATH AND CORONARY ANGIOGRAPHY;  Surgeon: Ladona Heinz, MD;  Location: [facility];  Service: Cardiovascular;  Laterality: N/A;   SHOULDER ARTHROSCOPY WITH ROTATOR CUFF REPAIR AND SUBACROMIAL DECOMPRESSION Left 02/04/2021   Procedure: SHOULDER ARTHROSCOPY WITH ROTATOR CUFF REPAIR AND SUBACROMIAL DECOMPRESSION;  Surgeon: Dozier Soulier, MD;  Location: [facility] ORS;  Service: Orthopedics;  Laterality: Left;   WISDOM TOOTH EXTRACTION     WRIST SURGERY     bilat    Social History   Socioeconomic History   Marital status: Married    Spouse name: Not on file   Number of children:  2   Years of education: BS   Highest education level: Bachelor's degree (e.g., BA, AB, BS)  Occupational History   Occupation: nutritional therapist  Tobacco Use   Smoking status: Every Day    Current packs/day: 0.25    Average packs/day: 0.2 packs/day for 41.9 years (10.5 ttl pk-yrs)    Types: Cigarettes    Start date: 1984    Passive exposure: Never   Smokeless tobacco: Former  Building Services Engineer status: Never Used  Substance and Sexual Activity   Alcohol use: Not Currently   Drug use: No   Sexual activity: Not on file  Other Topics Concern   Not on file  Social  History Narrative   Married, lives with wife and two teenage children. He and his sister alternate with their parents living with them every other month. His parents are in their upper 80's. He drinks 4-5 cups of coffee a day and 2-3 sodas or glasses of tea. He does not get any regular exercise. Right handed   One story home   Social Drivers of Health   Financial Resource Strain: Not on file  Food Insecurity: No Food Insecurity (12/20/2023)   Hunger Vital Sign    Worried About Running Out of Food in the Last Year: Never true    Ran Out of Food in the Last Year: Never true  Transportation Needs: No Transportation Needs (12/20/2023)   PRAPARE - Administrator, Civil Service (Medical): No    Lack of Transportation (Non-Medical): No  Physical Activity: Not on file  Stress: Not on file  Social Connections: Moderately Integrated (12/20/2023)   Social Connection and Isolation Panel    Frequency of Communication with Friends and Family: More than three times a week    Frequency of Social Gatherings with Friends and Family: Three times a week    Attends Religious Services: 1 to 4 times per year    Active Member of Clubs or Organizations: No    Attends Banker Meetings: Never    Marital Status: Married  Catering Manager Violence: Not At Risk (12/20/2023)   Humiliation, Afraid, Rape, and Kick questionnaire    Fear of Current or Ex-Partner: No    Emotionally Abused: No    Physically Abused: No    Sexually Abused: No    Family History  Problem Relation Age of Onset   Colon cancer Father    Lung cancer Father    Atrial fibrillation Mother    Autoimmune disease Sister    Healthy Daughter    Healthy Son     Current Outpatient Medications  Medication Sig Dispense Refill   acetaminophen  (TYLENOL ) 500 MG tablet Take 500-1,000 mg by mouth every 6 (six) hours as needed (for pain).     clopidogrel  (PLAVIX ) 75 MG tablet Take 1 tablet (75 mg total) by mouth daily. 30 tablet  11   empagliflozin  (JARDIANCE ) 10 MG TABS tablet Take 1 tablet (10 mg total) by mouth daily before breakfast. 90 tablet 3   Febuxostat  80 MG TABS Take 80 mg by mouth every evening.     furosemide  (LASIX ) 20 MG tablet TAKE 1 TABLET BY MOUTH FOR 3 DAYS AS NEEDED FOR WEIGHT GAIN OF 2 LBS IN 24 HOURS OR 5 LBS IN 1 WEEK 30 tablet 1   gabapentin  (NEURONTIN ) 100 MG capsule Take 100-300 mg by mouth at bedtime as needed (nerve pain).     HYDROcodone -acetaminophen  (NORCO) 10-325 MG tablet Take 1 tablet by  mouth every 6 (six) hours as needed for severe pain (pain score 7-10).     levothyroxine  (SYNTHROID , LEVOTHROID) 200 MCG tablet Take 200 mcg by mouth daily before breakfast.     metFORMIN  (GLUCOPHAGE ) 500 MG tablet Take 1 tablet (500 mg total) by mouth 2 (two) times daily with a meal. 60 tablet 1   metoprolol  succinate (TOPROL -XL) 50 MG 24 hr tablet TAKE 1 TABLET BY MOUTH DAILY 90 tablet 1   naloxone  (NARCAN ) nasal spray 4 mg/0.1 mL Use in case of overdose 2 each 0   nicotine  polacrilex (NICORETTE ) 4 MG gum Take 4 mg by mouth as needed for smoking cessation.     phenylephrine  (4-WAY FAST ACTING) 1 % nasal spray Place 1 drop into both nostrils every 6 (six) hours as needed for congestion.     rosuvastatin  (CRESTOR ) 20 MG tablet Take 1 tablet (20 mg total) by mouth daily. 90 tablet 3   sacubitril -valsartan  (ENTRESTO ) 49-51 MG Take 1 tablet by mouth 2 (two) times daily. 180 tablet 3   No current facility-administered medications for this visit.    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; it makes me bleed and caused abdominal pain -Pt can take in small amounts(??)    Ibuprofen Other (See Comments)    CKD   Nsaids Other (See Comments)    Avoids due to kidney issues   Avelox [Moxifloxacin Hcl In Nacl] Nausea And Vomiting and Other (See Comments)    made me really sick   Colchicine Diarrhea   Gabapentin  Other (See Comments)     Affected balance- no longer takes this   Latex Other (See Comments)    Irritates the skin   Penicillins Other (See Comments)    Severe GI upset- occurred in childhood     REVIEW OF SYSTEMS:  Negative unless noted in HPI [X]  denotes positive finding, [ ]  denotes negative finding Cardiac  Comments:  Chest pain or chest pressure:    Shortness of breath upon exertion:    Short of breath when lying flat:    Irregular heart rhythm:        Vascular    Pain in calf, thigh, or hip brought on by ambulation:    Pain in feet at night that wakes you up from your sleep:     Blood clot in your veins:    Leg swelling:         Pulmonary    Oxygen at home:    Productive cough:     Wheezing:         Neurologic    Sudden weakness in arms or legs:     Sudden numbness in arms or legs:     Sudden onset of difficulty speaking or slurred speech:    Temporary loss of vision in one eye:     Problems with dizziness:         Gastrointestinal    Blood in stool:     Vomited blood:         Genitourinary    Burning when urinating:     Blood in urine:        Psychiatric    Major depression:         Hematologic    Bleeding problems:    Problems with blood clotting too easily:        Skin    Rashes or ulcers:  Constitutional    Fever or chills:      PHYSICAL EXAMINATION:  Vitals:   09/23/24 1506  BP: 129/85  Pulse: (!) 55  Temp: 98.1 F (36.7 C)  TempSrc: Temporal    General:  WDWN in NAD; vital signs documented above Gait: Not observed HENT: WNL, normocephalic Pulmonary: normal non-labored breathing Cardiac: regular HR Abdomen: soft, NT, no masses Skin: without rashes Vascular Exam/Pulses: PT signals by Doppler bilaterally Extremities: without ischemic changes, without Gangrene , without cellulitis; without open wounds;  Musculoskeletal: no muscle wasting or atrophy  Neurologic: A&O X 3 Psychiatric:  The pt has Normal affect.   Non-Invasive Vascular Imaging:     ABI/TBIToday's ABIToday's TBIPrevious ABIPrevious TBI  +-------+-----------+-----------+------------+------------+  Right 0.85       0          0.99        1.07          +-------+-----------+-----------+------------+------------+  Left  0.55       0          0.58        0             +-------+-----------+-----------+------------+-----------     ASSESSMENT/PLAN:: 58 y.o. male here for follow up for surveillance of PAD with increasing pain in his feet  Mr. Citro is a 58 year old male with history of bilateral SFA stenting.  He is nonambulatory however uses his legs to stand to pivot and can walk with assistance.  He does not have any open wounds of bilateral lower extremities.  TBI's have decreased since July however his ABIs are relatively stable.  Given his complex medical history we will attempt to proceed conservatively.  He will discuss gabapentin  use with his dermatologist tomorrow since he has not been taking this medication.  I encouraged him to work on exercises prescribed to him by PT to promote collateral flow.  We will recheck ABIs and bilateral lower extremity arterial duplex in 3 months.  If he develops wounds or if pain in his feet worsens especially overnight we will proceed with angiography.  It should also be noted that I encouraged complete smoking cessation.   Donnice Sender, PA-C [facility] [phone]  Clinic MD:   Serene

## 2024-09-26 ENCOUNTER — Other Ambulatory Visit: Payer: Self-pay | Admitting: *Deleted

## 2024-09-26 DIAGNOSIS — I739 Peripheral vascular disease, unspecified: Secondary | ICD-10-CM

## 2024-09-30 ENCOUNTER — Telehealth: Payer: Self-pay | Admitting: Cardiology

## 2024-09-30 NOTE — Telephone Encounter (Signed)
 Spoke with patient who stated he was willing to take some risks, is wheelchair/bed bound   Will forward Dr Lonni and Pharm D team for review

## 2024-09-30 NOTE — Telephone Encounter (Signed)
 Pt c/o medication issue:  1. Name of Medication: dalfampridine   2. How are you currently taking this medication (dosage and times per day)? Twice a day.   3. Are you having a reaction (difficulty breathing--STAT)?   4. What is your medication issue?  Patient wants to know if it's okay to take this medication.

## 2024-09-30 NOTE — Telephone Encounter (Signed)
 Wife Rick) called to follow-up on if it would be OK for patient to take Dalfampridine medication as prescribed by patient's Neurologist.

## 2024-09-30 NOTE — Telephone Encounter (Signed)
 No drug drug interactions and no cardio contraindications

## 2024-09-30 NOTE — Telephone Encounter (Signed)
 Advised wife, ok pr DPR   Will forward to Deepal Christinia Jeans, MD

## 2024-10-02 ENCOUNTER — Encounter (HOSPITAL_BASED_OUTPATIENT_CLINIC_OR_DEPARTMENT_OTHER): Payer: Self-pay | Admitting: Cardiology

## 2024-10-15 ENCOUNTER — Ambulatory Visit

## 2024-10-15 ENCOUNTER — Encounter: Payer: Self-pay | Admitting: Cardiology

## 2024-10-15 ENCOUNTER — Ambulatory Visit: Attending: Physician Assistant | Admitting: Cardiology

## 2024-10-15 VITALS — BP 112/74 | HR 101 | Ht 73.0 in | Wt 217.0 lb

## 2024-10-15 DIAGNOSIS — I493 Ventricular premature depolarization: Secondary | ICD-10-CM

## 2024-10-15 DIAGNOSIS — I5042 Chronic combined systolic (congestive) and diastolic (congestive) heart failure: Secondary | ICD-10-CM | POA: Diagnosis not present

## 2024-10-15 DIAGNOSIS — I255 Ischemic cardiomyopathy: Secondary | ICD-10-CM

## 2024-10-15 DIAGNOSIS — I5022 Chronic systolic (congestive) heart failure: Secondary | ICD-10-CM

## 2024-10-15 LAB — CUP PACEART INCLINIC DEVICE CHECK
Date Time Interrogation Session: 20251223143551
Implantable Lead Connection Status: 753985
Implantable Lead Implant Date: 20250922
Implantable Lead Location: 753860
Implantable Lead Model: 673
Implantable Lead Serial Number: 280933
Implantable Pulse Generator Implant Date: 20250922
Lead Channel Setting Pacing Amplitude: 2.5 V
Lead Channel Setting Pacing Pulse Width: 0.4 ms
Lead Channel Setting Sensing Sensitivity: 0.5 mV
Pulse Gen Serial Number: 362665
Zone Setting Status: 755011

## 2024-10-15 MED ORDER — METOPROLOL SUCCINATE ER 50 MG PO TB24: 50.0000 mg | ORAL_TABLET | Freq: Two times a day (BID) | ORAL | 3 refills | Status: AC

## 2024-10-15 NOTE — Progress Notes (Unsigned)
 Enrolled patient for a 3 day Zio XT monitor to be mailed to patients home   Dustin Sampson to read

## 2024-10-15 NOTE — Progress Notes (Signed)
" °  Electrophysiology Office Note:   ID:  Dustin Sampson, DOB 03-Jan-1966, MRN [id number]  Primary Cardiologist: Shelda Bruckner, MD Electrophysiologist: Fonda Kitty, MD      History of Present Illness:   Dustin Sampson is a 58 y.o. male with h/o CAD, hypertension, hyperlipidemia, DM2, hypothyroidism, PAD s/p bilateral SFA stenting 08/2020, tobacco use, prior CVA, MGUS, PVC, HFrEF, CKD 2A, large B-cell lymphoma seen today for routine electrophysiology follow-up s/p Defibrillator implant.  Patient referred to EP/seen by Dr. Kitty in August of this year for evaluation of ICD implantation with LVEF 20%. Patient met criteria for primary prevention ICD with ischemic cardiomyopathy and had single chamber [employer] ICD implanted on 07/15/24.  Since his ICD implant, the patient reports doing well.  he denies chest pain, palpitations, dyspnea, PND, orthopnea, nausea, vomiting, dizziness, syncope, edema, weight gain, or early satiety.    Review of systems complete and found to be negative unless listed in HPI.   EP Information / Studies Reviewed:    EKG is ordered today. Personal review as below.  EKG Interpretation Date/Time:  Tuesday October 15 2024 13:33:30 EST Ventricular Rate:  101 PR Interval:  148 QRS Duration:  96 QT Interval:  352 QTC Calculation: 456 R Axis:   -24  Text Interpretation: Sinus tachycardia When compared with ECG of 15-Jul-2024 10:06, PREVIOUS ECG IS PRESENT Confirmed by Trudy Birmingham [phone]) on 10/15/2024 1:34:51 PM    ICD Interrogation-  reviewed in detail today,  See PACEART report.  Arrhythmia/Device History Single chamber ICD BSX JP    Implant Name Type Inv. Item Serial No. Manufacturer Lot No. LRB No. Used Action  LEAD RELIANCE 0673 - [id number]  LEAD RELIANCE [id number] [employer]  N/A 1 Implanted  ICD VIGILANT VR D232 - [id number] Pacemaker ICD VIGILANT VR D232 [id number] [employer]  Left 1 Implanted     Physical Exam:    VS:  BP 112/74   Pulse (!) 101   Ht 6' 1 (1.854 m)   Wt 217 lb (98.4 kg)   SpO2 97%   BMI 28.63 kg/m    Wt Readings from Last 3 Encounters:  10/15/24 217 lb (98.4 kg)  07/15/24 215 lb (97.5 kg)  06/11/24 215 lb (97.5 kg)     GEN: No acute distress  NECK: No JVD; No carotid bruits CARDIAC: Regular rate and rhythm, no murmurs, rubs, gallops RESPIRATORY:  Clear to auscultation without rales, wheezing or rhonchi  ABDOMEN: Soft, non-tender, non-distended EXTREMITIES:  No edema; No deformity   ASSESSMENT AND PLAN:    Chronic systolic CHF  s/p [employer] single chamber ICD  Euvolemic appearing today Incision/pocket well appearing with no redness or swelling. Stable on appropriate GDMT per AHF team. Normal ICD function Ventricular output reprogrammed from nominal to chronic. See PaceArt report for details.  NSVT PVCs Intermittent PVCs seen during device interrogation today. Device also notes 5 episodes of NSVT, longest 17 seconds. No symptoms of palpitations per patient. Will increase his Metoprolol  Succinate to 50mg  BID and have him wear a 3 day Zio to assess PVC burden.   Hypertension Blood pressure is well-controlled on current antihypertensive regimen. Continue current medications and dosing.   Disposition:   Follow up with EP Team in 3 months   Signed, [place] Trudy, PA-C  "

## 2024-10-15 NOTE — Patient Instructions (Addendum)
 Medication Instructions:   START TAKING : METOPROLOL  50  MG TWICE  A  DAY    *If you need a refill on your cardiac medications before your next appointment, please call your pharmacy*    Lab Work:  NONE ORDERED  TODAY    If you have labs (blood work) drawn today and your tests are completely normal, you will receive your results only by: MyChart Message (if you have MyChart) OR A paper copy in the mail If you have any lab test that is abnormal or we need to change your treatment, we will call you to review the results.   Testing/Procedures: Your physician has recommended that you wear an event monitor. Event monitors are medical devices that record the hearts electrical activity. Doctors most often us  these monitors to diagnose arrhythmias. Arrhythmias are problems with the speed or rhythm of the heartbeat. The monitor is a small, portable device. You can wear one while you do your normal daily activities. This is usually used to diagnose what is causing palpitations/syncope (passing out).    Follow-Up: At [facility], you and your health needs are our priority.  As part of our continuing mission to provide you with exceptional heart care, our providers are all part of one team.  This team includes your primary Cardiologist (physician) and Advanced Practice Providers or APPs (Physician Assistants and Nurse Practitioners) who all work together to provide you with the care you need, when you need it.  Your next appointment:  3 month(s)  Provider:    You will see one of the following Advanced Practice Providers on your designated Care Team:   Charlies Arthur, [place] Ozell Jodie Passey, PA-C Artist Pouch, PA-C  ( CONTACT  CASSIE HALL/ ANGELINE HAMMER FOR EP SCHEDULING ISSUES )   We recommend signing up for the patient portal called MyChart.  Sign up information is provided on this After Visit Summary.  MyChart is used to connect with patients for Virtual Visits (Telemedicine).   Patients are able to view lab/test results, encounter notes, upcoming appointments, etc.  Non-urgent messages can be sent to your provider as well.   To learn more about what you can do with MyChart, go to [email or web address].   Other Instructions  ZIO XT- Long Term Monitor Instructions  Your physician has requested you wear a ZIO patch monitor for 3  days.  This is a single patch monitor. [employer] supplies one patch monitor per enrollment. Additional stickers are not available. Please do not apply patch if you will be having a Nuclear Stress Test,  Echocardiogram, Cardiac CT, MRI, or Chest Xray during the period you would be wearing the  monitor. The patch cannot be worn during these tests. You cannot remove and re-apply the  ZIO XT patch monitor.  Your ZIO patch monitor will be mailed 3 day USPS to your address on file. It may take 3-5 days  to receive your monitor after you have been enrolled.  Once you have received your monitor, please review the enclosed instructions. Your monitor  has already been registered assigning a specific monitor serial # to you.  Billing and Patient Assistance Program Information  We have supplied [employer] with any of your insurance information on file for billing purposes. [employer] offers a sliding scale Patient Assistance Program for patients that do not have  insurance, or whose insurance does not completely cover the cost of the ZIO monitor.  You must apply for the Patient Assistance Program to qualify for this  discounted rate.  To apply, please call [employer] at [phone], select option 4, select option 2, ask to apply for  Patient Assistance Program. Meredeth will ask your household income, and how many people  are in your household. They will quote your out-of-pocket cost based on that information.  [employer] will also be able to set up a 81-month, interest-free payment plan if needed.  Applying the monitor   Shave hair from upper left chest.   Hold abrader disc by orange tab. Rub abrader in 40 strokes over the upper left chest as  indicated in your monitor instructions.  Clean area with 4 enclosed alcohol pads. Let dry.  Apply patch as indicated in monitor instructions. Patch will be placed under collarbone on left  side of chest with arrow pointing upward.  Rub patch adhesive wings for 2 minutes. Remove white label marked 1. Remove the white  label marked 2. Rub patch adhesive wings for 2 additional minutes.  While looking in a mirror, press and release button in center of patch. A small green light will  flash 3-4 times. This will be your only indicator that the monitor has been turned on.  Do not shower for the first 24 hours. You may shower after the first 24 hours.  Press the button if you feel a symptom. You will hear a small click. Record Date, Time and  Symptom in the Patient Logbook.  When you are ready to remove the patch, follow instructions on the last 2 pages of Patient  Logbook. Stick patch monitor onto the last page of Patient Logbook.  Place Patient Logbook in the blue and white box. Use locking tab on box and tape box closed  securely. The blue and white box has prepaid postage on it. Please place it in the mailbox as  soon as possible. Your physician should have your test results approximately 7 days after the  monitor has been mailed back to [facility].  Call [facility] Customer Care at [phone] if you have questions regarding  your ZIO XT patch monitor. Call them immediately if you see an orange light blinking on your  monitor.  If your monitor falls off in less than 4 days, contact our Monitor department at [phone].  If your monitor becomes loose or falls off after 4 days call [employer] at [phone] for  suggestions on securing your monitor

## 2024-10-19 ENCOUNTER — Other Ambulatory Visit: Payer: Self-pay | Admitting: Cardiology

## 2024-10-19 ENCOUNTER — Ambulatory Visit: Payer: Self-pay | Admitting: Cardiology

## 2024-10-22 ENCOUNTER — Other Ambulatory Visit (HOSPITAL_BASED_OUTPATIENT_CLINIC_OR_DEPARTMENT_OTHER): Payer: Self-pay

## 2024-11-01 ENCOUNTER — Other Ambulatory Visit: Payer: Self-pay | Admitting: Cardiology

## 2024-11-01 ENCOUNTER — Other Ambulatory Visit: Payer: Self-pay | Admitting: Surgery

## 2024-11-07 ENCOUNTER — Other Ambulatory Visit (HOSPITAL_COMMUNITY): Payer: Self-pay

## 2024-11-07 ENCOUNTER — Ambulatory Visit (HOSPITAL_COMMUNITY)
Admission: RE | Admit: 2024-11-07 | Discharge: 2024-11-07 | Disposition: A | Discharge status: Home / Self Care | Source: Ambulatory Visit | Attending: Cardiology | Admitting: Cardiology

## 2024-11-07 ENCOUNTER — Telehealth (HOSPITAL_COMMUNITY): Payer: Self-pay

## 2024-11-07 ENCOUNTER — Encounter (HOSPITAL_COMMUNITY): Payer: Self-pay | Admitting: Cardiology

## 2024-11-07 VITALS — BP 110/70 | HR 52 | Ht 73.0 in | Wt 209.4 lb

## 2024-11-07 DIAGNOSIS — I255 Ischemic cardiomyopathy: Secondary | ICD-10-CM | POA: Diagnosis not present

## 2024-11-07 DIAGNOSIS — E781 Pure hyperglyceridemia: Secondary | ICD-10-CM | POA: Diagnosis not present

## 2024-11-07 DIAGNOSIS — I5022 Chronic systolic (congestive) heart failure: Secondary | ICD-10-CM | POA: Insufficient documentation

## 2024-11-07 DIAGNOSIS — D472 Monoclonal gammopathy: Secondary | ICD-10-CM | POA: Diagnosis not present

## 2024-11-07 DIAGNOSIS — I130 Hypertensive heart and chronic kidney disease with heart failure and stage 1 through stage 4 chronic kidney disease, or unspecified chronic kidney disease: Secondary | ICD-10-CM | POA: Insufficient documentation

## 2024-11-07 DIAGNOSIS — N182 Chronic kidney disease, stage 2 (mild): Secondary | ICD-10-CM | POA: Insufficient documentation

## 2024-11-07 DIAGNOSIS — I10 Essential (primary) hypertension: Secondary | ICD-10-CM | POA: Diagnosis not present

## 2024-11-07 DIAGNOSIS — Z79899 Other long term (current) drug therapy: Secondary | ICD-10-CM | POA: Insufficient documentation

## 2024-11-07 DIAGNOSIS — Z95820 Peripheral vascular angioplasty status with implants and grafts: Secondary | ICD-10-CM | POA: Insufficient documentation

## 2024-11-07 DIAGNOSIS — R339 Retention of urine, unspecified: Secondary | ICD-10-CM | POA: Diagnosis not present

## 2024-11-07 DIAGNOSIS — Z8673 Personal history of transient ischemic attack (TIA), and cerebral infarction without residual deficits: Secondary | ICD-10-CM | POA: Insufficient documentation

## 2024-11-07 DIAGNOSIS — R270 Ataxia, unspecified: Secondary | ICD-10-CM | POA: Insufficient documentation

## 2024-11-07 DIAGNOSIS — I493 Ventricular premature depolarization: Secondary | ICD-10-CM | POA: Diagnosis not present

## 2024-11-07 DIAGNOSIS — I2510 Atherosclerotic heart disease of native coronary artery without angina pectoris: Secondary | ICD-10-CM | POA: Diagnosis not present

## 2024-11-07 DIAGNOSIS — Z8572 Personal history of non-Hodgkin lymphomas: Secondary | ICD-10-CM | POA: Insufficient documentation

## 2024-11-07 DIAGNOSIS — E039 Hypothyroidism, unspecified: Secondary | ICD-10-CM | POA: Diagnosis not present

## 2024-11-07 DIAGNOSIS — E1151 Type 2 diabetes mellitus with diabetic peripheral angiopathy without gangrene: Secondary | ICD-10-CM | POA: Diagnosis not present

## 2024-11-07 DIAGNOSIS — E1122 Type 2 diabetes mellitus with diabetic chronic kidney disease: Secondary | ICD-10-CM | POA: Insufficient documentation

## 2024-11-07 DIAGNOSIS — E785 Hyperlipidemia, unspecified: Secondary | ICD-10-CM | POA: Diagnosis not present

## 2024-11-07 MED ORDER — TAMSULOSIN HCL 0.4 MG PO CAPS: 0.4000 mg | ORAL_CAPSULE | Freq: Every day | ORAL | 3 refills | Status: AC

## 2024-11-07 MED ORDER — VASCEPA 1 G PO CAPS: 2.0000 g | ORAL_CAPSULE | Freq: Two times a day (BID) | ORAL | 11 refills | Status: AC

## 2024-11-07 NOTE — Patient Instructions (Addendum)
 START Flomax  1 tablet daily   START Vascepa  2 g Twice daily  Your physician recommends that you schedule a follow-up appointment in: 6 months ( July) ** PLEASE CALL THE OFFICE IN MAY TO ARRANGE YOUR FOLLOW UP APPOINTMENT.**  If you have any questions or concerns before your next appointment please send us  a message through [place] or call our office at [phone].    TO LEAVE A MESSAGE FOR THE NURSE SELECT OPTION 2, PLEASE LEAVE A MESSAGE INCLUDING: YOUR NAME DATE OF BIRTH CALL BACK NUMBER REASON FOR CALL**this is important as we prioritize the call backs  YOU WILL RECEIVE A CALL BACK THE SAME DAY AS LONG AS YOU CALL BEFORE 4:00 PM  At the [facility], you and your health needs are our priority. As part of our continuing mission to provide you with exceptional heart care, we have created designated Provider Care Teams. These Care Teams include your primary Cardiologist (physician) and Advanced Practice Providers (APPs- Physician Assistants and Nurse Practitioners) who all work together to provide you with the care you need, when you need it.   You may see any of the following providers on your designated Care Team at your next follow up: Dr Toribio Fuel Dr Ezra Shuck Dr. Morene Brownie Greig Mosses, NP Caffie Shed, [place] [facility] [place], [place] Beckey Coe, NP Jordan Lee, NP Ellouise Class, NP Tinnie Redman, PharmD Jaun Bash, PharmD   Please be sure to bring in all your medications bottles to every appointment.    Thank you for choosing [facility]

## 2024-11-07 NOTE — Telephone Encounter (Signed)
 Advanced [facility] Failure Patient Advocate Encounter  Prior authorization for Vascepa  has been submitted and approved. Test billing returns $25 for 30 day supply.  Key: [id number] Effective: 11/07/2024 to 11/08/2027  Rachel DEL, CPhT Rx Patient Advocate Phone: [phone]

## 2024-11-07 NOTE — Progress Notes (Signed)
 "  ADVANCED HEART FAILURE FOLLOW UP CLINIC NOTE  Referring Physician: Katina Pfeiffer, PA-C  Primary Care: Katina Pfeiffer, PA-C Primary Cardiologist:  HPI: Dustin Sampson is a 59 y.o. male with a PMH of hypertension, hyperlipidemia, DM2, hypothyroidism, PAD s/p bilateral SFA stenting 08/2020, tobacco use, prior CVA, MGUS, PVC, HFrEF, CKD 2A, CAD, large B-cell lymphoma  who presents for initial visit for further evaluation and treatment of heart failure/cardiomyopathy.      Patient has a prior history of CAD, presented 10/2023 with heart failure exacerbation, occluded RCA noted and ulcerated OM1, appears out of proportion to his known cardiomyopathy.  Patient has a known history of ataxia, father also has a similar history and appears to have correlated around the time of his B-cell lymphoma treatment.  Reports that when his heart failure symptoms flare his ataxia gets much worse.     SUBJECTIVE:  Patient overall feels fairly well after his medication adjustments last time, has tolerated the increase in Entresto  well.  ICD was placed recently, tolerated well.  His interrogation was reviewed, low RV pacing burden, no evidence of NSVT.  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  Would like some medication for urinary retention, has not been on Flomax  in the past.  Was recently started on a medication for elevated triglycerides by his ?Pain doctor but does not recall the name of the medication and has not started taking it.  PMH, current medications, allergies, social history, and family history reviewed in epic.  PHYSICAL EXAM: Vitals:   11/07/24 1532  BP: 110/70  Pulse: (!) 52  SpO2: 97%    GENERAL: NAD, fair appearing PULM:  Normal work of breathing, CTAB CARDIAC:  JVP: flat         Normal rate with regular rhythm. No murmurs, rubs or gallops.  No edema. Warm and well perfused  extremities. ABDOMEN: Soft, non-tender, non-distended. NEUROLOGIC: Patient is oriented x3 with no focal or lateralizing neurologic deficits.     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 Poor anticoagulation candidate given multiple falls.  ICD placed, CMR would not of change management. 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 -Father had similar symptoms - Continue neurology follow-up  Urinary retention: - Start Flomax  0.4  mg daily  Hypertriglyceridemia: - Given CAD and elevated risk start vascepa  2 g twice daily  Follow-up in 6 months  Morene Brownie, MD Advanced Heart Failure Mechanical Circulatory Support 11/08/24  "

## 2024-11-08 NOTE — Addendum Note (Signed)
 Encounter addended by: Zenaida Morene PARAS, MD on: 11/08/2024 8:43 AM  Actions taken: Charge Capture section accepted

## 2024-11-11 DIAGNOSIS — I493 Ventricular premature depolarization: Secondary | ICD-10-CM

## 2024-11-18 ENCOUNTER — Other Ambulatory Visit (HOSPITAL_BASED_OUTPATIENT_CLINIC_OR_DEPARTMENT_OTHER): Payer: Self-pay | Admitting: Family

## 2024-11-20 ENCOUNTER — Other Ambulatory Visit: Payer: Self-pay | Admitting: Family

## 2024-11-22 NOTE — Telephone Encounter (Signed)
 In accordance with refill protocols, please review and address the following requirements before this medication refill can be authorized:  Labs

## 2024-11-26 ENCOUNTER — Ambulatory Visit

## 2024-11-27 MED ORDER — FUROSEMIDE 20 MG PO TABS: ORAL_TABLET | ORAL | 1 refills | Status: AC

## 2024-11-27 NOTE — Addendum Note (Signed)
 Addended by: Drexel Ivey S on: 11/27/2024 08:10 PM   Modules accepted: Orders

## 2024-11-28 ENCOUNTER — Telehealth: Payer: Self-pay | Admitting: Cardiology

## 2024-11-28 NOTE — Telephone Encounter (Signed)
 Patient was called regarding missed remote transmission.

## 2024-11-29 LAB — CUP PACEART REMOTE DEVICE CHECK
Battery Remaining Longevity: 180 mo
Battery Remaining Percentage: 100 %
Brady Statistic RV Percent Paced: 0 %
Date Time Interrogation Session: 20260205163100
HighPow Impedance: 83 Ohm
Implantable Lead Connection Status: 753985
Implantable Lead Implant Date: 20250922
Implantable Lead Location: 753860
Implantable Lead Model: 673
Implantable Lead Serial Number: 280933
Implantable Pulse Generator Implant Date: 20250922
Lead Channel Impedance Value: 478 Ohm
Lead Channel Pacing Threshold Amplitude: 0.4 V
Lead Channel Pacing Threshold Pulse Width: 0.4 ms
Lead Channel Setting Pacing Amplitude: 2.5 V
Lead Channel Setting Pacing Pulse Width: 0.4 ms
Lead Channel Setting Sensing Sensitivity: 0.5 mV
Pulse Gen Serial Number: 362665
Zone Setting Status: 755011

## 2024-12-23 ENCOUNTER — Ambulatory Visit (HOSPITAL_COMMUNITY)

## 2024-12-23 ENCOUNTER — Ambulatory Visit

## 2025-01-06 ENCOUNTER — Ambulatory Visit (HOSPITAL_COMMUNITY)

## 2025-01-06 ENCOUNTER — Ambulatory Visit

## 2025-01-13 ENCOUNTER — Ambulatory Visit: Admitting: Physician Assistant

## 2025-02-25 ENCOUNTER — Encounter

## 2025-05-27 ENCOUNTER — Encounter

## 2025-08-26 ENCOUNTER — Encounter
